# Patient Record
Sex: Female | Born: 1980 | Race: White | Hispanic: No
Health system: Midwestern US, Community
[De-identification: ages and names within clinical notes are randomized; demographics above are authoritative.]

## PROBLEM LIST (undated history)

## (undated) DIAGNOSIS — F329 Major depressive disorder, single episode, unspecified: Secondary | ICD-10-CM

## (undated) DIAGNOSIS — F1910 Other psychoactive substance abuse, uncomplicated: Secondary | ICD-10-CM

## (undated) DIAGNOSIS — F32A Depression, unspecified: Secondary | ICD-10-CM

## (undated) DIAGNOSIS — D649 Anemia, unspecified: Secondary | ICD-10-CM

## (undated) DIAGNOSIS — O034 Incomplete spontaneous abortion without complication: Secondary | ICD-10-CM

## (undated) HISTORY — PX: GASTRIC BYPASS: SHX52

---
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
## 2015-11-20 ENCOUNTER — Observation Stay
Admit: 2015-11-20 | Discharge: 2015-11-21 | Disposition: A | Discharge status: Home / Self Care | Payer: MEDICARE | Attending: Internal Medicine | Admitting: Internal Medicine

## 2015-11-20 ENCOUNTER — Emergency Department: Admit: 2015-11-21 | Payer: MEDICARE

## 2015-11-20 DIAGNOSIS — L03113 Cellulitis of right upper limb: Secondary | ICD-10-CM

## 2015-11-20 NOTE — ED Provider Notes (Signed)
Patient is a 35 y.o. female presenting with skin problem and wrist pain. The history is provided by the patient.   Skin Problem   This is a new problem. The current episode started 2 days ago. The problem occurs constantly. The problem has been gradually worsening. Pertinent negatives include no chest pain, no abdominal pain, no headaches and no shortness of breath. The symptoms are aggravated by bending and twisting. Nothing (not relieved by NSAIDS) relieves the symptoms. The treatment provided no (not improved with NSAIDS) relief.   Wrist Pain    Pertinent negatives include no back pain.        Past Medical History:   Diagnosis Date   ??? Anemia    ??? Fibromyalgia    ??? Lupus ([place])        History reviewed. No pertinent surgical history.      History reviewed. No pertinent family history.    Social History     Social History   ??? Marital status: DIVORCED     Spouse name: N/A   ??? Number of children: N/A   ??? Years of education: N/A     Occupational History   ??? Not on file.     Social History Main Topics   ??? Smoking status: Current Every Day Smoker     Packs/day: 1.00   ??? Smokeless tobacco: Not on file   ??? Alcohol use No   ??? Drug use: No   ??? Sexual activity: Not on file     Other Topics Concern   ??? Not on file     Social History Narrative   ??? No narrative on file         ALLERGIES: Review of patient's allergies indicates no known allergies.    Review of Systems   Constitutional: Negative for chills, fatigue and fever.   HENT: Negative for congestion and sore throat.    Eyes: Negative for photophobia and visual disturbance.   Respiratory: Negative for cough and shortness of breath.    Cardiovascular: Negative for chest pain and palpitations.   Gastrointestinal: Negative for abdominal pain and vomiting.   Endocrine: Negative for cold intolerance and polyuria.   Genitourinary: Negative for dysuria, frequency and hematuria.   Musculoskeletal: Negative for back pain and myalgias.    Skin: Positive for color change. Negative for rash.        Redness , swelling  of right wrist with tenderness   Allergic/Immunologic: Negative for environmental allergies and food allergies.   Neurological: Negative for dizziness, syncope and headaches.   Psychiatric/Behavioral: Negative for hallucinations and suicidal ideas.       Vitals:    11/20/15 1859 11/20/15 1904   BP:  124/87   Pulse:  85   Resp:  18   Temp:  98.1 ??F (36.7 ??C)   Weight: 78.6 kg (173 lb 3.2 oz)    Height: 5' 4"  (1.626 m)             Physical Exam   Constitutional: She is oriented to person, place, and time. She appears well-developed and well-nourished. She appears distressed.   Mild distress due to pain of wrist   HENT:   Head: Normocephalic and atraumatic.   Eyes: Conjunctivae and EOM are normal. Pupils are equal, round, and reactive to light.   Neck: Normal range of motion. Neck supple.   Cardiovascular: Normal rate, regular rhythm and normal heart sounds.    Pulmonary/Chest: Effort normal and breath sounds normal. No respiratory distress. She  has no rales.   Abdominal: Soft. She exhibits no distension. There is no tenderness.   Musculoskeletal: Normal range of motion. She exhibits edema and tenderness.   Swelling of right wrist with tenderness, also involving the forearm.    Right fingers somewhat cold.    Neurological: She is alert and oriented to person, place, and time.   Skin: Skin is warm and dry. There is erythema.        Erythema of wrist and forearm with swelling and tenderness   Psychiatric: She has a normal mood and affect. Thought content normal.   Nursing note and vitals reviewed.       MDM  Number of Diagnoses or Management Options  Cellulitis of hand, right:     ED Course       Xr Wrist Rt Ap/lat/obl Min 3v    Result Date: 11/20/2015  EXAM:   XR Right Wrist Complete, 3 or More Views. CLINICAL HISTORY:   35 years old, female; Right wrist pain , injury TECHNIQUE:   Frontal, lateral  and oblique views of the right wrist. EXAM DATE/TIME:   11/20/2015 8:28 PM COMPARISON:   No relevant prior studies available. FINDINGS:   Bones/joints:  Mild radial articular surface irregularity and sessile dorsal distal radial exostosis.  No acute fracture.  No dislocation.   Soft tissues:  Wrist and dorsal hand soft tissue swelling.  No radiopaque foreign body.     IMPRESSION:       No evidence of acute fracture based on this exam. Wrist and dorsal hand soft tissue contusion. Old healed fracture of the distal radius cannot be excluded.   If there is continuing clinical concern, daytime MR might be helpful.  THIS DOCUMENT HAS BEEN ELECTRONICALLY SIGNED BY ASTI Elroy MD    Recent Results (from the past 24 hour(s))   LACTIC ACID, PLASMA    Collection Time: 11/20/15  7:39 PM   Result Value Ref Range    Lactic acid 0.8 0.4 - 2.0 MMOL/L   METABOLIC PANEL, COMPREHENSIVE    Collection Time: 11/20/15  7:39 PM   Result Value Ref Range    Sodium 139 136 - 145 mmol/L    Potassium 3.5 3.5 - 5.1 mmol/L    Chloride 106 98 - 107 mmol/L    CO2 25 21 - 32 mmol/L    Anion gap 8 8 - 20 mmol/L    Glucose 95 74 - 106 mg/dL    BUN 8 7 - 18 mg/dL    Creatinine 0.84 0.55 - 1.02 mg/dL    GFR est AA >60 >60 ml/min/1.17m    GFR est non-AA >60 >60 ml/min/1.781m   Calcium 8.5 8.5 - 10.1 mg/dL    Bilirubin, total 0.4 0.2 - 1.0 mg/dL    ALT (SGPT) 23 12 - 78 U/L    AST (SGOT) 22 15 - 37 U/L    Alk. phosphatase 81 46 - 116 U/L    Protein, total 7.4 6.4 - 8.2 g/dL    Albumin 3.4 3.4 - 5.0 g/dL    Globulin 4.0 2.5 - 5.0 g/dL    A-G Ratio 0.9 (L) 1.0 - 1.5     CBC WITH AUTOMATED DIFF    Collection Time: 11/20/15  7:39 PM   Result Value Ref Range    WBC 9.6 4.8 - 10.8 K/uL    RBC 3.88 (L) 4.20 - 5.40 M/uL    HGB 11.9 (L) 12.0 - 16.0 g/dL    HCT 34.5 (L)  37.0 - 47.0 %    MCV 88.9 81.0 - 100.0 FL    MCH 30.7 27.0 - 31.0 PG    MCHC 34.5 30.5 - 36.0 g/dL    RDW 12.6 11.4 - 14.6 %    PLATELET 351 122 - 400 K/uL    MPV 9.2 (L) 10.2 - 12.7 FL     NEUTROPHILS 64 42.2 - 75.2 %    LYMPHOCYTES 24 20.5 - 51.1 %    MONOCYTES 9 1.7 - 10.0 %    EOSINOPHILS 3 (H) 0.0 - 2.0 %    BASOPHILS 0 0.0 - 1.0 %    ABS. NEUTROPHILS 6.0 2.0 - 8.1 K/UL    ABS. LYMPHOCYTES 2.3 1.0 - 5.5 K/UL    ABS. MONOCYTES 0.9 0.1 - 1.0 K/UL    ABS. EOSINOPHILS 0.3 (H) 0.0 - 0.2 K/UL    ABS. BASOPHILS 0.0 0.0 - 0.1 K/UL    DF AUTOMATED     HCG QL SERUM    Collection Time: 11/20/15  7:39 PM   Result Value Ref Range    HCG, Ql. NEGATIVE  NEG     CK    Collection Time: 11/20/15  7:39 PM   Result Value Ref Range    CK 61 26 - 192 U/L       <EMERGENCY DEPARTMENT CASE SUMMARY>    Impression/Differential Diagnosis: Possible cellulitis . Plan for IV antibiotics. Check  Venous doppler    Final Impression/Diagnosis:  Pending    Patient condition at time of disposition:  Fair    Disposition: Signed out to Dr. Sheran Fava.      I have reviewed the following home medications:    Prior to Admission medications    Medication Sig Start Date End Date Taking? Authorizing Provider   FLUoxetine (PROZAC) 40 mg capsule Take 40 mg by mouth daily.   Yes Phys Other, MD   ferrous sulfate (IRON) 325 mg (65 mg iron) tablet Take  by mouth Daily (before breakfast).   Yes Phys Other, MD   mirtazapine (REMERON) 30 mg tablet Take  by mouth nightly.   Yes Phys Other, MD   clonazePAM (KLONOPIN) 2 mg tablet Take 2 mg by mouth two (2) times a day.   Yes Phys Other, MD         Georg Ruddle, MD    Procedures

## 2015-11-20 NOTE — ED Triage Notes (Signed)
Patient present with swollen, warm, red right upper extremity, pain/swelling/redness mostly to right wrist. Only trauma she can think of us was Thursday she slammed it in a car door. Has some dog scratches to that area. Noticed it this morning. Last took Aleve 2 hours PTA

## 2015-11-20 NOTE — Other (Addendum)
TRANSFER - IN REPORT:    Verbal report received from Golden HurterElizabeth Jahn RN (name) on Rozelle LoganLeah Marie Tashiro  being received from ED (unit) for routine progression of care      Report consisted of patient???s Situation, Background, Assessment and   Recommendations(SBAR).     Information from the following report(s) SBAR, ED Summary and Recent Results was reviewed with the receiving nurse.    Opportunity for questions and clarification was provided.

## 2015-11-20 NOTE — ED Notes (Signed)
TRANSFER - OUT REPORT:    Verbal report given to Randi Zucchino RN(name) on Erin Robinson  being transferred to MedSurg(unit) for routine progression of care       Report consisted of patient???s Situation, Background, Assessment and   Recommendations(SBAR).     Information from the following report(s) SBAR, Kardex, ED Summary, Intake/Output, MAR, Recent Results and Med Rec Status was reviewed with the receiving nurse.    Lines:   Peripheral IV 11/20/15 Left Antecubital (Active)   Site Assessment Clean, dry, & intact 11/20/2015  7:21 PM   Phlebitis Assessment 0 11/20/2015  7:21 PM   Infiltration Assessment 0 11/20/2015  7:21 PM   Dressing Status Clean, dry, & intact 11/20/2015  7:21 PM   Dressing Type Transparent 11/20/2015  7:21 PM   Hub Color/Line Status Pink 11/20/2015  7:21 PM   Action Taken Blood drawn 11/20/2015  7:21 PM        Opportunity for questions and clarification was provided.      Patient transported with:   [employer]

## 2015-11-20 NOTE — ED Notes (Signed)
Discussed with Dr. Melvern SampleHmidi for admission for IV antibiotics for cellulitis hand.  She is currently receiving dose if IV unasyn.  Plain films and ultrasound are negative.

## 2015-11-20 NOTE — H&P (Signed)
[facility]  [facility]    History and Physical Examination      NAME:  Erin LoganLeah Marie Robinson   DOB:   1981-03-09   MRN:   [id number]     Date/Time:  11/20/2015  ________________________________________________________    Chief Complaint:  Skin Problem and Wrist Pain  .  Primary Care Provider: None MD  History of Present Illness:  This is a 35 y.o. year old female with PMHx of Lupus (not on any meds), fibromyalgia, smoker, who presented with c/o of right hand and wrist pain swelling and erythema that progressively worsened over the last couple of days.  She states she banged her Rt hand on the door 2days ago, had mild pain, however this morning woke up with it swollen, the pain worsened and had decreased range of motion. Denies f/c/s. No n/v/d. No CP or SOB. Has some superficial scratch in that area from her dog. Denies being bit.      Past Medical History:   has a past medical history of Anemia; Fibromyalgia; and Lupus ([facility]).   Surgical History:  History reviewed. No pertinent surgical history.   Social History:  The patient  reports that she has been smoking.  She has been smoking about 1.00 pack per day. She does not have any smokeless tobacco history on file. She reports that she does not drink alcohol or use illicit drugs.   Family History:  family history is not on file.    Home Medications:  Prior to Admission Medications   Prescriptions Last Dose Informant Patient Reported? Taking?   FLUoxetine (PROZAC) 40 mg capsule 11/20/2015 at Unknown time  Yes Yes   Sig: Take 40 mg by mouth daily.   clonazePAM (KLONOPIN) 2 mg tablet 11/20/2015 at Unknown time  Yes Yes   Sig: Take 2 mg by mouth two (2) times a day.   ferrous sulfate (IRON) 325 mg (65 mg iron) tablet 11/20/2015 at Unknown time  Yes Yes   Sig: Take  by mouth Daily (before breakfast).   mirtazapine (REMERON) 30 mg tablet 11/20/2015 at Unknown time  Yes Yes   Sig: Take  by mouth nightly.       Facility-Administered Medications: None       Review of Systems:  Constitutional:  No fever, fatigue, or night sweats.   HEENT:  No vision changes or headaches. No hearing loss  Respiratory:   No cough, no audible wheeze, respirations regular.  Cardiovascular:  No palpitations.  Gastrointestinal:  No vomiting, diarrhea or constipation.   Genitourinary:  No dysuria or hematuria.  Metabolic/Endocrine:  No polyuria, polydypsia, or polyphagia. No cold/ heat intolerance.  Musculoskeletal: Rt had and rist swelling tender and erythema.  Neuro/Psychiatric:  No dizziness, no emotional disturbances.          Physical Examination:       General alert, well appearing, and in no distress    Vital signs  Blood pressure 124/87, pulse 85, temperature 98.1 ??F (36.7 ??C), resp. rate 18, height 5\' 4"  (1.626 m), weight 78.6 kg (173 lb 3.2 oz), last menstrual period 11/02/2015.    Mental status alert, oriented to person, place, and time    Neck supple, no significant adenopathy    Chest clear to auscultation, no wheezes, rales or rhonchi, symmetric air entry    Heart normal rate, regular rhythm, normal S1, S2, no murmurs, rubs, clicks or gallops    Abdomen soft, nontender, nondistended, no masses or organomegaly  Neurological alert, oriented, normal speech, no focal findings or movement disorder noted    Extremity Rt had and wrist swelling tender and erythema.  peripheral pulses normal, no pedal edema, no clubbing or cyanosis    Skin normal coloration and turgor, no rashes, no suspicious skin lesions noted        LABS    Labs: Results:       Chemistry Recent Labs      11/20/15   1939   GLU  95   NA  139   K  3.5   CL  106   CO2  25   BUN  8   CREA  0.84   CA  8.5   AGAP  8   AP  81   TP  7.4   ALB  3.4   GLOB  4.0   AGRAT  0.9*      CBC w/Diff Recent Labs      11/20/15   1939   WBC  9.6   RBC  3.88*   HGB  11.9*   HCT  34.5*   PLT  351   GRANS  64   LYMPH  24   EOS  3*      Cardiac Enzymes Recent Labs      11/20/15   1939   CPK  61       Coagulation No results for input(s): PTP, INR, APTT in the last 72 hours.    No lab exists for component: INREXT    Liver Enzymes Recent Labs      11/20/15   1939   TP  7.4   ALB  3.4   AP  81   SGOT  22      Urine Analysis No results found for: COLOR, APPRN, REFSG, PHU, PROTU, KETU, BILU, BLDU, UROU, NITU, LEUKU   BNP No results for input(s): BNPP in the last 72 hours.             imaging:      Results from Hospital Encounter encounter on 11/20/15   XR WRIST RT AP/LAT/OBL MIN 3V   Narrative EXAM:   XR Right Wrist Complete, 3 or More Views.    CLINICAL HISTORY:   35 years old, female; Right wrist pain , injury    TECHNIQUE:   Frontal, lateral and oblique views of the right wrist.    EXAM DATE/TIME:   11/20/2015 8:28 PM    COMPARISON:   No relevant prior studies available.    FINDINGS:   Bones/joints:  Mild radial articular surface irregularity and sessile dorsal   distal radial exostosis.  No acute fracture.  No dislocation.   Soft tissues:  Wrist and dorsal hand soft tissue swelling.  No radiopaque   foreign body.         Impression IMPRESSION:          No evidence of acute fracture based on this exam.    Wrist and dorsal hand soft tissue contusion.    Old healed fracture of the distal radius cannot be excluded.   If there is   continuing clinical concern, daytime MR might be helpful.            THIS DOCUMENT HAS BEEN ELECTRONICALLY SIGNED BY ASTI PILIKA MD        Assessment:    This is a 35 y.o. year old female with PMHx of Lupus (not on any meds), fibromyalgia, smoker,admitted for:  1. Right hand and wrist  cellulitis.   2. Current tobacco use.  3. SLE/Fibromyalgia.  4. Anxiety.    Plan: Admit to med/surg.  NS at 100 cc/h.  BC x2.  Unasyn 3 gm if Q4.  Nicoderm 21 mg/d.  Strongly urged to quit and d/w pt the health risks associated with tobacco use.   Restart home meds.  DVT prophylaxis.            Prophylaxis:  Lovenox  Coumadin  Hep SQ  SCD???s  H2B/PPI      Care Plan discussed with:     Patient   Family    Care Manager    Nursing   Consultant/Specialist :      Disposition:  Home w/ Family   HH PT,OT,RN   SNF/LTC   [facility]/Rehab        Dede Query, MD  November 20, 2015

## 2015-11-21 LAB — METABOLIC PANEL, COMPREHENSIVE
A-G Ratio: 0.9 — ABNORMAL LOW (ref 1.0–1.5)
ALT (SGPT): 23 U/L (ref 12–78)
AST (SGOT): 22 U/L (ref 15–37)
Albumin: 3.4 g/dL (ref 3.4–5.0)
Alk. phosphatase: 81 U/L (ref 46–116)
Anion gap: 8 mmol/L (ref 8–20)
BUN: 8 mg/dL (ref 7–18)
Bilirubin, total: 0.4 mg/dL (ref 0.2–1.0)
CO2: 25 mmol/L (ref 21–32)
Calcium: 8.5 mg/dL (ref 8.5–10.1)
Chloride: 106 mmol/L (ref 98–107)
Creatinine: 0.84 mg/dL (ref 0.55–1.02)
GFR est AA: >60 mL/min/{1.73_m2} (ref >60)
GFR est non-AA: >60 mL/min/{1.73_m2} (ref >60)
Globulin: 4 g/dL (ref 2.5–5.0)
Glucose: 95 mg/dL (ref 74–106)
Potassium: 3.5 mmol/L (ref 3.5–5.1)
Protein, total: 7.4 g/dL (ref 6.4–8.2)
Sodium: 139 mmol/L (ref 136–145)

## 2015-11-21 LAB — CBC WITH AUTOMATED DIFF
ABS. BASOPHILS: 0 10*3/uL (ref 0.0–0.1)
ABS. EOSINOPHILS: 0.3 10*3/uL — ABNORMAL HIGH (ref 0.0–0.2)
ABS. EOSINOPHILS: 0.3 10*3/uL
ABS. LYMPHOCYTES: 2.3 10*3/uL (ref 1.0–5.5)
ABS. LYMPHOCYTES: 2.2 10*3/uL
ABS. MONOCYTES: 0.9 10*3/uL (ref 0.1–1.0)
ABS. MONOCYTES: 0.5 10*3/uL
ABS. NEUTROPHILS: 6 10*3/uL (ref 2.0–8.1)
ABS. NEUTROPHILS: 3.6 10*3/uL
ATYPICAL LYMPHS: 2 %
BASOPHILS: 0 % (ref 0.0–1.0)
EOSINOPHILS: 3 % — ABNORMAL HIGH (ref 0.0–2.0)
EOSINOPHILS: 4 % — ABNORMAL HIGH (ref 0–2)
HCT: 34.5 % — ABNORMAL LOW (ref 37.0–47.0)
HCT: 31 % — ABNORMAL LOW (ref 37.0–47.0)
HGB: 11.9 g/dL — ABNORMAL LOW (ref 12.0–16.0)
HGB: 10.5 g/dL — ABNORMAL LOW (ref 12.0–16.0)
LYMPHOCYTES: 24 % (ref 20.5–51.1)
LYMPHOCYTES: 32 % (ref 21–51)
MCH: 30.7 PG (ref 27.0–31.0)
MCH: 30.5 PG (ref 27.0–31.0)
MCHC: 34.5 g/dL (ref 30.5–36.0)
MCHC: 33.9 g/dL (ref 30.5–36.0)
MCV: 88.9 FL (ref 81.0–100.0)
MCV: 90.1 FL (ref 81.0–100.0)
MONOCYTES: 9 % (ref 1.7–10.0)
MONOCYTES: 7 % (ref 2–9)
MPV: 9.2 FL — ABNORMAL LOW (ref 10.2–12.7)
MPV: 8.9 FL — ABNORMAL LOW (ref 10.2–12.7)
NEUTROPHILS: 64 % (ref 42.2–75.2)
NEUTROPHILS: 55 % (ref 42–75)
PLATELET ESTIMATE: ADEQUATE
PLATELET: 351 10*3/uL (ref 122–400)
PLATELET: 311 10*3/uL (ref 122–400)
RBC: 3.88 M/uL — ABNORMAL LOW (ref 4.20–5.40)
RBC: 3.44 M/uL — ABNORMAL LOW (ref 4.20–5.40)
RDW: 12.6 % (ref 11.4–14.6)
RDW: 12 % (ref 11.4–14.6)
WBC: 9.6 10*3/uL (ref 4.8–10.8)
WBC: 6.6 10*3/uL (ref 4.8–10.8)

## 2015-11-21 LAB — URINALYSIS W/ RFLX MICROSCOPIC
Bilirubin: NEGATIVE
Blood: NEGATIVE
Glucose: NEGATIVE mg/dL
Ketone: NEGATIVE mg/dL
Leukocyte Esterase: NEGATIVE
Nitrites: NEGATIVE
Protein: NEGATIVE mg/dL
Specific gravity: 1.015 (ref 1.005–1.030)
Urobilinogen: 0.2 EU/dL (ref 0.1–1.0)
pH (UA): 6.5 (ref 4.5–8.0)

## 2015-11-21 LAB — HCG QL SERUM: HCG, Ql.: NEGATIVE

## 2015-11-21 LAB — LACTIC ACID
Lactic acid: 0.8 MMOL/L (ref 0.4–2.0)
Lactic acid: 1.1 MMOL/L (ref 0.4–2.0)

## 2015-11-21 LAB — CK: CK: 61 U/L (ref 26–192)

## 2015-11-21 MED ORDER — KETOROLAC TROMETHAMINE 30 MG/ML INJECTION
30 mg/mL (1 mL) | INTRAMUSCULAR | Status: AC
Start: 2015-11-21 — End: 2015-11-20
  Administered 2015-11-21: via INTRAVENOUS

## 2015-11-21 MED ORDER — MIRTAZAPINE 30 MG TAB
30 mg | Freq: Every evening | ORAL | Status: DC
Start: 2015-11-21 — End: 2015-11-21
  Administered 2015-11-21: 05:00:00 via ORAL

## 2015-11-21 MED ORDER — HYDROMORPHONE (PF) 1 MG/ML IJ SOLN
1 mg/mL | Freq: Four times a day (QID) | INTRAMUSCULAR | Status: DC | PRN
Start: 2015-11-21 — End: 2015-11-21
  Administered 2015-11-21 (×2): via INTRAVENOUS

## 2015-11-21 MED ORDER — CLONAZEPAM 1 MG TAB
1 mg | ORAL | Status: AC
Start: 2015-11-21 — End: 2015-11-21
  Administered 2015-11-21: 05:00:00 via ORAL

## 2015-11-21 MED ORDER — NICOTINE 21 MG/24 HR DAILY PATCH
21 mg/24 hr | TRANSDERMAL | Status: DC
Start: 2015-11-21 — End: 2015-11-21

## 2015-11-21 MED ORDER — SODIUM CHLORIDE 0.9 % IV
INTRAVENOUS | Status: DC
Start: 2015-11-21 — End: 2015-11-21
  Administered 2015-11-21: 04:00:00 via INTRAVENOUS

## 2015-11-21 MED ORDER — CLONAZEPAM 1 MG TAB
1 mg | Freq: Two times a day (BID) | ORAL | Status: DC
Start: 2015-11-21 — End: 2015-11-21
  Administered 2015-11-21: 14:00:00 via ORAL

## 2015-11-21 MED ORDER — FLUOXETINE 20 MG CAP
20 mg | Freq: Every day | ORAL | Status: DC
Start: 2015-11-21 — End: 2015-11-21
  Administered 2015-11-21: 14:00:00 via ORAL

## 2015-11-21 MED ORDER — FERROUS SULFATE 325 MG (65 MG ELEMENTAL IRON) TAB
325 mg (65 mg iron) | Freq: Three times a day (TID) | ORAL | Status: DC
Start: 2015-11-21 — End: 2015-11-21
  Administered 2015-11-21: 14:00:00 via ORAL

## 2015-11-21 MED ORDER — SODIUM CHLORIDE 0.9 % IV PIGGY BACK
3 gram | INTRAVENOUS | Status: AC
Start: 2015-11-21 — End: 2015-11-20
  Administered 2015-11-21: 01:00:00 via INTRAVENOUS

## 2015-11-21 MED ORDER — OXYCODONE-ACETAMINOPHEN 5 MG-325 MG TAB
5-325 mg | ORAL | Status: AC
Start: 2015-11-21 — End: 2015-11-20
  Administered 2015-11-21: 02:00:00 via ORAL

## 2015-11-21 MED ORDER — AMPICILLIN-SULBACTAM 3 GRAM SOLUTION FOR INJECTION
3 gram | Freq: Four times a day (QID) | INTRAMUSCULAR | Status: DC
Start: 2015-11-21 — End: 2015-11-21
  Administered 2015-11-21 (×2): via INTRAVENOUS

## 2015-11-21 MED ORDER — KETOROLAC TROMETHAMINE 15 MG/ML INJECTION
15 mg/mL | Freq: Four times a day (QID) | INTRAMUSCULAR | Status: DC | PRN
Start: 2015-11-21 — End: 2015-11-21
  Administered 2015-11-21: 14:00:00 via INTRAVENOUS

## 2015-11-21 MED ORDER — SODIUM CHLORIDE 0.9% BOLUS IV
0.9 % | Freq: Once | INTRAVENOUS | Status: AC
Start: 2015-11-21 — End: 2015-11-20
  Administered 2015-11-21: 01:00:00 via INTRAVENOUS

## 2015-11-21 MED ORDER — NICOTINE 21 MG/24 HR DAILY PATCH
21 mg/24 hr | Freq: Every day | TRANSDERMAL | Status: DC
Start: 2015-11-21 — End: 2015-11-21

## 2015-11-21 MED ORDER — AMOXICILLIN CLAVULANATE 875 MG-125 MG TAB
875-125 mg | ORAL_TABLET | Freq: Two times a day (BID) | ORAL | 0 refills | Status: AC
Start: 2015-11-21 — End: 2015-11-28

## 2015-11-21 MED ORDER — ENOXAPARIN 40 MG/0.4 ML SUB-Q SYRINGE
40 mg/0.4 mL | SUBCUTANEOUS | Status: DC
Start: 2015-11-21 — End: 2015-11-21

## 2015-11-21 MED ORDER — AMOXICILLIN CLAVULANATE 875 MG-125 MG TAB
875-125 mg | ORAL_TABLET | Freq: Two times a day (BID) | ORAL | 0 refills | Status: DC
Start: 2015-11-21 — End: 2015-11-21

## 2015-11-21 MED ORDER — ACETAMINOPHEN 325 MG TABLET
325 mg | ORAL | Status: DC | PRN
Start: 2015-11-21 — End: 2015-11-21

## 2015-11-21 MED FILL — OXYCODONE-ACETAMINOPHEN 5 MG-325 MG TAB: 5-325 mg | ORAL | Qty: 1

## 2015-11-21 MED FILL — HYDROMORPHONE (PF) 1 MG/ML IJ SOLN: 1 mg/mL | INTRAMUSCULAR | Qty: 1

## 2015-11-21 MED FILL — KETOROLAC TROMETHAMINE 15 MG/ML INJECTION: 15 mg/mL | INTRAMUSCULAR | Qty: 1

## 2015-11-21 MED FILL — NICOTINE 21 MG/24 HR DAILY PATCH: 21 mg/24 hr | TRANSDERMAL | Qty: 1

## 2015-11-21 MED FILL — KETOROLAC TROMETHAMINE 30 MG/ML INJECTION: 30 mg/mL (1 mL) | INTRAMUSCULAR | Qty: 1

## 2015-11-21 MED FILL — AMPICILLIN-SULBACTAM 3 GRAM SOLUTION FOR INJECTION: 3 gram | INTRAMUSCULAR | Qty: 1

## 2015-11-21 MED FILL — SODIUM CHLORIDE 0.9 % IV: INTRAVENOUS | Qty: 1000

## 2015-11-21 MED FILL — FLUOXETINE 20 MG CAP: 20 mg | ORAL | Qty: 2

## 2015-11-21 MED FILL — CLONAZEPAM 1 MG TAB: 1 mg | ORAL | Qty: 2

## 2015-11-21 MED FILL — FERROUS SULFATE 325 MG (65 MG ELEMENTAL IRON) TAB: 325 mg (65 mg iron) | ORAL | Qty: 1

## 2015-11-21 MED FILL — MIRTAZAPINE 30 MG TAB: 30 mg | ORAL | Qty: 1

## 2015-11-21 MED FILL — TYLENOL 325 MG TABLET: 325 mg | ORAL | Qty: 2

## 2015-11-21 NOTE — Progress Notes (Signed)
35 year old female admitted for cellulitis of hand.  She is independent in ADL's and ambulation.  I met with patient bedside, she is being discharged home, being changed to observation status.  I explained observation status and observation form signed.  She has family support.  No discharge needs.  Discharge plan is home with out patietn MD follow up.

## 2015-11-21 NOTE — Progress Notes (Signed)
Went to discuss antibiotic treatment for discharge as discussed discharge prior and wanted to confirm pharmacy.  Patient is irate and belligerent demanding pain medication stating NSAIDs wont work and she wants opioids.  Patient screaming and yelling.  Patient told she will not be getting pain medications and she needs to follow up with her PCP as this is not an inpatient admission or workup. Patient exhibiting pain medication seeking behavior.     Saidy Ormand F. Ibrahima Holberg, D.O  Electronic Signature  Hospitalist Attending  11/21/15  9:31 AM

## 2015-11-21 NOTE — Progress Notes (Signed)
Patient d/c to home. IV removed armband shredded. Reviewed all d/c instructions with patient. Verbalized understanding. All questions were answered. Left with all belongings with assistance.

## 2015-11-21 NOTE — Discharge Summary (Addendum)
Discharge Summary     Patient: Erin Robinson MRN: [id number]  [id number]    Date of Birth: November 03, 1980  Age: 35 y.o.  Sex: female      Less than 30 mins spent on this discharge.      Admit Date: 11/20/2015    Discharge Date: No discharge date for patient encounter.      Admission Diagnoses:  Principal Diagnosis <principal problem not specified>  Cellulitis of right hand  Cellulitis of right hand    Discharge Diagnoses:   Problem List as of 11/21/2015  Never Reviewed          Codes Class Noted - Resolved    Cellulitis of right hand ICD-10-CM: L03.113  ICD-9-CM: 682.4  11/20/2015 - Present               Admission Condition: Poor    Discharge Condition: Stable    Reason for Admission: wrist pain    Hospital Course: Admitted for wrist pain found to have cellulitis.  No leukocytosis, no fever, and no outpatient treatment.  Patient can be switched over to oral antibiotics and monitored as outpatient.  Patient also requesting opioids (oral or IV) "for just while she is here." Patient redness and erythema much improved.  Patient seen and examined on day of discharge and deemed medically stable for discharge at this time.        Physical Examination:   Vital signs  Blood pressure 109/76, pulse 94, temperature 98.2 ??F (36.8 ??C), resp. rate 20, height  (1.626 m), weight 78.6 kg (173 lb 3.2 oz), last menstrual period 11/02/2015, SpO2 100 %, not currently breastfeeding.      General alert, well appearing, and in no distress      Mental status alert, oriented to person, place, and time    Neck supple, no significant adenopathy    Chest clear to auscultation, no wheezes, rales or rhonchi, symmetric air entry    Heart normal rate, regular rhythm, normal S1, S2, no murmurs, rubs, clicks or gallops    Abdomen soft, nontender, nondistended, no masses or organomegaly    Neurological alert, oriented, normal speech, no focal findings or movement disorder noted    Extremity peripheral pulses normal, no pedal edema, no clubbing or  cyanosis    Skin normal coloration and turgor, no rashes, no suspicious skin lesions noted     Procedures/Surgeries: * No surgery found *    Consults: None    Disposition:  @     LABS    Labs: Results:       Chemistry Recent Labs      11/20/15   1939   GLU  95   NA  139   K  3.5   CL  106   CO2  25   BUN  8   CREA  0.84   CA  8.5   AGAP  8   AP  81   TP  7.4   ALB  3.4   GLOB  4.0   AGRAT  0.9*      CBC w/Diff Recent Labs      11/21/15   0555  11/20/15   1939   WBC  6.6  9.6   RBC  3.44*  3.88*   HGB  10.5*  11.9*   HCT  31.0*  34.5*   PLT  311  351   GRANS  55  64   LYMPH  32  24   EOS  4*  3*      Cardiac Enzymes Recent Labs      11/20/15   1939   CPK  61      Coagulation No results for input(s): PTP, INR, APTT in the last 72 hours.    No lab exists for component: INREXT, INREXT    Liver Enzymes Recent Labs      11/20/15   1939   TP  7.4   ALB  3.4   AP  81   SGOT  22      Urine Analysis Color   Date Value Ref Range Status   11/20/2015 YELLOW YEL   Final     Appearance   Date Value Ref Range Status   11/20/2015 CLEAR CLEAR   Final     pH (UA)   Date Value Ref Range Status   11/20/2015 6.5 4.5 - 8.0   Final     Protein   Date Value Ref Range Status   11/20/2015 NEGATIVE  NEG mg/dL Final     Ketone   Date Value Ref Range Status   11/20/2015 NEGATIVE  NEG mg/dL Final     Bilirubin   Date Value Ref Range Status   11/20/2015 NEGATIVE  NEG   Final     Blood   Date Value Ref Range Status   11/20/2015 NEGATIVE  NEG   Final     Urobilinogen   Date Value Ref Range Status   11/20/2015 0.2 0.1 - 1.0 EU/dL Final     Nitrites   Date Value Ref Range Status   11/20/2015 NEGATIVE  NEG   Final     Leukocyte Esterase   Date Value Ref Range Status   11/20/2015 NEGATIVE  NEG   Final      BNP No results for input(s): BNPP in the last 72 hours.         Imaging:    Xr Wrist Rt Ap/lat/obl Min 3v    Result Date: 11/20/2015  EXAM:   XR Right Wrist Complete, 3 or More Views. CLINICAL HISTORY:   35  years old, female; Right wrist pain , injury TECHNIQUE:   Frontal, lateral and oblique views of the right wrist. EXAM DATE/TIME:   11/20/2015 8:28 PM COMPARISON:   No relevant prior studies available. FINDINGS:   Bones/joints:  Mild radial articular surface irregularity and sessile dorsal distal radial exostosis.  No acute fracture.  No dislocation.   Soft tissues:  Wrist and dorsal hand soft tissue swelling.  No radiopaque foreign body.     IMPRESSION:       No evidence of acute fracture based on this exam. Wrist and dorsal hand soft tissue contusion. Old healed fracture of the distal radius cannot be excluded.   If there is continuing clinical concern, daytime MR might be helpful.  THIS DOCUMENT HAS BEEN ELECTRONICALLY SIGNED BY ASTI PILIKA MD    Duplex Upper Ext Venous Right    Result Date: 11/20/2015  EXAM:   US Duplex Right Upper Extremity Veins. CLINICAL HISTORY:   35 years old, female; Arm swelling, dvt suspected TECHNIQUE:   Real-time ultrasound scan of the veins of the right upper extremity with color  Doppler flow, spectral waveform analysis and compression. EXAM DATE/TIME:   11/20/2015 7:43 PM COMPARISON:   No relevant prior studies available. FINDINGS:   Deep veins:  Unremarkable.  No DVT in the internal jugular, subclavian, axillary, or brachial veins.   Superficial veins:  Unremarkable.  No thrombus in  the visualized superficial veins.   Soft tissues:  Prominent forearm soft tissue edema.     IMPRESSION:       No evidence of DVT or SVT. Forearm cellulitis cannot be excluded. Clinical correlation suggested. THIS DOCUMENT HAS BEEN ELECTRONICALLY SIGNED BY ASTI PILIKA MD      Discharge Medications:   Current Discharge Medication List      START taking these medications    Details   amoxicillin-clavulanate (AUGMENTIN) 875-125 mg per tablet Take 1 Tab by mouth two (2) times a day for 7 days.  Qty: 14 Tab, Refills: 0         CONTINUE these medications which have NOT CHANGED    Details    FLUoxetine (PROZAC) 40 mg capsule Take 40 mg by mouth daily.      ferrous sulfate (IRON) 325 mg (65 mg iron) tablet Take 325 mg by mouth Daily (before breakfast).      mirtazapine (REMERON) 30 mg tablet Take 30 mg by mouth nightly.      clonazePAM (KLONOPIN) 2 mg tablet Take 2 mg by mouth two (2) times a day.             Activity: Activity as tolerated  Diet: Regular Diet    Follow-up Information     Follow up With Details Comments Contact Info    [facility]   [address] [place] [address]  [phone]    None   None [phone]) Patient stated that they have no PCP            Izzac Rockett F. Cailie Bosshart, D.O  Electronic Signature  Hospitalist Attending  11/21/15

## 2015-11-21 NOTE — Progress Notes (Addendum)
2147: Pt received from ER AAO x 3.  Lungs clear.  Right hand swollen area outlined, pt able to move fingers but with difficulty, positive capillary refill, positive brachial and radial pulses.  Right hand elevated extremity on pillow in bed. Oriented to room and unit.      0013: Pt complaint pain to right hand - Dr Melvern SampleHmidi aware - PRN Dilaudid 0.5mg  ordered and given with ice pack to hand - with relief.

## 2015-11-21 NOTE — Progress Notes (Addendum)
Received in bed alert and oriented x4. No sob noted. No s/s distress. Respirations even and unlabored.Reports mild pain  to right hand. Ice applied. Neurovascular check WNL. Cell bell within reach-will continue to monitor.  [id number]- Patient requesting opiates for pain. MD aware. Educated patient on non opiate measures to reduce pain. Verbalized understanding.Cell bell within reach-will continue to monitor.

## 2015-11-26 LAB — CULTURE, BLOOD
Culture result:: NO GROWTH
Culture result:: NO GROWTH

## 2016-05-12 ENCOUNTER — Inpatient Hospital Stay
Admit: 2016-05-12 | Discharge: 2016-05-12 | Disposition: A | Discharge status: Home / Self Care | Payer: MEDICARE | Attending: Emergency Medicine

## 2016-05-12 DIAGNOSIS — A6000 Herpesviral infection of urogenital system, unspecified: Secondary | ICD-10-CM

## 2016-05-12 MED ORDER — CEPHALEXIN 500 MG CAP
500 mg | ORAL_CAPSULE | Freq: Four times a day (QID) | ORAL | 0 refills | Status: AC
Start: 2016-05-12 — End: 2016-05-22

## 2016-05-12 MED ORDER — OMEPRAZOLE 10 MG CAP, DELAYED RELEASE
10 mg | ORAL_CAPSULE | Freq: Every day | ORAL | 0 refills | Status: AC
Start: 2016-05-12 — End: 2016-06-01

## 2016-05-12 MED ORDER — VALACYCLOVIR 1 G TAB
1 gram | ORAL_TABLET | Freq: Three times a day (TID) | ORAL | 0 refills | Status: DC
Start: 2016-05-12 — End: 2016-05-12

## 2016-05-12 MED ORDER — CEPHALEXIN 500 MG CAP
500 mg | ORAL_CAPSULE | Freq: Four times a day (QID) | ORAL | 0 refills | Status: DC
Start: 2016-05-12 — End: 2016-05-12

## 2016-05-12 MED ORDER — CEPHALEXIN 500 MG CAP
500 mg | ORAL | Status: AC
Start: 2016-05-12 — End: 2016-05-12
  Administered 2016-05-12: 19:00:00 via ORAL

## 2016-05-12 MED ORDER — VALACYCLOVIR 1 G TAB
1 gram | ORAL_TABLET | Freq: Three times a day (TID) | ORAL | 0 refills | Status: DC
Start: 2016-05-12 — End: 2016-09-15

## 2016-05-12 MED ORDER — ACYCLOVIR 800 MG TAB
800 mg | Freq: Once | ORAL | Status: AC
Start: 2016-05-12 — End: 2016-05-12
  Administered 2016-05-12: 19:00:00 via ORAL

## 2016-05-12 MED FILL — ACYCLOVIR 800 MG TAB: 800 mg | ORAL | Qty: 1

## 2016-05-12 MED FILL — CEPHALEXIN 500 MG CAP: 500 mg | ORAL | Qty: 1

## 2016-05-12 NOTE — ED Notes (Signed)
Patient is awake, alert, and oriented, speech is clear and patient is able to ambulate  and ready for discharge. Verbal and written discharge instructions provided and has the cognitive understanding of discharge instructions. Discharged home with friend. All questions answered.

## 2016-05-12 NOTE — ED Triage Notes (Signed)
PT awoke 2 days ago with bumps on her perineum.  Pt c/o of burning and pain especially when urinating.

## 2016-05-12 NOTE — ED Notes (Signed)
Visual exam of vulva and labia with this RN in attendance.  Papules noted labia

## 2016-05-12 NOTE — ED Notes (Signed)
5:50 PM  Called phone and left message to call back regarding  + HSV cultures.  Patient is already on Valtrex and is to FU with obgyn.  She is aware of pending culture.

## 2016-05-12 NOTE — ED Notes (Signed)
6:35 PM  Second call to patient message left mail box is full. Will send letter.

## 2016-05-12 NOTE — ED Provider Notes (Signed)
Patient is a 35 y.o. female presenting with female genitourinary complaint. The history is provided by the patient.   Vaginal Pain   Primary symptoms include discharge.  Primary symptoms include no pelvic pain, no dyspareunia, no genital lesions, no genital pain, no genital rash, no genital itching, no genital odor, no dysuria, and no vaginal bleeding. There has been no fever. This is a new problem. The current episode started 2 days ago. The problem occurs constantly. The problem has been gradually worsening.        Past Medical History:   Diagnosis Date   ??? Anemia    ??? Fibromyalgia    ??? Lupus (HCC)    ??? Psychiatric disorder     anxiety, depression       Past Surgical History:   Procedure Laterality Date   ??? HX GYN      C-section 2006         History reviewed. No pertinent family history.    Social History     Social History   ??? Marital status: DIVORCED     Spouse name: N/A   ??? Number of children: N/A   ??? Years of education: N/A     Occupational History   ??? Not on file.     Social History Main Topics   ??? Smoking status: Current Every Day Smoker     Packs/day: 1.50   ??? Smokeless tobacco: Never Used   ??? Alcohol use No   ??? Drug use: No   ??? Sexual activity: Not on file     Other Topics Concern   ??? Not on file     Social History Narrative         ALLERGIES: Review of patient's allergies indicates no known allergies.    Review of Systems   Constitutional: Negative for activity change and appetite change.   Genitourinary: Negative for dyspareunia, dysuria, pelvic pain and vaginal bleeding.   Musculoskeletal: Negative for back pain, gait problem and joint swelling.   Skin: Positive for rash and wound. Negative for color change and pallor.       Vitals:    05/12/16 1406 05/12/16 1410   BP: 106/72    Pulse: 100    Resp: 18    Temp: 98.1 ??F (36.7 ??C)    SpO2: 100%    Weight:  68 kg (150 lb)   Height:  5\' 3"  (1.6 m)            Physical Exam   Constitutional: She appears well-developed and well-nourished. No distress.    Genitourinary:   Genitourinary Comments: Papules on the labia on the right side with ulcers   Skin: She is not diaphoretic.   Nursing note and vitals reviewed.       MDM  ED Course       Procedures         DX:  Genital herpes    Dispo:  home

## 2016-05-15 LAB — HSV TYPE 2-SPECIFIC ABS, IGG W/REFL SUPPLEMENTAL TESTING: HSV 2 Ab IgG, type spec.: <0.91 index (ref 0.00–0.90)

## 2016-05-15 LAB — HSV-1 AB, IGG GLYCOPROTEIN, G-SPECIFIC: HSV 1 Ab, IgG, type spec.: 33.2 index — ABNORMAL HIGH (ref 0.00–0.90)

## 2016-05-16 LAB — HSV CULTURE WITHOUT TYPING: HSV culture w/o typing: POSITIVE — AB

## 2016-09-15 ENCOUNTER — Inpatient Hospital Stay
Admit: 2016-09-15 | Discharge: 2016-09-16 | Discharge status: Against Medical Advice | Payer: MEDICARE | Attending: Emergency Medical Services

## 2016-09-15 DIAGNOSIS — Z760 Encounter for issue of repeat prescription: Secondary | ICD-10-CM

## 2016-09-15 NOTE — ED Notes (Signed)
After speaking with Dr Lorelle GibbsHaralsen, pt left the Er without any discharge paperwork

## 2016-09-15 NOTE — ED Triage Notes (Signed)
PT presents to the ER for refill in medication.  Pt states that her doctor is on vacation and does not have anyone covering for him.  Pt states that she is our of Adderall and provigil.

## 2016-09-15 NOTE — ED Provider Notes (Signed)
Patient is a 35 y.o. female presenting with medication refill. The history is provided by the patient.   Medication Refill   Chronicity: chronic prescription for adderall and provigil. Associated symptoms comments: none. Nothing aggravates the symptoms. Nothing relieves the symptoms. She has tried nothing for the symptoms.        Past Medical History:   Diagnosis Date   ??? ADD (attention deficit disorder)    ??? Anemia    ??? Fibromyalgia    ??? Idiopathic hypersomnia    ??? Lupus    ??? Psychiatric disorder     anxiety, depression       Past Surgical History:   Procedure Laterality Date   ??? ABDOMEN SURGERY PROC UNLISTED  2010    gastric bypass    ??? HX GYN      C-section 2006         History reviewed. No pertinent family history.    Social History     Social History   ??? Marital status: DIVORCED     Spouse name: N/A   ??? Number of children: N/A   ??? Years of education: N/A     Occupational History   ??? Not on file.     Social History Main Topics   ??? Smoking status: Former Smoker     Packs/day: 1.50   ??? Smokeless tobacco: Current User   ??? Alcohol use No   ??? Drug use: No   ??? Sexual activity: Not on file     Other Topics Concern   ??? Not on file     Social History Narrative         ALLERGIES: Review of patient's allergies indicates no known allergies.    Review of Systems   All other systems reviewed and are negative.      Vitals:    09/15/16 1905   BP: 133/87   Pulse: (!) 107   Resp: 18   Temp: 98.1 ??F (36.7 ??C)   SpO2: 100%   Weight: 68 kg (150 lb)   Height: 5\' 4"  (1.626 m)            Physical Exam   Constitutional: She is oriented to person, place, and time. She appears well-developed and well-nourished. No distress.   Pulmonary/Chest: Effort normal.   Musculoskeletal: Normal range of motion.   Neurological: She is alert and oriented to person, place, and time. No cranial nerve deficit. She exhibits normal muscle tone. Coordination normal.   Skin: Skin is warm and dry. She is not diaphoretic.    Psychiatric: Her mood appears not anxious. Her affect is blunt. Her affect is not angry, not labile and not inappropriate. Her speech is rapid and/or pressured. Her speech is not delayed, not tangential and not slurred. She is agitated and aggressive. She is not hyperactive, not slowed, not withdrawn, not actively hallucinating and not combative. Cognition and memory are not impaired. She expresses impulsivity and inappropriate judgment. She does not exhibit a depressed mood. She is communicative. She exhibits normal recent memory and normal remote memory. She is attentive.   Nursing note and vitals reviewed.       MDM  Number of Diagnoses or Management Options  Prescription refill:   Diagnosis management comments: Wants refill on adderall and provigil as she missed her last appointment.  I have informed her I am unable to refill this type of medication which is not considered emergent and she must contact her prescribing provider for refills.   She  continued to insist that I write this prescription for her as she would not be able to get it from her procider until after new years as her provider is on vacation.  I again informed her these are not emergent medications but are controlled medications which should only be prescribed by her provider.  No prescription refill was given.            ED Course       Procedures

## 2016-09-24 DIAGNOSIS — J069 Acute upper respiratory infection, unspecified: Secondary | ICD-10-CM

## 2016-09-24 NOTE — ED Provider Notes (Signed)
Patient is a 36 y.o. female presenting with nasal congestion. The history is provided by the patient.   Nasal Congestion    This is a recurrent problem. The current episode started more than 1 week ago. The problem has not changed since onset.There has been no fever. The patient is experiencing no pain. The pain has been fluctuating since onset. Associated symptoms include congestion and cough. Pertinent negatives include no chills, no sweats, no ear pain, no hoarse voice, no sinus pressure, no sore throat, no swollen glands, no shortness of breath, no neck pain, no neck pain, no headaches and no chest pain. She has tried decongestants for the symptoms. The treatment provided no relief.        Past Medical History:   Diagnosis Date   ??? ADD (attention deficit disorder)    ??? Anemia    ??? Fibromyalgia    ??? Idiopathic hypersomnia    ??? Lupus    ??? Psychiatric disorder     anxiety, depression       Past Surgical History:   Procedure Laterality Date   ??? ABDOMEN SURGERY PROC UNLISTED  2010    gastric bypass    ??? HX GYN      C-section 2006         No family history on file.    Social History     Social History   ??? Marital status: DIVORCED     Spouse name: N/A   ??? Number of children: N/A   ??? Years of education: N/A     Occupational History   ??? Not on file.     Social History Main Topics   ??? Smoking status: Former Smoker     Packs/day: 1.50     Quit date: 09/03/2016   ??? Smokeless tobacco: Current User   ??? Alcohol use No   ??? Drug use: No   ??? Sexual activity: Not on file     Other Topics Concern   ??? Not on file     Social History Narrative         ALLERGIES: Review of patient's allergies indicates no known allergies.    Review of Systems   Constitutional: Negative for chills and fever.   HENT: Positive for congestion. Negative for ear discharge, ear pain, facial swelling, hoarse voice, sinus pain, sinus pressure, sore throat, trouble swallowing and voice change.    Eyes: Negative.     Respiratory: Positive for cough. Negative for chest tightness, shortness of breath and wheezing.    Cardiovascular: Negative for chest pain and leg swelling.   Gastrointestinal: Negative for nausea and vomiting.   Musculoskeletal: Negative.  Negative for myalgias, neck pain and neck stiffness.   Skin: Negative.    Allergic/Immunologic: Negative for immunocompromised state.   Neurological: Negative.  Negative for dizziness and headaches.   Hematological: Negative.    All other systems reviewed and are negative.      There were no vitals filed for this visit.         Physical Exam   Constitutional: She is oriented to person, place, and time. She appears well-developed and well-nourished. No distress.   HENT:   Head: Normocephalic and atraumatic.   Right Ear: Hearing, tympanic membrane, external ear and ear canal normal.   Left Ear: Hearing, tympanic membrane, external ear and ear canal normal.   Nose: Mucosal edema present. No rhinorrhea or sinus tenderness. No epistaxis. Right sinus exhibits no maxillary sinus tenderness and no frontal sinus tenderness.  Left sinus exhibits no maxillary sinus tenderness and no frontal sinus tenderness.   Mouth/Throat: Oropharynx is clear and moist and mucous membranes are normal. Mucous membranes are not pale. No oral lesions. No oropharyngeal exudate, posterior oropharyngeal edema, posterior oropharyngeal erythema or tonsillar abscesses.   Eyes: Conjunctivae and EOM are normal. Pupils are equal, round, and reactive to light. Right eye exhibits no discharge. Left eye exhibits no discharge.   Neck: Normal range of motion, full passive range of motion without pain and phonation normal. Neck supple. Normal carotid pulses, no hepatojugular reflux and no JVD present. No muscular tenderness present. Carotid bruit is not present. No rigidity. No tracheal deviation, no erythema and normal range of motion present. No thyroid mass and no thyromegaly present.    Cardiovascular: Normal rate, regular rhythm, S1 normal, S2 normal, normal heart sounds, intact distal pulses and normal pulses.  Exam reveals no gallop and no friction rub.    No murmur heard.  Pulmonary/Chest: Effort normal and breath sounds normal. No accessory muscle usage or stridor. No tachypnea. No respiratory distress. She has no decreased breath sounds. She has no wheezes. She has no rhonchi. She has no rales.   Abdominal: Soft. There is no tenderness.   Musculoskeletal: Normal range of motion. She exhibits no edema or tenderness.   Lymphadenopathy:     She has no cervical adenopathy.   Neurological: She is alert and oriented to person, place, and time. No cranial nerve deficit. She exhibits normal muscle tone. Coordination normal.   Skin: Skin is warm and dry. No rash noted. She is not diaphoretic. No erythema. No pallor.   Psychiatric: She has a normal mood and affect. Her behavior is normal.   Nursing note and vitals reviewed.       MDM  Number of Diagnoses or Management Options  Acute upper respiratory infection:   Diagnosis management comments: 36 yo female to ED with complaints of 2 weeks of sinus congestion and mild np cough which persists in spite of use of OTC cough/cold remedies.  She has had no fever/chills, no sputum, no chest pain, no leg pain/swelling/edema.  She has not previously seen PCP for these symptoms.  She is a smoker.      Exam with well appearance, no tachypnea, well oxygenated, no WOB, lungs clear/equal bilaterally, CV RRR no mgr.  No edema/swelling.  Normotensive and afebrile.      Impression:    Viral URI    Disposition:    Home, instructions, symptomatic care, PCP f/u        Patient Progress  Patient progress: stable    ED Course       Procedures

## 2016-09-24 NOTE — ED Triage Notes (Signed)
Pt c/o head and chest congestion "for weeks". States she felt better for a few days and now bad again. Pt not sure if she has/had fever.

## 2016-09-24 NOTE — ED Notes (Signed)
Pt examined by Dr. Susy ManorHaralson and cleared for DC.

## 2016-09-25 ENCOUNTER — Inpatient Hospital Stay
Admit: 2016-09-25 | Discharge: 2016-09-25 | Discharge status: Against Medical Advice | Payer: MEDICARE | Attending: Emergency Medical Services

## 2016-09-25 ENCOUNTER — Inpatient Hospital Stay
Admit: 2016-09-25 | Discharge: 2016-09-25 | Disposition: A | Discharge status: Home / Self Care | Payer: MEDICARE | Attending: Emergency Medical Services

## 2016-09-25 NOTE — ED Notes (Signed)
Patient is awake, alert, and oriented, speech is clear and patient is able to ambulate. Pt is ready for discharge. Verbal and written discharge instructions provided and pt has cognitive understanding of discharge instructions. Discharged home with family. All questions answered.

## 2017-07-19 ENCOUNTER — Emergency Department: Admit: 2017-07-20 | Payer: MEDICAID

## 2017-07-19 DIAGNOSIS — O200 Threatened abortion: Secondary | ICD-10-CM

## 2017-07-19 NOTE — ED Provider Notes (Signed)
36 yo f G 4 P2 AB 1 LMP over a year ago as pt is on Depo provera, seen in [facility] on 07/11/17 with Vaginal Bleeding US revealed and IUP @ [redacted]W[redacted]D, reports another episode noted a pool of blood @ 8:30 AM and hasn't bled since.  Denies abdominal pain, dysuria, fever or chills.  Pt reports RH+ Blood type.      The history is provided by the patient.   Vaginal Bleeding   This is a recurrent problem. The current episode started 12 to 24 hours ago. The problem occurs rarely. The problem has been resolved. Pertinent negatives include no chest pain, no abdominal pain, no headaches and no shortness of breath. Nothing aggravates the symptoms. Nothing relieves the symptoms. She has tried nothing for the symptoms.   Pregnancy Problem   Pertinent negatives include no chest pain, no abdominal pain, no headaches and no shortness of breath.   Urinary Frequency    Associated symptoms include frequency. Pertinent negatives include no chills, no nausea, no vomiting, no hematuria, no abdominal pain and no back pain.        Past Medical History:   Diagnosis Date   ??? ADD (attention deficit disorder)    ??? Anemia    ??? Fibromyalgia    ??? Idiopathic hypersomnia    ??? Lupus    ??? Psychiatric disorder     anxiety, depression       Past Surgical History:   Procedure Laterality Date   ??? ABDOMEN SURGERY PROC UNLISTED  2010    gastric bypass    ??? HX GYN      C-section 2006         History reviewed. No pertinent family history.    Social History     Socioeconomic History   ??? Marital status: DIVORCED     Spouse name: Not on file   ??? Number of children: Not on file   ??? Years of education: Not on file   ??? Highest education level: Not on file   Social Needs   ??? Financial resource strain: Not on file   ??? Food insecurity - worry: Not on file   ??? Food insecurity - inability: Not on file   ??? Transportation needs - medical: Not on file   ??? Transportation needs - non-medical: Not on file   Occupational History   ??? Not on file   Tobacco Use    ??? Smoking status: Former Smoker     Packs/day: 1.50     Last attempt to quit: 09/03/2016     Years since quitting: 0.8   ??? Smokeless tobacco: Current User   Substance and Sexual Activity   ??? Alcohol use: No   ??? Drug use: No   ??? Sexual activity: Not on file   Other Topics Concern   ??? Not on file   Social History Narrative   ??? Not on file         ALLERGIES: Patient has no known allergies.    Review of Systems   Constitutional: Negative for chills and fever.   HENT: Negative for rhinorrhea and sore throat.    Eyes: Negative for redness and visual disturbance.   Respiratory: Negative for cough and shortness of breath.    Cardiovascular: Negative for chest pain and palpitations.   Gastrointestinal: Negative for abdominal pain, diarrhea, nausea and vomiting.   Genitourinary: Positive for frequency and vaginal bleeding. Negative for difficulty urinating, dysuria and hematuria.   Musculoskeletal: Negative for  back pain, myalgias, neck pain and neck stiffness.   Skin: Negative for pallor and rash.   Neurological: Negative for syncope, light-headedness and headaches.       Vitals:    07/19/17 2143 07/19/17 2146   BP:  124/78   Pulse:  97   Resp:  18   Temp:  98.3 ??F (36.8 ??C)   SpO2:  100%   Weight: 77.1 kg (170 lb)    Height: 5\' 4"  (1.626 m)             Physical Exam   Constitutional: She is oriented to person, place, and time. She appears well-developed and well-nourished. No distress.   HENT:   Head: Normocephalic and atraumatic.   Mouth/Throat: Oropharynx is clear and moist.   Eyes: EOM are normal. Pupils are equal, round, and reactive to light.   Neck: Neck supple. No JVD present.   Cardiovascular: Normal rate, regular rhythm and normal heart sounds.   Pulmonary/Chest: Breath sounds normal. No respiratory distress.   Abdominal: Soft. Bowel sounds are normal. She exhibits no distension. There is no tenderness.   Musculoskeletal: Normal range of motion. She exhibits no edema.    Neurological: She is alert and oriented to person, place, and time. No cranial nerve deficit.   Skin: Skin is warm and dry. She is not diaphoretic. No pallor.   Psychiatric: Her behavior is normal.   Nursing note and vitals reviewed.       MDM       Procedures    <EMERGENCY DEPARTMENT CASE SUMMARY>  Impression/Differential Diagnosis: Pregnant, Vaginal Bleed, R/O Threatened AB    Plan: Exam, IV Fluids, labs with B-Quant, TVUS    ED Course: labs reviewed:nl WBC, no shift, hypokalemia, otherwise unremarkable chem  Urine:leu, nit, bld neg  HCG c/w 5-[redacted] week gestation    [place] Uts Transvaginal Ob    Result Date: 07/20/2017  Ultrasound evaluation of a first trimester pregnancy.   for patient     female of 36 years CLINICAL INFORMATION: Pregnancy complicated by vaginal bleeding. TECHNIQUE: Transvaginal ultrasonography was performed.  A duplex/doppler ultrasound including both color flow AND spectral analysis (waveforms) were interpreted.  FINDINGS:  No prior relevant studies were submitted for direct comparison. The uterus is anteverted.  It measures 9.9 cm in length x 6.4 cm AP x 6.9 cm transverse.  Its contours are smooth.  The myometrium demonstrates no focal lesion.  The endometrium is signficant for a single intrauterine gestation, positioned in the lower uterine segment.  A normal decidual reaction is recognized.  A yolk sac is present.  The fetal pole demonstrates a crown-rump length of 0.7 cm, corresponding to an estimated fetal age of 6 weeks 4 days.  There was no fetal cardiac activity detected on this examination.  There is a large subchorionic hemorrhage noted to measure 4.2 x 2.4 x 3.7 cm. There is a 0.7 cm nabothian cyst noted. The right ovary measures 3.3 x 2.2 x 2.8 cm, demonstrating a simple cyst measuring 1.9 x 1.5 x 1.8 cm.  The left ovary measures 4.1 x 2.0 x 2.9 cm, demonstrating a corpus with a cyst measuring 1.8 x 1.3 x 1.8 cm.  Bilateral ovarian flow is demonstrated by Doppler.  The cervix is closed, demonstrating small free fluid within the endocervical canal. No free fluid is found in the pelvis. The urinary bladder was not visualized on this examination secondary to collapsed state.     IMPRESSION:  A single intrauterine gestation positioned in the  lower uterine segment, with estimated fetal age of 6 weeks and 4 days based on crown-rump length, without evidence of fetal cardiac activity, which maybe secondary to early gestational age versus fetal demise.  Recommend followup OB/GYN consultation, correlation with beta-hCG levels and followup as clinically indicated. Large subchorionic hemorrhage as described above.  Recommend followup as clinically indicated. Bilateral ovarian flow is present. Left ovarian corpus luteal cyst as described above. Simple right ovarian cyst as described above. Recommend followup full fetal anatomy survey at 20-[redacted] weeks gestational age. THIS DOCUMENT HAS BEEN ELECTRONICALLY SIGNED BY Wynonia Lawman MD      Final Impression/Diagnosis: Threatened AB    Patient condition at time of disposition: Stable    I have reviewed the following home medications:    Prior to Admission medications    Medication Sig Start Date End Date Taking? Authorizing Provider   modafinil (PROVIGIL) 200 mg tablet Take 200 mg by mouth two (2) times a day.   Yes Other, Phys, MD   buPROPion XL (WELLBUTRIN XL) 300 mg XL tablet Take 300 mg by mouth every morning.   Yes Other, Phys, MD   sertraline (ZOLOFT) 25 mg tablet Take 25 mg by mouth daily.   Yes Other, Phys, MD   ARIPiprazole (ABILIFY) 15 mg tablet Take 15 mg by mouth daily.   Yes Other, Phys, MD   OXcarbazepine (TRILEPTAL) 150 mg tablet Take 600 mg by mouth two (2) times a day.   Yes Other, Phys, MD   dextroamphetamine-amphetamine (ADDERALL) 30 mg tablet Take 30 mg by mouth two (2) times a day.   Yes Other, Phys, MD   ALPRAZolam (XANAX) 1 mg tablet Take 1 mg by mouth nightly as needed for Anxiety.   Yes Other, Phys, MD          Mikle Bosworth, MD

## 2017-07-19 NOTE — ED Triage Notes (Signed)
Patient c/o one episode of vaginal bleeding this morning at 0830. Said it was bright red when she wiped but nothing since. Had one episode of brownish blood on Tuesday in her underwear. [facility] and was told via ultrasound she was 3149w1d pregnant. No c/o of cramping at this time. Also c/o urinary frequency

## 2017-07-20 ENCOUNTER — Inpatient Hospital Stay
Admit: 2017-07-20 | Discharge: 2017-07-20 | Disposition: A | Discharge status: Home / Self Care | Payer: MEDICAID | Attending: Emergency Medicine

## 2017-07-20 LAB — METABOLIC PANEL, COMPREHENSIVE
A-G Ratio: 1 (ref 1.0–1.5)
ALT (SGPT): 26 U/L (ref 12–78)
AST (SGOT): 17 U/L (ref 15–37)
Albumin: 3.8 g/dL (ref 3.4–5.0)
Alk. phosphatase: 80 U/L (ref 46–116)
Anion gap: 12 mmol/L (ref 8–20)
BUN: 7 mg/dL (ref 7–18)
Bilirubin, total: 0.3 mg/dL (ref 0.2–1.0)
CO2: 24 mmol/L (ref 21–32)
Calcium: 8.8 mg/dL (ref 8.5–10.1)
Chloride: 102 mmol/L (ref 98–107)
Creatinine: 0.85 mg/dL (ref 0.55–1.02)
GFR est AA: >60 mL/min/{1.73_m2} (ref >60)
GFR est non-AA: >60 mL/min/{1.73_m2} (ref >60)
Globulin: 3.8 g/dL (ref 2.5–5.0)
Glucose: 122 mg/dL — ABNORMAL HIGH (ref 74–106)
Potassium: 3.1 mmol/L — ABNORMAL LOW (ref 3.5–5.1)
Protein, total: 7.6 g/dL (ref 6.4–8.2)
Sodium: 138 mmol/L (ref 136–145)

## 2017-07-20 LAB — CBC WITH AUTOMATED DIFF
ABS. BASOPHILS: 0 10*3/uL (ref 0.0–0.1)
ABS. EOSINOPHILS: 0.2 10*3/uL (ref 0.0–0.2)
ABS. LYMPHOCYTES: 2.3 10*3/uL (ref 1.0–5.5)
ABS. MONOCYTES: 0.6 10*3/uL (ref 0.1–1.0)
ABS. NEUTROPHILS: 4.8 10*3/uL (ref 2.0–8.1)
BASOPHILS: 1 % (ref 0.0–1.0)
EOSINOPHILS: 2 % (ref 0.0–2.0)
HCT: 33.2 % — ABNORMAL LOW (ref 37.0–47.0)
HGB: 11.5 g/dL — ABNORMAL LOW (ref 12.0–16.0)
LYMPHOCYTES: 29 % (ref 20.5–51.1)
MCH: 30.7 PG (ref 27.0–31.0)
MCHC: 34.6 g/dL (ref 30.5–36.0)
MCV: 88.8 FL (ref 81.0–100.0)
MONOCYTES: 8 % (ref 1.7–10.0)
MPV: 8.7 FL — ABNORMAL LOW (ref 10.2–12.7)
NEUTROPHILS: 60 % (ref 42.2–75.2)
PLATELET: 327 10*3/uL (ref 122–400)
RBC: 3.74 M/uL — ABNORMAL LOW (ref 4.20–5.40)
RDW: 13.2 % (ref 11.4–14.6)
WBC: 7.9 10*3/uL (ref 4.8–10.8)

## 2017-07-20 LAB — URINALYSIS W/ RFLX MICROSCOPIC
Bilirubin: NEGATIVE
Blood: NEGATIVE
Glucose: NEGATIVE mg/dL
Ketone: NEGATIVE mg/dL
Leukocyte Esterase: NEGATIVE
Nitrites: NEGATIVE
Protein: NEGATIVE mg/dL
Specific gravity: >1.03 — ABNORMAL HIGH (ref 1.005–1.030)
Urobilinogen: 0.2 EU/dL (ref 0.1–1.0)
pH (UA): 6 (ref 4.5–8.0)

## 2017-07-20 LAB — TYPE & SCREEN
ABO/Rh(D): A POS
Antibody screen: NEGATIVE

## 2017-07-20 LAB — BETA HCG, QT
HCG,INTACT, THCGA1: 64723 m[IU]/mL
HCG,INTACT: 64723 m[IU]/mL

## 2017-07-20 LAB — PTT: aPTT: 27.2 s (ref 24.3–32.2)

## 2017-07-20 LAB — PROTHROMBIN TIME + INR
INR: 1.1 (ref 0.8–1.2)
Prothrombin time: 11 s (ref 9.6–11.0)

## 2017-07-20 LAB — TYPE AND SCREEN
ABO/Rh: A POS
Antibody Screen: NEGATIVE

## 2017-07-20 MED ORDER — POTASSIUM CHLORIDE SR 20 MEQ TAB, PARTICLES/CRYSTALS
20 mEq | ORAL_TABLET | Freq: Two times a day (BID) | ORAL | 0 refills | Status: AC
Start: 2017-07-20 — End: 2017-07-25

## 2017-07-20 MED ORDER — SODIUM CHLORIDE 0.9 % IV
INTRAVENOUS | Status: DC
Start: 2017-07-20 — End: 2017-07-20
  Administered 2017-07-20: 02:00:00 via INTRAVENOUS

## 2017-07-20 MED ORDER — PANTOPRAZOLE 40 MG TAB, DELAYED RELEASE
40 mg | ORAL | Status: AC
Start: 2017-07-20 — End: 2017-07-20
  Administered 2017-07-20: 05:00:00 via ORAL

## 2017-07-20 MED ORDER — POTASSIUM CHLORIDE SR 10 MEQ TAB
10 mEq | ORAL | Status: AC
Start: 2017-07-20 — End: 2017-07-19
  Administered 2017-07-20: 04:00:00 via ORAL

## 2017-07-20 MED FILL — PANTOPRAZOLE 40 MG TAB, DELAYED RELEASE: 40 mg | ORAL | Qty: 1

## 2017-07-20 MED FILL — SODIUM CHLORIDE 0.9 % IV: INTRAVENOUS | Qty: 1000

## 2017-07-20 MED FILL — KLOR-CON 10 MEQ TABLET,EXTENDED RELEASE: 10 mEq | ORAL | Qty: 4

## 2017-07-20 NOTE — ED Notes (Signed)
Patient is awake, alert, and oriented, speech is clear and patient is able to ambulate  and ready for discharge. Verbal and written discharge instructions provided and has the cognitive understanding of discharge instructions. Discharged home with friend. All questions answered.

## 2017-07-27 ENCOUNTER — Ambulatory Visit

## 2017-07-27 DIAGNOSIS — O021 Missed abortion: Secondary | ICD-10-CM

## 2017-07-27 NOTE — ED Triage Notes (Signed)
Patient presents to ED with SO for follow up from previous ER visit. Patient is pregnant states vaginal bleeding/spotting has subsided. States unable to get to OBGYN due to work schedule.

## 2017-07-27 NOTE — ED Provider Notes (Signed)
36 yo female here for "followup" of threatened 1st trimester AB seen here a week ago by Dr. Elsie Stainoshe with vaginal bleeding, had TVUS with 6 week fetus intrauterine with no fetal cardiac activity and large subchorionic hemorrhage.  Patient was stable and was to follow up with Dr. Marya AmslerBruckineir this week however she now reports bleeding had stopped and she had to work so did not followup.  Now bleeding has recurred although light.  She has no pain, no cramping and has not passed any fetal tissue.  She is afebrile.  Mildly tachycardic.            Vaginal Bleeding   This is a recurrent problem. The current episode started more than 1 week ago. The problem occurs daily. The problem has not changed since onset.Pertinent negatives include no chest pain, no abdominal pain, no headaches and no shortness of breath. Nothing aggravates the symptoms. Nothing relieves the symptoms. She has tried nothing for the symptoms.        Past Medical History:   Diagnosis Date   ??? ADD (attention deficit disorder)    ??? Anemia    ??? Fibromyalgia    ??? Idiopathic hypersomnia    ??? Lupus    ??? Psychiatric disorder     anxiety, depression       Past Surgical History:   Procedure Laterality Date   ??? ABDOMEN SURGERY PROC UNLISTED  2010    gastric bypass    ??? HX GYN      C-section 2006         History reviewed. No pertinent family history.    Social History     Socioeconomic History   ??? Marital status: DIVORCED     Spouse name: Not on file   ??? Number of children: Not on file   ??? Years of education: Not on file   ??? Highest education level: Not on file   Social Needs   ??? Financial resource strain: Not on file   ??? Food insecurity - worry: Not on file   ??? Food insecurity - inability: Not on file   ??? Transportation needs - medical: Not on file   ??? Transportation needs - non-medical: Not on file   Occupational History   ??? Not on file   Tobacco Use   ??? Smoking status: Former Smoker     Packs/day: 1.50     Last attempt to quit: 09/03/2016      Years since quitting: 0.8   ??? Smokeless tobacco: Current User   Substance and Sexual Activity   ??? Alcohol use: No   ??? Drug use: No   ??? Sexual activity: Not on file   Other Topics Concern   ??? Not on file   Social History Narrative   ??? Not on file         ALLERGIES: Patient has no known allergies.    Review of Systems   Constitutional: Negative for chills and fever.   Respiratory: Negative for shortness of breath.    Cardiovascular: Negative for chest pain.   Gastrointestinal: Negative for abdominal pain, nausea and vomiting.   Genitourinary: Positive for vaginal bleeding. Negative for pelvic pain.   Skin: Negative for color change and pallor.   Neurological: Negative for dizziness, syncope and headaches.   All other systems reviewed and are negative.      Vitals:    07/27/17 2301   BP: 133/88   Pulse: (!) 101   Resp: 16   Temp:  97.7 ??F (36.5 ??C)   SpO2: 100%   Weight: 79.4 kg (175 lb)   Height: 5\' 7"  (1.702 m)            Physical Exam   Constitutional: She is oriented to person, place, and time. Vital signs are normal. She appears well-developed and well-nourished. She is cooperative.  Non-toxic appearance. She does not have a sickly appearance. She does not appear ill. No distress.   HENT:   Head: Normocephalic and atraumatic.   Right Ear: Hearing normal.   Left Ear: Hearing normal.   Mouth/Throat: Oropharynx is clear and moist and mucous membranes are normal. No oral lesions.   Eyes: Conjunctivae, EOM and lids are normal. Pupils are equal, round, and reactive to light. Right conjunctiva is not injected. Right conjunctiva has no hemorrhage. Left conjunctiva is not injected. Left conjunctiva has no hemorrhage. Right eye exhibits normal extraocular motion and no nystagmus. Left eye exhibits normal extraocular motion and no nystagmus. Right pupil is round and reactive. Left pupil is round and reactive. Pupils are equal.   Neck: Trachea normal, normal range of motion, full passive range of motion  without pain and phonation normal. Neck supple. No tracheal tenderness present. No neck rigidity. No thyroid mass present.   Cardiovascular: Normal rate, regular rhythm, normal heart sounds and intact distal pulses.  No extrasystoles are present. Exam reveals no gallop and no friction rub.   No murmur heard.  Pulses:       Carotid pulses are 2+ on the right side, and 2+ on the left side.       Radial pulses are 2+ on the right side, and 2+ on the left side.   Pulmonary/Chest: Effort normal and breath sounds normal. No accessory muscle usage. No tachypnea. She has no decreased breath sounds. She has no rhonchi.   Abdominal: Soft. Normal appearance and bowel sounds are normal. She exhibits no distension, no ascites and no mass. There is no hepatosplenomegaly. There is no tenderness. There is no rigidity, no rebound, no guarding, no CVA tenderness and negative Murphy's sign.   Musculoskeletal: Normal range of motion.   Neurological: She is alert and oriented to person, place, and time. She displays no tremor. She exhibits normal muscle tone. Gait normal.   Skin: Skin is warm, dry and intact. Capillary refill takes less than 2 seconds. No lesion noted. She is not diaphoretic. No cyanosis. No pallor.   Psychiatric: She has a normal mood and affect. Her speech is normal and behavior is normal. Cognition and memory are normal.   Nursing note and vitals reviewed.       MDM  Number of Diagnoses or Management Options  Retained products of conception following abortion:   Diagnosis management comments: 36 yo female here for "followup" of threatened 1st trimester AB seen here a week ago by Dr. Elsie Stain with vaginal bleeding, had TVUS with 6 week fetus intrauterine with no fetal cardiac activity and large subchorionic hemorrhage.  Patient was stable and was to follow up with Dr. Marya Amsler this week however she now reports bleeding had stopped and she had to work so did not followup.  Now bleeding has  recurred although light.  She has no pain, no cramping and has not passed any fetal tissue.  She is afebrile.  Mildly tachycardic.      Will repeat beta hcg (quant) and repeat TVUS to eval for any changes from previous.  Discussion with patient regarding importance of followup with OBGyn.  Results:    TVUS with POC in uterus with subchorionic hemorrhage.  Patient with minimal VB and no pelvic tenderness, WBC 10, stable H&H.  Patient is RH positive.  Discussed with Dr. Dion SaucierLandau. Will admit for D&C in AM.  Will discuss with Dr. Marcelino ScotBruckinier in AM.        Impression:    Retained POC after spontaneous 1st trimester AB with vaginal bleeding    Disposition:    Admit             Amount and/or Complexity of Data Reviewed  Clinical lab tests: reviewed and ordered  Tests in the radiology section of CPT??: reviewed and ordered  Review and summarize past medical records: yes  Discuss the patient with other providers: yes  Independent visualization of images, tracings, or specimens: yes    Risk of Complications, Morbidity, and/or Mortality  Presenting problems: high  Diagnostic procedures: high  Management options: high    Patient Progress  Patient progress: stable         Procedures

## 2017-07-28 ENCOUNTER — Inpatient Hospital Stay
Admit: 2017-07-28 | Discharge: 2017-07-28 | Disposition: A | Discharge status: Home / Self Care | Payer: MEDICAID | Attending: Emergency Medical Services

## 2017-07-28 ENCOUNTER — Emergency Department: Admit: 2017-07-28 | Payer: MEDICAID

## 2017-07-28 LAB — CBC WITH AUTOMATED DIFF
ABS. BASOPHILS: 0 10*3/uL (ref 0.0–0.1)
ABS. EOSINOPHILS: 0.2 10*3/uL (ref 0.0–0.2)
ABS. LYMPHOCYTES: 2.4 10*3/uL (ref 1.0–5.5)
ABS. MONOCYTES: 0.8 10*3/uL (ref 0.1–1.0)
ABS. NEUTROPHILS: 7 10*3/uL (ref 2.0–8.1)
BASOPHILS: 0 % (ref 0.0–1.0)
EOSINOPHILS: 2 % (ref 0.0–2.0)
HCT: 36 % — ABNORMAL LOW (ref 37.0–47.0)
HGB: 12.3 g/dL (ref 12.0–16.0)
LYMPHOCYTES: 23 % (ref 20.5–51.1)
MCH: 30.4 PG (ref 27.0–31.0)
MCHC: 34.2 g/dL (ref 30.5–36.0)
MCV: 88.9 FL (ref 81.0–100.0)
MONOCYTES: 8 % (ref 1.7–10.0)
MPV: 8.6 FL — ABNORMAL LOW (ref 10.2–12.7)
NEUTROPHILS: 67 % (ref 42.2–75.2)
PLATELET: 372 10*3/uL (ref 122–400)
RBC: 4.05 M/uL — ABNORMAL LOW (ref 4.20–5.40)
RDW: 12.4 % (ref 11.4–14.6)
WBC: 10.4 10*3/uL (ref 4.8–10.8)

## 2017-07-28 LAB — TYPE & SCREEN
ABO/Rh(D): A POS
Antibody screen: NEGATIVE

## 2017-07-28 LAB — BETA HCG, QT
HCG,INTACT, THCGA1: 58731 m[IU]/mL
HCG,INTACT: 58731 m[IU]/mL

## 2017-07-28 LAB — TYPE AND SCREEN
ABO/Rh: A POS
Antibody Screen: NEGATIVE

## 2017-07-28 MED ORDER — PROPOFOL 10 MG/ML IV EMUL
10 mg/mL | INTRAVENOUS | Status: DC | PRN
Start: 2017-07-28 — End: 2017-07-28
  Administered 2017-07-28 (×4): via INTRAVENOUS

## 2017-07-28 MED ORDER — PROPOFOL 10 MG/ML IV EMUL
10 mg/mL | INTRAVENOUS | Status: AC
Start: 2017-07-28 — End: ?

## 2017-07-28 MED ORDER — FENTANYL CITRATE (PF) 50 MCG/ML IJ SOLN
50 mcg/mL | INTRAMUSCULAR | Status: AC
Start: 2017-07-28 — End: ?

## 2017-07-28 MED ORDER — KETOROLAC TROMETHAMINE 30 MG/ML INJECTION
30 mg/mL (1 mL) | INTRAMUSCULAR | Status: DC | PRN
Start: 2017-07-28 — End: 2017-07-28
  Administered 2017-07-28: 14:00:00 via INTRAVENOUS

## 2017-07-28 MED ORDER — MIDAZOLAM 1 MG/ML IJ SOLN
1 mg/mL | INTRAMUSCULAR | Status: DC | PRN
Start: 2017-07-28 — End: 2017-07-28
  Administered 2017-07-28: 13:00:00 via INTRAVENOUS

## 2017-07-28 MED ORDER — LACTATED RINGERS IV
INTRAVENOUS | Status: DC
Start: 2017-07-28 — End: 2017-07-28
  Administered 2017-07-28: 09:00:00 via INTRAVENOUS

## 2017-07-28 MED ORDER — METHYLERGONOVINE MALEATE 0.2 MG TAB
0.2 mg | ORAL_TABLET | Freq: Four times a day (QID) | ORAL | 0 refills | Status: DC
Start: 2017-07-28 — End: 2018-02-19

## 2017-07-28 MED ORDER — FENTANYL CITRATE (PF) 50 MCG/ML IJ SOLN
50 mcg/mL | INTRAMUSCULAR | Status: DC | PRN
Start: 2017-07-28 — End: 2017-07-28
  Administered 2017-07-28 (×2): via INTRAVENOUS

## 2017-07-28 MED ORDER — SODIUM CHLORIDE 0.9 % IJ SYRG
INTRAMUSCULAR | Status: DC | PRN
Start: 2017-07-28 — End: 2017-07-28

## 2017-07-28 MED ORDER — OXYTOCIN 20 UNITS/1000 ML IN LACTATED RINGERS IV
20 unit/1,000 mL | INTRAVENOUS | Status: DC | PRN
Start: 2017-07-28 — End: 2017-07-28
  Administered 2017-07-28: 14:00:00 via INTRAVENOUS

## 2017-07-28 MED ORDER — METHYLERGONOVINE MALEATE 0.2 MG TAB
0.2 mg | Freq: Four times a day (QID) | ORAL | Status: DC
Start: 2017-07-28 — End: 2017-07-28

## 2017-07-28 MED ORDER — IBUPROFEN 600 MG TAB
600 mg | Freq: Four times a day (QID) | ORAL | Status: DC | PRN
Start: 2017-07-28 — End: 2017-07-28

## 2017-07-28 MED ORDER — MIDAZOLAM 1 MG/ML IJ SOLN
1 mg/mL | INTRAMUSCULAR | Status: AC
Start: 2017-07-28 — End: ?

## 2017-07-28 MED ORDER — METHYLERGONOVINE MALEATE 0.2 MG TAB
0.2 mg | Freq: Four times a day (QID) | ORAL | Status: DC
Start: 2017-07-28 — End: 2017-07-28
  Administered 2017-07-28: 15:00:00 via ORAL

## 2017-07-28 MED ORDER — HYDROCODONE-ACETAMINOPHEN 5 MG-325 MG TAB
5-325 mg | ORAL | Status: DC | PRN
Start: 2017-07-28 — End: 2017-07-28

## 2017-07-28 MED ORDER — ACETAMINOPHEN 325 MG TABLET
325 mg | ORAL | Status: DC | PRN
Start: 2017-07-28 — End: 2017-07-28

## 2017-07-28 MED ORDER — LACTATED RINGERS IV
INTRAVENOUS | Status: AC
Start: 2017-07-28 — End: 2017-07-28

## 2017-07-28 MED ORDER — METHYLERGONOVINE MALEATE 0.2 MG/ML IJ SOLN
0.2 mg/mL (1 mL) | Freq: Four times a day (QID) | INTRAMUSCULAR | Status: DC
Start: 2017-07-28 — End: 2017-07-28

## 2017-07-28 MED ORDER — DIPHENHYDRAMINE 25 MG CAP
25 mg | ORAL | Status: AC
Start: 2017-07-28 — End: 2017-07-28
  Administered 2017-07-28: 08:00:00 via ORAL

## 2017-07-28 MED ORDER — SODIUM CHLORIDE 0.9 % IV
Freq: Once | INTRAVENOUS | Status: AC
Start: 2017-07-28 — End: 2017-07-28
  Administered 2017-07-28: 08:00:00 via INTRAVENOUS

## 2017-07-28 MED ORDER — SODIUM CHLORIDE 0.9 % IJ SYRG
Freq: Three times a day (TID) | INTRAMUSCULAR | Status: DC
Start: 2017-07-28 — End: 2017-07-28

## 2017-07-28 MED ORDER — OXYCODONE-ACETAMINOPHEN 5 MG-325 MG TAB
5-325 mg | ORAL | Status: DC | PRN
Start: 2017-07-28 — End: 2017-07-28

## 2017-07-28 MED FILL — DIPRIVAN 10 MG/ML INTRAVENOUS EMULSION: 10 mg/mL | INTRAVENOUS | Qty: 20

## 2017-07-28 MED FILL — FENTANYL CITRATE (PF) 50 MCG/ML IJ SOLN: 50 mcg/mL | INTRAMUSCULAR | Qty: 2

## 2017-07-28 MED FILL — MIDAZOLAM 1 MG/ML IJ SOLN: 1 mg/mL | INTRAMUSCULAR | Qty: 2

## 2017-07-28 MED FILL — SODIUM CHLORIDE 0.9 % IV: INTRAVENOUS | Qty: 1000

## 2017-07-28 MED FILL — LACTATED RINGERS IV: INTRAVENOUS | Qty: 1000

## 2017-07-28 MED FILL — DIPHENHYDRAMINE 25 MG CAP: 25 mg | ORAL | Qty: 1

## 2017-07-28 MED FILL — DIPRIVAN 10 MG/ML INTRAVENOUS EMULSION: 10 mg/mL | INTRAVENOUS | Qty: 350

## 2017-07-28 MED FILL — NORMAL SALINE FLUSH 0.9 % INJECTION SYRINGE: INTRAMUSCULAR | Qty: 10

## 2017-07-28 MED FILL — METHERGINE 0.2 MG TABLET: 0.2 mg | ORAL | Qty: 1

## 2017-07-28 MED FILL — KETOROLAC TROMETHAMINE 30 MG/ML INJECTION: 30 mg/mL (1 mL) | INTRAMUSCULAR | Qty: 30

## 2017-07-28 NOTE — Op Note (Signed)
Dictated # JB

## 2017-07-28 NOTE — Op Note (Signed)
[facility] REPORT    Erin Robinson, Erin Robinson  MR#: [id number]  DOB: 11-Jan-1981  ACCOUNT #: [id number]   DATE OF SERVICE: 07/28/2017    PREOPERATIVE DIAGNOSIS:  Missed abortion.    POSTOPERATIVE DIAGNOSIS:  Missed abortion.    PROCEDURE PERFORMED:  Suction, dilatation and curettage.    SURGEON:  Kearney Hard, MD    ASSISTANT:  none    ANESTHESIA:  MAC with Dr. Albertina Senegal.    ESTIMATED BLOOD LOSS:  20 mL.    SPECIMENS REMOVED:  POC    COMPLICATIONS:  none    IMPLANTS:  none    DRAINS:  None.    CONDITION:  The patient leaving the operating room was stable.    FINDINGS:  External genitalia within normal limits.  Cervix closed, clean.  Uterus was approximately 8-week size at the beginning of operative procedure and 6-week size at end of operative procedure.  There was a moderate amount of endometrial curettings.  The adnexa palpable within normal limits.    DESCRIPTION OF PROCEDURE:  After administration of anesthesia, the patient was placed into a dorsal lithotomy position and prepped and draped in the usual sterile manner, following which the patient was catheterized and examination under anesthesia was then performed.  Next, a weighted speculum was placed in the posterior vaginal vault and the anterior wall of the vagina was retracted with Sims retractor.  The anterior lip of the cervix was grasped with tenaculum and brought anteriorly.  Endocervical canal was then progressively dilated, following which a #8 curved suction cannula was placed into the uterine cavity, in a systematic manner, all quadrants of the uterine cavity curetted and retrieved mild amount of endometrial tissue.  Following this, suction curette was removed.  Medium size sharp curette was introduced into the uterine cavity and in a systematic manner again all quadrants of the uterine cavity were curetted.  This retrieved a scant to moderate amount of endometrial tissue.  A second pass with the suction  curette also retrieved scant amount endometrial tissue.    This concluded the operative procedures.  All instruments were removed from the vagina.  All instrument and sponge counts were reported as correct and the patient left the operating room to the recovery room in satisfactory condition.      Kearney Hard, MD       Hiram Comber / [place]  D: 07/28/2017 09:00     T: 07/28/2017 15:35  JOB #: [id number]

## 2017-07-28 NOTE — Discharge Summary (Signed)
Discharge Summary     Name: Erin Robinson MRN: [id number]     Date of Birth: [birth year]  Age: 36 y.o.  Sex: female      Admit Date: 07/27/2017    Discharge Date: 07/28/2017      Admitting Physician: Fletcher AnonStanislaw T. Dion SaucierLandau, MD     Admission Diagnoses: Missed abortion    Discharge Diagnoses:   Problem List as of 07/28/2017 Never Reviewed          Codes Class Noted - Resolved    Missed ab ICD-10-CM: O02.1  ICD-9-CM: 632  07/28/2017 - Present        Cellulitis of right hand ICD-10-CM: L03.113  ICD-9-CM: 682.4  11/20/2015 - Present               Procedures: suction D&C    Discharge Condition: Good    Hospital Course: Normal hospital course for this procedure.    Significant Diagnostic Studies:   Recent Results (from the past 24 hour(s))   BETA HCG, QT    Collection Time: 07/27/17 11:35 PM   Result Value Ref Range    HCG,INTACT 58,731 MIU/mL   CBC WITH AUTOMATED DIFF    Collection Time: 07/27/17 11:35 PM   Result Value Ref Range    WBC 10.4 4.8 - 10.8 K/uL    RBC 4.05 (L) 4.20 - 5.40 M/uL    HGB 12.3 12.0 - 16.0 g/dL    HCT 52.836.0 (L) 41.337.0 - 47.0 %    MCV 88.9 81.0 - 100.0 FL    MCH 30.4 27.0 - 31.0 PG    MCHC 34.2 30.5 - 36.0 g/dL    RDW 24.412.4 01.011.4 - 27.214.6 %    PLATELET 372 122 - 400 K/uL    MPV 8.6 (L) 10.2 - 12.7 FL    NEUTROPHILS 67 42.2 - 75.2 %    LYMPHOCYTES 23 20.5 - 51.1 %    MONOCYTES 8 1.7 - 10.0 %    EOSINOPHILS 2 0.0 - 2.0 %    BASOPHILS 0 0.0 - 1.0 %    ABS. NEUTROPHILS 7.0 2.0 - 8.1 K/UL    ABS. LYMPHOCYTES 2.4 1.0 - 5.5 K/UL    ABS. MONOCYTES 0.8 0.1 - 1.0 K/UL    ABS. EOSINOPHILS 0.2 0.0 - 0.2 K/UL    ABS. BASOPHILS 0.0 0.0 - 0.1 K/UL    DF AUTOMATED     TYPE & SCREEN    Collection Time: 07/27/17 11:35 PM   Result Value Ref Range    Crossmatch Expiration 07/30/2017     ABO/Rh(D) A POSITIVE     Antibody screen NEG        Patient Instructions:   Current Discharge Medication List      START taking these medications    Details   methylergonovine (METHERGINE) 0.2 mg tablet Take 1 Tab by mouth every six (6)  hours.  Qty: 6 Tab, Refills: 0         CONTINUE these medications which have NOT CHANGED    Details   buPROPion XL (WELLBUTRIN XL) 300 mg XL tablet Take 300 mg by mouth every morning.      sertraline (ZOLOFT) 25 mg tablet Take 25 mg by mouth daily.      ARIPiprazole (ABILIFY) 15 mg tablet Take 15 mg by mouth daily.      OXcarbazepine (TRILEPTAL) 150 mg tablet Take 600 mg by mouth two (2) times a day.      dextroamphetamine-amphetamine (ADDERALL) 30 mg tablet Take  30 mg by mouth two (2) times a day.      ALPRAZolam (XANAX) 1 mg tablet Take 0.5 mg by mouth nightly as needed for Anxiety.              Activity: No sex, douching, or tampons for 6 weeks or as directed by your physician. No heavy lifting for 6 weeks. No driving while taking pain medication.  Diet: Resume pre-hospital diet  Wound Care: None needed    Follow-up Appointments   Procedures   ??? FOLLOW UP VISIT Appointment in: One Week     Standing Status:   Standing     Number of Occurrences:   1     Order Specific Question:   Appointment in     Answer:   One Week        Signed By:  Lowella PettiesJames K. Briget Shaheed, MD     July 28, 2017

## 2017-07-28 NOTE — H&P (Addendum)
See ED note Dr Susy ManorHaralson  36 yof diagnosed last week with missed abortion , failed to follow in the office.  Today sonogram confirmed missed abortion.   Pt stable .  [name] Kitchen.  Imp Missed abortion .  P: for D&C.  Zya Finkle T. Dion SaucierLandau, MD

## 2017-07-28 NOTE — Progress Notes (Signed)
Allert oriented, eating breakfast drinking OJ.  Moderate Lochia.

## 2017-07-28 NOTE — Anesthesia Post-Procedure Evaluation (Signed)
Procedure(s):  DILATATION AND CURETTAGE WITH SUCTION.    Anesthesia Post Evaluation        Patient location during evaluation: floor  Patient participation: complete - patient participated  Level of consciousness: awake and alert  Pain management: adequate  Airway patency: patent  Anesthetic complications: no  Cardiovascular status: acceptable  Respiratory status: acceptable  Hydration status: acceptable  Post anesthesia nausea and vomiting:  controlled      Visit Vitals  BP 110/80   Pulse 88   Temp 37 ??C (98.6 ??F)   Resp 18   Ht 5\' 4"  (1.626 m)   Wt 77.1 kg (170 lb)   SpO2 100%   Breastfeeding? No   BMI 29.18 kg/m??

## 2017-07-28 NOTE — Discharge Summary (Signed)
Discharge Summary     Name: Erin LoganLeah Marie Robinson MRN: [id number]     Date of Birth: September 15, 1981  Age: 36 y.o.  Sex: female      Admit Date: 07/27/2017    Discharge Date: 07/28/2017      Admitting Physician: Fletcher AnonStanislaw T. Dion SaucierLandau, MD     Admission Diagnoses: Missed abortion    Discharge Diagnoses:   Problem List as of 07/28/2017 Never Reviewed          Codes Class Noted - Resolved    Missed ab ICD-10-CM: O02.1  ICD-9-CM: 632  07/28/2017 - Present        Cellulitis of right hand ICD-10-CM: L03.113  ICD-9-CM: 682.4  11/20/2015 - Present               Procedures: suction D&C    Discharge Condition: Good    Hospital Course: Normal hospital course for this procedure.    Significant Diagnostic Studies:   Recent Results (from the past 24 hour(s))   BETA HCG, QT    Collection Time: 07/27/17 11:35 PM   Result Value Ref Range    HCG,INTACT 58,731 MIU/mL   CBC WITH AUTOMATED DIFF    Collection Time: 07/27/17 11:35 PM   Result Value Ref Range    WBC 10.4 4.8 - 10.8 K/uL    RBC 4.05 (L) 4.20 - 5.40 M/uL    HGB 12.3 12.0 - 16.0 g/dL    HCT 47.836.0 (L) 29.537.0 - 47.0 %    MCV 88.9 81.0 - 100.0 FL    MCH 30.4 27.0 - 31.0 PG    MCHC 34.2 30.5 - 36.0 g/dL    RDW 62.112.4 30.811.4 - 65.714.6 %    PLATELET 372 122 - 400 K/uL    MPV 8.6 (L) 10.2 - 12.7 FL    NEUTROPHILS 67 42.2 - 75.2 %    LYMPHOCYTES 23 20.5 - 51.1 %    MONOCYTES 8 1.7 - 10.0 %    EOSINOPHILS 2 0.0 - 2.0 %    BASOPHILS 0 0.0 - 1.0 %    ABS. NEUTROPHILS 7.0 2.0 - 8.1 K/UL    ABS. LYMPHOCYTES 2.4 1.0 - 5.5 K/UL    ABS. MONOCYTES 0.8 0.1 - 1.0 K/UL    ABS. EOSINOPHILS 0.2 0.0 - 0.2 K/UL    ABS. BASOPHILS 0.0 0.0 - 0.1 K/UL    DF AUTOMATED     TYPE & SCREEN    Collection Time: 07/27/17 11:35 PM   Result Value Ref Range    Crossmatch Expiration 07/30/2017     ABO/Rh(D) A POSITIVE     Antibody screen NEG        Patient Instructions:   Current Discharge Medication List      START taking these medications    Details   methylergonovine (METHERGINE) 0.2 mg tablet Take 1 Tab by mouth every six (6) hours.   Qty: 6 Tab, Refills: 0         CONTINUE these medications which have NOT CHANGED    Details   buPROPion XL (WELLBUTRIN XL) 300 mg XL tablet Take 300 mg by mouth every morning.      sertraline (ZOLOFT) 25 mg tablet Take 25 mg by mouth daily.      ARIPiprazole (ABILIFY) 15 mg tablet Take 15 mg by mouth daily.      OXcarbazepine (TRILEPTAL) 150 mg tablet Take 600 mg by mouth two (2) times a day.      dextroamphetamine-amphetamine (ADDERALL) 30 mg tablet Take  30 mg by mouth two (2) times a day.      ALPRAZolam (XANAX) 1 mg tablet Take 0.5 mg by mouth nightly as needed for Anxiety.              Activity: No sex, douching, or tampons for 6 weeks or as directed by your physician. No heavy lifting for 6 weeks. No driving while taking pain medication.  Diet: Resume pre-hospital diet  Wound Care: None needed    Follow-up Appointments   Procedures   ??? FOLLOW UP VISIT Appointment in: One Week     Standing Status:   Standing     Number of Occurrences:   1     Order Specific Question:   Appointment in     Answer:   One Week        Signed By:  Lowella PettiesJames K. Maisen Schmit, MD     July 28, 2017

## 2017-07-28 NOTE — Progress Notes (Signed)
Received pt from ED via w/c.  Pt oriented to room and personnel.  Pt understands that she is to remain NPO for a D&C in the AM.  Pt denies pain or bleeding at this time.   Standards of care initiated.

## 2017-07-28 NOTE — Progress Notes (Signed)
Bedside, Verbal and Written shift change report given to K. Justin Mendonnelly, RN (Cabin crewoncoming nurse) by Marlou PorchSusan P Drobysh, RN   (offgoing nurse). Report included the following information SBAR, Kardex, Procedure Summary, Intake/Output, MAR and Recent Results.

## 2017-07-28 NOTE — Progress Notes (Signed)
[facility]

## 2017-07-28 NOTE — Anesthesia Pre-Procedure Evaluation (Signed)
Anesthetic History   No history of anesthetic complications            Review of Systems / Medical History  Patient summary reviewed, nursing notes reviewed and pertinent labs reviewed    Pulmonary  Within defined limits                 Neuro/Psych         Psychiatric history    Comments: Depression, Anxiety, Fibromyalgia Cardiovascular  Within defined limits                Exercise tolerance: >4 METS     GI/Hepatic/Renal  Within defined limits              Endo/Other  Within defined limits           Other Findings              Physical Exam    Airway  Mallampati: II  TM Distance: 4 - 6 cm  Neck ROM: normal range of motion   Mouth opening: Normal     Cardiovascular  Regular rate and rhythm,  S1 and S2 normal,  no murmur, click, rub, or gallop             Dental  No notable dental hx       Pulmonary  Breath sounds clear to auscultation               Abdominal  GI exam deferred       Other Findings            Anesthetic Plan    ASA: 2, emergent  Anesthesia type: general          Induction: Intravenous  Anesthetic plan and risks discussed with: Patient

## 2017-07-28 NOTE — Other (Signed)
TRANSFER - OUT REPORT:    Verbal report given to Hulen SkainsKimberly Connely (name) on [name]  [facility] post - [facility]       Report consisted of patient???s Situation, Background, Assessment and   Recommendations(SBAR).     Information from the following report(s) OR Summary, Procedure Summary and Med Rec Status was reviewed with the receiving nurse.    Lines:   Peripheral IV 07/28/17 Left Hand (Active)   Site Assessment Clean, dry, & intact 07/28/2017  3:21 AM   Phlebitis Assessment 0 07/28/2017  3:21 AM   Infiltration Assessment 0 07/28/2017  3:21 AM   Dressing Status Clean, dry, & intact 07/28/2017  3:21 AM   Dressing Type Transparent 07/28/2017  3:21 AM   Hub Color/Line Status Pink 07/28/2017  3:21 AM   Action Taken Blood drawn 07/28/2017  2:01 AM   Alcohol Cap Used Yes 07/28/2017  3:21 AM        Opportunity for questions and clarification was provided.      Patient transported with:   Registered Nurse

## 2017-07-28 NOTE — Brief Op Note (Signed)
BRIEF OPERATIVE NOTE    Date of Procedure: 07/28/2017   Preoperative Diagnosis: Missed abortion [O02.1]  Postoperative Diagnosis: Missed abortion [O02.1]    Procedure(s):  DILATATION AND CURETTAGE WITH SUCTION  Surgeon(s) and Role:     * Wealthy Danielski, Lowella PettiesJames K., MD - Primary         Surgical Assistant: none    Surgical Staff:  Circ-1: Lanier ClamPfohl, Kristi A, RN  Scrub Tech-1: Margarita Ranaurcan, Sarah A  Event Time In Time Out   Incision Start 07/28/2017 0829    Incision Close 07/28/2017 0835      Anesthesia: General   Estimated Blood Loss: 20 cc  Specimens:   ID Type Source Tests Collected by Time Destination   A : Products of conception Preservative Uterus  Wynelle ClevelandBrockunier, Jissell Trafton K., MD 07/28/2017 [phone] Pathology      Findings: uterus 8 wks size at start of procedure and 6 wks size at end of procedure, mod EMC   Complications: none  Implants: * No implants in log *

## 2017-07-28 NOTE — Other (Signed)
TRANSFER - OUT REPORT:    Verbal report given to Lavada MesiSusan Drobysh RN on [facility] LoganLeah Marie [facility]       Report consisted of patient???s Situation, Background, Assessment and   Recommendations(SBAR).     Information from the following report(s) SBAR, ED Summary, [facility] and Recent Results was reviewed with the receiving nurse.    Lines:   Peripheral IV 07/28/17 Left Hand (Active)   Site Assessment Clean, dry, & intact 07/28/2017  2:01 AM   Phlebitis Assessment 0 07/28/2017  2:01 AM   Infiltration Assessment 0 07/28/2017  2:01 AM   Dressing Status Clean, dry, & intact 07/28/2017  2:01 AM   Dressing Type Transparent 07/28/2017  2:01 AM   Hub Color/Line Status Blue 07/28/2017  2:01 AM   Action Taken Blood drawn 07/28/2017  2:01 AM        Opportunity for questions and clarification was provided.      Patient transported with:   Tech and all belongings.

## 2017-07-28 NOTE — Op Note (Signed)
[place] REPORT    Erin Robinson, Erin Robinson  MR#: [id number]  DOB: March 09, 1981  ACCOUNT #: [id number]   DATE OF SERVICE: 07/28/2017    PREOPERATIVE DIAGNOSIS:  Missed abortion.    POSTOPERATIVE DIAGNOSIS:  Missed abortion.    PROCEDURE PERFORMED:  Suction, dilatation and curettage.    SURGEON:  Kearney Hard, MD    ASSISTANT:  none    ANESTHESIA:  MAC with Dr. Albertina Senegal.    ESTIMATED BLOOD LOSS:  20 mL.    SPECIMENS REMOVED:  POC    COMPLICATIONS:  none    IMPLANTS:  none    DRAINS:  None.    CONDITION:  The patient leaving the operating room was stable.    FINDINGS:  External genitalia within normal limits.  Cervix closed, clean.  Uterus was approximately 8-week size at the beginning of operative procedure and 6-week size at end of operative procedure.  There was a moderate amount of endometrial curettings.  The adnexa palpable within normal limits.    DESCRIPTION OF PROCEDURE:  After administration of anesthesia, the patient was placed into a dorsal lithotomy position and prepped and draped in the usual sterile manner, following which the patient was catheterized and examination under anesthesia was then performed.  Next, a weighted speculum was placed in the posterior vaginal vault and the anterior wall of the vagina was retracted with Sims retractor.  The anterior lip of the cervix was grasped with tenaculum and brought anteriorly.  Endocervical canal was then progressively dilated, following which a #8 curved suction cannula was placed into the uterine cavity, in a systematic manner, all quadrants of the uterine cavity curetted and retrieved mild amount of endometrial tissue.  Following this, suction curette was removed.  Medium size sharp curette was introduced into the uterine cavity and in a systematic manner again all quadrants of the uterine cavity were curetted.  This retrieved a scant to moderate amount of endometrial tissue.  A  second pass with the suction curette also retrieved scant amount endometrial tissue.    This concluded the operative procedures.  All instruments were removed from the vagina.  All instrument and sponge counts were reported as correct and the patient left the operating room to the recovery room in satisfactory condition.      Kearney Hard, MD       Hiram Comber / [place]  D: 07/28/2017 09:00     T: 07/28/2017 15:35  JOB #: [id number]

## 2017-07-28 NOTE — Other (Signed)
TRANSFER - IN REPORT:    Verbal report received from Mallie DartingKimberly Connelly (name) on Erin Robinson  being [facility] ordered procedure      Report consisted of patient???s Situation, Background, Assessment and   Recommendations(SBAR).     Information from the following report(s) MAR, Recent Results and Med Rec Status was reviewed with the receiving nurse.    Opportunity for questions and clarification was provided.      Assessment completed upon patient???s arrival to unit and care assumed.

## 2017-07-28 NOTE — Op Note (Signed)
Dictated # JB

## 2018-02-19 ENCOUNTER — Emergency Department: Admit: 2018-02-19 | Payer: Medicaid HMO

## 2018-02-19 ENCOUNTER — Inpatient Hospital Stay
Admit: 2018-02-19 | Discharge: 2018-02-19 | Disposition: A | Discharge status: Home / Self Care | Payer: Medicaid HMO | Attending: Emergency Medicine

## 2018-02-19 DIAGNOSIS — R55 Syncope and collapse: Secondary | ICD-10-CM

## 2018-02-19 LAB — CBC WITH AUTOMATED DIFF
ABS. BASOPHILS: 0 10*3/uL (ref 0.0–0.1)
ABS. EOSINOPHILS: 0.1 10*3/uL (ref 0.0–0.2)
ABS. LYMPHOCYTES: 1.5 10*3/uL (ref 1.0–5.5)
ABS. MONOCYTES: 0.6 10*3/uL (ref 0.1–1.0)
ABS. NEUTROPHILS: 4.2 10*3/uL (ref 2.0–8.1)
BASOPHILS: 1 % (ref 0.0–1.0)
EOSINOPHILS: 2 % (ref 0.0–2.0)
HCT: 37.3 % (ref 37.0–47.0)
HGB: 11.9 g/dL — ABNORMAL LOW (ref 12.0–16.0)
LYMPHOCYTES: 24 % (ref 20.5–51.1)
MCH: 26 PG — ABNORMAL LOW (ref 27.0–31.0)
MCHC: 31.9 g/dL (ref 30.5–36.0)
MCV: 81.6 FL (ref 81.0–100.0)
MONOCYTES: 9 % (ref 1.7–10.0)
MPV: 8.9 FL — ABNORMAL LOW (ref 10.2–12.7)
NEUTROPHILS: 64 % (ref 42.2–75.2)
PLATELET: 309 10*3/uL (ref 122–400)
RBC: 4.57 M/uL (ref 4.20–5.40)
RDW: 15 % — ABNORMAL HIGH (ref 11.4–14.6)
WBC: 6.5 10*3/uL (ref 4.8–10.8)

## 2018-02-19 LAB — URINALYSIS W/ RFLX MICROSCOPIC
Bilirubin, Urine: NEGATIVE
Bilirubin: NEGATIVE
Blood, Urine: NEGATIVE
Blood: NEGATIVE
Glucose, Ur: 100 mg/dL — AB
Glucose: 100 mg/dL — AB
Ketone: NEGATIVE mg/dL
Ketones, Urine: NEGATIVE mg/dL
Nitrite, Urine: NEGATIVE
Nitrites: NEGATIVE
Specific Gravity, UA: 1.02 (ref 1.005–1.030)
Specific gravity: 1.02 (ref 1.005–1.030)
Urobilinogen, UA, POCT: 0.2 EU/dL (ref 0.1–1.0)
Urobilinogen: 0.2 EU/dL (ref 0.1–1.0)
pH (UA): 6 (ref 5.0–8.0)
pH, UA: 6 (ref 5.0–8.0)

## 2018-02-19 LAB — METABOLIC PANEL, COMPREHENSIVE
A-G Ratio: 0.9 — ABNORMAL LOW (ref 1.0–1.5)
ALT (SGPT): 29 U/L (ref 12–78)
AST (SGOT): 20 U/L (ref 15–37)
Albumin: 3.8 g/dL (ref 3.4–5.0)
Alk. phosphatase: 66 U/L (ref 46–116)
Anion gap: 8 mmol/L (ref 8–20)
BUN: 8 mg/dL (ref 7–18)
Bilirubin, total: 0.3 mg/dL (ref 0.2–1.0)
CO2: 28 mmol/L (ref 21–32)
Calcium: 9.1 mg/dL (ref 8.5–10.1)
Chloride: 104 mmol/L (ref 98–107)
Creatinine: 0.94 mg/dL (ref 0.55–1.02)
GFR est AA: >60 mL/min/{1.73_m2} (ref >60)
GFR est non-AA: >60 mL/min/{1.73_m2} (ref >60)
Globulin: 4.2 g/dL (ref 2.5–5.0)
Glucose: 89 mg/dL (ref 74–106)
Potassium: 3.3 mmol/L — ABNORMAL LOW (ref 3.5–5.1)
Protein, total: 8 g/dL (ref 6.4–8.2)
Sodium: 140 mmol/L (ref 136–145)

## 2018-02-19 LAB — PROTHROMBIN TIME + INR
INR: 1 (ref 0.8–1.2)
Prothrombin time: 10.6 s (ref 9.6–11.0)

## 2018-02-19 LAB — URINE MICROSCOPIC

## 2018-02-19 LAB — PTT: aPTT: 24.7 s (ref 24.3–32.2)

## 2018-02-19 LAB — HCG QL SERUM
HCG, Ql.: NEGATIVE
HCG, Ql.: NEGATIVE

## 2018-02-19 LAB — CK
CK: 58 U/L (ref 26–192)
Total CK: 58 U/L (ref 26–192)

## 2018-02-19 LAB — TROPONIN I: Troponin-I, Qt.: <0.04 NG/ML (ref 0.00–0.05)

## 2018-02-19 LAB — MAGNESIUM
Magnesium: 1.9 mg/dL (ref 1.8–2.4)
Magnesium: 1.9 mg/dL (ref 1.8–2.4)

## 2018-02-19 LAB — COMPREHENSIVE METABOLIC PANEL
ALT: 29 U/L (ref 12–78)
AST: 20 U/L (ref 15–37)
Albumin/Globulin Ratio: 0.9 — ABNORMAL LOW (ref 1.0–1.5)
Albumin: 3.8 g/dL (ref 3.4–5.0)
Alkaline Phosphatase: 66 U/L (ref 46–116)
Anion Gap: 8 mmol/L (ref 8–20)
BUN: 8 mg/dL (ref 7–18)
CO2: 28 mmol/L (ref 21–32)
Calcium: 9.1 mg/dL (ref 8.5–10.1)
Chloride: 104 mmol/L (ref 98–107)
Creatinine: 0.94 mg/dL (ref 0.55–1.02)
EGFR IF NonAfrican American: >60 mL/min/{1.73_m2} (ref >60)
GFR African American: >60 mL/min/{1.73_m2} (ref >60)
Globulin: 4.2 g/dL (ref 2.5–5.0)
Glucose: 89 mg/dL (ref 74–106)
Potassium: 3.3 mmol/L — ABNORMAL LOW (ref 3.5–5.1)
Sodium: 140 mmol/L (ref 136–145)
Total Bilirubin: 0.3 mg/dL (ref 0.2–1.0)
Total Protein: 8 g/dL (ref 6.4–8.2)

## 2018-02-19 LAB — CBC WITH AUTO DIFFERENTIAL
Basophils %: 1 % (ref 0.0–1.0)
Basophils Absolute: 0 10*3/uL (ref 0.0–0.1)
Eosinophils %: 2 % (ref 0.0–2.0)
Eosinophils Absolute: 0.1 10*3/uL (ref 0.0–0.2)
Hematocrit: 37.3 % (ref 37.0–47.0)
Hemoglobin: 11.9 g/dL — ABNORMAL LOW (ref 12.0–16.0)
Lymphocytes %: 24 % (ref 20.5–51.1)
Lymphocytes Absolute: 1.5 10*3/uL (ref 1.0–5.5)
MCH: 26 PG — ABNORMAL LOW (ref 27.0–31.0)
MCHC: 31.9 g/dL (ref 30.5–36.0)
MCV: 81.6 FL (ref 81.0–100.0)
MPV: 8.9 FL — ABNORMAL LOW (ref 10.2–12.7)
Monocytes %: 9 % (ref 1.7–10.0)
Monocytes Absolute: 0.6 10*3/uL (ref 0.1–1.0)
Neutrophils %: 64 % (ref 42.2–75.2)
Neutrophils Absolute: 4.2 10*3/uL (ref 2.0–8.1)
Platelets: 309 10*3/uL (ref 122–400)
RBC: 4.57 M/uL (ref 4.20–5.40)
RDW: 15 % — ABNORMAL HIGH (ref 11.4–14.6)
WBC: 6.5 10*3/uL (ref 4.8–10.8)

## 2018-02-19 LAB — TROPONIN: Troponin I: <0.04 NG/ML (ref 0.00–0.05)

## 2018-02-19 LAB — PROTIME-INR
INR: 1 (ref 0.8–1.2)
Protime: 10.6 s (ref 9.6–11.0)

## 2018-02-19 LAB — APTT: aPTT: 24.7 s (ref 24.3–32.2)

## 2018-02-19 MED ORDER — POTASSIUM CHLORIDE SR 20 MEQ TAB, PARTICLES/CRYSTALS
20 mEq | ORAL | Status: AC
Start: 2018-02-19 — End: 2018-02-19
  Administered 2018-02-19: 15:00:00 via ORAL

## 2018-02-19 MED ORDER — SODIUM CHLORIDE 0.9 % IJ SYRG
Freq: Three times a day (TID) | INTRAMUSCULAR | Status: DC
Start: 2018-02-19 — End: 2018-02-19

## 2018-02-19 MED ORDER — SODIUM CHLORIDE 0.9% BOLUS IV
0.9 % | Freq: Once | INTRAVENOUS | Status: AC
Start: 2018-02-19 — End: 2018-02-19
  Administered 2018-02-19: 13:00:00 via INTRAVENOUS

## 2018-02-19 MED ORDER — SODIUM CHLORIDE 0.9 % IJ SYRG
INTRAMUSCULAR | Status: DC | PRN
Start: 2018-02-19 — End: 2018-02-19

## 2018-02-19 MED FILL — KLOR-CON M20 MEQ TABLET,EXTENDED RELEASE: 20 mEq | ORAL | Qty: 2

## 2018-02-19 MED FILL — SODIUM CHLORIDE 0.9 % IV: INTRAVENOUS | Qty: 1000

## 2018-02-19 MED FILL — NORMAL SALINE FLUSH 0.9 % INJECTION SYRINGE: INTRAMUSCULAR | Qty: 40

## 2018-02-19 NOTE — ED Notes (Signed)
Patient is awake, alert, and oriented, speech is clear and patient is  ready for discharge. Verbal and written discharge instructions provided and has the cognitive understanding of discharge instructions. Discharged home. All questions answered.

## 2018-02-19 NOTE — ED Notes (Signed)
Pt arrived via [facility] with a C/o Syncope and feeling tired. States she woke up looking at the ceiling. 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 Denies N/V/D. EMS had a glucose of 74 on scene. Ate cereal at 0630. States she feels tired now and has been tired for a few days.

## 2018-02-19 NOTE — ED Notes (Signed)
CXR done at bedside
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
## 2018-02-19 NOTE — ED Notes (Signed)
Pt resting. No c/o.

## 2018-02-19 NOTE — ED Notes (Signed)
Resting. Ate 1/2 sandwich and a small bag of chips.

## 2018-02-19 NOTE — ED Triage Notes (Signed)
Pt arrived via [facility] with a C/o Syncope and feeling tired. States she woke up looking at the ceiling. 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 Denies N/V/D. EMS had a glucose of 74 on scene. Ate cereal at 0630. States she feels tired now and has been tired for a few days.

## 2018-02-19 NOTE — ED Notes (Signed)
Resting. Ate 1/2 sandwich and a small bag of chips.
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
## 2018-02-22 LAB — EKG, 12 LEAD, INITIAL
Atrial Rate: 75 {beats}/min
Calculated P Axis: 67 degrees
Calculated R Axis: 53 degrees
Calculated T Axis: 41 degrees
Diagnosis: NORMAL
P-R Interval: 140 ms
Q-T Interval: 396 ms
QRS Duration: 92 ms
QTC Calculation (Bezet): 442 ms
Ventricular Rate: 75 {beats}/min

## 2018-02-22 LAB — EKG 12-LEAD
Atrial Rate: 75 {beats}/min
Diagnosis: NORMAL
P Axis: 67 degrees
P-R Interval: 140 ms
Q-T Interval: 396 ms
QRS Duration: 92 ms
QTc Calculation (Bazett): 442 ms
R Axis: 53 degrees
T Axis: 41 degrees
Ventricular Rate: 75 {beats}/min

## 2018-06-15 ENCOUNTER — Inpatient Hospital Stay
Admit: 2018-06-15 | Discharge: 2018-06-15 | Disposition: A | Discharge status: Home / Self Care | Payer: Medicaid HMO | Attending: Family Medicine

## 2018-06-15 DIAGNOSIS — G4700 Insomnia, unspecified: Secondary | ICD-10-CM

## 2018-06-15 MED ORDER — ALPRAZOLAM 0.25 MG TAB
0.25 mg | ORAL | Status: DC
Start: 2018-06-15 — End: 2018-06-15

## 2018-06-15 MED ORDER — ALPRAZOLAM 0.25 MG TAB
0.25 mg | ORAL | Status: AC
Start: 2018-06-15 — End: 2018-06-15

## 2018-06-15 MED ORDER — ALPRAZOLAM 1 MG TAB
1 mg | ORAL_TABLET | Freq: Three times a day (TID) | ORAL | 0 refills | Status: AC | PRN
Start: 2018-06-15 — End: ?

## 2018-06-15 MED ORDER — ALPRAZOLAM 0.25 MG TAB
0.25 mg | ORAL | Status: AC
Start: 2018-06-15 — End: 2018-06-15
  Administered 2018-06-15: 06:00:00 via ORAL

## 2018-06-15 MED FILL — ALPRAZOLAM 0.25 MG TAB: 0.25 mg | ORAL | Qty: 4

## 2018-06-15 NOTE — ED Notes (Signed)
Patient states has appointment with psychiatrist on Friday. Unable to do sooner secondary to work schedule.

## 2018-06-15 NOTE — ED Notes (Signed)
Patient presents to ED for evaluation of insomnia. Patient states weaning herself off her medications on her own. Last time she had medication was two weeks ago. Patient states hasn't had a good night's sleep in 5 days. Denies SI/HI. Patient states last night slept for hour intervals and tonight states "waking up every 2 minutes".

## 2018-06-15 NOTE — ED Provider Notes (Signed)
This is a 37 y/o female w/ hx anxiety and depression, who presents to ED w/c/o unable to sleep for 5 days. Pt says she weaned her off from all her psych med by herself almost 2wks ago. Pt says she has done this befire when she does not require to be on medications for months. Pt says Dr. Kathi LudwigSyed is her Psychiatrist and she has an appointment with him Friday. Pt is requesting to re-fill her xanax until then. Pt denies having having any thoughts of harming herself  oe someone else            Past Medical History:   Diagnosis Date   ??? ADD (attention deficit disorder)    ??? Anemia    ??? Anxiety    ??? Bleeding ulcer    ??? Fibromyalgia    ??? Idiopathic hypersomnia    ??? Lupus (HCC)    ??? Major depressive disorder    ??? Nicotine vapor product user        Past Surgical History:   Procedure Laterality Date   ??? HX CESAREAN SECTION      x1   ??? HX GASTRIC BYPASS  2010         History reviewed. No pertinent family history.    Social History     Socioeconomic History   ??? Marital status: DIVORCED     Spouse name: Not on file   ??? Number of children: Not on file   ??? Years of education: Not on file   ??? Highest education level: Not on file   Occupational History   ??? Not on file   Social Needs   ??? Financial resource strain: Not on file   ??? Food insecurity:     Worry: Not on file     Inability: Not on file   ??? Transportation needs:     Medical: Not on file     Non-medical: Not on file   Tobacco Use   ??? Smoking status: Former Smoker     Packs/day: 1.00     Years: 15.00     Pack years: 15.00     Last attempt to quit: 09/03/2016     Years since quitting: 1.7   ??? Smokeless tobacco: Current User   ??? Tobacco comment: vaps nicotine.    Substance and Sexual Activity   ??? Alcohol use: Yes     Comment: rarely glass of wine after work.    ??? Drug use: No   ??? Sexual activity: Not on file   Lifestyle   ??? Physical activity:     Days per week: Not on file     Minutes per session: Not on file   ??? Stress: Not on file   Relationships   ??? Social connections:     Talks  on phone: Not on file     Gets together: Not on file     Attends religious service: Not on file     Active member of club or organization: Not on file     Attends meetings of clubs or organizations: Not on file     Relationship status: Not on file   ??? Intimate partner violence:     Fear of current or ex partner: Not on file     Emotionally abused: Not on file     Physically abused: Not on file     Forced sexual activity: Not on file   Other Topics Concern   ??? Military Service Not Asked   ???  Blood Transfusions Not Asked   ??? Caffeine Concern Not Asked   ??? Occupational Exposure Not Asked   ??? Hobby Hazards Not Asked   ??? Sleep Concern Not Asked   ??? Stress Concern Not Asked   ??? Weight Concern Not Asked   ??? Special Diet Not Asked   ??? Back Care Not Asked   ??? Exercise Not Asked   ??? Bike Helmet Not Asked   ??? Seat Belt Not Asked   ??? Self-Exams Not Asked   Social History Narrative   ??? Not on file         ALLERGIES: Patient has no known allergies.    Review of Systems   Constitutional: Negative for appetite change, chills, diaphoresis and fatigue.   HENT: Negative for sneezing, sore throat and tinnitus.    Eyes: Negative for photophobia, discharge and redness.   Respiratory: Negative for cough, choking, chest tightness and shortness of breath.    Cardiovascular: Negative for chest pain, palpitations and leg swelling.   Neurological: Positive for syncope. Negative for dizziness, speech difficulty, numbness and headaches.   Psychiatric/Behavioral: Positive for confusion. Negative for agitation and hallucinations. The patient is nervous/anxious.        Vitals:    06/15/18 0114 06/15/18 0123   BP:  127/87   Pulse:  95   Resp:  18   Temp:  98.1 ??F (36.7 ??C)   SpO2:  95%   Weight: 69.4 kg (153 lb)    Height: 5\' 4"  (1.626 m)             Physical Exam     MDM       Procedures    <EMERGENCY DEPARTMENT CASE SUMMARY>    Impression: anxiety / insomnia     Plan: # [id number] reviewed and I gave her courtesy refill of xanax until she gets to  see her Dr. Kathi Ludwig on Friday. Pt urge to f/u w/ her appointment for further eval and management. I urge pt not stop or wean off her meds byherself    Final Impression/Diagnosis: Insomnia, anxiety     Patient condition at time of disposition: Stable      I have reviewed the following home medications:    Prior to Admission medications    Medication Sig Start Date End Date Taking? Authorizing Provider   ALPRAZolam Prudy Feeler) 1 mg tablet Take 1 Tab by mouth every eight (8) hours as needed for Anxiety. Max Daily Amount: 3 mg. 06/15/18  Yes Florian Buff, MD   armodafinil (NUVIGIL) 250 mg tab tablet Take 250 mg by mouth daily. 05/06/18   Other, Phys, MD   modafinil (PROVIGIL) 200 mg tablet Take 200 mg by mouth daily.    Other, Phys, MD   diazePAM (VALIUM) 5 mg tablet Take 5 mg by mouth as needed. 05/06/18   Other, Phys, MD   buPROPion XL (WELLBUTRIN XL) 300 mg XL tablet Take 300 mg by mouth every morning.    Other, Phys, MD   sertraline (ZOLOFT) 25 mg tablet Take 25 mg by mouth daily.    Other, Phys, MD   ARIPiprazole (ABILIFY) 15 mg tablet Take 15 mg by mouth daily.    Other, Phys, MD   OXcarbazepine (TRILEPTAL) 150 mg tablet Take 600 mg by mouth two (2) times a day.    Other, Phys, MD   dextroamphetamine-amphetamine (ADDERALL) 30 mg tablet Take 30 mg by mouth two (2) times a day.    Other, Phys, MD  Donnamarie Rossetti, MD

## 2018-06-15 NOTE — ED Notes (Signed)
Pt is awake, alert, and oriented, speech is clear and patient is able to ambulate and is ready for discharge. Verbal and written discharge instructions provided and pt has the cognitive understanding of the discharge instructions. Discharge home with family, all questions answered.

## 2018-06-15 NOTE — ED Notes (Signed)
Patient states has appointment with psychiatrist on Friday. Unable to do sooner secondary to work schedule.

## 2018-06-15 NOTE — ED Triage Notes (Signed)
Patient presents to ED for evaluation of insomnia. Patient states weaning herself off her medications on her own. Last time she had medication was two weeks ago. Patient states hasn't had a good night's sleep in 5 days. Denies SI/HI. Patient states last night slept for hour intervals and tonight states "waking up every 2 minutes".

## 2018-06-15 NOTE — ED Provider Notes (Signed)
This is a 37 y/o female w/ hx anxiety and depression, who presents to ED w/c/o unable to sleep for 5 days. Pt says she weaned her off from all her psych med by herself almost 2wks ago. Pt says she has done this befire when she does not require to be on medications for months. Pt says Dr. Kathi Ludwig is her Psychiatrist and she has an appointment with him Friday. Pt is requesting to re-fill her xanax until then. Pt denies having having any thoughts of harming herself  oe someone else            Past Medical History:   Diagnosis Date   ??? ADD (attention deficit disorder)    ??? Anemia    ??? Anxiety    ??? Bleeding ulcer    ??? Fibromyalgia    ??? Idiopathic hypersomnia    ??? Lupus (HCC)    ??? Major depressive disorder    ??? Nicotine vapor product user        Past Surgical History:   Procedure Laterality Date   ??? HX CESAREAN SECTION      x1   ??? HX GASTRIC BYPASS  2010         History reviewed. No pertinent family history.    Social History     Socioeconomic History   ??? Marital status: DIVORCED     Spouse name: Not on file   ??? Number of children: Not on file   ??? Years of education: Not on file   ??? Highest education level: Not on file   Occupational History   ??? Not on file   Social Needs   ??? Financial resource strain: Not on file   ??? Food insecurity:     Worry: Not on file     Inability: Not on file   ??? Transportation needs:     Medical: Not on file     Non-medical: Not on file   Tobacco Use   ??? Smoking status: Former Smoker     Packs/day: 1.00     Years: 15.00     Pack years: 15.00     Last attempt to quit: 09/03/2016     Years since quitting: 1.7   ??? Smokeless tobacco: Current User   ??? Tobacco comment: vaps nicotine.    Substance and Sexual Activity   ??? Alcohol use: Yes     Comment: rarely glass of wine after work.    ??? Drug use: No   ??? Sexual activity: Not on file   Lifestyle   ??? Physical activity:     Days per week: Not on file     Minutes per session: Not on file   ??? Stress: Not on file   Relationships   ??? Social connections:      Talks on phone: Not on file     Gets together: Not on file     Attends religious service: Not on file     Active member of club or organization: Not on file     Attends meetings of clubs or organizations: Not on file     Relationship status: Not on file   ??? Intimate partner violence:     Fear of current or ex partner: Not on file     Emotionally abused: Not on file     Physically abused: Not on file     Forced sexual activity: Not on file   Other Topics Concern   ??? Military Service Not Asked   ???  Blood Transfusions Not Asked   ??? Caffeine Concern Not Asked   ??? Occupational Exposure Not Asked   ??? Hobby Hazards Not Asked   ??? Sleep Concern Not Asked   ??? Stress Concern Not Asked   ??? Weight Concern Not Asked   ??? Special Diet Not Asked   ??? Back Care Not Asked   ??? Exercise Not Asked   ??? Bike Helmet Not Asked   ??? Seat Belt Not Asked   ??? Self-Exams Not Asked   Social History Narrative   ??? Not on file         ALLERGIES: Patient has no known allergies.    Review of Systems   Constitutional: Negative for appetite change, chills, diaphoresis and fatigue.   HENT: Negative for sneezing, sore throat and tinnitus.    Eyes: Negative for photophobia, discharge and redness.   Respiratory: Negative for cough, choking, chest tightness and shortness of breath.    Cardiovascular: Negative for chest pain, palpitations and leg swelling.   Neurological: Positive for syncope. Negative for dizziness, speech difficulty, numbness and headaches.   Psychiatric/Behavioral: Positive for confusion. Negative for agitation and hallucinations. The patient is nervous/anxious.        Vitals:    06/15/18 0114 06/15/18 0123   BP:  127/87   Pulse:  95   Resp:  18   Temp:  98.1 ??F (36.7 ??C)   SpO2:  95%   Weight: 69.4 kg (153 lb)    Height: 5\' 4"  (1.626 m)             Physical Exam     MDM       Procedures    <EMERGENCY DEPARTMENT CASE SUMMARY>    Impression: anxiety / insomnia     Plan: # [id number] reviewed and I gave her courtesy refill of xanax until  she gets to see her Dr. Kathi Ludwig on Friday. Pt urge to f/u w/ her appointment for further eval and management. I urge pt not stop or wean off her meds byherself    Final Impression/Diagnosis: Insomnia, anxiety     Patient condition at time of disposition: Stable      I have reviewed the following home medications:    Prior to Admission medications    Medication Sig Start Date End Date Taking? Authorizing Provider   ALPRAZolam Prudy Feeler) 1 mg tablet Take 1 Tab by mouth every eight (8) hours as needed for Anxiety. Max Daily Amount: 3 mg. 06/15/18  Yes Florian Buff, MD   armodafinil (NUVIGIL) 250 mg tab tablet Take 250 mg by mouth daily. 05/06/18   Other, Phys, MD   modafinil (PROVIGIL) 200 mg tablet Take 200 mg by mouth daily.    Other, Phys, MD   diazePAM (VALIUM) 5 mg tablet Take 5 mg by mouth as needed. 05/06/18   Other, Phys, MD   buPROPion XL (WELLBUTRIN XL) 300 mg XL tablet Take 300 mg by mouth every morning.    Other, Phys, MD   sertraline (ZOLOFT) 25 mg tablet Take 25 mg by mouth daily.    Other, Phys, MD   ARIPiprazole (ABILIFY) 15 mg tablet Take 15 mg by mouth daily.    Other, Phys, MD   OXcarbazepine (TRILEPTAL) 150 mg tablet Take 600 mg by mouth two (2) times a day.    Other, Phys, MD   dextroamphetamine-amphetamine (ADDERALL) 30 mg tablet Take 30 mg by mouth two (2) times a day.    Other, Phys, MD  Donnamarie Rossetti, MD

## 2019-07-24 ENCOUNTER — Emergency Department (HOSPITAL_BASED_OUTPATIENT_CLINIC_OR_DEPARTMENT_OTHER): Payer: Self-pay

## 2019-07-24 ENCOUNTER — Other Ambulatory Visit: Payer: Self-pay

## 2019-07-24 ENCOUNTER — Inpatient Hospital Stay (HOSPITAL_BASED_OUTPATIENT_CLINIC_OR_DEPARTMENT_OTHER)
Admission: EM | Admit: 2019-07-24 | Discharge: 2019-07-31 | DRG: 871 | Disposition: A | Discharge status: Home / Self Care | Payer: Self-pay | Attending: Student | Admitting: Student

## 2019-07-24 ENCOUNTER — Encounter (HOSPITAL_BASED_OUTPATIENT_CLINIC_OR_DEPARTMENT_OTHER): Payer: Self-pay | Admitting: Emergency Medicine

## 2019-07-24 DIAGNOSIS — F32A Depression, unspecified: Secondary | ICD-10-CM

## 2019-07-24 DIAGNOSIS — Z7289 Other problems related to lifestyle: Secondary | ICD-10-CM

## 2019-07-24 DIAGNOSIS — G9341 Metabolic encephalopathy: Secondary | ICD-10-CM | POA: Diagnosis present

## 2019-07-24 DIAGNOSIS — Z22322 Carrier or suspected carrier of Methicillin resistant Staphylococcus aureus: Secondary | ICD-10-CM

## 2019-07-24 DIAGNOSIS — F17210 Nicotine dependence, cigarettes, uncomplicated: Secondary | ICD-10-CM | POA: Diagnosis present

## 2019-07-24 DIAGNOSIS — E538 Deficiency of other specified B group vitamins: Secondary | ICD-10-CM | POA: Diagnosis present

## 2019-07-24 DIAGNOSIS — Z09 Encounter for follow-up examination after completed treatment for conditions other than malignant neoplasm: Secondary | ICD-10-CM

## 2019-07-24 DIAGNOSIS — D649 Anemia, unspecified: Secondary | ICD-10-CM

## 2019-07-24 DIAGNOSIS — Z6821 Body mass index (BMI) 21.0-21.9, adult: Secondary | ICD-10-CM

## 2019-07-24 DIAGNOSIS — F1910 Other psychoactive substance abuse, uncomplicated: Secondary | ICD-10-CM | POA: Diagnosis present

## 2019-07-24 DIAGNOSIS — F1590 Other stimulant use, unspecified, uncomplicated: Secondary | ICD-10-CM | POA: Diagnosis present

## 2019-07-24 DIAGNOSIS — G4700 Insomnia, unspecified: Secondary | ICD-10-CM | POA: Diagnosis present

## 2019-07-24 DIAGNOSIS — Z590 Homelessness: Secondary | ICD-10-CM

## 2019-07-24 DIAGNOSIS — F329 Major depressive disorder, single episode, unspecified: Secondary | ICD-10-CM | POA: Diagnosis present

## 2019-07-24 DIAGNOSIS — Z20828 Contact with and (suspected) exposure to other viral communicable diseases: Secondary | ICD-10-CM | POA: Diagnosis present

## 2019-07-24 DIAGNOSIS — Z79899 Other long term (current) drug therapy: Secondary | ICD-10-CM

## 2019-07-24 DIAGNOSIS — K56609 Unspecified intestinal obstruction, unspecified as to partial versus complete obstruction: Secondary | ICD-10-CM | POA: Diagnosis present

## 2019-07-24 DIAGNOSIS — A419 Sepsis, unspecified organism: Principal | ICD-10-CM | POA: Diagnosis present

## 2019-07-24 DIAGNOSIS — F419 Anxiety disorder, unspecified: Secondary | ICD-10-CM | POA: Diagnosis present

## 2019-07-24 DIAGNOSIS — Z9884 Bariatric surgery status: Secondary | ICD-10-CM

## 2019-07-24 DIAGNOSIS — D509 Iron deficiency anemia, unspecified: Secondary | ICD-10-CM | POA: Diagnosis present

## 2019-07-24 DIAGNOSIS — R64 Cachexia: Secondary | ICD-10-CM | POA: Diagnosis present

## 2019-07-24 DIAGNOSIS — K529 Noninfective gastroenteritis and colitis, unspecified: Secondary | ICD-10-CM | POA: Diagnosis present

## 2019-07-24 DIAGNOSIS — G43909 Migraine, unspecified, not intractable, without status migrainosus: Secondary | ICD-10-CM | POA: Diagnosis present

## 2019-07-24 DIAGNOSIS — F1490 Cocaine use, unspecified, uncomplicated: Secondary | ICD-10-CM | POA: Diagnosis present

## 2019-07-24 HISTORY — DX: Depression, unspecified: F32.A

## 2019-07-24 HISTORY — DX: Other psychoactive substance abuse, uncomplicated: F19.10

## 2019-07-24 HISTORY — DX: Anemia, unspecified: D64.9

## 2019-07-24 LAB — ETHANOL: Alcohol, Ethyl (B): <10 mg/dL (ref <10)

## 2019-07-24 LAB — CBC WITH DIFFERENTIAL/PLATELET
Abs Immature Granulocytes: 0.06 10*3/uL (ref 0.00–0.07)
Basophils Absolute: 0.1 10*3/uL (ref 0.0–0.1)
Basophils Relative: 0 %
Eosinophils Absolute: 0.1 10*3/uL (ref 0.0–0.5)
Eosinophils Relative: 0 %
HCT: 35.6 % — ABNORMAL LOW (ref 36.0–46.0)
Hemoglobin: 11.1 g/dL — ABNORMAL LOW (ref 12.0–15.0)
Immature Granulocytes: 0 %
Lymphocytes Relative: 6 %
Lymphs Abs: 1 10*3/uL (ref 0.7–4.0)
MCH: 26 pg (ref 26.0–34.0)
MCHC: 31.2 g/dL (ref 30.0–36.0)
MCV: 83.4 fL (ref 80.0–100.0)
Monocytes Absolute: 0.3 10*3/uL (ref 0.1–1.0)
Monocytes Relative: 2 %
Neutro Abs: 16.3 10*3/uL — ABNORMAL HIGH (ref 1.7–7.7)
Neutrophils Relative %: 92 %
Platelets: 366 10*3/uL (ref 150–400)
RBC: 4.27 MIL/uL (ref 3.87–5.11)
RDW: 14.8 % (ref 11.5–15.5)
Smear Review: NORMAL
WBC Morphology: INCREASED
WBC: 17.7 10*3/uL — ABNORMAL HIGH (ref 4.0–10.5)
nRBC: 0 % (ref 0.0–0.2)

## 2019-07-24 LAB — URINALYSIS, ROUTINE W REFLEX MICROSCOPIC
Bilirubin Urine: NEGATIVE
Glucose, UA: NEGATIVE mg/dL
Hgb urine dipstick: NEGATIVE
Ketones, ur: NEGATIVE mg/dL
Leukocytes,Ua: NEGATIVE
Nitrite: NEGATIVE
Protein, ur: 30 mg/dL — AB
Specific Gravity, Urine: >1.03 — ABNORMAL HIGH (ref 1.005–1.030)
pH: 6 (ref 5.0–8.0)

## 2019-07-24 LAB — COMPREHENSIVE METABOLIC PANEL
ALT: 17 U/L (ref 0–44)
AST: 19 U/L (ref 15–41)
Albumin: 3.5 g/dL (ref 3.5–5.0)
Alkaline Phosphatase: 60 U/L (ref 38–126)
Anion gap: 10 (ref 5–15)
BUN: 17 mg/dL (ref 6–20)
CO2: 25 mmol/L (ref 22–32)
Calcium: 9.1 mg/dL (ref 8.9–10.3)
Chloride: 100 mmol/L (ref 98–111)
Creatinine, Ser: 0.96 mg/dL (ref 0.44–1.00)
GFR calc Af Amer: >60 mL/min (ref >60)
GFR calc non Af Amer: >60 mL/min (ref >60)
Glucose, Bld: 126 mg/dL — ABNORMAL HIGH (ref 70–99)
Potassium: 4 mmol/L (ref 3.5–5.1)
Sodium: 135 mmol/L (ref 135–145)
Total Bilirubin: 0.7 mg/dL (ref 0.3–1.2)
Total Protein: 7.2 g/dL (ref 6.5–8.1)

## 2019-07-24 LAB — URINALYSIS, MICROSCOPIC (REFLEX): Squamous Epithelial / HPF: NONE SEEN (ref 0–5)

## 2019-07-24 LAB — LACTIC ACID, PLASMA
Lactic Acid, Venous: 3.2 mmol/L (ref 0.5–1.9)
Lactic Acid, Venous: 1.9 mmol/L (ref 0.5–1.9)
Lactic Acid, Venous: 1.4 mmol/L (ref 0.5–1.9)

## 2019-07-24 LAB — LIPASE, BLOOD: Lipase: 19 U/L (ref 11–51)

## 2019-07-24 LAB — RAPID URINE DRUG SCREEN, HOSP PERFORMED
Amphetamines: POSITIVE — AB
Barbiturates: NOT DETECTED
Benzodiazepines: NOT DETECTED
Cocaine: POSITIVE — AB
Opiates: POSITIVE — AB
Tetrahydrocannabinol: NOT DETECTED

## 2019-07-24 LAB — SARS CORONAVIRUS 2 (TAT 6-24 HRS): SARS Coronavirus 2: NEGATIVE

## 2019-07-24 LAB — HIV ANTIBODY (ROUTINE TESTING W REFLEX): HIV Screen 4th Generation wRfx: NONREACTIVE

## 2019-07-24 LAB — HCG, QUANTITATIVE, PREGNANCY: hCG, Beta Chain, Quant, S: <1 m[IU]/mL (ref <5)

## 2019-07-24 LAB — MRSA PCR SCREENING: MRSA by PCR: POSITIVE — AB

## 2019-07-24 MED ORDER — LACTATED RINGERS IV BOLUS
1000.0000 mL | Freq: Once | INTRAVENOUS | Status: AC
  Administered 2019-07-24: 16:00:00 1000 mL via INTRAVENOUS

## 2019-07-24 MED ORDER — ACETAMINOPHEN 650 MG RE SUPP: 650.0000 mg | Freq: Four times a day (QID) | RECTAL | Status: DC | PRN

## 2019-07-24 MED ORDER — FENTANYL CITRATE (PF) 100 MCG/2ML IJ SOLN
25.0000 ug | INTRAMUSCULAR | Status: DC | PRN
  Administered 2019-07-24 – 2019-07-31 (×12): 25 ug via INTRAVENOUS
  Filled 2019-07-24 (×12): qty 2

## 2019-07-24 MED ORDER — METRONIDAZOLE IN NACL 5-0.79 MG/ML-% IV SOLN
500.0000 mg | Freq: Three times a day (TID) | INTRAVENOUS | Status: DC
  Administered 2019-07-24 – 2019-07-30 (×18): 500 mg via INTRAVENOUS
  Filled 2019-07-24 (×18): qty 100

## 2019-07-24 MED ORDER — TRAMADOL HCL 50 MG PO TABS
50.0000 mg | ORAL_TABLET | Freq: Four times a day (QID) | ORAL | Status: DC | PRN
  Administered 2019-07-26 – 2019-07-30 (×4): 50 mg via ORAL
  Filled 2019-07-24 (×5): qty 1

## 2019-07-24 MED ORDER — SODIUM CHLORIDE 0.9 % IV BOLUS
1000.0000 mL | Freq: Once | INTRAVENOUS | Status: AC
  Administered 2019-07-24: 08:00:00 1000 mL via INTRAVENOUS

## 2019-07-24 MED ORDER — ONDANSETRON HCL 4 MG/2ML IJ SOLN
4.0000 mg | Freq: Four times a day (QID) | INTRAMUSCULAR | Status: DC | PRN
  Administered 2019-07-24 – 2019-07-29 (×8): 4 mg via INTRAVENOUS
  Filled 2019-07-24 (×8): qty 2

## 2019-07-24 MED ORDER — ONDANSETRON HCL 4 MG PO TABS
4.0000 mg | ORAL_TABLET | Freq: Four times a day (QID) | ORAL | Status: DC | PRN
  Administered 2019-07-28 – 2019-07-31 (×2): 4 mg via ORAL
  Filled 2019-07-24 (×2): qty 1

## 2019-07-24 MED ORDER — SODIUM CHLORIDE 0.9 % IV SOLN
2.0000 g | INTRAVENOUS | Status: DC
  Administered 2019-07-24 – 2019-07-29 (×6): 2 g via INTRAVENOUS
  Filled 2019-07-24 (×4): qty 2
  Filled 2019-07-24: qty 20
  Filled 2019-07-24 (×2): qty 2

## 2019-07-24 MED ORDER — SODIUM CHLORIDE 0.9 % IV BOLUS
1000.0000 mL | Freq: Once | INTRAVENOUS | Status: AC
  Administered 2019-07-24: 06:00:00 1000 mL via INTRAVENOUS

## 2019-07-24 MED ORDER — ONDANSETRON HCL 4 MG/2ML IJ SOLN
4.0000 mg | Freq: Once | INTRAMUSCULAR | Status: AC
  Administered 2019-07-24: 06:00:00 4 mg via INTRAVENOUS
  Filled 2019-07-24: qty 2

## 2019-07-24 MED ORDER — FENTANYL CITRATE (PF) 100 MCG/2ML IJ SOLN
100.0000 ug | Freq: Once | INTRAMUSCULAR | Status: AC
  Administered 2019-07-24: 08:00:00 100 ug via INTRAVENOUS
  Filled 2019-07-24: qty 2

## 2019-07-24 MED ORDER — PIPERACILLIN-TAZOBACTAM 3.375 G IVPB 30 MIN
3.3750 g | Freq: Once | INTRAVENOUS | Status: AC
  Administered 2019-07-24: 08:00:00 3.375 g via INTRAVENOUS
  Filled 2019-07-24 (×2): qty 50

## 2019-07-24 MED ORDER — KETOROLAC TROMETHAMINE 15 MG/ML IJ SOLN
15.0000 mg | Freq: Four times a day (QID) | INTRAMUSCULAR | Status: AC | PRN
  Administered 2019-07-24 – 2019-07-26 (×5): 15 mg via INTRAVENOUS
  Filled 2019-07-24 (×7): qty 1

## 2019-07-24 MED ORDER — VANCOMYCIN HCL IN DEXTROSE 1-5 GM/200ML-% IV SOLN
1000.0000 mg | Freq: Once | INTRAVENOUS | Status: AC
  Administered 2019-07-24: 08:00:00 1000 mg via INTRAVENOUS
  Filled 2019-07-24: qty 200

## 2019-07-24 MED ORDER — ENOXAPARIN SODIUM 40 MG/0.4ML ~~LOC~~ SOLN
40.0000 mg | SUBCUTANEOUS | Status: DC
  Administered 2019-07-25 – 2019-07-26 (×2): 40 mg via SUBCUTANEOUS
  Filled 2019-07-24 (×7): qty 0.4

## 2019-07-24 MED ORDER — LACTATED RINGERS IV BOLUS (SEPSIS)
500.0000 mL | Freq: Once | INTRAVENOUS | Status: AC
  Administered 2019-07-24: 13:00:00 500 mL via INTRAVENOUS

## 2019-07-24 MED ORDER — THIAMINE HCL 100 MG/ML IJ SOLN
100.0000 mg | Freq: Every day | INTRAMUSCULAR | Status: DC
  Administered 2019-07-24 – 2019-07-28 (×5): 100 mg via INTRAVENOUS
  Filled 2019-07-24 (×5): qty 2

## 2019-07-24 MED ORDER — CHLORHEXIDINE GLUCONATE CLOTH 2 % EX PADS: 6.0000 | MEDICATED_PAD | Freq: Every day | CUTANEOUS | Status: DC

## 2019-07-24 MED ORDER — LACTATED RINGERS IV SOLN
INTRAVENOUS | Status: DC
  Administered 2019-07-24 – 2019-07-26 (×4): via INTRAVENOUS

## 2019-07-24 MED ORDER — LACTATED RINGERS IV BOLUS
1000.0000 mL | Freq: Once | INTRAVENOUS | Status: AC
  Administered 2019-07-24: 09:00:00 1000 mL via INTRAVENOUS

## 2019-07-24 MED ORDER — IOHEXOL 300 MG/ML  SOLN
100.0000 mL | Freq: Once | INTRAMUSCULAR | Status: AC | PRN
  Administered 2019-07-24: 07:00:00 100 mL via INTRAVENOUS

## 2019-07-24 MED ORDER — ACETAMINOPHEN 325 MG PO TABS
650.0000 mg | ORAL_TABLET | Freq: Four times a day (QID) | ORAL | Status: DC | PRN
  Administered 2019-07-24 – 2019-07-31 (×3): 650 mg via ORAL
  Filled 2019-07-24 (×4): qty 2

## 2019-07-24 NOTE — ED Notes (Addendum)
Speech, mumbled, states pain worse when she moves and will eased off when she is still, lying on left side, refuses to lay on back

## 2019-07-24 NOTE — ED Notes (Signed)
Malodorous urine, dark orange in color with white discharge.

## 2019-07-24 NOTE — ED Provider Notes (Signed)
I received this patient in signout from Dr. Florina Ou.  Briefly, history of IV drug use presented with abdominal pain.  At time of signout, her work-up had been notable for leukocytosis, normal LFTs, lactate of 3.2, CT pending.  She had been given broad-spectrum antibiotics and blood cultures added.  CT shows wall thickening in the small bowel with some dilated bowel loops suggestive of enteritis with possible ileus versus SBO.  I suspect likely infectious ileus based on associated lab work.  Given her ongoing pain and abnormal vital signs, recommended admission.  I have added 1/3 L of IV fluids.  She has had mild improvement in blood pressure with MAP 65. Discussed w/ [facility], Dr. Tyrell Antonio at [facility]. She requested gen surgery consult. Discussed w/ Dr. Harlow Asa; they will see pt in consultation. Pt transferred for further care.  CRITICAL CARE Performed by: Wenda Overland Little   Total critical care time: 30 minutes  Critical care time was exclusive of separately billable procedures and treating other patients.  Critical care was necessary to treat or prevent imminent or life-threatening deterioration.  Critical care was time spent personally by me on the following activities: development of treatment plan with patient and/or surrogate as well as nursing, discussions with consultants, evaluation of patient's response to treatment, examination of patient, obtaining history from patient or surrogate, ordering and performing treatments and interventions, ordering and review of laboratory studies, ordering and review of radiographic studies, pulse oximetry and re-evaluation of patient's condition.    Little, Wenda Overland, MD 07/24/19 [phone]

## 2019-07-24 NOTE — ED Notes (Signed)
Report given to Hazel RN with [facility]  

## 2019-07-24 NOTE — ED Provider Notes (Signed)
[facility] Provider Note: Colleen Dell, MD, FACEP  CSN: [id number] MRN: [id number] ARRIVAL: 07/24/19 at 0431 ROOM: 1232/1232-01   CHIEF COMPLAINT  Abdominal Pain  Level 5 caveat: Altered mental status; incomprehensible speech HISTORY OF PRESENT ILLNESS  07/24/19 5:22 AM Colleen Ramirez is a 38 y.o. female without known significant past medical history although patient is not currently able to give a detailed past medical history.  She is here with about 24 hours of abdominal pain which she states is severe (10 out of 10), it is located diffusely but most prominent in the right lower quadrant.  She is unable to characterize the pain to me.  She is able to deny nausea or vomiting but states she did have some diarrhea about 15 hours ago.  She is afebrile but noted to be tachycardic on arrival.   Past Medical History:  Diagnosis Date  . Anemia   . Depression   . Drug abuse [facility])     Past Surgical History:  Procedure Laterality Date  . CESAREAN SECTION    . GASTRIC BYPASS      No family history on file.  Social History   Tobacco Use  . Smoking status: Current Every Day Smoker    Types: Cigarettes  . Smokeless tobacco: Never Used  Substance Use Topics  . Alcohol use: Yes  . Drug use: Yes    Types: IV, Methamphetamines    Prior to Admission medications   Medication Sig Start Date End Date Taking? Authorizing Provider  amphetamine-dextroamphetamine (ADDERALL) 20 MG tablet Take 20-40 mg by mouth 2 (two) times daily. Take 2 tablets ( ) in the morning and take 1 tablet ( ) in the afternoon    [provider]    Allergies Patient has no known allergies.   REVIEW OF SYSTEMS     PHYSICAL EXAMINATION  Initial Vital Signs Blood pressure 116/83, pulse (!) 114, temperature 98.9 F (37.2 C), temperature source Oral, resp. rate 20, height 5' 3.5" (1.613 m), weight 54.4 kg, last menstrual period 07/10/2019, SpO2 100 %.  Examination General:  Well-developed, cachectic female in no acute distress; appears older than age of record HENT: normocephalic; scabbed lesions of skin of face Eyes: pupils equal, round and reactive to light; extraocular muscles grossly intact Neck: supple Heart: regular rate and rhythm; tachycardia Lungs: clear to auscultation bilaterally Abdomen: soft; nondistended; diffusely tender most prominent in the right lower quadrant; cannot assess for masses due to patient's inability to lie flat or sit up straight bowel sounds present Extremities: No deformity; full range of motion; pulses normal Neurologic: Awake, alert; slurred, often incomprehensible speech; motor function intact in all extremities and symmetric; no facial droop Skin: Warm and dry Psychiatric: Moaning; grimacing   RESULTS  Summary of this visit's results, reviewed and interpreted by myself:   EKG Interpretation  Date/Time:    Ventricular Rate:    PR Interval:    QRS Duration:   QT Interval:    QTC Calculation:   R Axis:     Text Interpretation:        Laboratory Studies: Results for orders placed or performed during the hospital encounter of 07/24/19 (from the past 24 hour(s))  hCG, quantitative, pregnancy     Status: None   Collection Time: 07/24/19  5:03 AM  Result Value Ref Range   hCG, Beta Chain, Quant, S <1 <5 mIU/mL  CBC with Differential     Status: Abnormal   Collection Time: 07/24/19  5:03 AM  Result  Value Ref Range   WBC 17.7 (H) 4.0 - 10.5 K/uL   RBC 4.27 3.87 - 5.11 MIL/uL   Hemoglobin 11.1 (L) 12.0 - 15.0 g/dL   HCT 35.0 (L) 09.3 - 81.8 %   MCV 83.4 80.0 - 100.0 fL   MCH 26.0 26.0 - 34.0 pg   MCHC 31.2 30.0 - 36.0 g/dL   RDW 29.9 37.1 - 69.6 %   Platelets 366 150 - 400 K/uL   nRBC 0.0 0.0 - 0.2 %   Neutrophils Relative % 92 %   Neutro Abs 16.3 (H) 1.7 - 7.7 K/uL   Lymphocytes Relative 6 %   Lymphs Abs 1.0 0.7 - 4.0 K/uL   Monocytes Relative 2 %   Monocytes Absolute 0.3 0.1 - 1.0 K/uL   Eosinophils  Relative 0 %   Eosinophils Absolute 0.1 0.0 - 0.5 K/uL   Basophils Relative 0 %   Basophils Absolute 0.1 0.0 - 0.1 K/uL   WBC Morphology INCREASED BANDS (>20% BANDS)    RBC Morphology MORPHOLOGY UNREMARKABLE    Smear Review Normal platelet morphology    Immature Granulocytes 0 %   Abs Immature Granulocytes 0.06 0.00 - 0.07 K/uL  Comprehensive metabolic panel     Status: Abnormal   Collection Time: 07/24/19  5:03 AM  Result Value Ref Range   Sodium 135 135 - 145 mmol/L   Potassium 4.0 3.5 - 5.1 mmol/L   Chloride 100 98 - 111 mmol/L   CO2 25 22 - 32 mmol/L   Glucose, Bld 126 (H) 70 - 99 mg/dL   BUN 17 6 - 20 mg/dL   Creatinine, Ser 7.89 0.44 - 1.00 mg/dL   Calcium 9.1 8.9 - 38.1 mg/dL   Total Protein 7.2 6.5 - 8.1 g/dL   Albumin 3.5 3.5 - 5.0 g/dL   AST 19 15 - 41 U/L   ALT 17 0 - 44 U/L   Alkaline Phosphatase 60 38 - 126 U/L   Total Bilirubin 0.7 0.3 - 1.2 mg/dL   GFR calc non Af Amer >60 >60 mL/min   GFR calc Af Amer >60 >60 mL/min   Anion gap 10 5 - 15  Lipase, blood     Status: None   Collection Time: 07/24/19  5:03 AM  Result Value Ref Range   Lipase 19 11 - 51 U/L  Ethanol     Status: None   Collection Time: 07/24/19  6:07 AM  Result Value Ref Range   Alcohol, Ethyl (B) <10 <10 mg/dL  Urinalysis, Routine w reflex microscopic     Status: Abnormal   Collection Time: 07/24/19  6:37 AM  Result Value Ref Range   Color, Urine YELLOW YELLOW   APPearance HAZY (A) CLEAR   Specific Gravity, Urine >1.030 (H) 1.005 - 1.030   pH 6.0 5.0 - 8.0   Glucose, UA NEGATIVE NEGATIVE mg/dL   Hgb urine dipstick NEGATIVE NEGATIVE   Bilirubin Urine NEGATIVE NEGATIVE   Ketones, ur NEGATIVE NEGATIVE mg/dL   Protein, ur 30 (A) NEGATIVE mg/dL   Nitrite NEGATIVE NEGATIVE   Leukocytes,Ua NEGATIVE NEGATIVE  Rapid urine drug screen (hospital performed)     Status: Abnormal   Collection Time: 07/24/19  6:37 AM  Result Value Ref Range   Opiates POSITIVE (A) NONE DETECTED   Cocaine POSITIVE (A)  NONE DETECTED   Benzodiazepines NONE DETECTED NONE DETECTED   Amphetamines POSITIVE (A) NONE DETECTED   Tetrahydrocannabinol NONE DETECTED NONE DETECTED   Barbiturates NONE DETECTED NONE DETECTED  Urinalysis, Microscopic (reflex)     Status: Abnormal   Collection Time: 07/24/19  6:37 AM  Result Value Ref Range   RBC / HPF 0-5 0 - 5 RBC/hpf   WBC, UA 6-10 0 - 5 WBC/hpf   Bacteria, UA FEW (A) NONE SEEN   Squamous Epithelial / LPF NONE SEEN 0 - 5  Lactate     Status: Abnormal   Collection Time: 07/24/19  6:43 AM  Result Value Ref Range   Lactic Acid, Venous 3.2 (HH) 0.5 - 1.9 mmol/L  Blood culture (routine x 2)     Status: None (Preliminary result)   Collection Time: 07/24/19  7:25 AM   Specimen: BLOOD RIGHT HAND  Result Value Ref Range   Specimen Description      BLOOD RIGHT HAND Performed at [facility], [address] [place], [place] [address]    Special Requests      BOTTLES DRAWN AEROBIC ONLY Blood Culture results may not be optimal due to an inadequate volume of blood received in culture bottles Performed at [facility], [place] [address], [place], [place] [address]    Culture PENDING    Report Status PENDING   SARS CORONAVIRUS 2 (TAT 6-24 HRS) Nasopharyngeal Nasopharyngeal Swab     Status: None   Collection Time: 07/24/19  7:33 AM   Specimen: Nasopharyngeal Swab  Result Value Ref Range   SARS Coronavirus 2 NEGATIVE NEGATIVE  MRSA PCR Screening     Status: Abnormal   Collection Time: 07/24/19 11:17 AM   Specimen: Nasal Mucosa; Nasopharyngeal  Result Value Ref Range   MRSA by PCR POSITIVE (A) NEGATIVE  HIV Antibody (routine testing w rflx)     Status: None   Collection Time: 07/24/19 11:55 AM  Result Value Ref Range   HIV Screen 4th Generation wRfx NON REACTIVE NON REACTIVE  Lactic acid, plasma     Status: None   Collection Time: 07/24/19 11:55 AM  Result Value Ref Range   Lactic Acid, Venous 1.9 0.5 - 1.9 mmol/L  Lactic acid, plasma     Status:  None   Collection Time: 07/24/19  2:15 PM  Result Value Ref Range   Lactic Acid, Venous 1.4 0.5 - 1.9 mmol/L   Imaging Studies: Ct Abdomen Pelvis W Contrast  Result Date: 07/24/2019 CLINICAL DATA:  Two days of abdominal pain. IV drug abuser. Patient was uncooperative and combative as had to be scanned in the decubital position. EXAM: CT ABDOMEN AND PELVIS WITH CONTRAST TECHNIQUE: Multidetector CT imaging of the abdomen and pelvis was performed using the standard protocol following bolus administration of intravenous contrast. CONTRAST:  175mL OMNIPAQUE IOHEXOL 300 MG/ML  SOLN COMPARISON:  None. FINDINGS: Suboptimal positioning as described above in this combative uncooperative patient. Lower chest: Lung bases are normal. Hepatobiliary: 2 adjacent gallstones present with the larger measuring 6-7 mm. Liver and biliary tree otherwise unremarkable. Pancreas: Unremarkable. Spleen: Normal. Adrenals/Urinary Tract: Adrenal glands are normal. Kidneys are normal in size without hydronephrosis or nephrolithiasis. Ureters and bladder are not well visualized. Stomach/Bowel: Multiple surgical sutures over the stomach small amount of contrast over the jejunum. Suggestion of gastrojejunostomy which is patent. Findings suggesting mild thickened irregular folds and wall thickening involving several more distal jejunal loops over the mid to lower abdomen. Is very difficult to evaluate remainder of the small bowel and colon due to lack of contrast and sparse peritoneal fat. Mild dilatation of a single small bowel loop over the midline upper abdomen  measuring 3.1 cm in diameter. There is a surgical suture line over a stool containing bowel loop in the anterior midline abdomen just right of midline as this may represent a small bowel loop versus loop of colon. Most of the colon is air and stool-filled without dilatation. Rectosigmoid colon is difficult to identify. Appendix not visualized. No evidence of free peritoneal air.  Vascular/Lymphatic: Minimal calcified plaque over the abdominal aorta. No adenopathy. Reproductive: Uterus is normal.  Ovaries are not well visualized. Other: No significant free fluid. Musculoskeletal: No acute findings. IMPRESSION: 1. Previous gastric bypass with suggestion of gastrojejunostomy which is patent. Suture line over a bowel loop in the right anterior mid abdomen. Several small bowel loops with irregular fold thickening/wall thickening with single dilated small bowel loop measuring 3.1 cm. Findings are likely due to a regional enteritis of infectious or inflammatory nature. There may be a degree of small-bowel obstruction versus ileus. Bowel is very difficult to evaluate due to only a small amount of contrast in the proximal small bowel and sparse peritoneal fat. 2.  Cholelithiasis. 3.  Aortic Atherosclerosis (ICD10-I70.0). Electronically Signed   By: Elberta Fortisaniel  Boyle M.D.   On: 07/24/2019 07:55    ED COURSE and MDM  Nursing notes, initial and subsequent vitals signs, including pulse oximetry, reviewed and interpreted by myself.  Vitals:   07/24/19 1830 07/24/19 1900 07/24/19 1953 07/24/19 2000  BP: 114/74 117/86  103/79  Pulse: 64 76  (!) 58  Resp: (!) 23 20  (!) 26  Temp:   97.9 F (36.6 C)   TempSrc:   Oral   SpO2: 100% 100%  100%  Weight:      Height:       Medications  enoxaparin (LOVENOX) injection 40 mg (40 mg Subcutaneous Not Given 07/24/19 1159)  lactated ringers infusion ( Intravenous Rate/Dose Verify 07/24/19 1800)  cefTRIAXone (ROCEPHIN) 2 g in sodium chloride 0.9 % 100 mL IVPB (0 g Intravenous Stopped 07/24/19 1357)  metroNIDAZOLE (FLAGYL) IVPB 500 mg (0 mg Intravenous Stopped 07/24/19 2049)  acetaminophen (TYLENOL) tablet 650 mg (650 mg Oral Given 07/24/19 1328)    Or  acetaminophen (TYLENOL) suppository 650 mg ( Rectal See Alternative 07/24/19 1328)  traMADol (ULTRAM) tablet 50 mg (has no administration in time range)  ketorolac (TORADOL) 15 MG/ML injection 15 mg (15 mg  Intravenous Given 07/24/19 1941)  ondansetron (ZOFRAN) tablet 4 mg ( Oral See Alternative 07/24/19 1940)    Or  ondansetron (ZOFRAN) injection 4 mg (4 mg Intravenous Given 07/24/19 1940)  thiamine (B-1) injection 100 mg (100 mg Intravenous Given 07/24/19 1213)  fentaNYL (SUBLIMAZE) injection 25 mcg (25 mcg Intravenous Given 07/24/19 1708)  Chlorhexidine Gluconate Cloth 2 % PADS 6 each (has no administration in time range)  ondansetron (ZOFRAN) injection 4 mg (4 mg Intravenous Given 07/24/19 0608)  sodium chloride 0.9 % bolus 1,000 mL (0 mLs Intravenous Stopped 07/24/19 0728)  sodium chloride 0.9 % bolus 1,000 mL (0 mLs Intravenous Stopped 07/24/19 0833)  iohexol (OMNIPAQUE) 300 MG/ML solution 100 mL (100 mLs Intravenous Contrast Given 07/24/19 0701)  piperacillin-tazobactam (ZOSYN) IVPB 3.375 g (0 g Intravenous Stopped 07/24/19 0814)  vancomycin (VANCOCIN) IVPB 1000 mg/200 mL premix (0 mg Intravenous Stopped 07/24/19 0921)  lactated ringers bolus 1,000 mL (0 mLs Intravenous Stopped 07/24/19 0937)  fentaNYL (SUBLIMAZE) injection 100 mcg (100 mcg Intravenous Given 07/24/19 0818)  lactated ringers bolus 500 mL (500 mLs Intravenous Bolus from Bag 07/24/19 1307)  lactated ringers bolus 1,000 mL (1,000 mLs Intravenous Bolus  from Bag 07/24/19 1544)   6:32 AM Patient's white count is 17.7 greater than 20% bands.  Blood cultures, lactic acid and additional fluid bolus ordered.  6:47 AM Rectal temp is 100.23F.  Patient's nurse able to get patient to admit to being an IV amphetamine abuser.  She also has a history of gastric bypass.  7:13 AM Patient was able to lie still for CT scan but at an awkward angle.  CT results are pending.  Given patient's elevated white count with bandemia as well as fever in the setting of IV drug abuse Zosyn and vancomycin have been ordered for possible intra-abdominal infection or sepsis. Patient checked out to Dr. Clarene Duke.   PROCEDURES  Procedures   CRITICAL CARE Performed by: Carlisle Beers Lukis Bunt Total critical care time: 30 minutes Critical care time was exclusive of separately billable procedures and treating other patients. Critical care was necessary to treat or prevent imminent or life-threatening deterioration. Critical care was time spent personally by me on the following activities: development of treatment plan with patient and/or surrogate as well as nursing, discussions with consultants, evaluation of patient's response to treatment, examination of patient, obtaining history from patient or surrogate, ordering and performing treatments and interventions, ordering and review of laboratory studies, ordering and review of radiographic studies, pulse oximetry and re-evaluation of patient's condition.    ED DIAGNOSES     ICD-10-CM   1. Enteritis  K52.9 DG Abd 1 View    DG Abd 1 View  2. SBO (small bowel obstruction) (HCC)  K56.609 DG Abd 1 View    DG Abd 1 View  3. Polysubstance abuse (HCC)  F19.10        Arne Schlender, Jonny Ruiz, MD 07/24/19 2235

## 2019-07-24 NOTE — ED Triage Notes (Signed)
Pt endorses abd pain since yesterday. Denies nausea, vomiting diarrhea.

## 2019-07-24 NOTE — ED Notes (Signed)
Report given to Raven RN, receiving nurse at [facility]

## 2019-07-24 NOTE — ED Notes (Signed)
ED Provider at bedside. 

## 2019-07-24 NOTE — H&P (Signed)
History and Physical  Colleen Ramirez ZOX:[id number] DOB: 06/16/1981 DOA: 07/24/2019  PCP: Patient, No Pcp Per Patient coming from: Home   I have personally briefly reviewed patient[facility]   Chief Complaint: Abdominal Pain.   HPI: Colleen Ramirez is a 38 y.o. female past medical history IV drug abuse, gastric bypass, [facility] complaining of abdominal pain.  Patient providing limited history.  She report abdominal pain, generalized that started 2 days prior to admission.  Accompanied with diarrhea.  She relate last episode of diarrhea was 1 day prior to admission.  She denies passing flatus.  She is sleepy, she is  mumbling words.  Evaluation in the ED: Patient was found to be febrile with T 100.9, tachycardic and SBP soft,  sodium 135, potassium 4.0, CO2 25, BUN 17, creatinine 0.9, liver function test normal, lactic acid 3.2.  White blood cell 17, hemoglobin 11, platelet 366, CT test negative.  S ARS coronavirus 2 pending.  UA 6-10 white blood cell, UDS positive for amphetamine, opioids, cocaine. CT abdomen and pelvis: Previous gastric bypass with suggestion of gastrojejunostomy which is patent. Suture line over a bowel loop in the right anterior mid abdomen. Several small bowel loops with irregular fold thickening/wall thickening with single dilated small bowel loop measuring 3.1 cm. Findings are likely due to a regional enteritis of infectious or inflammatory nature. There may be a degree of small-bowel obstruction versus ileus.   In the ED patient received 3 L of IV fluids, IV vancomycin, Zosyn and fentanyl.   Review of Systems: All systems reviewed and apart from history of presenting illness, are negative.  Past Medical History:  Diagnosis Date  . Anemia   . Depression   . Drug abuse [facility](HCC)    Past Surgical History:  Procedure Laterality Date  . CESAREAN SECTION    . GASTRIC BYPASS     Social History:  reports that she has been smoking  cigarettes. She has never used smokeless tobacco. She reports current alcohol use. She reports current drug use. Drugs: IV and Methamphetamines.   No Known Allergies  Family History;  Patient denies any medical history in her family.  Prior to Admission medications   Not on File   Physical Exam: Vitals:   07/24/19 0836 07/24/19 0856 07/24/19 0905 07/24/19 0930  BP: (!) 90/58  (!) 89/59 92/62  Pulse: (!) 120 (!) 119 (!) 120 (!) 118  Resp: 19 13 20 19   Temp:      TempSrc:      SpO2: 100% 100% 97% 98%  Weight:      Height:         General exam: Lethargic, sick appearing, no distress  Head, eyes and ENT: Nontraumatic and normocephalic. Pupils equally reacting to light and accommodation. Oral mucosa dry.  Neck: Supple. No JVD, carotid bruit or thyromegaly.  Lymphatics: No lymphadenopathy.  Respiratory system: Clear to auscultation. No increased work of breathing.  Cardiovascular system: S1 and S2 heard, Tachycardic RR. No JVD, murmurs, gallops, clicks or pedal edema.  Gastrointestinal system: Abdomen is non distend  soft and very tender to palpation.. No organomegaly or masses appreciated.  Central nervous system: Sleepy, follows some commands.   Extremities: Symmetric 5 x 5 power. Peripheral pulses symmetrically felt.   Skin: No rashes or acute findings.  Musculoskeletal system: Negative exam.  Psychiatry: cooperative.   Labs on Admission:  Basic Metabolic Panel: Recent Labs  Lab 07/24/19 0503  NA 135  K 4.0  CL 100  CO2 25  GLUCOSE 126*  BUN 17  CREATININE 0.96  CALCIUM 9.1   Liver Function Tests: Recent Labs  Lab 07/24/19 0503  AST 19  ALT 17  ALKPHOS 60  BILITOT 0.7  PROT 7.2  ALBUMIN 3.5   Recent Labs  Lab 07/24/19 0503  LIPASE 19   No results for input(s): AMMONIA in the last 168 hours. CBC: Recent Labs  Lab 07/24/19 0503  WBC 17.7*  NEUTROABS 16.3*  HGB 11.1*  HCT 35.6*  MCV 83.4  PLT 366   Cardiac Enzymes: No results for  input(s): CKTOTAL, CKMB, CKMBINDEX, TROPONINI in the last 168 hours.  BNP (last 3 results) No results for input(s): PROBNP in the last 8760 hours. CBG: No results for input(s): GLUCAP in the last 168 hours.  Radiological Exams on Admission: Ct Abdomen Pelvis W Contrast  Result Date: 07/24/2019 CLINICAL DATA:  Two days of abdominal pain. IV drug abuser. Patient was uncooperative and combative as had to be scanned in the decubital position. EXAM: CT ABDOMEN AND PELVIS WITH CONTRAST TECHNIQUE: Multidetector CT imaging of the abdomen and pelvis was performed using the standard protocol following bolus administration of intravenous contrast. CONTRAST:  189mL OMNIPAQUE IOHEXOL 300 MG/ML  SOLN COMPARISON:  None. FINDINGS: Suboptimal positioning as described above in this combative uncooperative patient. Lower chest: Lung bases are normal. Hepatobiliary: 2 adjacent gallstones present with the larger measuring 6-7 mm. Liver and biliary tree otherwise unremarkable. Pancreas: Unremarkable. Spleen: Normal. Adrenals/Urinary Tract: Adrenal glands are normal. Kidneys are normal in size without hydronephrosis or nephrolithiasis. Ureters and bladder are not well visualized. Stomach/Bowel: Multiple surgical sutures over the stomach small amount of contrast over the jejunum. Suggestion of gastrojejunostomy which is patent. Findings suggesting mild thickened irregular folds and wall thickening involving several more distal jejunal loops over the mid to lower abdomen. Is very difficult to evaluate remainder of the small bowel and colon due to lack of contrast and sparse peritoneal fat. Mild dilatation of a single small bowel loop over the midline upper abdomen measuring 3.1 cm in diameter. There is a surgical suture line over a stool containing bowel loop in the anterior midline abdomen just right of midline as this may represent a small bowel loop versus loop of colon. Most of the colon is air and stool-filled without  dilatation. Rectosigmoid colon is difficult to identify. Appendix not visualized. No evidence of free peritoneal air. Vascular/Lymphatic: Minimal calcified plaque over the abdominal aorta. No adenopathy. Reproductive: Uterus is normal.  Ovaries are not well visualized. Other: No significant free fluid. Musculoskeletal: No acute findings. IMPRESSION: 1. Previous gastric bypass with suggestion of gastrojejunostomy which is patent. Suture line over a bowel loop in the right anterior mid abdomen. Several small bowel loops with irregular fold thickening/wall thickening with single dilated small bowel loop measuring 3.1 cm. Findings are likely due to a regional enteritis of infectious or inflammatory nature. There may be a degree of small-bowel obstruction versus ileus. Bowel is very difficult to evaluate due to only a small amount of contrast in the proximal small bowel and sparse peritoneal fat. 2.  Cholelithiasis. 3.  Aortic Atherosclerosis (ICD10-I70.0). Electronically Signed   By: Marin Olp M.D.   On: 07/24/2019 07:55    EKG: Independently reviewed.   Assessment/Plan Active Problems:   Sepsis ([place])   Anemia   Depression   Drug abuse ([place])   Enteritis   1-Sepsis; related to enteritis vs SBO: Patient presents with fever, tachycardia,  hypotension, leukocytosis, lactic acidosis, encephalopathy. CT abdomen showed possible enteritis versus ileus versus a small bowel obstruction. -Follow blood cultures. -Trend lactic acid. -Continue with IV fluids. -Start Ceftriaxone and flagyl.  --IV bolus.   2-Enteritis; Vs SBO;  -NPO status.  -Supportive care.  -Last episode of diarrhea 24 hours ago.  -if develops diarrhea will send for stool culture, C diff.  -Ceftriaxone and flagyl.   3-IV drug use: She will need counseling.  SW consult.   4-Acute Metabolic encephalopathy;  Related to sepsis, infection.  Support care.   5-SARS Coronavirus 2 screening; results pending. PPE; N 95 , face shield.   6-Anemia; monitor. Check anemia panel.     DVT Prophylaxis: Lovenox Code Status: Full code Family Communication: no family at bedside Disposition Plan: Admit to step down unit, for treatment of sepsis, enteritis vs SBO.   Time spent; 75 minutes,   Alba Cory MD [facility]   07/24/2019, 11:23 AM

## 2019-07-24 NOTE — ED Notes (Addendum)
IV crystal meth user - phlebitis to L hand. States she's from [place] and her doctor sends her meds here? [place] address. Won't give name of facility she goes to - states Dr. Alanda Amass. States she has medications that she's supposed to take but ran out.  Attempted IV x1 in R forearm without success. Second attempt successful.
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
## 2019-07-24 NOTE — ED Notes (Signed)
Emergency contact is friend Jeneen Rinks [phone]

## 2019-07-24 NOTE — ED Notes (Signed)
Pt now reporting lapses in memory for unspecified about of time. Also reporting generalized body pain.   States she hasn't had access to RXs for a while - states hasn't been taking Wellbutrin, adderol and Abilify. States she uses crystal meth because its supposed to be similar to adderol?

## 2019-07-25 ENCOUNTER — Inpatient Hospital Stay (HOSPITAL_COMMUNITY): Payer: Self-pay

## 2019-07-25 DIAGNOSIS — K56609 Unspecified intestinal obstruction, unspecified as to partial versus complete obstruction: Secondary | ICD-10-CM

## 2019-07-25 DIAGNOSIS — D649 Anemia, unspecified: Secondary | ICD-10-CM

## 2019-07-25 LAB — CBC
HCT: 34.3 % — ABNORMAL LOW (ref 36.0–46.0)
Hemoglobin: 10.5 g/dL — ABNORMAL LOW (ref 12.0–15.0)
MCH: 25.9 pg — ABNORMAL LOW (ref 26.0–34.0)
MCHC: 30.6 g/dL (ref 30.0–36.0)
MCV: 84.7 fL (ref 80.0–100.0)
Platelets: 277 10*3/uL (ref 150–400)
RBC: 4.05 MIL/uL (ref 3.87–5.11)
RDW: 15 % (ref 11.5–15.5)
WBC: 20.2 10*3/uL — ABNORMAL HIGH (ref 4.0–10.5)
nRBC: 0 % (ref 0.0–0.2)

## 2019-07-25 LAB — FOLATE: Folate: 5.6 ng/mL — ABNORMAL LOW (ref >5.9)

## 2019-07-25 LAB — COMPREHENSIVE METABOLIC PANEL
ALT: 15 U/L (ref 0–44)
AST: 18 U/L (ref 15–41)
Albumin: 2.6 g/dL — ABNORMAL LOW (ref 3.5–5.0)
Alkaline Phosphatase: 57 U/L (ref 38–126)
Anion gap: 9 (ref 5–15)
BUN: 18 mg/dL (ref 6–20)
CO2: 23 mmol/L (ref 22–32)
Calcium: 8.6 mg/dL — ABNORMAL LOW (ref 8.9–10.3)
Chloride: 104 mmol/L (ref 98–111)
Creatinine, Ser: 0.85 mg/dL (ref 0.44–1.00)
GFR calc Af Amer: >60 mL/min (ref >60)
GFR calc non Af Amer: >60 mL/min (ref >60)
Glucose, Bld: 97 mg/dL (ref 70–99)
Potassium: 3.9 mmol/L (ref 3.5–5.1)
Sodium: 136 mmol/L (ref 135–145)
Total Bilirubin: 0.5 mg/dL (ref 0.3–1.2)
Total Protein: 5.8 g/dL — ABNORMAL LOW (ref 6.5–8.1)

## 2019-07-25 LAB — PROCALCITONIN: Procalcitonin: 13.44 ng/mL

## 2019-07-25 LAB — VITAMIN B12: Vitamin B-12: 1395 pg/mL — ABNORMAL HIGH (ref 180–914)

## 2019-07-25 LAB — CORTISOL-AM, BLOOD: Cortisol - AM: 22.5 ug/dL (ref 6.7–22.6)

## 2019-07-25 LAB — PROTIME-INR
INR: 1.2 (ref 0.8–1.2)
Prothrombin Time: 15.3 seconds — ABNORMAL HIGH (ref 11.4–15.2)

## 2019-07-25 LAB — IRON AND TIBC
Iron: 6 ug/dL — ABNORMAL LOW (ref 28–170)
Saturation Ratios: 2 % — ABNORMAL LOW (ref 10.4–31.8)
TIBC: 345 ug/dL (ref 250–450)
UIBC: 339 ug/dL

## 2019-07-25 LAB — RETICULOCYTES
Immature Retic Fract: 6.7 % (ref 2.3–15.9)
RBC.: 4.05 MIL/uL (ref 3.87–5.11)
Retic Count, Absolute: 29.2 10*3/uL (ref 19.0–186.0)
Retic Ct Pct: 0.7 % (ref 0.4–3.1)

## 2019-07-25 LAB — PREALBUMIN: Prealbumin: <5 mg/dL — ABNORMAL LOW (ref 18–38)

## 2019-07-25 LAB — FERRITIN: Ferritin: 37 ng/mL (ref 11–307)

## 2019-07-25 MED ORDER — MUPIROCIN 2 % EX OINT
1.0000 "application " | TOPICAL_OINTMENT | Freq: Two times a day (BID) | CUTANEOUS | Status: AC
  Administered 2019-07-25 – 2019-07-29 (×10): 1 via NASAL
  Filled 2019-07-25 (×2): qty 22

## 2019-07-25 MED ORDER — CHLORHEXIDINE GLUCONATE CLOTH 2 % EX PADS
6.0000 | MEDICATED_PAD | Freq: Every day | CUTANEOUS | Status: AC
  Administered 2019-07-25 – 2019-07-29 (×3): 6 via TOPICAL

## 2019-07-25 NOTE — Consult Note (Signed)
[facility] Consultation Note  Referring Provider: Dr. Blake DivineAkula [facility]) Primary Care Physician:  Patient, No Pcp Per  Reason for Consultation:  Abdominal pain  HPI: Colleen Ramirez is a 38 y.o. female whom we've been asked to see for abdominal pain.  She has history of gastric [place]  She was in static state of GI health until a couple days ago.  At that time, she had rather acute onset of fevers, nausea, vomiting, generalized abdominal pain, and diminished bowel function (no bowel movements or flatus).  She was admitted and CT scan showed small bowel enteritis with some prominent dilated loops of small bowel.  She started passing flatus and bowel movements today, and overall feels much better.  No further vomiting and no blood in stool.  Had endoscopy prior to gastric bypass years ago; denies prior colonoscopy.   Past Medical History:  Diagnosis Date  . Anemia   . Depression   . Drug abuse [facility](HCC)     Past Surgical History:  Procedure Laterality Date  . CESAREAN SECTION    . GASTRIC BYPASS      Prior to Admission medications   Medication Sig Start Date End Date Taking? Authorizing Provider  amphetamine-dextroamphetamine (ADDERALL) 20 MG tablet Take 20-40 mg by mouth 2 (two) times daily. Take 2 tablets (40mg ) in the morning and take 1 tablet (20mg ) in the afternoon    [provider]    Current Facility-Administered Medications  Medication Dose Route Frequency Provider Last Rate Last Dose  . acetaminophen (TYLENOL) tablet 650 mg  650 mg Oral Q6H PRN Regalado, Belkys A, MD   650 mg at 07/24/19 1328   Or  . acetaminophen (TYLENOL) suppository 650 mg  650 mg Rectal Q6H PRN Regalado, Belkys A, MD      . cefTRIAXone (ROCEPHIN) 2 g in sodium chloride 0.9 % 100 mL IVPB  2 g Intravenous Q24H Regalado, Belkys A, MD   Stopped at 07/25/19 1238  . Chlorhexidine Gluconate Cloth 2 % PADS 6 each  6 each Topical Q0600 Kathlen ModyAkula, Vijaya, MD   6 each at 07/25/19  1425  . enoxaparin (LOVENOX) injection 40 mg  40 mg Subcutaneous Q24H Regalado, Belkys A, MD   40 mg at 07/25/19 1207  . fentaNYL (SUBLIMAZE) injection 25 mcg  25 mcg Intravenous Q2H PRN Regalado, Belkys A, MD   25 mcg at 07/25/19 0008  . ketorolac (TORADOL) 15 MG/ML injection 15 mg  15 mg Intravenous Q6H PRN Regalado, Belkys A, MD   15 mg at 07/25/19 0953  . lactated ringers infusion   Intravenous Continuous Regalado, Belkys A, MD   Stopped at 07/25/19 1316  . metroNIDAZOLE (FLAGYL) IVPB 500 mg  500 mg Intravenous Q8H Regalado, Belkys A, MD   Stopped at 07/25/19 1425  . mupirocin ointment (BACTROBAN) 2 % 1 application  1 application Nasal BID Kathlen ModyAkula, Vijaya, MD   1 application at 07/25/19 0855  . ondansetron (ZOFRAN) tablet 4 mg  4 mg Oral Q6H PRN Regalado, Belkys A, MD       Or  . ondansetron (ZOFRAN) injection 4 mg  4 mg Intravenous Q6H PRN Regalado, Belkys A, MD   4 mg at 07/25/19 0854  . thiamine (B-1) injection 100 mg  100 mg Intravenous Daily Regalado, Belkys A, MD   100 mg at 07/25/19 0854  . traMADol (ULTRAM) tablet 50 mg  50 mg Oral Q6H PRN Regalado, Prentiss BellsBelkys A, MD  Allergies as of 07/24/2019  . (No Known Allergies)    No family history on file.  Social History   Socioeconomic History  . Marital status: Single    Spouse name: Not on file  . Number of children: Not on file  . Years of education: Not on file  . Highest education level: Not on file  Occupational History  . Not on file  Social Needs  . Financial resource strain: Not on file  . Food insecurity    Worry: Not on file    Inability: Not on file  . Transportation needs    Medical: Not on file    Non-medical: Not on file  Tobacco Use  . Smoking status: Current Every Day Smoker    Types: Cigarettes  . Smokeless tobacco: Never Used  Substance and Sexual Activity  . Alcohol use: Yes  . Drug use: Yes    Types: IV, Methamphetamines  . Sexual activity: Not on file  Lifestyle  . Physical activity    Days  per week: Not on file    Minutes per session: Not on file  . Stress: Not on file  Relationships  . Social Musician on phone: Not on file    Gets together: Not on file    Attends religious service: Not on file    Active member of club or organization: Not on file    Attends meetings of clubs or organizations: Not on file    Relationship status: Not on file  . Intimate partner violence    Fear of current or ex partner: Not on file    Emotionally abused: Not on file    Physically abused: Not on file    Forced sexual activity: Not on file  Other Topics Concern  . Not on file  Social History Narrative  . Not on file    Review of Systems: As per HPI, all others negative.  Physical Exam: Vital signs in last 24 hours: Temp:  [97.9 F (36.6 C)-98.9 F (37.2 C)] 98.6 F (37 C) (11/07 1150) Pulse Rate:  [41-107] 58 (11/07 1300) Resp:  [7-26] 17 (11/07 1300) BP: (84-166)/(52-98) 136/85 (11/07 1300) SpO2:  [97 %-100 %] 100 % (11/07 1300) Weight:  [56.5 kg] 56.5 kg (11/07 0400) Last BM Date: 07/22/19 General:   Alert,  Flat affect, thin, somewhat cachectic-appearing Head:  Normocephalic and atraumatic. Eyes:  Sclera clear, no icterus.   Conjunctiva pink. Ears:  Normal auditory acuity. Nose:  No deformity, discharge,  or lesions. Mouth:  No deformity or lesions.  Oropharynx pink & moist. Neck:  Supple; no masses or thyromegaly. Lungs:  Clear throughout to auscultation.   No wheezes, crackles, or rhonchi. No acute distress. Heart:  Regular rate and rhythm; no murmurs, clicks, rubs,  or gallops. Abdomen:  Soft, thin, mild distended and mild tympany, mild generalized abdominal tenderness without peritonitis; No masses, hepatosplenomegaly or hernias noted. Present but diminished bowel sounds Msk:  Atrophic but symmetrical without gross deformities. Normal posture. Pulses:  Normal pulses noted. Extremities:  Without clubbing or edema. Neurologic:  Alert and  oriented x4;   grossly normal neurologically. Cervical Nodes:  No significant cervical adenopathy. Psych:  Alert and cooperative. Flat affect   Lab Results: Recent Labs    07/24/19 0503 07/25/19 0242  WBC 17.7* 20.2*  HGB 11.1* 10.5*  HCT 35.6* 34.3*  PLT 366 277   BMET Recent Labs    07/24/19 0503 07/25/19 0242  NA 135 136  K 4.0 3.9  CL 100 104  CO2 25 23  GLUCOSE 126* 97  BUN 17 18  CREATININE 0.96 0.85  CALCIUM 9.1 8.6*   LFT Recent Labs    07/25/19 0242  PROT 5.8*  ALBUMIN 2.6*  AST 18  ALT 15  ALKPHOS 57  BILITOT 0.5   PT/INR Recent Labs    07/25/19 0242  LABPROT 15.3*  INR 1.2    Studies/Results: Dg Abd 1 View  Result Date: 07/25/2019 CLINICAL DATA:  Enteritis and small-bowel obstruction EXAM: ABDOMEN - 1 VIEW COMPARISON:  CT abdomen pelvis 07/24/2019 FINDINGS: Lung bases are clear. Monitoring leads overlie the patient. Normal heart size. Contrast material within the urinary bladder. Gas is demonstrated within nondilated loops of small bowel throughout the abdomen. Supine evaluation limited for free intraperitoneal air detection. IMPRESSION: Scattered nondilated loops of small bowel within the abdomen. Electronically Signed   By: Lovey Newcomer M.D.   On: 07/25/2019 09:29   Ct Abdomen Pelvis W Contrast  Result Date: 07/24/2019 CLINICAL DATA:  Two days of abdominal pain. IV drug abuser. Patient was uncooperative and combative as had to be scanned in the decubital position. EXAM: CT ABDOMEN AND PELVIS WITH CONTRAST TECHNIQUE: Multidetector CT imaging of the abdomen and pelvis was performed using the standard protocol following bolus administration of intravenous contrast. CONTRAST:  115mL OMNIPAQUE IOHEXOL 300 MG/ML  SOLN COMPARISON:  None. FINDINGS: Suboptimal positioning as described above in this combative uncooperative patient. Lower chest: Lung bases are normal. Hepatobiliary: 2 adjacent gallstones present with the larger measuring 6-7 mm. Liver and biliary tree  otherwise unremarkable. Pancreas: Unremarkable. Spleen: Normal. Adrenals/Urinary Tract: Adrenal glands are normal. Kidneys are normal in size without hydronephrosis or nephrolithiasis. Ureters and bladder are not well visualized. Stomach/Bowel: Multiple surgical sutures over the stomach small amount of contrast over the jejunum. Suggestion of gastrojejunostomy which is patent. Findings suggesting mild thickened irregular folds and wall thickening involving several more distal jejunal loops over the mid to lower abdomen. Is very difficult to evaluate remainder of the small bowel and colon due to lack of contrast and sparse peritoneal fat. Mild dilatation of a single small bowel loop over the midline upper abdomen measuring 3.1 cm in diameter. There is a surgical suture line over a stool containing bowel loop in the anterior midline abdomen just right of midline as this may represent a small bowel loop versus loop of colon. Most of the colon is air and stool-filled without dilatation. Rectosigmoid colon is difficult to identify. Appendix not visualized. No evidence of free peritoneal air. Vascular/Lymphatic: Minimal calcified plaque over the abdominal aorta. No adenopathy. Reproductive: Uterus is normal.  Ovaries are not well visualized. Other: No significant free fluid. Musculoskeletal: No acute findings. IMPRESSION: 1. Previous gastric bypass with suggestion of gastrojejunostomy which is patent. Suture line over a bowel loop in the right anterior mid abdomen. Several small bowel loops with irregular fold thickening/wall thickening with single dilated small bowel loop measuring 3.1 cm. Findings are likely due to a regional enteritis of infectious or inflammatory nature. There may be a degree of small-bowel obstruction versus ileus. Bowel is very difficult to evaluate due to only a small amount of contrast in the proximal small bowel and sparse peritoneal fat. 2.  Cholelithiasis. 3.  Aortic Atherosclerosis  (ICD10-I70.0). Electronically Signed   By: Marin Olp M.D.   On: 07/24/2019 07:55    Impression:  1.  Abdominal pain, nausea, vomiting.  Much improved since she began passing flatus and bowel  movements today. 2.  Gastric bypass with 100+ lb weight loss.  Patient reports she thinks she might have lost too much weight. 3.  Abnormal CT scan, thickened jejunal loops in mid lower abdomen.  Suspect some sort of infectious enteritis, given no significant prior similar episodes and rather acute onset of symptoms. Do not suspect inflammatory bowel disease.    Plan:  1.  Supportive care, IV fluids, analgesics, advance diet, per surgical and primary team. 2.  Patient is on antibiotics, empirically, infectious etiology not certain (and not certain whether bacterial or viral). 3.  The mildly thickened small bowel loops are fairly distal jejunal loops; and, especially in light of patient's gastric bypass, do not appear to me to be endoscopically accessible; moreover, if she continues to improve with expectant management (which does not include therapy for IBD), I don't think endoscopic evaluation is necessary regardless. 4.  If patient has set back (recurrent obstructive type symptoms) or accelerating pain +/- nausea/vomiting, might consider CT- or MR-enterography abdomen/pelvis, but at the present time I don't think this is necessary. 5.  [facility] will sign-off; outpatient GI follow-up prn basis only; please call with questions; thank you for the consultation.   LOS: 1 day   Henley Boettner M  07/25/2019, 2:47 PM  Cell [phone] If no answer or after 5 PM call [phone]

## 2019-07-25 NOTE — Progress Notes (Addendum)
Assessment & Plan:  Abdominal pain - enteritis vs sbo - CT shows small bowel wall thickening with single dilated loop, suggestive of infectious vs inflammatory process - AXR this AM with moderate stool & gas in colon, no obvious obstruction - final reading pending - WBC 20K - IV Zosyn empirically x 1 dose, now Flagyl - per medical service - allow clear liquid diet today  Hx of gastric bypass - in [place], approx 7 years ago IV Drug abuse, hx of tobacco, ETOH, cocaine Malnutrition - prealbumin pending  FEN: IVF, CLD VTE: SCDs, ok for chemical prophylaxis from a surgical standpoint ID: Rocephin/Flagyl 11/6  >> day 1  Abdomen is soft this AM with mild diffuse tenderness.  AXR without evidence of obstruction.  Trial of clear liquids today.  Will follow with you.        Darnell Level, MD       [facility]       Office: [phone]   Chief Complaint: Abdominal pain, rule out SBO vs enteritis  Subjective: Patient in bed, sleeping.  Arouses to voice.  Denies abd pain.  No flatus or BM per patient.  Objective: Vital signs in last 24 hours: Temp:  [97.9 F (36.6 C)-101.6 F (38.7 C)] 98.5 F (36.9 C) (11/07 0400) Pulse Rate:  [45-120] 45 (11/07 0700) Resp:  [12-26] 13 (11/07 0700) BP: (84-153)/(52-98) 153/98 (11/07 0700) SpO2:  [97 %-100 %] 100 % (11/07 0700) Weight:  [56.5 kg] 56.5 kg (11/07 0400) Last BM Date: 07/22/19  Intake/Output from previous day: 11/06 0701 - 11/07 0700 In: 6585.7 [I.V.:2944.8; IV Piggyback:3641] Out: -  Intake/Output this shift: No intake/output data recorded.  Physical Exam: HEENT - sclerae clear, mucous membranes moist Neck - soft Abdomen - soft without distension; diffuse mild tenderness, tearful; no mass Ext - no edema, non-tender Neuro - alert & oriented, no focal deficits  Lab Results:  Recent Labs    07/24/19 0503 07/25/19 0242  WBC 17.7* 20.2*  HGB 11.1* 10.5*  HCT 35.6* 34.3*  PLT 366 277   BMET  Recent Labs    07/24/19 0503 07/25/19 0242  NA 135 136  K 4.0 3.9  CL 100 104  CO2 25 23  GLUCOSE 126* 97  BUN 17 18  CREATININE 0.96 0.85  CALCIUM 9.1 8.6*   PT/INR Recent Labs    07/25/19 0242  LABPROT 15.3*  INR 1.2   Comprehensive Metabolic Panel:    Component Value Date/Time   NA 136 07/25/2019 0242   NA 135 07/24/2019 0503   K 3.9 07/25/2019 0242   K 4.0 07/24/2019 0503   CL 104 07/25/2019 0242   CL 100 07/24/2019 0503   CO2 23 07/25/2019 0242   CO2 25 07/24/2019 0503   BUN 18 07/25/2019 0242   BUN 17 07/24/2019 0503   CREATININE 0.85 07/25/2019 0242   CREATININE 0.96 07/24/2019 0503   GLUCOSE 97 07/25/2019 0242   GLUCOSE 126 (H) 07/24/2019 0503   CALCIUM 8.6 (L) 07/25/2019 0242   CALCIUM 9.1 07/24/2019 0503   AST 18 07/25/2019 0242   AST 19 07/24/2019 0503   ALT 15 07/25/2019 0242   ALT 17 07/24/2019 0503   ALKPHOS 57 07/25/2019 0242   ALKPHOS 60 07/24/2019 0503   BILITOT 0.5 07/25/2019 0242   BILITOT 0.7 07/24/2019 0503   PROT 5.8 (L) 07/25/2019 0242   PROT 7.2 07/24/2019 0503   ALBUMIN 2.6 (L) 07/25/2019 0242   ALBUMIN 3.5 07/24/2019 0503  Studies/Results: Ct Abdomen Pelvis W Contrast  Result Date: 07/24/2019 CLINICAL DATA:  Two days of abdominal pain. IV drug abuser. Patient was uncooperative and combative as had to be scanned in the decubital position. EXAM: CT ABDOMEN AND PELVIS WITH CONTRAST TECHNIQUE: Multidetector CT imaging of the abdomen and pelvis was performed using the standard protocol following bolus administration of intravenous contrast. CONTRAST:  191mL OMNIPAQUE IOHEXOL 300 MG/ML  SOLN COMPARISON:  None. FINDINGS: Suboptimal positioning as described above in this combative uncooperative patient. Lower chest: Lung bases are normal. Hepatobiliary: 2 adjacent gallstones present with the larger measuring 6-7 mm. Liver and biliary tree otherwise unremarkable. Pancreas: Unremarkable. Spleen: Normal. Adrenals/Urinary Tract: Adrenal glands  are normal. Kidneys are normal in size without hydronephrosis or nephrolithiasis. Ureters and bladder are not well visualized. Stomach/Bowel: Multiple surgical sutures over the stomach small amount of contrast over the jejunum. Suggestion of gastrojejunostomy which is patent. Findings suggesting mild thickened irregular folds and wall thickening involving several more distal jejunal loops over the mid to lower abdomen. Is very difficult to evaluate remainder of the small bowel and colon due to lack of contrast and sparse peritoneal fat. Mild dilatation of a single small bowel loop over the midline upper abdomen measuring 3.1 cm in diameter. There is a surgical suture line over a stool containing bowel loop in the anterior midline abdomen just right of midline as this may represent a small bowel loop versus loop of colon. Most of the colon is air and stool-filled without dilatation. Rectosigmoid colon is difficult to identify. Appendix not visualized. No evidence of free peritoneal air. Vascular/Lymphatic: Minimal calcified plaque over the abdominal aorta. No adenopathy. Reproductive: Uterus is normal.  Ovaries are not well visualized. Other: No significant free fluid. Musculoskeletal: No acute findings. IMPRESSION: 1. Previous gastric bypass with suggestion of gastrojejunostomy which is patent. Suture line over a bowel loop in the right anterior mid abdomen. Several small bowel loops with irregular fold thickening/wall thickening with single dilated small bowel loop measuring 3.1 cm. Findings are likely due to a regional enteritis of infectious or inflammatory nature. There may be a degree of small-bowel obstruction versus ileus. Bowel is very difficult to evaluate due to only a small amount of contrast in the proximal small bowel and sparse peritoneal fat. 2.  Cholelithiasis. 3.  Aortic Atherosclerosis (ICD10-I70.0). Electronically Signed   By: Marin Olp M.D.   On: 07/24/2019 07:55      Colleen Ramirez  07/25/2019  Patient ID: Colleen Ramirez, female   DOB: [birth year], 38 y.o.   MRN: [id number]

## 2019-07-25 NOTE — Progress Notes (Signed)
PROGRESS NOTE    Colleen Ramirez  WUJ:[id number] DOB: December 05, 1980 DOA: 07/24/2019 PCP: Patient, No Pcp Per    Brief Narrative:   38 year old lady with prior history of gastric bypass 7 years ago in [place], iron deficiency anemia history of chronic drug use, depression presents to ED for nausea vomiting diarrhea and abdominal pain.  CT of the abdomen pelvis shows evidence of regional enteritis of infectious or inflammatory in nature with a degree of small bowel obstruction versus ileus.  Surgery and gastroenterology consulted and recommendations given.   Assessment & Plan:   Active Problems:   Sepsis (HCC)   Anemia   Depression   Drug abuse (HCC)   Enteritis   Sepsis possibly secondary to regional enteritis along with a component of small bowel obstruction. On admission patient was febrile tachycardic slightly hypotensive with lactic acidosis and elevated WBC count. Sepsis physiology improving with IV antibiotics and fluids.  Lactic acid within normal limits this morning procalcitonin still elevated at 13.44.  Patient febrile this morning and WBC count of 20,000's. Continue with Rocephin and Flagyl.  Dr Gerrit Friends on board recommended to continue with IV antibiotics and allow clear liquid diet. GI consulted and recommended to continue the treatment for now.    Iron deficiency anemia along with folate deficiency. Iron and folate supplementation will be added when she is able to tolerate oral intake.    History of drug abuse UDS will be ordered.  DVT prophylaxis: lovenox.  Code Status: FULL CODE Family Communication: none at bedside.  Disposition Plan:pending clinical improvement.   Consultants:   SURGERY.   Procedures: CT ABD and pelvis shows Previous gastric bypass with suggestion of gastrojejunostomy which is patent. Suture line over a bowel loop in the right anterior mid abdomen. Several small bowel loops with irregular fold thickening/wall thickening with single dilated  small bowel loop measuring 3.1 cm. Findings are likely due to a regional enteritis of infectious or inflammatory nature. There may be a degree of small-bowel obstruction versus ileus. Bowel is very difficult to evaluate due to only a small amount of contrast in the proximal small bowel and sparse peritoneal fat.  Antimicrobials: IV  Rocephin and flagyl.   Subjective:   Objective: Vitals:   07/25/19 0400 07/25/19 0609 07/25/19 0700 07/25/19 0747  BP:  133/82 (!) 153/98   Pulse:  62 (!) 45   Resp:  12 13   Temp: 98.5 F (36.9 C)   98.6 F (37 C)  TempSrc: Oral   Oral  SpO2:  100% 100%   Weight: 56.5 kg     Height:        Intake/Output Summary (Last 24 hours) at 07/25/2019 0917 Last data filed at 07/25/2019 0700 Gross per 24 hour  Intake 4535.73 ml  Output -  Net 4535.73 ml   Filed Weights   07/24/19 0444 07/25/19 0400  Weight: 54.4 kg 56.5 kg    Examination:  General exam: Appears calm and comfortable  Respiratory system: Clear to auscultation. Respiratory effort normal. Cardiovascular system: S1 & S2 heard, RRR. No JVD, . No pedal edema. Gastrointestinal system: Abdomen is nondistended, soft and nontender. No organomegaly or masses felt. Normal bowel sounds heard. Central nervous system: Alert and oriented. No focal neurological deficits. Extremities: Symmetric 5 x 5 power. Skin: No rashes, lesions or ulcers Psychiatry:  Mood & affect appropriate.     Data Reviewed: I have personally reviewed following labs and imaging studies  CBC: Recent Labs  Lab 07/24/19 0503 07/25/19 0242  WBC 17.7* 20.2*  NEUTROABS 16.3*  --   HGB 11.1* 10.5*  HCT 35.6* 34.3*  MCV 83.4 84.7  PLT 366 277   Basic Metabolic Panel: Recent Labs  Lab 07/24/19 0503 07/25/19 0242  NA 135 136  K 4.0 3.9  CL 100 104  CO2 25 23  GLUCOSE 126* 97  BUN 17 18  CREATININE 0.96 0.85  CALCIUM 9.1 8.6*   GFR: Estimated Creatinine Clearance: 75.2 mL/min (by C-G formula based on SCr of 0.85  mg/dL). Liver Function Tests: Recent Labs  Lab 07/24/19 0503 07/25/19 0242  AST 19 18  ALT 17 15  ALKPHOS 60 57  BILITOT 0.7 0.5  PROT 7.2 5.8*  ALBUMIN 3.5 2.6*   Recent Labs  Lab 07/24/19 0503  LIPASE 19   No results for input(s): AMMONIA in the last 168 hours. Coagulation Profile: Recent Labs  Lab 07/25/19 0242  INR 1.2   Cardiac Enzymes: No results for input(s): CKTOTAL, CKMB, CKMBINDEX, TROPONINI in the last 168 hours. BNP (last 3 results) No results for input(s): PROBNP in the last 8760 hours. HbA1C: No results for input(s): HGBA1C in the last 72 hours. CBG: No results for input(s): GLUCAP in the last 168 hours. Lipid Profile: No results for input(s): CHOL, HDL, LDLCALC, TRIG, CHOLHDL, LDLDIRECT in the last 72 hours. Thyroid Function Tests: No results for input(s): TSH, T4TOTAL, FREET4, T3FREE, THYROIDAB in the last 72 hours. Anemia Panel: Recent Labs    07/25/19 0242  VITAMINB12 1,395*  FOLATE 5.6*  FERRITIN 37  TIBC 345  IRON 6*  RETICCTPCT 0.7   Sepsis Labs: Recent Labs  Lab 07/24/19 [id number] 07/24/19 1155 07/24/19 1415 07/25/19 0242  PROCALCITON  --   --   --  13.44  LATICACIDVEN 3.2* 1.9 1.4  --     Recent Results (from the past 240 hour(s))  Blood culture (routine x 2)     Status: None (Preliminary result)   Collection Time: 07/24/19  6:45 AM   Specimen: BLOOD  Result Value Ref Range Status   Specimen Description   Final    BLOOD RIGHT ANTECUBITAL Performed at [facility] [place], [address] [place], [place] [address]    Special Requests   Final    BOTTLES DRAWN AEROBIC AND ANAEROBIC Blood Culture adequate volume Performed at [facility] [place], [address] [place] [address] [place], [place] [address]    Culture   Final    NO GROWTH < 24 HOURS Performed at [facility], [address] [place], [place] [address]    Report Status PENDING  Incomplete  Blood culture (routine x 2)     Status: None (Preliminary result)    Collection Time: 07/24/19  7:25 AM   Specimen: BLOOD RIGHT HAND  Result Value Ref Range Status   Specimen Description   Final    BLOOD RIGHT HAND Performed at [facility] [place], [address] [place], [place] [address]    Special Requests   Final    BOTTLES DRAWN AEROBIC ONLY Blood Culture results may not be optimal due to an inadequate volume of blood received in culture bottles   Culture   Final    NO GROWTH < 24 HOURS Performed at [facility], [address] [place], [place] [address]    Report Status PENDING  Incomplete  SARS CORONAVIRUS 2 (TAT 6-24 HRS) Nasopharyngeal Nasopharyngeal Swab     Status: None   Collection Time: 07/24/19  7:33 AM  Specimen: Nasopharyngeal Swab  Result Value Ref Range Status   SARS Coronavirus 2 NEGATIVE NEGATIVE Final    Comment: (NOTE) SARS-CoV-2 target nucleic acids are NOT DETECTED. The SARS-CoV-2 RNA is generally detectable in upper and lower respiratory specimens during the acute phase of infection. Negative results do not preclude SARS-CoV-2 infection, do not rule out co-infections with other pathogens, and should not be used as the sole basis for treatment or other patient management decisions. Negative results must be combined with clinical observations, patient history, and epidemiological information. The expected result is Negative. Fact Sheet for Patients: HairSlick.no Fact Sheet for Healthcare Providers: quierodirigir.com This test is not yet approved or cleared by the [place] FDA and  has been authorized for detection and/or diagnosis of SARS-CoV-2 by FDA under an Emergency Use Authorization (EUA). This EUA will remain  in effect (meaning this test can be used) for the duration of the COVID-19 declaration under Section 56 4(b)(1) of the Act, 21 U.S.C. section 360bbb-3(b)(1), unless the authorization is terminated or revoked sooner. Performed at [facility], [address] [place], [place] [address]   MRSA PCR Screening     Status: Abnormal   Collection Time: 07/24/19 11:17 AM   Specimen: Nasal Mucosa; Nasopharyngeal  Result Value Ref Range Status   MRSA by PCR POSITIVE (A) NEGATIVE Final    Comment:        The GeneXpert MRSA Assay (FDA approved for NASAL specimens only), is one component of a comprehensive MRSA colonization surveillance program. It is not intended to diagnose MRSA infection nor to guide or monitor treatment for MRSA infections. RESULT CALLED TO, READ BACK BY AND VERIFIED WITH: [place] [id number] @ 1353 BY J SCOTTON Performed at [facility], [address] [place], [place] [address]          Radiology Studies: Ct Abdomen Pelvis W Contrast  Result Date: 07/24/2019 CLINICAL DATA:  Two days of abdominal pain. IV drug abuser. Patient was uncooperative and combative as had to be scanned in the decubital position. EXAM: CT ABDOMEN AND PELVIS WITH CONTRAST TECHNIQUE: Multidetector CT imaging of the abdomen and pelvis was performed using the standard protocol following bolus administration of intravenous contrast. CONTRAST:  OMNIPAQUE IOHEXOL 300 MG/ML  SOLN COMPARISON:  None. FINDINGS: Suboptimal positioning as described above in this combative uncooperative patient. Lower chest: Lung bases are normal. Hepatobiliary: 2 adjacent gallstones present with the larger measuring 6-7 mm. Liver and biliary tree otherwise unremarkable. Pancreas: Unremarkable. Spleen: Normal. Adrenals/Urinary Tract: Adrenal glands are normal. Kidneys are normal in size without hydronephrosis or nephrolithiasis. Ureters and bladder are not well visualized. Stomach/Bowel: Multiple surgical sutures over the stomach small amount of contrast over the jejunum. Suggestion of gastrojejunostomy which is patent. Findings suggesting mild thickened irregular folds and wall thickening involving several more distal jejunal loops  over the mid to lower abdomen. Is very difficult to evaluate remainder of the small bowel and colon due to lack of contrast and sparse peritoneal fat. Mild dilatation of a single small bowel loop over the midline upper abdomen measuring 3.1 cm in diameter. There is a surgical suture line over a stool containing bowel loop in the anterior midline abdomen just right of midline as this may represent a small bowel loop versus loop of colon. Most of the colon is air and stool-filled without dilatation. Rectosigmoid colon is difficult to identify. Appendix not visualized. No evidence of free peritoneal air. Vascular/Lymphatic: Minimal calcified plaque over the abdominal  aorta. No adenopathy. Reproductive: Uterus is normal.  Ovaries are not well visualized. Other: No significant free fluid. Musculoskeletal: No acute findings. IMPRESSION: 1. Previous gastric bypass with suggestion of gastrojejunostomy which is patent. Suture line over a bowel loop in the right anterior mid abdomen. Several small bowel loops with irregular fold thickening/wall thickening with single dilated small bowel loop measuring 3.1 cm. Findings are likely due to a regional enteritis of infectious or inflammatory nature. There may be a degree of small-bowel obstruction versus ileus. Bowel is very difficult to evaluate due to only a small amount of contrast in the proximal small bowel and sparse peritoneal fat. 2.  Cholelithiasis. 3.  Aortic Atherosclerosis (ICD10-I70.0). Electronically Signed   By: Marin Olp M.D.   On: 07/24/2019 07:55        Scheduled Meds: . Chlorhexidine Gluconate Cloth  6 each Topical Q0600  . enoxaparin (LOVENOX) injection  40 mg Subcutaneous Q24H  . mupirocin ointment  1 application Nasal BID  . thiamine injection  100 mg Intravenous Daily   Continuous Infusions: . cefTRIAXone (ROCEPHIN)  IV Stopped (07/24/19 1357)  . lactated ringers 125 mL/hr at 07/25/19 0700  . metronidazole Stopped (07/25/19 0447)      LOS: 1 day        Hosie Poisson, MD [facility] Pager [phone]  If 7PM-7AM, please contact night-coverage [email or web address] Password [facility] 07/25/2019, 9:17 AM

## 2019-07-26 ENCOUNTER — Inpatient Hospital Stay (HOSPITAL_COMMUNITY): Payer: Self-pay

## 2019-07-26 LAB — CBC WITH DIFFERENTIAL/PLATELET
Abs Immature Granulocytes: 0.08 10*3/uL — ABNORMAL HIGH (ref 0.00–0.07)
Basophils Absolute: 0 10*3/uL (ref 0.0–0.1)
Basophils Relative: 0 %
Eosinophils Absolute: 0.4 10*3/uL (ref 0.0–0.5)
Eosinophils Relative: 2 %
HCT: 30.2 % — ABNORMAL LOW (ref 36.0–46.0)
Hemoglobin: 9.2 g/dL — ABNORMAL LOW (ref 12.0–15.0)
Immature Granulocytes: 1 %
Lymphocytes Relative: 12 %
Lymphs Abs: 1.8 10*3/uL (ref 0.7–4.0)
MCH: 25.7 pg — ABNORMAL LOW (ref 26.0–34.0)
MCHC: 30.5 g/dL (ref 30.0–36.0)
MCV: 84.4 fL (ref 80.0–100.0)
Monocytes Absolute: 0.9 10*3/uL (ref 0.1–1.0)
Monocytes Relative: 6 %
Neutro Abs: 12.4 10*3/uL — ABNORMAL HIGH (ref 1.7–7.7)
Neutrophils Relative %: 79 %
Platelets: 303 10*3/uL (ref 150–400)
RBC: 3.58 MIL/uL — ABNORMAL LOW (ref 3.87–5.11)
RDW: 14.9 % (ref 11.5–15.5)
WBC: 15.6 10*3/uL — ABNORMAL HIGH (ref 4.0–10.5)
nRBC: 0 % (ref 0.0–0.2)

## 2019-07-26 LAB — BASIC METABOLIC PANEL
Anion gap: 8 (ref 5–15)
BUN: 22 mg/dL — ABNORMAL HIGH (ref 6–20)
CO2: 22 mmol/L (ref 22–32)
Calcium: 8.3 mg/dL — ABNORMAL LOW (ref 8.9–10.3)
Chloride: 107 mmol/L (ref 98–111)
Creatinine, Ser: 0.88 mg/dL (ref 0.44–1.00)
GFR calc Af Amer: >60 mL/min (ref >60)
GFR calc non Af Amer: >60 mL/min (ref >60)
Glucose, Bld: 103 mg/dL — ABNORMAL HIGH (ref 70–99)
Potassium: 3.7 mmol/L (ref 3.5–5.1)
Sodium: 137 mmol/L (ref 135–145)

## 2019-07-26 MED ORDER — FERROUS SULFATE 325 (65 FE) MG PO TABS
325.0000 mg | ORAL_TABLET | Freq: Every day | ORAL | Status: DC
  Administered 2019-07-27 – 2019-07-31 (×5): 325 mg via ORAL
  Filled 2019-07-26 (×5): qty 1

## 2019-07-26 MED ORDER — SODIUM CHLORIDE 0.9 % IV SOLN
INTRAVENOUS | Status: DC
  Administered 2019-07-26: 11:00:00 via INTRAVENOUS
  Administered 2019-07-27: 12:00:00 1000 mL via INTRAVENOUS
  Administered 2019-07-28 (×2): via INTRAVENOUS

## 2019-07-26 MED ORDER — DIAZEPAM 5 MG PO TABS
5.0000 mg | ORAL_TABLET | Freq: Two times a day (BID) | ORAL | Status: DC | PRN
  Administered 2019-07-26 – 2019-07-30 (×7): 5 mg via ORAL
  Filled 2019-07-26 (×7): qty 1

## 2019-07-26 MED ORDER — AMPHETAMINE-DEXTROAMPHETAMINE 20 MG PO TABS
40.0000 mg | ORAL_TABLET | ORAL | Status: DC
  Administered 2019-07-26 – 2019-07-31 (×6): 40 mg via ORAL
  Filled 2019-07-26 (×6): qty 2

## 2019-07-26 MED ORDER — TRAZODONE HCL 50 MG PO TABS
50.0000 mg | ORAL_TABLET | Freq: Every evening | ORAL | Status: DC | PRN
  Administered 2019-07-28 – 2019-07-30 (×4): 50 mg via ORAL
  Filled 2019-07-26 (×5): qty 1

## 2019-07-26 MED ORDER — AMPHETAMINE-DEXTROAMPHETAMINE 20 MG PO TABS: 20.0000 mg | ORAL_TABLET | Freq: Two times a day (BID) | ORAL | Status: DC

## 2019-07-26 MED ORDER — ALUM & MAG HYDROXIDE-SIMETH 200-200-20 MG/5ML PO SUSP
30.0000 mL | Freq: Once | ORAL | Status: AC
  Administered 2019-07-26: 22:00:00 30 mL via ORAL
  Filled 2019-07-26: qty 30

## 2019-07-26 MED ORDER — FOLIC ACID 1 MG PO TABS
1.0000 mg | ORAL_TABLET | Freq: Every day | ORAL | Status: DC
  Administered 2019-07-26 – 2019-07-31 (×6): 1 mg via ORAL
  Filled 2019-07-26 (×6): qty 1

## 2019-07-26 MED ORDER — AMPHETAMINE-DEXTROAMPHETAMINE 20 MG PO TABS
20.0000 mg | ORAL_TABLET | ORAL | Status: DC
  Administered 2019-07-26 – 2019-07-30 (×5): 20 mg via ORAL
  Filled 2019-07-26 (×5): qty 1

## 2019-07-26 NOTE — Progress Notes (Signed)
Assessment & Plan: Abdominal pain - enteritis vs sbo - CT shows small bowel wall thickening with single dilated loop, suggestive of infectious vs inflammatory process - WBC improved to 15K this AM - IV Zosyn empirically x 1 dose, now Flagyl - per medical service - tolerating clear liquid diet - OK to advance diet from surgical standpoint  Hx of gastric bypass - in [place], approx 7 years ago IV Drug abuse, hx of tobacco, ETOH, cocaine Malnutrition- prealbumin pending  FEN: IVF, CLD VTE: SCDs, ok for chemical prophylaxis from a surgical standpoint PI:[id number]/[id number] 11/6 >>  Abdomen is soft this AM without tenderness.  Having BM's and passing flatus.  OK to advance diet.        Darnell Level, MD       [facility]       Office: [phone]   Chief Complaint: Abdominal pain, rule out small bowel obstruction  Subjective: Patient sitting up in bed this morning, more alert and responsive.  Having BM's and passing flatus.  Wants to eat regular food.  Tolerating clear liquid diet.  Objective: Vital signs in last 24 hours: Temp:  [97.9 F (36.6 C)-99.2 F (37.3 C)] 98.3 F (36.8 C) (11/08 0800) Pulse Rate:  [42-79] 79 (11/08 0900) Resp:  [0-19] 18 (11/08 0900) BP: (117-166)/(65-103) 117/83 (11/08 0900) SpO2:  [100 %] 100 % (11/08 0900) Last BM Date: 07/25/19  Intake/Output from previous day: 11/07 0701 - 11/08 0700 In: 3282.7 [P.O.:600; I.V.:2282.7; IV Piggyback:400] Out: 2 [Urine:1; Stool:1] Intake/Output this shift: No intake/output data recorded.  Physical Exam: HEENT - sclerae clear, mucous membranes moist Neck - soft Abdomen - soft without distension; BS present; non-tender Ext - no edema, non-tender Neuro - alert & oriented, no focal deficits  Lab Results:  Recent Labs    07/25/19 0242 07/26/19 0256  WBC 20.2* 15.6*  HGB 10.5* 9.2*  HCT 34.3* 30.2*  PLT 277 303   BMET Recent Labs    07/25/19 0242 07/26/19 0256  NA 136 137   K 3.9 3.7  CL 104 107  CO2 23 22  GLUCOSE 97 103*  BUN 18 22*  CREATININE 0.85 0.88  CALCIUM 8.6* 8.3*   PT/INR Recent Labs    07/25/19 0242  LABPROT 15.3*  INR 1.2   Comprehensive Metabolic Panel:    Component Value Date/Time   NA 137 07/26/2019 0256   NA 136 07/25/2019 0242   K 3.7 07/26/2019 0256   K 3.9 07/25/2019 0242   CL 107 07/26/2019 0256   CL 104 07/25/2019 0242   CO2 22 07/26/2019 0256   CO2 23 07/25/2019 0242   BUN 22 (H) 07/26/2019 0256   BUN 18 07/25/2019 0242   CREATININE 0.88 07/26/2019 0256   CREATININE 0.85 07/25/2019 0242   GLUCOSE 103 (H) 07/26/2019 0256   GLUCOSE 97 07/25/2019 0242   CALCIUM 8.3 (L) 07/26/2019 0256   CALCIUM 8.6 (L) 07/25/2019 0242   AST 18 07/25/2019 0242   AST 19 07/24/2019 0503   ALT 15 07/25/2019 0242   ALT 17 07/24/2019 0503   ALKPHOS 57 07/25/2019 0242   ALKPHOS 60 07/24/2019 0503   BILITOT 0.5 07/25/2019 0242   BILITOT 0.7 07/24/2019 0503   PROT 5.8 (L) 07/25/2019 0242   PROT 7.2 07/24/2019 0503   ALBUMIN 2.6 (L) 07/25/2019 0242   ALBUMIN 3.5 07/24/2019 0503    Studies/Results: Dg Abd 1 View  Result Date: 07/25/2019 CLINICAL DATA:  Enteritis and small-bowel obstruction EXAM: ABDOMEN -  1 VIEW COMPARISON:  CT abdomen pelvis 07/24/2019 FINDINGS: Lung bases are clear. Monitoring leads overlie the patient. Normal heart size. Contrast material within the urinary bladder. Gas is demonstrated within nondilated loops of small bowel throughout the abdomen. Supine evaluation limited for free intraperitoneal air detection. IMPRESSION: Scattered nondilated loops of small bowel within the abdomen. Electronically Signed   By: Lovey Newcomer M.D.   On: 07/25/2019 09:29      [name] 07/26/2019  Patient ID: Colleen Ramirez, female   DOB: May 13, 1981, 38 y.o.   MRN: [id number]

## 2019-07-26 NOTE — Progress Notes (Signed)
PROGRESS NOTE    Colleen Ramirez  HUD:[id number] DOB: 1980/12/28 DOA: 07/24/2019 PCP: Patient, No Pcp Per    Brief Narrative:   38 year old lady with prior history of gastric bypass 7 years ago in [place], iron deficiency anemia history of chronic drug use, depression presents to ED for nausea vomiting diarrhea and abdominal pain.  CT of the abdomen pelvis shows evidence of regional enteritis of infectious or inflammatory in nature with a degree of small bowel obstruction versus ileus.  Surgery and gastroenterology consulted and recommendations given. Surgery recommends to advance diet as tolerated and continue with IV antibiotics.  Assessment & Plan:   Active Problems:   Sepsis ([place])   Anemia   Depression   Drug abuse ([place])   Enteritis   Sepsis possibly secondary to regional enteritis along with a component of small bowel obstruction. On admission patient was febrile, tachycardic, slightly hypotensive with lactic acidosis and elevated WBC count. Sepsis physiology improving with IV antibiotics and fluids.  Lactic acid within normal limits this morning procalcitonin still elevated at 13.44.  Patient is afebrile this morning and WBC count has improved to 15,000. Continue with Rocephin and Flagyl.  Dr Harlow Asa on board recommended to continue with IV antibiotics and advance diet as tolerated GI consulted and recommended to continue the treatment for now.  No further interventions at this time    Iron deficiency anemia along with folate deficiency. Iron and folate supplementation will be added.    History of drug abuse UDS not done.   Patient requesting Valium and sleep medication.  DVT prophylaxis: lovenox.  Code Status: FULL CODE Family Communication: none at bedside.  Disposition Plan:pending clinical improvement.  Transfer the patient to [facility].  Consultants:   SURGERY.  Gastroenterology Dr. Paulita Fujita  Procedures: CT ABD and pelvis shows Previous gastric bypass with  suggestion of gastrojejunostomy which is patent. Suture line over a bowel loop in the right anterior mid abdomen. Several small bowel loops with irregular fold thickening/wall thickening with single dilated small bowel loop measuring 3.1 cm. Findings are likely due to a regional enteritis of infectious or inflammatory nature. There may be a degree of small-bowel obstruction versus ileus. Bowel is very difficult to evaluate due to only a small amount of contrast in the proximal small bowel and sparse peritoneal fat.  Antimicrobials: IV  Rocephin and flagyl.   Subjective: Patient denies any chest pain or shortness of breath.  Her nausea has improved but not completely resolved.  She does not have any vomiting.  She continues to have loose bowel movements and abdominal pain has improved but not completely resolved.  Objective: Vitals:   07/26/19 0600 07/26/19 0700 07/26/19 0800 07/26/19 0900  BP: (!) 131/91 132/85 (!) 143/100 117/83  Pulse: (!) 43 (!) 42 (!) 48 79  Resp: (!) 0 (!) 9 15 18   Temp:   98.3 F (36.8 C)   TempSrc:   Oral   SpO2: 100% 100% 100% 100%  Weight:      Height:        Intake/Output Summary (Last 24 hours) at 07/26/2019 1142 Last data filed at 07/26/2019 0700 Gross per 24 hour  Intake 3282.66 ml  Output 2 ml  Net 3280.66 ml   Filed Weights   07/24/19 0444 07/25/19 0400  Weight: 54.4 kg 56.5 kg    Examination:  General exam: Ill-appearing but not in any kind of distress. Respiratory system: Air entry fair, no wheezing or rhonchi Cardiovascular system: S1-S2 heard, regular rate rhythm, no  JVD Gastrointestinal system: Abdomen is soft, tender in the lower quadrant and around the umbilicus.  No distention, no signs of peritonitis.,  Bowel sounds are normal Central nervous system: Alert and oriented, patient responding to questions appropriately Extremities: No pedal edema Skin: No rashes Psychiatry: Patient anxious and irritable    Data Reviewed: I have  personally reviewed following labs and imaging studies  CBC: Recent Labs  Lab 07/24/19 0503 07/25/19 0242 07/26/19 0256  WBC 17.7* 20.2* 15.6*  NEUTROABS 16.3*  --  12.4*  HGB 11.1* 10.5* 9.2*  HCT 35.6* 34.3* 30.2*  MCV 83.4 84.7 84.4  PLT 366 277 303   Basic Metabolic Panel: Recent Labs  Lab 07/24/19 0503 07/25/19 0242 07/26/19 0256  NA 135 136 137  K 4.0 3.9 3.7  CL 100 104 107  CO2 25 23 22   GLUCOSE 126* 97 103*  BUN 17 18 22*  CREATININE 0.96 0.85 0.88  CALCIUM 9.1 8.6* 8.3*   GFR: Estimated Creatinine Clearance: 72.6 mL/min (by C-G formula based on SCr of 0.88 mg/dL). Liver Function Tests: Recent Labs  Lab 07/24/19 0503 07/25/19 0242  AST 19 18  ALT 17 15  ALKPHOS 60 57  BILITOT 0.7 0.5  PROT 7.2 5.8*  ALBUMIN 3.5 2.6*   Recent Labs  Lab 07/24/19 0503  LIPASE 19   No results for input(s): AMMONIA in the last 168 hours. Coagulation Profile: Recent Labs  Lab 07/25/19 0242  INR 1.2   Cardiac Enzymes: No results for input(s): CKTOTAL, CKMB, CKMBINDEX, TROPONINI in the last 168 hours. BNP (last 3 results) No results for input(s): PROBNP in the last 8760 hours. HbA1C: No results for input(s): HGBA1C in the last 72 hours. CBG: No results for input(s): GLUCAP in the last 168 hours. Lipid Profile: No results for input(s): CHOL, HDL, LDLCALC, TRIG, CHOLHDL, LDLDIRECT in the last 72 hours. Thyroid Function Tests: No results for input(s): TSH, T4TOTAL, FREET4, T3FREE, THYROIDAB in the last 72 hours. Anemia Panel: Recent Labs    07/25/19 0242  VITAMINB12 1,395*  FOLATE 5.6*  FERRITIN 37  TIBC 345  IRON 6*  RETICCTPCT 0.7   Sepsis Labs: Recent Labs  Lab 07/24/19 [id number] 07/24/19 1155 07/24/19 1415 07/25/19 0242  PROCALCITON  --   --   --  13.44  LATICACIDVEN 3.2* 1.9 1.4  --     Recent Results (from the past 240 hour(s))  Blood culture (routine x 2)     Status: None (Preliminary result)   Collection Time: 07/24/19  6:45 AM   Specimen:  BLOOD  Result Value Ref Range Status   Specimen Description   Final    BLOOD RIGHT ANTECUBITAL Performed at [facility] [place], [address] [place], [place] [address]    Special Requests   Final    BOTTLES DRAWN AEROBIC AND ANAEROBIC Blood Culture adequate volume Performed at [facility] [place], [address] [place], [place] [address]    Culture   Final    NO GROWTH 2 DAYS Performed at [facility], [address] [place], [place] [address]    Report Status PENDING  Incomplete  Blood culture (routine x 2)     Status: None (Preliminary result)   Collection Time: 07/24/19  7:25 AM   Specimen: BLOOD RIGHT HAND  Result Value Ref Range Status   Specimen Description   Final    BLOOD RIGHT HAND Performed at [facility] [place], [address] [place], [place] [address]  Special Requests   Final    BOTTLES DRAWN AEROBIC ONLY Blood Culture results may not be optimal due to an inadequate volume of blood received in culture bottles   Culture   Final    NO GROWTH 2 DAYS Performed at [facility], [address], [place], [place] [address]    Report Status PENDING  Incomplete  SARS CORONAVIRUS 2 (TAT 6-24 HRS) Nasopharyngeal Nasopharyngeal Swab     Status: None   Collection Time: 07/24/19  7:33 AM   Specimen: Nasopharyngeal Swab  Result Value Ref Range Status   SARS Coronavirus 2 NEGATIVE NEGATIVE Final    Comment: (NOTE) SARS-CoV-2 target nucleic acids are NOT DETECTED. The SARS-CoV-2 RNA is generally detectable in upper and lower respiratory specimens during the acute phase of infection. Negative results do not preclude SARS-CoV-2 infection, do not rule out co-infections with other pathogens, and should not be used as the sole basis for treatment or other patient management decisions. Negative results must be combined with clinical observations, patient history, and epidemiological information. The expected result is Negative. Fact Sheet for  Patients: HairSlick.no Fact Sheet for Healthcare Providers: quierodirigir.com This test is not yet approved or cleared by the [place] FDA and  has been authorized for detection and/or diagnosis of SARS-CoV-2 by FDA under an Emergency Use Authorization (EUA). This EUA will remain  in effect (meaning this test can be used) for the duration of the COVID-19 declaration under Section 56 4(b)(1) of the Act, 21 U.S.C. section 360bbb-3(b)(1), unless the authorization is terminated or revoked sooner. Performed at [facility], [address] [place], [place] [address]   MRSA PCR Screening     Status: Abnormal   Collection Time: 07/24/19 11:17 AM   Specimen: Nasal Mucosa; Nasopharyngeal  Result Value Ref Range Status   MRSA by PCR POSITIVE (A) NEGATIVE Final    Comment:        The GeneXpert MRSA Assay (FDA approved for NASAL specimens only), is one component of a comprehensive MRSA colonization surveillance program. It is not intended to diagnose MRSA infection nor to guide or monitor treatment for MRSA infections. RESULT CALLED TO, READ BACK BY AND VERIFIED WITH: [place] [id number] @ 1353 BY J SCOTTON Performed at [facility], [address], [place], [place] [address]          Radiology Studies: Dg Abd 1 View  Result Date: 07/25/2019 CLINICAL DATA:  Enteritis and small-bowel obstruction EXAM: ABDOMEN - 1 VIEW COMPARISON:  CT abdomen pelvis 07/24/2019 FINDINGS: Lung bases are clear. Monitoring leads overlie the patient. Normal heart size. Contrast material within the urinary bladder. Gas is demonstrated within nondilated loops of small bowel throughout the abdomen. Supine evaluation limited for free intraperitoneal air detection. IMPRESSION: Scattered nondilated loops of small bowel within the abdomen. Electronically Signed   By: Annia Belt M.D.   On: 07/25/2019 09:29        Scheduled  Meds: . amphetamine-dextroamphetamine  20 mg Oral Q24H  . amphetamine-dextroamphetamine  40 mg Oral BH-q7a  . Chlorhexidine Gluconate Cloth  6 each Topical Q0600  . enoxaparin (LOVENOX) injection  40 mg Subcutaneous Q24H  . mupirocin ointment  1 application Nasal BID  . thiamine injection  100 mg Intravenous Daily   Continuous Infusions: . sodium chloride 75 mL/hr at 07/26/19 1110  . cefTRIAXone (ROCEPHIN)  IV 2 g (07/26/19 1111)  . lactated ringers 125 mL/hr at 07/26/19 0700  . metronidazole Stopped (07/26/19 4696)  LOS: 2 days        Kathlen Mody, MD [facility] Pager [phone]  If 7PM-7AM, please contact night-coverage [email or web address] Password [facility] 07/26/2019, 11:42 AM

## 2019-07-26 NOTE — Progress Notes (Signed)
Report called to receiving Rn [id number]. Patient with no complaints at the current time. Will transfer via [place].

## 2019-07-27 LAB — CBC WITH DIFFERENTIAL/PLATELET
Abs Immature Granulocytes: 0.03 10*3/uL (ref 0.00–0.07)
Basophils Absolute: 0 10*3/uL (ref 0.0–0.1)
Basophils Relative: 1 %
Eosinophils Absolute: 0.4 10*3/uL (ref 0.0–0.5)
Eosinophils Relative: 4 %
HCT: 31.4 % — ABNORMAL LOW (ref 36.0–46.0)
Hemoglobin: 9.7 g/dL — ABNORMAL LOW (ref 12.0–15.0)
Immature Granulocytes: 0 %
Lymphocytes Relative: 21 %
Lymphs Abs: 1.7 10*3/uL (ref 0.7–4.0)
MCH: 25.7 pg — ABNORMAL LOW (ref 26.0–34.0)
MCHC: 30.9 g/dL (ref 30.0–36.0)
MCV: 83.3 fL (ref 80.0–100.0)
Monocytes Absolute: 0.7 10*3/uL (ref 0.1–1.0)
Monocytes Relative: 9 %
Neutro Abs: 5.3 10*3/uL (ref 1.7–7.7)
Neutrophils Relative %: 65 %
Platelets: 285 10*3/uL (ref 150–400)
RBC: 3.77 MIL/uL — ABNORMAL LOW (ref 3.87–5.11)
RDW: 14.9 % (ref 11.5–15.5)
WBC: 8.1 10*3/uL (ref 4.0–10.5)
nRBC: 0 % (ref 0.0–0.2)

## 2019-07-27 LAB — BASIC METABOLIC PANEL
Anion gap: 6 (ref 5–15)
BUN: 12 mg/dL (ref 6–20)
CO2: 23 mmol/L (ref 22–32)
Calcium: 8.1 mg/dL — ABNORMAL LOW (ref 8.9–10.3)
Chloride: 108 mmol/L (ref 98–111)
Creatinine, Ser: 0.63 mg/dL (ref 0.44–1.00)
GFR calc Af Amer: >60 mL/min (ref >60)
GFR calc non Af Amer: >60 mL/min (ref >60)
Glucose, Bld: 94 mg/dL (ref 70–99)
Potassium: 3.7 mmol/L (ref 3.5–5.1)
Sodium: 137 mmol/L (ref 135–145)

## 2019-07-27 MED ORDER — SIMETHICONE 80 MG PO CHEW
80.0000 mg | CHEWABLE_TABLET | Freq: Once | ORAL | Status: AC
  Administered 2019-07-27: 18:00:00 80 mg via ORAL
  Filled 2019-07-27: qty 1

## 2019-07-27 MED ORDER — ALUM & MAG HYDROXIDE-SIMETH 200-200-20 MG/5ML PO SUSP
30.0000 mL | ORAL | Status: DC | PRN
  Administered 2019-07-28 – 2019-07-31 (×5): 30 mL via ORAL
  Filled 2019-07-27 (×5): qty 30

## 2019-07-27 NOTE — Progress Notes (Addendum)
PROGRESS NOTE    Colleen Ramirez  ZOX:[id number] DOB: 12/23/1980 DOA: 07/24/2019 PCP: Patient, No Pcp Per    Brief Narrative:   38 year old lady with prior history of gastric bypass 7 [place] ago in [place], iron deficiency anemia history of chronic drug use, depression presents to ED for nausea vomiting diarrhea and abdominal pain.  CT of the abdomen pelvis shows evidence of regional enteritis of infectious or inflammatory in nature with a degree of small bowel obstruction versus ileus.  Surgery and gastroenterology consulted and recommendations given. Surgery recommends to advance diet as tolerated and continue with IV antibiotics.  Assessment & Plan:   Active Problems:   Sepsis ([facility])   Anemia   Depression   Drug abuse ([facility])   Enteritis   Sepsis possibly secondary to regional enteritis along with a component of small bowel obstruction. On admission patient was febrile, tachycardic, slightly hypotensive with lactic acidosis and elevated WBC count. Sepsis physiology improving with IV antibiotics and IV fluids.  Lactic acid normalized and procalcitonin improving.  Patient is afebrile this morning and WBC count normalized.  Continue with Rocephin and Flagyl for another 24 hours.   Dr Gerrit FriendsGerkin on board recommended to continue with IV antibiotics and advance diet as tolerated patient is currently on soft diet and will watch her pain while on soft diet.  Patient reports her nausea is better and she did not have any vomiting and she had one slightly formed stool this morning. GI consulted and recommended to continue the treatment for now.  No further interventions at this time.     Iron deficiency anemia along with folate deficiency. Iron and folate supplementation added.    History of drug abuse UDS pending.   Patient requesting Valium and sleep medication.  Social worker consulted for homelessness.  DVT prophylaxis: lovenox.  Code Status: FULL CODE Family Communication: none at  bedside.  Disposition Plan possible discharge in the next 24 hours if she is able to tolerate soft diet.  Consultants:   SURGERY.  Gastroenterology Dr. Dulce Sellarutlaw  Procedures: CT ABD and pelvis shows Previous gastric bypass with suggestion of gastrojejunostomy which is patent. Suture line over a bowel loop in the right anterior mid abdomen. Several small bowel loops with irregular fold thickening/wall thickening with single dilated small bowel loop measuring 3.1 cm. Findings are likely due to a regional enteritis of infectious or inflammatory nature. There may be a degree of small-bowel obstruction versus ileus. Bowel is very difficult to evaluate due to only a small amount of contrast in the proximal small bowel and sparse peritoneal fat.  Antimicrobials: IV  Rocephin and flagyl.   Subjective: Patient denies any chest pain or shortness of breath, nausea is resolved.  No vomiting.  Abdominal cramping has improved but not completely resolved.  Diarrhea has improved.  Objective: Vitals:   07/26/19 0800 07/26/19 0900 07/26/19 1200 07/27/19 0611  BP: (!) 143/100 117/83  (!) 119/91  Pulse: (!) 48 79  61  Resp: 15 18  20   Temp: 98.3 F (36.8 C)  98.2 F (36.8 C) 98.1 F (36.7 C)  TempSrc: Oral  Oral Oral  SpO2: 100% 100%  100%  Weight:      Height:        Intake/Output Summary (Last 24 hours) at 07/27/2019 1228 Last data filed at 07/27/2019 1046 Gross per 24 hour  Intake 477 ml  Output -  Net 477 ml   Filed Weights   07/24/19 0444 07/25/19 0400  Weight: 54.4 kg  56.5 kg    Examination:  General exam: Alert and appears comfortable not in any kind of distress. Respiratory system: Bilateral air entry fair, no wheezing or rhonchi Cardiovascular system: S1-S2 heard, regular rate rhythm, no JVD Gastrointestinal system: Abdomen is soft, tender in the right lower quadrant very mild, no signs of peritonitis bowel sounds are normal Central nervous system: Alert and oriented, grossly  nonfocal Extremities: No pedal edema Skin: No rashes Psychiatry: Patient is alert and in good spirits mood is appropriate    Data Reviewed: I have personally reviewed following labs and imaging studies  CBC: Recent Labs  Lab 07/24/19 0503 07/25/19 0242 07/26/19 0256 07/27/19 0839  WBC 17.7* 20.2* 15.6* 8.1  NEUTROABS 16.3*  --  12.4* 5.3  HGB 11.1* 10.5* 9.2* 9.7*  HCT 35.6* 34.3* 30.2* 31.4*  MCV 83.4 84.7 84.4 83.3  PLT 366 277 303 443   Basic Metabolic Panel: Recent Labs  Lab 07/24/19 0503 07/25/19 0242 07/26/19 0256 07/27/19 0839  NA 135 136 137 137  K 4.0 3.9 3.7 3.7  CL 100 104 107 108  CO2 25 23 22 23   GLUCOSE 126* 97 103* 94  BUN 17 18 22* 12  CREATININE 0.96 0.85 0.88 0.63  CALCIUM 9.1 8.6* 8.3* 8.1*   GFR: Estimated Creatinine Clearance: 79.9 mL/min (by C-G formula based on SCr of 0.63 mg/dL). Liver Function Tests: Recent Labs  Lab 07/24/19 0503 07/25/19 0242  AST 19 18  ALT 17 15  ALKPHOS 60 57  BILITOT 0.7 0.5  PROT 7.2 5.8*  ALBUMIN 3.5 2.6*   Recent Labs  Lab 07/24/19 0503  LIPASE 19   No results for input(s): AMMONIA in the last 168 hours. Coagulation Profile: Recent Labs  Lab 07/25/19 0242  INR 1.2   Cardiac Enzymes: No results for input(s): CKTOTAL, CKMB, CKMBINDEX, TROPONINI in the last 168 hours. BNP (last 3 results) No results for input(s): PROBNP in the last 8760 hours. HbA1C: No results for input(s): HGBA1C in the last 72 hours. CBG: No results for input(s): GLUCAP in the last 168 hours. Lipid Profile: No results for input(s): CHOL, HDL, LDLCALC, TRIG, CHOLHDL, LDLDIRECT in the last 72 hours. Thyroid Function Tests: No results for input(s): TSH, T4TOTAL, FREET4, T3FREE, THYROIDAB in the last 72 hours. Anemia Panel: Recent Labs    07/25/19 0242  VITAMINB12 1,395*  FOLATE 5.6*  FERRITIN 37  TIBC 345  IRON 6*  RETICCTPCT 0.7   Sepsis Labs: Recent Labs  Lab 07/24/19 1540 07/24/19 1155 07/24/19 1415 07/25/19  0242  PROCALCITON  --   --   --  13.44  LATICACIDVEN 3.2* 1.9 1.4  --     Recent Results (from the past 240 hour(s))  Blood culture (routine x 2)     Status: None (Preliminary result)   Collection Time: 07/24/19  6:45 AM   Specimen: BLOOD  Result Value Ref Range Status   Specimen Description   Final    BLOOD RIGHT ANTECUBITAL Performed at [facility], [address] [place], [place] [address]    Special Requests   Final    BOTTLES DRAWN AEROBIC AND ANAEROBIC Blood Culture adequate volume Performed at [facility], [address] [place], [place] [address]    Culture   Final    NO GROWTH 3 DAYS Performed at [facility], [place] [address], [place], [place] [address]    Report Status PENDING  Incomplete  Blood culture (routine x 2)  Status: None (Preliminary result)   Collection Time: 07/24/19  7:25 AM   Specimen: BLOOD RIGHT HAND  Result Value Ref Range Status   Specimen Description   Final    BLOOD RIGHT HAND Performed at [facility], [address] [place], [place] [address]    Special Requests   Final    BOTTLES DRAWN AEROBIC ONLY Blood Culture results may not be optimal due to an inadequate volume of blood received in culture bottles   Culture   Final    NO GROWTH 3 DAYS Performed at [facility], [address] [place], [place] [address]    Report Status PENDING  Incomplete  SARS CORONAVIRUS 2 (TAT 6-24 HRS) Nasopharyngeal Nasopharyngeal Swab     Status: None   Collection Time: 07/24/19  7:33 AM   Specimen: Nasopharyngeal Swab  Result Value Ref Range Status   SARS Coronavirus 2 NEGATIVE NEGATIVE Final    Comment: (NOTE) SARS-CoV-2 target nucleic acids are NOT DETECTED. The SARS-CoV-2 RNA is generally detectable in upper and lower respiratory specimens during the acute phase of infection. Negative results do not preclude SARS-CoV-2 infection, do not rule out co-infections with other pathogens, and should not be  used as the sole basis for treatment or other patient management decisions. Negative results must be combined with clinical observations, patient history, and epidemiological information. The expected result is Negative. Fact Sheet for Patients: HairSlick.no Fact Sheet for Healthcare Providers: quierodirigir.com This test is not yet approved or cleared by the [place] FDA and  has been authorized for detection and/or diagnosis of SARS-CoV-2 by FDA under an Emergency Use Authorization (EUA). This EUA will remain  in effect (meaning this test can be used) for the duration of the COVID-19 declaration under Section 56 4(b)(1) of the Act, 21 U.S.C. section 360bbb-3(b)(1), unless the authorization is terminated or revoked sooner. Performed at [facility], [address], [place], [place] [address]   MRSA PCR Screening     Status: Abnormal   Collection Time: 07/24/19 11:17 AM   Specimen: Nasal Mucosa; Nasopharyngeal  Result Value Ref Range Status   MRSA by PCR POSITIVE (A) NEGATIVE Final    Comment:        The GeneXpert MRSA Assay (FDA approved for NASAL specimens only), is one component of a comprehensive MRSA colonization surveillance program. It is not intended to diagnose MRSA infection nor to guide or monitor treatment for MRSA infections. RESULT CALLED TO, READ BACK BY AND VERIFIED WITH: [place] [id number] @ 1353 BY J SCOTTON Performed at [facility], [address] [place], [place] [address]          Radiology Studies: Dg Abd 2 Views  Result Date: 07/26/2019 CLINICAL DATA:  Right-sided abdominal pain recently.  Vomiting. EXAM: ABDOMEN - 2 VIEW COMPARISON:  CT abdomen 07/24/2019, KUB 07/25/2019 FINDINGS: There are scattered areas of small bowel and colonic air within the abdomen with a few small bowel air-fluid level centrally. Postsurgical changes in the epigastric region and left  mid abdomen. There is no evidence of pneumoperitoneum, portal venous gas or pneumatosis. There are no pathologic calcifications along the expected course of the ureters. The osseous structures are unremarkable. IMPRESSION: No significant overall change compared with the prior exam. Scattered loops of small bowel and colonic air with small bowel air-fluid levels in the mid abdomen. This appearance can be seen with enteritis versus a partial small bowel obstruction Electronically Signed   By: Alan Ripper  Patel   On: 07/26/2019 15:12        Scheduled Meds: . amphetamine-dextroamphetamine  20 mg Oral Q24H  . amphetamine-dextroamphetamine  40 mg Oral BH-q7a  . Chlorhexidine Gluconate Cloth  6 each Topical Q0600  . enoxaparin (LOVENOX) injection  40 mg Subcutaneous Q24H  . ferrous sulfate  325 mg Oral Q breakfast  . folic acid  1 mg Oral Daily  . mupirocin ointment  1 application Nasal BID  . thiamine injection  100 mg Intravenous Daily   Continuous Infusions: . sodium chloride 1,000 mL (07/27/19 1132)  . cefTRIAXone (ROCEPHIN)  IV 2 g (07/27/19 1133)  . metronidazole 500 mg (07/27/19 0356)     LOS: 3 days        Kathlen Mody, MD [facility]

## 2019-07-27 NOTE — Progress Notes (Signed)
    CC: Abdominal pain  Subjective: She is doing better.  She still complaining of some generalized tenderness and discomfort, especially with passing gas and BMs.  She is up to a full liquid diet but expecting a soft diet.  Abdomen is soft, and minimally tender.  Objective: Vital signs in last 24 hours: Temp:  [98.1 F (36.7 C)-98.2 F (36.8 C)] 98.1 F (36.7 C) (11/09 0611) Pulse Rate:  [61-79] 61 (11/09 0611) Resp:  [18-20] 20 (11/09 0611) BP: (117-119)/(83-91) 119/91 (11/09 0611) SpO2:  [100 %] 100 % (11/09 0611) Last BM Date: 07/26/19 600 p.o. 600 IV Voided x1 Positive BM. Afebrile vital signs are stable; intermittent blood pressure elevation. WBC 8.1 Two-view abdominal films yesterday: Enteritis versus partial small bowel obstruction   Intake/Output from previous day: 11/08 0701 - 11/09 0700 In: 1213 [P.O.:600; I.V.:493.9; IV Piggyback:119.1] Out: -  Intake/Output this shift: No intake/output data recorded.  General appearance: alert, cooperative and no distress Resp: clear to auscultation bilaterally GI: Soft, some generalized tenderness but nothing acute.  No peritonitis.  She is tolerating full liquids and having bowel movements.  Lab Results:  Recent Labs    07/26/19 0256 07/27/19 0839  WBC 15.6* 8.1  HGB 9.2* 9.7*  HCT 30.2* 31.4*  PLT 303 285    BMET Recent Labs    07/25/19 0242 07/26/19 0256  NA 136 137  K 3.9 3.7  CL 104 107  CO2 23 22  GLUCOSE 97 103*  BUN 18 22*  CREATININE 0.85 0.88  CALCIUM 8.6* 8.3*   PT/INR Recent Labs    07/25/19 0242  LABPROT 15.3*  INR 1.2    Recent Labs  Lab 07/24/19 0503 07/25/19 0242  AST 19 18  ALT 17 15  ALKPHOS 60 57  BILITOT 0.7 0.5  PROT 7.2 5.8*  ALBUMIN 3.5 2.6*     Lipase     Component Value Date/Time   LIPASE 19 07/24/2019 0503     Medications: . amphetamine-dextroamphetamine  20 mg Oral Q24H  . amphetamine-dextroamphetamine  40 mg Oral BH-q7a  . Chlorhexidine Gluconate  Cloth  6 each Topical Q0600  . enoxaparin (LOVENOX) injection  40 mg Subcutaneous Q24H  . ferrous sulfate  325 mg Oral Q breakfast  . folic acid  1 mg Oral Daily  . mupirocin ointment  1 application Nasal BID  . thiamine injection  100 mg Intravenous Daily    Assessment/Plan Abdominal pain-enteritis vs sbo - CT shows small bowel wall thickening with single dilated loop, suggestive of infectious vs inflammatory process - WBCimproved to 15K this AM - IV Zosyn empiricallyx 1 dose, now Flagyl - per medical service - tolerating clear liquid diet - OK to advance diet from surgical standpoint  Hx of gastric bypass- in [place], approx 7 years ago IV Drug abuse, hx of tobacco, ETOH,cocaine Severe malnutrition -prealbumin less than 5  FEN: IVF, full liquids VTE: SCDs, ok for chemical prophylaxis from a surgical standpoint GY:[id number] 11/6 >>day 4  Plan: With her ongoing tenderness, especially with passing flatus and BMs I would leave her on full liquids a little bit longer.  You can advance her diet as tolerated.  No surgical issues currently would continue with medical management.  Would also recommend nutrition consult to help with her severe malnutrition.  Please call if we can be of further assistance.       LOS: 3 days    Kaelyn Innocent 07/27/2019 Please see Amion

## 2019-07-28 LAB — PROCALCITONIN: Procalcitonin: 0.55 ng/mL

## 2019-07-28 MED ORDER — ADULT MULTIVITAMIN W/MINERALS CH
1.0000 | ORAL_TABLET | Freq: Every day | ORAL | Status: DC
  Administered 2019-07-28 – 2019-07-31 (×4): 1 via ORAL
  Filled 2019-07-28 (×4): qty 1

## 2019-07-28 MED ORDER — ENSURE ENLIVE PO LIQD
237.0000 mL | Freq: Two times a day (BID) | ORAL | Status: DC
  Administered 2019-07-28 – 2019-07-31 (×6): 237 mL via ORAL

## 2019-07-28 MED ORDER — VITAMIN B-1 100 MG PO TABS
100.0000 mg | ORAL_TABLET | Freq: Every day | ORAL | Status: DC
  Administered 2019-07-29 – 2019-07-31 (×3): 100 mg via ORAL
  Filled 2019-07-28 (×3): qty 1

## 2019-07-28 NOTE — Progress Notes (Signed)
Initial Nutrition Assessment  INTERVENTION:   -Ensure Enlive po BID, each supplement provides 350 kcal and 20 grams of protein -Multivitamin with minerals daily  NUTRITION DIAGNOSIS:   Inadequate oral intake related to nausea, vomiting, diarrhea as evidenced by per patient/family report.  GOAL:   Patient will meet greater than or equal to 90% of their needs  MONITOR:   PO intake, Supplement acceptance, Labs, Weight trends, I & O's  REASON FOR ASSESSMENT:   Consult Assessment of nutrition requirement/status  ASSESSMENT:   38 year old lady with prior history of gastric bypass 7 years ago in [place], iron deficiency anemia history of chronic drug use, depression presents to ED for nausea vomiting diarrhea and abdominal pain.  **RD working remotely**  Patient reports having N/V and diarrhea PTA. Pt with history of drug use. UDS+ for amphetamines, opiates and cocaine. Pt was feeling better and was on a soft diet, consuming 50-100% of meals yesterday. She then developed some nausea and abdominal cramps. MD has now downgraded her diet to full liquids for today. Pt has not eaten yet today. Will order Ensure supplements given poor PO. Will order daily MVI as well given h/o gastric bypass surgery.   No weight records available PTA. Will continue to monitor weight trends.   I/Os: +14.7L since admit Labs reviewed. Medications: Ferrous sulfate tablet, Folic acid tablet, Thiamine tablet, Zofran tablet  NUTRITION - FOCUSED PHYSICAL EXAM:  Working remotely.  Diet Order:   Diet Order            Diet full liquid Room service appropriate? Yes; Fluid consistency: Thin  Diet effective now              EDUCATION NEEDS:   No education needs have been identified at this time  Skin:  Skin Assessment: Reviewed RN Assessment  Last BM:  11/9 -type 4  Height:   Ht Readings from Last 1 Encounters:  07/24/19 5' 3.5" (1.613 m)    Weight:   Wt Readings from Last 1 Encounters:   07/25/19 56.5 kg    Ideal Body Weight:  52.3 kg  BMI:  Body mass index is 21.72 kg/m.  Estimated Nutritional Needs:   Kcal:  1500-1700  Protein:  70-80g  Fluid:  1.7L/day  Clayton Bibles, MS, RD, LDN Inpatient Clinical Dietitian Pager: [phone] After Hours Pager: [phone]

## 2019-07-28 NOTE — Progress Notes (Signed)
PROGRESS NOTE    Colleen Ramirez  ZOX:[id number] DOB: 09/20/1980 DOA: 07/24/2019 PCP: Patient, No Pcp Per    Brief Narrative:   38 year old lady with prior history of gastric [place] ago [place], iron deficiency anemia history of chronic drug use, depression presents to ED for nausea vomiting diarrhea and abdominal pain.  CT of the abdomen pelvis shows evidence of regional enteritis of infectious or inflammatory in nature with a degree of small bowel obstruction versus ileus.  Surgery and gastroenterology consulted and recommendations given. Surgery recommends to advance diet as tolerated and continue with IV antibiotics. Patient seen and examined today. She reports being on soft diet for lunch and dinner but started getting abdominal cramps since last night associated with nausea. We will switch back to full liquid diet and monitor her for another 24 hours on IV antibiotics and IV fluids.   Assessment & Plan:   Active Problems:   Sepsis (HCC)   Anemia   Depression   Drug abuse (HCC)   Enteritis   Sepsis possibly secondary to regional enteritis along with a component of small bowel obstruction. On admission patient was febrile, tachycardic, slightly hypotensive with lactic acidosis and elevated WBC count. Sepsis physiology improved with IV antibiotics and IV fluids.  Lactic acid normalized and repeat pro calcitonin.  Patient is afebrile and WBC count normalized.  Continue with Rocephin and Flagyl.   Dr Gerrit FriendsGerkin on board recommended to continue with IV antibiotics and advance diet as tolerated. Patient was started on clears and slowly advance to soft yesterday.  But patient reports she has abdominal cramping and pain associated with nausea after having soft diet for dinner.  She did not eat anything all day today so far,  so we will change to full liquid diet for today,  monitor for the next 24 hours.  GI consulted and recommended to continue the treatment for now.  No further  interventions at this time.     Iron deficiency anemia along with folate deficiency. Iron and folate supplementation added.  Iron levels low and supplementation on board.    History of drug abuse UDS pending.   Patient requesting Valium and sleep medication.  Social worker consulted for homelessness.  DVT prophylaxis: lovenox.  Code Status: FULL CODE Family Communication: none at bedside.  Disposition Plan possible discharge in the next 24 hours if she is able to tolerate soft diet.  Consultants:   SURGERY.  Gastroenterology Dr. Dulce Sellarutlaw  Procedures: CT ABD and pelvis shows Previous gastric bypass with suggestion of gastrojejunostomy which is patent. Suture line over a bowel loop in the right anterior mid abdomen. Several small bowel loops with irregular fold thickening/wall thickening with single dilated small bowel loop measuring 3.1 cm. Findings are likely due to a regional enteritis of infectious or inflammatory nature. There may be a degree of small-bowel obstruction versus ileus. Bowel is very difficult to evaluate due to only a small amount of contrast in the proximal small bowel and sparse peritoneal fat.  Antimicrobials: IV  Rocephin and flagyl.   Subjective: Patient reports nausea, abdominal cramping and pain after she had soft diet yesterday.  No vomiting.  Will downgrade the diet to full liquid today and monitor.  Objective: Vitals:   07/27/19 0611 07/27/19 1425 07/27/19 2347 07/28/19 0706  BP: (!) 119/91 116/89 (!) 134/97 129/84  Pulse: 61 89 (!) 56 (!) 57  Resp: 20 17 18  (!) 21  Temp: 98.1 F (36.7 C) 98.2 F (36.8 C) 98.6  F (37 C) 97.6 F (36.4 C)  TempSrc: Oral Oral Oral Oral  SpO2: 100% 100% 100% 100%  Weight:      Height:        Intake/Output Summary (Last 24 hours) at 07/28/2019 1331 Last data filed at 07/28/2019 0509 Gross per 24 hour  Intake 2101.49 ml  Output -  Net 2101.49 ml   Filed Weights   07/24/19 0444 07/25/19 0400  Weight:  54.4 kg 56.5 kg    Examination:  General exam: Alert and mild distress from abdominal pain Respiratory system: Clear to auscultation bilaterally, no wheezing or rhonchi Cardiovascular system: S1-S2 heard, regular rate rhythm, no JVD Gastrointestinal system: Abdomen is soft, mild tenderness in the right lower quadrant, bowel sounds normal, no distention and no signs of peritonitis. Central nervous system: Alert and oriented, grossly nonfocal Extremities: No pedal edema or cyanosis or clubbing Skin: No rashes Psychiatry: Mood is appropriate    Data Reviewed: I have personally reviewed following labs and imaging studies  CBC: Recent Labs  Lab 07/24/19 0503 07/25/19 0242 07/26/19 0256 07/27/19 0839  WBC 17.7* 20.2* 15.6* 8.1  NEUTROABS 16.3*  --  12.4* 5.3  HGB 11.1* 10.5* 9.2* 9.7*  HCT 35.6* 34.3* 30.2* 31.4*  MCV 83.4 84.7 84.4 83.3  PLT 366 277 303 285   Basic Metabolic Panel: Recent Labs  Lab 07/24/19 0503 07/25/19 0242 07/26/19 0256 07/27/19 0839  NA 135 136 137 137  K 4.0 3.9 3.7 3.7  CL 100 104 107 108  CO2 25 23 22 23   GLUCOSE 126* 97 103* 94  BUN 17 18 22* 12  CREATININE 0.96 0.85 0.88 0.63  CALCIUM 9.1 8.6* 8.3* 8.1*   GFR: Estimated Creatinine Clearance: 79.9 mL/min (by C-G formula based on SCr of 0.63 mg/dL). Liver Function Tests: Recent Labs  Lab 07/24/19 0503 07/25/19 0242  AST 19 18  ALT 17 15  ALKPHOS 60 57  BILITOT 0.7 0.5  PROT 7.2 5.8*  ALBUMIN 3.5 2.6*   Recent Labs  Lab 07/24/19 0503  LIPASE 19   No results for input(s): AMMONIA in the last 168 hours. Coagulation Profile: Recent Labs  Lab 07/25/19 0242  INR 1.2   Cardiac Enzymes: No results for input(s): CKTOTAL, CKMB, CKMBINDEX, TROPONINI in the last 168 hours. BNP (last 3 results) No results for input(s): PROBNP in the last 8760 hours. HbA1C: No results for input(s): HGBA1C in the last 72 hours. CBG: No results for input(s): GLUCAP in the last 168 hours. Lipid  Profile: No results for input(s): CHOL, HDL, LDLCALC, TRIG, CHOLHDL, LDLDIRECT in the last 72 hours. Thyroid Function Tests: No results for input(s): TSH, T4TOTAL, FREET4, T3FREE, THYROIDAB in the last 72 hours. Anemia Panel: No results for input(s): VITAMINB12, FOLATE, FERRITIN, TIBC, IRON, RETICCTPCT in the last 72 hours. Sepsis Labs: Recent Labs  Lab 07/24/19 13/06/20 07/24/19 1155 07/24/19 1415 07/25/19 0242  PROCALCITON  --   --   --  13.44  LATICACIDVEN 3.2* 1.9 1.4  --     Recent Results (from the past 240 hour(s))  Blood culture (routine x 2)     Status: None (Preliminary result)   Collection Time: 07/24/19  6:45 AM   Specimen: BLOOD  Result Value Ref Range Status   Specimen Description   Final    BLOOD RIGHT ANTECUBITAL Performed at [facility], [address] [place], [place]    Special Requests   Final    BOTTLES DRAWN AEROBIC AND ANAEROBIC Blood  Culture adequate volume Performed at [facility], [address] [place], [place] [address]    Culture   Final    NO GROWTH 4 DAYS Performed at [facility], [address], [place], [place] [address]    Report Status PENDING  Incomplete  Blood culture (routine x 2)     Status: None (Preliminary result)   Collection Time: 07/24/19  7:25 AM   Specimen: BLOOD RIGHT HAND  Result Value Ref Range Status   Specimen Description   Final    BLOOD RIGHT HAND Performed at [facility], [address] [place], [place] [address]    Special Requests   Final    BOTTLES DRAWN AEROBIC ONLY Blood Culture results may not be optimal due to an inadequate volume of blood received in culture bottles   Culture   Final    NO GROWTH 4 DAYS Performed at [facility], [address], [place], [place] [address]    Report Status PENDING  Incomplete  SARS CORONAVIRUS 2 (TAT 6-24 HRS) Nasopharyngeal Nasopharyngeal Swab     Status: None   Collection Time: 07/24/19  7:33 AM   Specimen:  Nasopharyngeal Swab  Result Value Ref Range Status   SARS Coronavirus 2 NEGATIVE NEGATIVE Final    Comment: (NOTE) SARS-CoV-2 target nucleic acids are NOT DETECTED. The SARS-CoV-2 RNA is generally detectable in upper and lower respiratory specimens during the acute phase of infection. Negative results do not preclude SARS-CoV-2 infection, do not rule out co-infections with other pathogens, and should not be used as the sole basis for treatment or other patient management decisions. Negative results must be combined with clinical observations, patient history, and epidemiological information. The expected result is Negative. Fact Sheet for Patients: HairSlick.no Fact Sheet for Healthcare Providers: quierodirigir.com This test is not yet approved or cleared by the [place] FDA and  has been authorized for detection and/or diagnosis of SARS-CoV-2 by FDA under an Emergency Use Authorization (EUA). This EUA will remain  in effect (meaning this test can be used) for the duration of the COVID-19 declaration under Section 56 4(b)(1) of the Act, 21 U.S.C. section 360bbb-3(b)(1), unless the authorization is terminated or revoked sooner. Performed at [facility], [address] [place], [place] [address]   MRSA PCR Screening     Status: Abnormal   Collection Time: 07/24/19 11:17 AM   Specimen: Nasal Mucosa; Nasopharyngeal  Result Value Ref Range Status   MRSA by PCR POSITIVE (A) NEGATIVE Final    Comment:        The GeneXpert MRSA Assay (FDA approved for NASAL specimens only), is one component of a comprehensive MRSA colonization surveillance program. It is not intended to diagnose MRSA infection nor to guide or monitor treatment for MRSA infections. RESULT CALLED TO, READ BACK BY AND VERIFIED WITH: [place] [id number] @ 1353 BY J SCOTTON Performed at [facility], [address]  [place], [place] [address]          Radiology Studies: Dg Abd 2 Views  Result Date: 07/26/2019 CLINICAL DATA:  Right-sided abdominal pain recently.  Vomiting. EXAM: ABDOMEN - 2 VIEW COMPARISON:  CT abdomen 07/24/2019, KUB 07/25/2019 FINDINGS: There are scattered areas of small bowel and colonic air within the abdomen with a few small bowel air-fluid level centrally. Postsurgical changes in the epigastric region and left mid abdomen. There is no evidence of pneumoperitoneum, portal venous gas or pneumatosis. There  are no pathologic calcifications along the expected course of the ureters. The osseous structures are unremarkable. IMPRESSION: No significant overall change compared with the prior exam. Scattered loops of small bowel and colonic air with small bowel air-fluid levels in the mid abdomen. This appearance can be seen with enteritis versus a partial small bowel obstruction Electronically Signed   By: Kathreen Devoid   On: 07/26/2019 15:12        Scheduled Meds: . amphetamine-dextroamphetamine  20 mg Oral Q24H  . amphetamine-dextroamphetamine  40 mg Oral BH-q7a  . Chlorhexidine Gluconate Cloth  6 each Topical Q0600  . enoxaparin (LOVENOX) injection  40 mg Subcutaneous Q24H  . ferrous sulfate  325 mg Oral Q breakfast  . folic acid  1 mg Oral Daily  . mupirocin ointment  1 application Nasal BID  . [START ON 07/29/2019] thiamine  100 mg Oral Daily   Continuous Infusions: . sodium chloride 75 mL/hr at 07/28/19 0323  . cefTRIAXone (ROCEPHIN)  IV 2 g (07/28/19 1320)  . metronidazole 500 mg (07/28/19 1207)     LOS: 4 days        Hosie Poisson, MD [facility]

## 2019-07-28 NOTE — Progress Notes (Signed)
   07/28/19 1100  Clinical Encounter Type  Visited With Patient  Visit Type Initial;Psychological support;Spiritual support  Referral From Nurse  Consult/Referral To Chaplain  Spiritual Encounters  Spiritual Needs Emotional;Other (Comment) (Clothing )  Stress Factors  Patient Stress Factors Other (Comment) (Clothing)   I responded to a request to help provide the patient clothing from the [place].   Chaplain Shanon Ace M.Div., [facility]

## 2019-07-29 DIAGNOSIS — Z590 Homelessness: Secondary | ICD-10-CM

## 2019-07-29 DIAGNOSIS — R112 Nausea with vomiting, unspecified: Secondary | ICD-10-CM

## 2019-07-29 DIAGNOSIS — F1990 Other psychoactive substance use, unspecified, uncomplicated: Secondary | ICD-10-CM

## 2019-07-29 DIAGNOSIS — R1011 Right upper quadrant pain: Secondary | ICD-10-CM

## 2019-07-29 DIAGNOSIS — D529 Folate deficiency anemia, unspecified: Secondary | ICD-10-CM

## 2019-07-29 DIAGNOSIS — D500 Iron deficiency anemia secondary to blood loss (chronic): Secondary | ICD-10-CM

## 2019-07-29 DIAGNOSIS — A419 Sepsis, unspecified organism: Principal | ICD-10-CM

## 2019-07-29 LAB — CULTURE, BLOOD (ROUTINE X 2)
Culture: NO GROWTH
Culture: NO GROWTH
Special Requests: ADEQUATE

## 2019-07-29 LAB — PROCALCITONIN: Procalcitonin: 0.3 ng/mL

## 2019-07-29 MED ORDER — SODIUM CHLORIDE 0.9 % IV SOLN
510.0000 mg | Freq: Once | INTRAVENOUS | Status: AC
  Administered 2019-07-29: 510 mg via INTRAVENOUS
  Filled 2019-07-29: qty 17

## 2019-07-29 NOTE — Progress Notes (Signed)
Patient refused Toradol for pain stating that it doesn't help with her abdominal pain only want to take the Fentanyl.

## 2019-07-29 NOTE — Progress Notes (Signed)
Patient refused Lovenox

## 2019-07-29 NOTE — TOC Initial Note (Signed)
Transition of Care [facility]) - Initial/Assessment Note    Patient Details  Name: Colleen Ramirez MRN: [id number] Date of Birth: 02-20-1981  [facility] of [facility] CM/SW Contact:    Lynnell Catalan, RN Phone Number: [phone] 07/29/2019, 12:47 PM  Clinical Narrative:                 This CM met with pt at bedside for DC planning. Pt states she was living in a hotel and is homeless now. She states her clothes were at the hotel and she doesn't have clothes for discharge. This CM provided pt with clothes from clothes bank. This CM also provided pt with shelter resources, substance abuse resources and contact information for the [facility]. Pt states that she has a son in town and friends that can give her rides, but she can't stay with them. Pt to call shelter resources to see where bed available.  Expected Discharge Plan: Homeless Shelter Barriers to Discharge: Continued Medical Work up   Patient Goals and CMS Choice        Expected Discharge Plan and Services Expected Discharge Plan: [place]       Living arrangements for the past 2 months: Hotel/Motel                                      Prior Living Arrangements/Services Living arrangements for the past 2 months: Hotel/Motel            Need for Family Participation in Patient Care: Yes (Comment) Care giver support system in place?: Yes (comment)      Activities of Daily Living Home Assistive Devices/Equipment: None ADL Screening (condition at time of admission) Patient's cognitive ability adequate to safely complete daily activities?: No(patient very weak and drowsy) Is the patient deaf or have difficulty hearing?: No Does the patient have difficulty seeing, even when wearing glasses/contacts?: No Does the patient have difficulty concentrating, remembering, or making decisions?: Yes Patient able to express need for assistance with ADLs?: Yes Does the patient have difficulty dressing or bathing?:  Yes Independently performs ADLs?: No Communication: Independent Dressing (OT): Needs assistance Is this a change from baseline?: Change from baseline, expected to last >3 days Grooming: Needs assistance Is this a change from baseline?: Change from baseline, expected to last >3 days Feeding: Needs assistance Is this a change from baseline?: Change from baseline, expected to last >3 days Bathing: Needs assistance Is this a change from baseline?: Change from baseline, expected to last >3 days Toileting: Needs assistance Is this a change from baseline?: Change from baseline, expected to last >3days In/Out Bed: Needs assistance Is this a change from baseline?: Change from baseline, expected to last >3 days Walks in Home: Needs assistance Is this a change from baseline?: Change from baseline, expected to last >3 days Does the patient have difficulty walking or climbing stairs?: Yes(secondary to weakness) Weakness of Legs: Both Weakness of Arms/Hands: Both  Permission Sought/Granted                  Emotional Assessment Appearance:: Appears older than stated age            Admission diagnosis:  Abd Pain Patient Active Problem List   Diagnosis Date Noted  . Sepsis ([place]) 07/24/2019  . Anemia   . Depression   . Drug abuse ([place])   . Enteritis    PCP:  Patient, No Pcp Per  Pharmacy:   [id number] [id number]: [phone]: [phone]    Social Determinants of Health (SDOH) Interventions    Readmission Risk Interventions No flowsheet data found.

## 2019-07-29 NOTE — Progress Notes (Signed)
PROGRESS NOTE  Colleen Ramirez HYQ:[id number] DOB: [birth year]   PCP: Patient, No Pcp Per  Patient is from: Home  DOA: 07/24/2019 LOS: 5  Brief Narrative / Interim history: 38 year old female with history of gastric bypass 7 years ago and-year-old, iron deficiency anemia, polysubstance use and depression presenting with nausea, vomiting and abdominal pain and admitted for sepsis due to possible enteritis.  CT abdomen and pelvis revealed regional enteritis concerning for possible infection or inflammation.  Neurosurgery and gastroenterology consulted and recommended medical management with IV antibiotics, IV fluid and adjustment of diet consistency.   Subjective: Continues to endorse abdominal pain mainly to the right.  Also endorses nausea.  She says she had 4 loose bowel movements in the last 24 hours.  She describes the pain as sharp.  She rates her pain a 10/10.  Reports radiation to her back on the right.  She denies emesis but spitting, chest pain, dyspnea, fever or UTI symptoms.  She is anxious about trying diet.  Objective: Vitals:   07/28/19 1515 07/28/19 2044 07/29/19 0456 07/29/19 1426  BP: 126/81 133/87 134/86 (!) 128/93  Pulse: 62 63 60 95  Resp: 18 17 17 18   Temp: 98.5 F (36.9 C) 98.7 F (37.1 C) 99.1 F (37.3 C) 98.8 F (37.1 C)  TempSrc: Oral Oral Oral Oral  SpO2: 99% 100% 100% 100%  Weight:      Height:       No intake or output data in the 24 hours ending 07/29/19 1624 Filed Weights   07/24/19 0444 07/25/19 0400  Weight: 54.4 kg 56.5 kg    Examination:  GENERAL: No acute distress.  Appears well.  HEENT: MMM.  Vision and hearing grossly intact.  NECK: Supple.  No apparent JVD.  RESP:  No IWOB. Good air movement bilaterally. CVS:  RRR. Heart sounds normal.  ABD/GI/GU: Bowel sounds present. Soft.  Diffuse tenderness mainly to the right.  MSK/EXT:  No apparent deformity or edema. Moves extremities. SKIN: no apparent skin lesion or wound NEURO: Awake, alert  and oriented appropriately.  No gross deficit.  PSYCH: Calm. Normal affect.   Assessment & Plan: Sepsis secondary to regional enteritis/possible SBO: Sepsis physiology resolved. -CT abdomen and pelvis revealed regional enteritis/possible SBO. -Patient is having bowel movements but still with significant pain and anxious about trying diet. -Continue full liquid diet and advance as tolerated -Continue Rocephin and IV Flagyl-procalcitonin downtrending.  Lactic acidosis and leukocytosis resolved. -Appreciate GI and GS-conservative care with antibiotics, IV fluid, pain control and diet.  Iron deficiency anemia along with folate deficiency: Unknown baseline..  Anemia panel consistent with iron deficiency, saturation ratio of 2% and folate deficiency. -Hgb 11.1 (admit)> 9.7.  MCV 84. -Infuse IV Feraheme -Continue p.o. ferrous sulfate and folic acid  Polysubstance use: UDS positive for cocaine, opiate and amphetamine (has Rx) -Encouraged cessation  Social issues/homelessness -CSW consulted.  ADD/anxiety/insomnia -Continue home Adderall, trazodone and Valium.     Nutrition Problem: Inadequate oral intake Etiology: nausea, vomiting, diarrhea  Signs/Symptoms: per patient/family report  Interventions: Ensure Enlive (each supplement provides 350kcal and 20 grams of protein), MVI   DVT prophylaxis: Subcu Lovenox Code Status: Full code Family Communication: Patient and/or RN. Available if any question.  Disposition Plan: Remains inpatient pending improvement in pain and p.o. tolerance Consultants: General surgery and GI  Procedures:  None  Microbiology summarized: 11/6-COVID-19 negative 11/6-blood cultures negative  Sch Meds:  Scheduled Meds: . amphetamine-dextroamphetamine  20 mg Oral Q24H  . amphetamine-dextroamphetamine  40 mg Oral BH-q7a  .  Chlorhexidine Gluconate Cloth  6 each Topical Q0600  . enoxaparin (LOVENOX) injection  40 mg Subcutaneous Q24H  . feeding supplement  (ENSURE ENLIVE)  237 mL Oral BID BM  . ferrous sulfate  325 mg Oral Q breakfast  . folic acid  1 mg Oral Daily  . multivitamin with minerals  1 tablet Oral Daily  . mupirocin ointment  1 application Nasal BID  . thiamine  100 mg Oral Daily   Continuous Infusions: . sodium chloride 75 mL/hr at 07/28/19 2024  . cefTRIAXone (ROCEPHIN)  IV 2 g (07/29/19 1113)  . metronidazole 500 mg (07/29/19 1212)   PRN Meds:.acetaminophen **OR** acetaminophen, alum & mag hydroxide-simeth, diazepam, fentaNYL (SUBLIMAZE) injection, ondansetron **OR** ondansetron (ZOFRAN) IV, traMADol, traZODone  Antimicrobials: Anti-infectives (From admission, onward)   Start     Dose/Rate Route Frequency Ordered Stop   07/24/19 1200  cefTRIAXone (ROCEPHIN) 2 g in sodium chloride 0.9 % 100 mL IVPB     2 g 200 mL/hr over 30 Minutes Intravenous Every 24 hours 07/24/19 1114     07/24/19 1200  metroNIDAZOLE (FLAGYL) IVPB 500 mg     500 mg 100 mL/hr over 60 Minutes Intravenous Every 8 hours 07/24/19 1114     07/24/19 0730  piperacillin-tazobactam (ZOSYN) IVPB 3.375 g     3.375 g 100 mL/hr over 30 Minutes Intravenous  Once 07/24/19 0719 07/24/19 0814   07/24/19 0730  vancomycin (VANCOCIN) IVPB 1000 mg/200 mL premix     1,000 mg 200 mL/hr over 60 Minutes Intravenous  Once 07/24/19 0719 07/24/19 0921       I have personally reviewed the following labs and images: CBC: Recent Labs  Lab 07/24/19 0503 07/25/19 0242 07/26/19 0256 07/27/19 0839  WBC 17.7* 20.2* 15.6* 8.1  NEUTROABS 16.3*  --  12.4* 5.3  HGB 11.1* 10.5* 9.2* 9.7*  HCT 35.6* 34.3* 30.2* 31.4*  MCV 83.4 84.7 84.4 83.3  PLT 366 277 303 285   BMP &GFR Recent Labs  Lab 07/24/19 0503 07/25/19 0242 07/26/19 0256 07/27/19 0839  NA 135 136 137 137  K 4.0 3.9 3.7 3.7  CL 100 104 107 108  CO2 GLUCOSE 126* 97 103* 94  BUN 17 18 22* 12  CREATININE 0.96 0.85 0.88 0.63  CALCIUM 9.1 8.6* 8.3* 8.1*   Estimated Creatinine Clearance: 79.9  mL/min (by C-G formula based on SCr of 0.63 mg/dL). Liver & Pancreas: Recent Labs  Lab 07/24/19 0503 07/25/19 0242  AST 19 18  ALT 17 15  ALKPHOS 60 57  BILITOT 0.7 0.5  PROT 7.2 5.8*  ALBUMIN 3.5 2.6*   Recent Labs  Lab 07/24/19 0503  LIPASE 19   No results for input(s): AMMONIA in the last 168 hours. Diabetic: No results for input(s): HGBA1C in the last 72 hours. No results for input(s): GLUCAP in the last 168 hours. Cardiac Enzymes: No results for input(s): CKTOTAL, CKMB, CKMBINDEX, TROPONINI in the last 168 hours. No results for input(s): PROBNP in the last 8760 hours. Coagulation Profile: Recent Labs  Lab 07/25/19 0242  INR 1.2   Thyroid Function Tests: No results for input(s): TSH, T4TOTAL, FREET4, T3FREE, THYROIDAB in the last 72 hours. Lipid Profile: No results for input(s): CHOL, HDL, LDLCALC, TRIG, CHOLHDL, LDLDIRECT in the last 72 hours. Anemia Panel: No results for input(s): VITAMINB12, FOLATE, FERRITIN, TIBC, IRON, RETICCTPCT in the last 72 hours. Urine analysis:    Component Value Date/Time   COLORURINE YELLOW 07/24/2019 0637   APPEARANCEUR  HAZY (A) 07/24/2019 0637   LABSPEC >1.030 (H) 07/24/2019 0637   PHURINE 6.0 07/24/2019 0637   GLUCOSEU NEGATIVE 07/24/2019 0637   HGBUR NEGATIVE 07/24/2019 0637   [place] NEGATIVE 07/24/2019 [place] 07/24/2019 0637   PROTEINUR 30 (A) 07/24/2019 0637   NITRITE NEGATIVE 07/24/2019 [place] 07/24/2019 0637   Sepsis Labs: Invalid input(s): PROCALCITONIN, [place]  Microbiology: Recent Results (from the past 240 hour(s))  Blood culture (routine x 2)     Status: None   Collection Time: 07/24/19  6:45 AM   Specimen: BLOOD  Result Value Ref Range Status   Specimen Description   Final    BLOOD RIGHT ANTECUBITAL Performed at [facility], [address] [place], [place] [address]    Special Requests   Final    BOTTLES DRAWN AEROBIC AND ANAEROBIC Blood  Culture adequate volume Performed at [facility], [address] [place], [place] [address]    Culture   Final    NO GROWTH 5 DAYS Performed at [facility], [place] [address] [place], [place] [address]    Report Status 07/29/2019 FINAL  Final  Blood culture (routine x 2)     Status: None   Collection Time: 07/24/19  7:25 AM   Specimen: BLOOD RIGHT HAND  Result Value Ref Range Status   Specimen Description   Final    BLOOD RIGHT HAND Performed at [facility], [address] [place], [place] [address]    Special Requests   Final    BOTTLES DRAWN AEROBIC ONLY Blood Culture results may not be optimal due to an inadequate volume of blood received in culture bottles   Culture   Final    NO GROWTH 5 DAYS Performed at [facility], [place] [address] [place], [place] [address]    Report Status 07/29/2019 FINAL  Final  SARS CORONAVIRUS 2 (TAT 6-24 HRS) Nasopharyngeal Nasopharyngeal Swab     Status: None   Collection Time: 07/24/19  7:33 AM   Specimen: Nasopharyngeal Swab  Result Value Ref Range Status   SARS Coronavirus 2 NEGATIVE NEGATIVE Final    Comment: (NOTE) SARS-CoV-2 target nucleic acids are NOT DETECTED. The SARS-CoV-2 RNA is generally detectable in upper and lower respiratory specimens during the acute phase of infection. Negative results do not preclude SARS-CoV-2 infection, do not rule out co-infections with other pathogens, and should not be used as the sole basis for treatment or other patient management decisions. Negative results must be combined with clinical observations, patient history, and epidemiological information. The expected result is Negative. Fact Sheet for Patients: SugarRoll.be Fact Sheet for Healthcare Providers: https://www.woods-mathews.com/ This test is not yet approved or cleared by the [place] FDA and  has been authorized for detection and/or diagnosis of SARS-CoV-2  by FDA under an Emergency Use Authorization (EUA). This EUA will remain  in effect (meaning this test can be used) for the duration of the COVID-19 declaration under Section 56 4(b)(1) of the Act, 21 U.S.C. section 360bbb-3(b)(1), unless the authorization is terminated or revoked sooner. Performed at [facility], [place] [address] [place], [place] [address]   MRSA PCR Screening     Status: Abnormal   Collection Time: 07/24/19 11:17 AM   Specimen: Nasal Mucosa; Nasopharyngeal  Result Value Ref Range Status   MRSA by PCR POSITIVE (A) NEGATIVE Final    Comment:  The GeneXpert MRSA Assay (FDA approved for NASAL specimens only), is one component of a comprehensive MRSA colonization surveillance program. It is not intended to diagnose MRSA infection nor to guide or monitor treatment for MRSA infections. RESULT CALLED TO, READ BACK BY AND VERIFIED WITH: [place]ett [place] [id number] @ 1353 BY J SCOTTON Performed at [facility], [address] [place], [place] [address]     Radiology Studies: No results found.  35 minutes with more than 50% spent in reviewing records, counseling patient/family and coordinating care.  Chaundra Abreu T. Jozey Janco [facility]  If 7PM-7AM, please contact night-coverage [email or web address] Password [facility] 07/29/2019, 4:24 PM

## 2019-07-30 DIAGNOSIS — F329 Major depressive disorder, single episode, unspecified: Secondary | ICD-10-CM

## 2019-07-30 DIAGNOSIS — K529 Noninfective gastroenteritis and colitis, unspecified: Secondary | ICD-10-CM

## 2019-07-30 DIAGNOSIS — F1910 Other psychoactive substance abuse, uncomplicated: Secondary | ICD-10-CM

## 2019-07-30 DIAGNOSIS — G43009 Migraine without aura, not intractable, without status migrainosus: Secondary | ICD-10-CM

## 2019-07-30 MED ORDER — BUTALBITAL-APAP-CAFFEINE 50-325-40 MG PO TABS: 1.0000 | ORAL_TABLET | Freq: Four times a day (QID) | ORAL | Status: DC | PRN

## 2019-07-30 MED ORDER — DIPHENHYDRAMINE HCL 50 MG/ML IJ SOLN
25.0000 mg | Freq: Once | INTRAMUSCULAR | Status: AC
  Administered 2019-07-30: 25 mg via INTRAVENOUS
  Filled 2019-07-30: qty 1

## 2019-07-30 MED ORDER — METOCLOPRAMIDE HCL 5 MG/ML IJ SOLN
10.0000 mg | Freq: Once | INTRAMUSCULAR | Status: AC
  Administered 2019-07-30: 10:00:00 10 mg via INTRAVENOUS
  Filled 2019-07-30: qty 2

## 2019-07-30 MED ORDER — AMOXICILLIN-POT CLAVULANATE 875-125 MG PO TABS
1.0000 | ORAL_TABLET | Freq: Two times a day (BID) | ORAL | Status: DC
  Administered 2019-07-30 – 2019-07-31 (×3): 1 via ORAL
  Filled 2019-07-30 (×3): qty 1

## 2019-07-30 MED ORDER — DEXAMETHASONE SODIUM PHOSPHATE 10 MG/ML IJ SOLN
10.0000 mg | Freq: Once | INTRAMUSCULAR | Status: AC
  Administered 2019-07-30: 10:00:00 10 mg via INTRAVENOUS
  Filled 2019-07-30: qty 1

## 2019-07-30 NOTE — Progress Notes (Signed)
PROGRESS NOTE  Colleen Ramirez IWL:[id number] DOB: 10-21-1980   PCP: Patient, No Pcp Per  Patient is from: Home  DOA: 07/24/2019 LOS: 6  Brief Narrative / Interim history: 38 year old female with history of gastric bypass 7 years ago and-year-old, iron deficiency anemia, polysubstance use and depression presenting with nausea, vomiting and abdominal pain and admitted for sepsis due to possible enteritis.  CT abdomen and pelvis revealed regional enteritis concerning for possible infection or inflammation.  Neurosurgery and gastroenterology consulted and recommended medical management with IV antibiotics, IV fluid and adjustment of diet consistency.   Subjective: No major events overnight of this morning.  Complains of migraine headache this morning.  Has history of migraine headache.  She is not able to describe the nature of her pain.  She says her pain is severe, 10/10.  Reports photophobia but no phonophobia.  Denies vision change or focal neuro symptoms such as weakness, numbness or tingling.  GI symptoms improved.  Denies abdominal pain and emesis but continues to endorse nausea.  Has not eaten this morning yet.  Likes to try soft diet.  Objective: Vitals:   07/28/19 2044 07/29/19 0456 07/29/19 1426 07/29/19 2049  BP: 133/87 134/86 (!) 128/93 (!) 130/99  Pulse: 63 60 95 73  Resp: 17 17 18 20   Temp:  99.1 F (37.3 C) 98.8 F (37.1 C) 98.4 F (36.9 C)  TempSrc: Oral Oral Oral Oral  SpO2: 100% 100% 100% 98%  Weight:      Height:        Intake/Output Summary (Last 24 hours) at 07/30/2019 1340 Last data filed at 07/30/2019 1740 Gross per 24 hour  Intake 1652.27 ml  Output -  Net 1652.27 ml   Filed Weights   07/24/19 0444 07/25/19 0400  Weight: 54.4 kg 56.5 kg    Examination:   GENERAL: No acute distress.  Appears well.  HEENT: MMM.  Vision and hearing grossly intact.  Covering her eyes. NECK: Supple.  No apparent JVD.  RESP:  No IWOB. Good air movement bilaterally.  CVS:  RRR. Heart sounds normal.  ABD/GI/GU: Bowel sounds present. Soft. Non tender.  MSK/EXT:  Moves extremities. No apparent deformity or edema.  SKIN: no apparent skin lesion or wound NEURO: Awake, alert and oriented appropriately.  Cranial, motor, sensory and reflex intact. PSYCH: Flat affect   Assessment & Plan: Sepsis secondary to regional enteritis/possible SBO: Sepsis physiology resolved. -CT abdomen and pelvis revealed regional enteritis/possible SBO. -Patient is having bowel movements.  Abdominal pain improved. -Rocephin and IV Flagyl-11/6-11/12.  Augmentin 11/12>procalcitonin downtrending.  LA and WBC resolved. -Appreciate GI and GS-conservative care with antibiotics, IV fluid, pain control and diet. -Advance diet to soft -Probiotics  Iron deficiency anemia along with folate deficiency: Unknown baseline..  Anemia panel consistent with iron deficiency, saturation ratio of 2% and folate deficiency. -Hgb 11.1 (admit)> 9.7.  MCV 84. -Received IV Feraheme 11/11 -Continue p.o. ferrous sulfate and folic acid  Migraine headache -Dexamethasone 10 mg IV, Reglan and Benadryl once -As needed Fioricet  Polysubstance use: UDS positive for cocaine, opiate and amphetamine (has Rx) -Encouraged cessation  Social issues/homelessness -CSW consulted.  ADD/anxiety/insomnia -Continue home Adderall, trazodone and Valium.     Nutrition Problem: Inadequate oral intake Etiology: nausea, vomiting, diarrhea  Signs/Symptoms: per patient/family report  Interventions: Ensure Enlive (each supplement provides 350kcal and 20 grams of protein), MVI   DVT prophylaxis: Subcu Lovenox Code Status: Full code Family Communication: Patient and/or RN. Available if any question.  Disposition Plan: Remains inpatient pending  improvement in pain and food tolerance.  Final disposition likely shelter.  Resource provided by CSW. Consultants: General surgery and GI  Procedures:  None  Microbiology  summarized: 11/6-COVID-19 negative 11/6-blood cultures negative  Sch Meds:  Scheduled Meds: . amoxicillin-clavulanate  1 tablet Oral Q12H  . amphetamine-dextroamphetamine  20 mg Oral Q24H  . amphetamine-dextroamphetamine  40 mg Oral BH-q7a  . enoxaparin (LOVENOX) injection  40 mg Subcutaneous Q24H  . feeding supplement (ENSURE ENLIVE)  237 mL Oral BID BM  . ferrous sulfate  325 mg Oral Q breakfast  . folic acid  1 mg Oral Daily  . multivitamin with minerals  1 tablet Oral Daily  . thiamine  100 mg Oral Daily   Continuous Infusions: . sodium chloride Stopped (07/30/19 0305)   PRN Meds:.acetaminophen **OR** acetaminophen, alum & mag hydroxide-simeth, diazepam, fentaNYL (SUBLIMAZE) injection, ondansetron **OR** ondansetron (ZOFRAN) IV, traMADol, traZODone  Antimicrobials: Anti-infectives (From admission, onward)   Start     Dose/Rate Route Frequency Ordered Stop   07/30/19 1000  amoxicillin-clavulanate (AUGMENTIN) 875-125 MG per tablet 1 tablet     1 tablet Oral Every 12 hours 07/30/19 0857     07/24/19 1200  cefTRIAXone (ROCEPHIN) 2 g in sodium chloride 0.9 % 100 mL IVPB  Status:  Discontinued     2 g 200 mL/hr over 30 Minutes Intravenous Every 24 hours 07/24/19 1114 07/30/19 0857   07/24/19 1200  metroNIDAZOLE (FLAGYL) IVPB 500 mg  Status:  Discontinued     500 mg 100 mL/hr over 60 Minutes Intravenous Every 8 hours 07/24/19 1114 07/30/19 0857   07/24/19 0730  piperacillin-tazobactam (ZOSYN) IVPB 3.375 g     3.375 g 100 mL/hr over 30 Minutes Intravenous  Once 07/24/19 0719 07/24/19 0814   07/24/19 0730  vancomycin (VANCOCIN) IVPB 1000 mg/200 mL premix     1,000 mg 200 mL/hr over 60 Minutes Intravenous  Once 07/24/19 0719 07/24/19 0921       I have personally reviewed the following labs and images: CBC: Recent Labs  Lab 07/24/19 0503 07/25/19 0242 07/26/19 0256 07/27/19 0839  WBC 17.7* 20.2* 15.6* 8.1  NEUTROABS 16.3*  --  12.4* 5.3  HGB 11.1* 10.5* 9.2* 9.7*  HCT  35.6* 34.3* 30.2* 31.4*  MCV 83.4 84.7 84.4 83.3  PLT 366 277 303 285   BMP &GFR Recent Labs  Lab 07/24/19 0503 07/25/19 0242 07/26/19 0256 07/27/19 0839  NA 135 136 137 137  K 4.0 3.9 3.7 3.7  CL 100 104 107 108  CO2 25 23 22 23   GLUCOSE 126* 97 103* 94  BUN 17 18 22* 12  CREATININE 0.96 0.85 0.88 0.63  CALCIUM 9.1 8.6* 8.3* 8.1*   Estimated Creatinine Clearance: 79.9 mL/min (by C-G formula based on SCr of 0.63 mg/dL). Liver & Pancreas: Recent Labs  Lab 07/24/19 0503 07/25/19 0242  AST 19 18  ALT 17 15  ALKPHOS 60 57  BILITOT 0.7 0.5  PROT 7.2 5.8*  ALBUMIN 3.5 2.6*   Recent Labs  Lab 07/24/19 0503  LIPASE 19   No results for input(s): AMMONIA in the last 168 hours. Diabetic: No results for input(s): HGBA1C in the last 72 hours. No results for input(s): GLUCAP in the last 168 hours. Cardiac Enzymes: No results for input(s): CKTOTAL, CKMB, CKMBINDEX, TROPONINI in the last 168 hours. No results for input(s): PROBNP in the last 8760 hours. Coagulation Profile: Recent Labs  Lab 07/25/19 0242  INR 1.2   Thyroid Function Tests: No results for input(s):  TSH, T4TOTAL, FREET4, T3FREE, THYROIDAB in the last 72 hours. Lipid Profile: No results for input(s): CHOL, HDL, LDLCALC, TRIG, CHOLHDL, LDLDIRECT in the last 72 hours. Anemia Panel: No results for input(s): VITAMINB12, FOLATE, FERRITIN, TIBC, IRON, RETICCTPCT in the last 72 hours. Urine analysis:    Component Value Date/Time   COLORURINE YELLOW 07/24/2019 0637   APPEARANCEUR HAZY (A) 07/24/2019 0637   LABSPEC >1.030 (H) 07/24/2019 0637   PHURINE 6.0 07/24/2019 0637   GLUCOSEU NEGATIVE 07/24/2019 0637   HGBUR NEGATIVE 07/24/2019 0637   BILIRUBINUR NEGATIVE 07/24/2019 0637   KETONESUR NEGATIVE 07/24/2019 0637   PROTEINUR 30 (A) 07/24/2019 0637   NITRITE NEGATIVE 07/24/2019 0637   LEUKOCYTESUR NEGATIVE 07/24/2019 0637   Sepsis Labs: Invalid input(s): PROCALCITONIN, LACTICIDVEN  Microbiology: Recent  Results (from the past 240 hour(s))  Blood culture (routine x 2)     Status: None   Collection Time: 07/24/19  6:45 AM   Specimen: BLOOD  Result Value Ref Range Status   Specimen Description   Final    BLOOD RIGHT ANTECUBITAL Performed at [facility] [place], [address] [place], [place] [address]    Special Requests   Final    BOTTLES DRAWN AEROBIC AND ANAEROBIC Blood Culture adequate volume Performed at [facility], [address] [place], [place] [address]    Culture   Final    NO GROWTH 5 DAYS Performed at [facility], [address] [place], [place] [address]    Report Status 07/29/2019 FINAL  Final  Blood culture (routine x 2)     Status: None   Collection Time: 07/24/19  7:25 AM   Specimen: BLOOD RIGHT HAND  Result Value Ref Range Status   Specimen Description   Final    BLOOD RIGHT HAND Performed at [facility] [place], [address] [place], [place] [address]    Special Requests   Final    BOTTLES DRAWN AEROBIC ONLY Blood Culture results may not be optimal due to an inadequate volume of blood received in culture bottles   Culture   Final    NO GROWTH 5 DAYS Performed at [facility], [address] [place], [place] [address]    Report Status 07/29/2019 FINAL  Final  SARS CORONAVIRUS 2 (TAT 6-24 HRS) Nasopharyngeal Nasopharyngeal Swab     Status: None   Collection Time: 07/24/19  7:33 AM   Specimen: Nasopharyngeal Swab  Result Value Ref Range Status   SARS Coronavirus 2 NEGATIVE NEGATIVE Final    Comment: (NOTE) SARS-CoV-2 target nucleic acids are NOT DETECTED. The SARS-CoV-2 RNA is generally detectable in upper and lower respiratory specimens during the acute phase of infection. Negative results do not preclude SARS-CoV-2 infection, do not rule out co-infections with other pathogens, and should not be used as the sole basis for treatment or other patient management decisions. Negative results must be combined with  clinical observations, patient history, and epidemiological information. The expected result is Negative. Fact Sheet for Patients: HairSlick.nohttps://www.fda.gov/media/138098/download Fact Sheet for Healthcare Providers: quierodirigir.comhttps://www.fda.gov/media/138095/download This test is not yet approved or cleared by the [place] FDA and  has been authorized for detection and/or diagnosis of SARS-CoV-2 by FDA under an Emergency Use Authorization (EUA). This EUA will remain  in effect (meaning this test can be used) for the duration of the COVID-19 declaration under Section 56 4(b)(1) of the Act, 21 U.S.C. section 360bbb-3(b)(1), unless the authorization is terminated or revoked sooner. Performed at  [facility], [address], [place], [place] [address]   MRSA PCR Screening     Status: Abnormal   Collection Time: 07/24/19 11:17 AM   Specimen: Nasal Mucosa; Nasopharyngeal  Result Value Ref Range Status   MRSA by PCR POSITIVE (A) NEGATIVE Final    Comment:        The GeneXpert MRSA Assay (FDA approved for NASAL specimens only), is one component of a comprehensive MRSA colonization surveillance program. It is not intended to diagnose MRSA infection nor to guide or monitor treatment for MRSA infections. RESULT CALLED TO, READ BACK BY AND VERIFIED WITH: [place]ett [place] [id number] @ 1353 BY J SCOTTON Performed at [facility], [address] [place], [place] [address]     Radiology Studies: No results found.   T.  [facility]  If 7PM-7AM, please contact night-coverage [email or web address] Password [email or web address] 07/30/2019, 1:40 PM

## 2019-07-30 NOTE — Progress Notes (Signed)
Pt does not want I.V. at this time,would like to finish her nap. RN aware. Will notify me when patient is ready.

## 2019-07-31 DIAGNOSIS — E538 Deficiency of other specified B group vitamins: Secondary | ICD-10-CM

## 2019-07-31 DIAGNOSIS — D508 Other iron deficiency anemias: Secondary | ICD-10-CM

## 2019-07-31 MED ORDER — ADULT MULTIVITAMIN W/MINERALS CH: 1.0000 | ORAL_TABLET | Freq: Every day | ORAL | 0 refills | Status: DC

## 2019-07-31 MED ORDER — FERROUS SULFATE 325 (65 FE) MG PO TABS: 325.0000 mg | ORAL_TABLET | Freq: Every day | ORAL | 0 refills | Status: DC

## 2019-07-31 MED ORDER — AMOXICILLIN-POT CLAVULANATE 875-125 MG PO TABS: 1.0000 | ORAL_TABLET | Freq: Two times a day (BID) | ORAL | 0 refills | Status: DC

## 2019-07-31 MED ORDER — FOLIC ACID 1 MG PO TABS: 1.0000 mg | ORAL_TABLET | Freq: Every day | ORAL | 1 refills | Status: DC

## 2019-07-31 MED ORDER — SACCHAROMYCES BOULARDII 250 MG PO CAPS: 250.0000 mg | ORAL_CAPSULE | Freq: Two times a day (BID) | ORAL | 0 refills | Status: DC

## 2019-07-31 NOTE — Progress Notes (Signed)
Reviewed discharge paperwork with patient, went over suggested follow up appointments and medication regimen. Provided coupons for Augmentin. Patient walked down to main entrance in the care of her friend.

## 2019-07-31 NOTE — Progress Notes (Signed)
PT Cancellation Note  Patient Details Name: Colleen Ramirez MRN: [id number] DOB: 1981-03-08   Cancelled Treatment:    Reason Eval/Treat Not Completed: Patient declined, no reason specified patient asleep but easily woken, declines PT as she did not sleep last night. Willing to participate later in AM. Will return later this morning.    Windell Norfolk, DPT, CBIS  Supplemental Physical Therapist [facility]    Pager [phone] Acute Rehab Office [phone]

## 2019-07-31 NOTE — Discharge Summary (Signed)
Physician Discharge Summary  Colleen GladeFAO:[id number] DOB: Oct 28, 1980 DOA: 07/24/2019  PCP: Patient, No Pcp Per  Admit date: 07/24/2019 Discharge date: 07/31/2019  Admitted From: Home Disposition: Home  Recommendations for Outpatient Follow-up:  1. Follow up with PCP in 1-2 weeks 2. Please obtain CBC/BMP/Mag at follow up 3. Please follow up on the following pending results: None  Home Health: None Equipment/Devices: None  Discharge Condition: Stable CODE STATUS: Full code  Hospital Course: 38 year old female with history of gastric bypass 7 years ago and-year-old, iron deficiency anemia, polysubstance use and depression presenting with nausea, vomiting and abdominal pain and admitted for sepsis due to possible enteritis.  CT abdomen and pelvis revealed regional enteritis concerning for possible infection or inflammation.    General surgery and gastroenterology consulted and recommended medical management with IV antibiotics, IV fluid and adjustment of diet consistency.  Patient was treated with IV ceftriaxone and Flagyl and transition to Augmentin to complete treatment.  Patient tolerated soft diet without problem prior to discharge.  She was discharged on Augmentin to complete antibiotic course.  Encouraged to establish care with PCP and follow-up in 1 to 2 weeks.   Patient is homeless.  CSW consulted and provided patient with housing and rehab resource.   See individual problem list below for more detail.  Discharge Diagnoses:  Sepsis secondary to regional enteritis/possible SBO: Sepsis physiology resolved. -CT abdomen and pelvis revealed regional enteritis/possible SBO. -GI and general surgery recommended conservative care -Patient is having regular bowel movements.  GI symptoms resolved. -Rocephin and IV Flagyl-11/6-11/12.  Augmentin 11/12>11/15   -LA and WBC resolved.  Procalcitonin down trended significantly 8 -Probiotics  Iron deficiency anemia along with folate  deficiency: Unknown baseline..  Anemia panel consistent with iron deficiency, saturation ratio of 2% and folate deficiency.  Received Feraheme. -Hgb 11.1 (admit)> 9.7.  MCV 84. -Discharged on multivitamin, ferrous sulfate and folic acid  Migraine headache: Resolved with dexamethasone, Reglan and Benadryl x1  Polysubstance use: UDS positive for cocaine, opiate and amphetamine (has Rx) -Encouraged cessation.  Provided with rehab resources  Social issues/homelessness -CSW consulted and and provided patient with housing sources.  ADD/anxiety/insomnia -Discharged home medications  Nutrition Problem: Inadequate oral intake -Per intake improved.  GI symptoms resolved. -Multivitamins   Discharge Instructions  Discharge Instructions    Call MD for:  persistant dizziness or light-headedness   Complete by: As directed    Call MD for:  persistant nausea and vomiting   Complete by: As directed    Call MD for:  severe uncontrolled pain   Complete by: As directed    Call MD for:  temperature >100.4   Complete by: As directed    Diet - low sodium heart healthy   Complete by: As directed    Discharge instructions   Complete by: As directed    It has been a pleasure taking care of you! You were hospitalized with bowel infection that has been treated with antibiotics. We are discharging you on more antibiotics to complete treatment course. Please review your new medication list and the directions before you take your medications. We recommend follow up with primary care doctor in one to two weeks.  Take care,   Increase activity slowly   Complete by: As directed      Allergies as of 07/31/2019   No Known Allergies     Medication List    TAKE these medications   amoxicillin-clavulanate 875-125 MG tablet Commonly known as: AUGMENTIN Take 1 tablet by mouth  every 12 (twelve) hours.   amphetamine-dextroamphetamine 20 MG tablet Commonly known as: ADDERALL Take 20-40 mg by mouth  2 (two) times daily. Take 2 tablets (40mg ) in the morning and take 1 tablet (20mg ) in the afternoon   ferrous sulfate 325 (65 FE) MG tablet Take 1 tablet (325 mg total) by mouth daily with breakfast.   folic acid 1 MG tablet Commonly known as: FOLVITE Take 1 tablet (1 mg total) by mouth daily.   multivitamin with minerals Tabs tablet Take 1 tablet by mouth daily.   saccharomyces boulardii 250 MG capsule Commonly known as: Florastor Take 1 capsule (250 mg total) by mouth 2 (two) times daily.       Consultations:  Gastroenterology  General surgery   Procedures/Studies:  2D Echo: None  Dg Abd 1 View  Result Date: 07/25/2019 CLINICAL DATA:  Enteritis and small-bowel obstruction EXAM: ABDOMEN - 1 VIEW COMPARISON:  CT abdomen pelvis 07/24/2019 FINDINGS: Lung bases are clear. Monitoring leads overlie the patient. Normal heart size. Contrast material within the urinary bladder. Gas is demonstrated within nondilated loops of small bowel throughout the abdomen. Supine evaluation limited for free intraperitoneal air detection. IMPRESSION: Scattered nondilated loops of small bowel within the abdomen. Electronically Signed   By: 13/03/2019 M.D.   On: 07/25/2019 09:29   Ct Abdomen Pelvis W Contrast  Result Date: 07/24/2019 CLINICAL DATA:  Two days of abdominal pain. IV drug abuser. Patient was uncooperative and combative as had to be scanned in the decubital position. EXAM: CT ABDOMEN AND PELVIS WITH CONTRAST TECHNIQUE: Multidetector CT imaging of the abdomen and pelvis was performed using the standard protocol following bolus administration of intravenous contrast. CONTRAST:  13/03/2019 OMNIPAQUE IOHEXOL 300 MG/ML  SOLN COMPARISON:  None. FINDINGS: Suboptimal positioning as described above in this combative uncooperative patient. Lower chest: Lung bases are normal. Hepatobiliary: 2 adjacent gallstones present with the larger measuring 6-7 mm. Liver and biliary tree otherwise unremarkable. Pancreas:  Unremarkable. Spleen: Normal. Adrenals/Urinary Tract: Adrenal glands are normal. Kidneys are normal in size without hydronephrosis or nephrolithiasis. Ureters and bladder are not well visualized. Stomach/Bowel: Multiple surgical sutures over the stomach small amount of contrast over the jejunum. Suggestion of gastrojejunostomy which is patent. Findings suggesting mild thickened irregular folds and wall thickening involving several more distal jejunal loops over the mid to lower abdomen. Is very difficult to evaluate remainder of the small bowel and colon due to lack of contrast and sparse peritoneal fat. Mild dilatation of a single small bowel loop over the midline upper abdomen measuring 3.1 cm in diameter. There is a surgical suture line over a stool containing bowel loop in the anterior midline abdomen just right of midline as this may represent a small bowel loop versus loop of colon. Most of the colon is air and stool-filled without dilatation. Rectosigmoid colon is difficult to identify. Appendix not visualized. No evidence of free peritoneal air. Vascular/Lymphatic: Minimal calcified plaque over the abdominal aorta. No adenopathy. Reproductive: Uterus is normal.  Ovaries are not well visualized. Other: No significant free fluid. Musculoskeletal: No acute findings. IMPRESSION: 1. Previous gastric bypass with suggestion of gastrojejunostomy which is patent. Suture line over a bowel loop in the right anterior mid abdomen. Several small bowel loops with irregular fold thickening/wall thickening with single dilated small bowel loop measuring 3.1 cm. Findings are likely due to a regional enteritis of infectious or inflammatory nature. There may be a degree of small-bowel obstruction versus ileus. Bowel is very difficult to evaluate due  to only a small amount of contrast in the proximal small bowel and sparse peritoneal fat. 2.  Cholelithiasis. 3.  Aortic Atherosclerosis (ICD10-I70.0). Electronically Signed   By:  Elberta Fortisaniel  Boyle M.D.   On: 07/24/2019 07:55   Dg Abd 2 Views  Result Date: 07/26/2019 CLINICAL DATA:  Right-sided abdominal pain recently.  Vomiting. EXAM: ABDOMEN - 2 VIEW COMPARISON:  CT abdomen 07/24/2019, KUB 07/25/2019 FINDINGS: There are scattered areas of small bowel and colonic air within the abdomen with a few small bowel air-fluid level centrally. Postsurgical changes in the epigastric region and left mid abdomen. There is no evidence of pneumoperitoneum, portal venous gas or pneumatosis. There are no pathologic calcifications along the expected course of the ureters. The osseous structures are unremarkable. IMPRESSION: No significant overall change compared with the prior exam. Scattered loops of small bowel and colonic air with small bowel air-fluid levels in the mid abdomen. This appearance can be seen with enteritis versus a partial small bowel obstruction Electronically Signed   By: Elige KoHetal  Patel   On: 07/26/2019 15:12       Discharge Exam: Vitals:   07/30/19 1624 07/30/19 2231  BP: (!) 126/92 (!) 129/95  Pulse: 99 81  Resp: 16 18  Temp: 98.7 F (37.1 C) 98.2 F (36.8 C)  SpO2: 99% 99%    GENERAL: No acute distress.  Appears well.  HEENT: MMM.  Vision and hearing grossly intact.  NECK: Supple.  No apparent JVD.  RESP:  No IWOB. Good air movement bilaterally. CVS:  RRR. Heart sounds normal.  ABD/GI/GU: Bowel sounds present. Soft. Non tender.  MSK/EXT:  Moves extremities. No apparent deformity or edema.  SKIN: no apparent skin lesion or wound NEURO: Awake, alert and oriented appropriately.  No gross deficit.  PSYCH: Calm. Normal affect.   The results of significant diagnostics from this hospitalization (including imaging, microbiology, ancillary and laboratory) are listed below for reference.     Microbiology: Recent Results (from the past 240 hour(s))  Blood culture (routine x 2)     Status: None   Collection Time: 07/24/19  6:45 AM   Specimen: BLOOD  Result Value  Ref Range Status   Specimen Description   Final    BLOOD RIGHT ANTECUBITAL Performed at [facility] [place], [address] [place], [place] [address]    Special Requests   Final    BOTTLES DRAWN AEROBIC AND ANAEROBIC Blood Culture adequate volume Performed at [facility] [place], [address] [place], [place] [address]    Culture   Final    NO GROWTH 5 DAYS Performed at [facility], [address] [place], [place] [address]    Report Status 07/29/2019 FINAL  Final  Blood culture (routine x 2)     Status: None   Collection Time: 07/24/19  7:25 AM   Specimen: BLOOD RIGHT HAND  Result Value Ref Range Status   Specimen Description   Final    BLOOD RIGHT HAND Performed at [facility] [place], [address] [place], [place] [address]    Special Requests   Final    BOTTLES DRAWN AEROBIC ONLY Blood Culture results may not be optimal due to an inadequate volume of blood received in culture bottles   Culture   Final    NO GROWTH 5 DAYS Performed at [facility], [address] [place], [place] [address]    Report Status 07/29/2019 FINAL  Final  SARS CORONAVIRUS 2 (  TAT 6-24 HRS) Nasopharyngeal Nasopharyngeal Swab     Status: None   Collection Time: 07/24/19  7:33 AM   Specimen: Nasopharyngeal Swab  Result Value Ref Range Status   SARS Coronavirus 2 NEGATIVE NEGATIVE Final    Comment: (NOTE) SARS-CoV-2 target nucleic acids are NOT DETECTED. The SARS-CoV-2 RNA is generally detectable in upper and lower respiratory specimens during the acute phase of infection. Negative results do not preclude SARS-CoV-2 infection, do not rule out co-infections with other pathogens, and should not be used as the sole basis for treatment or other patient management decisions. Negative results must be combined with clinical observations, patient history, and epidemiological information. The expected result is Negative. Fact Sheet for  Patients: HairSlick.no Fact Sheet for Healthcare Providers: quierodirigir.com This test is not yet approved or cleared by the [place] FDA and  has been authorized for detection and/or diagnosis of SARS-CoV-2 by FDA under an Emergency Use Authorization (EUA). This EUA will remain  in effect (meaning this test can be used) for the duration of the COVID-19 declaration under Section 56 4(b)(1) of the Act, 21 U.S.C. section 360bbb-3(b)(1), unless the authorization is terminated or revoked sooner. Performed at [facility], [address] [place], [place] [address]   MRSA PCR Screening     Status: Abnormal   Collection Time: 07/24/19 11:17 AM   Specimen: Nasal Mucosa; Nasopharyngeal  Result Value Ref Range Status   MRSA by PCR POSITIVE (A) NEGATIVE Final    Comment:        The GeneXpert MRSA Assay (FDA approved for NASAL specimens only), is one component of a comprehensive MRSA colonization surveillance program. It is not intended to diagnose MRSA infection nor to guide or monitor treatment for MRSA infections. RESULT CALLED TO, READ BACK BY AND VERIFIED WITH: [place] [id number] @ 1353 BY J SCOTTON Performed at [facility], [address], [place], [place] [address]      Labs: BNP (last 3 results) No results for input(s): BNP in the last 8760 hours. Basic Metabolic Panel: Recent Labs  Lab 07/25/19 0242 07/26/19 0256 07/27/19 0839  NA 136 137 137  K 3.9 3.7 3.7  CL 104 107 108  CO2 GLUCOSE 97 103* 94  BUN 18 22* 12  CREATININE 0.85 0.88 0.63  CALCIUM 8.6* 8.3* 8.1*   Liver Function Tests: Recent Labs  Lab 07/25/19 0242  AST 18  ALT 15  ALKPHOS 57  BILITOT 0.5  PROT 5.8*  ALBUMIN 2.6*   No results for input(s): LIPASE, AMYLASE in the last 168 hours. No results for input(s): AMMONIA in the last 168 hours. CBC: Recent Labs  Lab 07/25/19 0242 07/26/19 0256  07/27/19 0839  WBC 20.2* 15.6* 8.1  NEUTROABS  --  12.4* 5.3  HGB 10.5* 9.2* 9.7*  HCT 34.3* 30.2* 31.4*  MCV 84.7 84.4 83.3  PLT 277 303 285   Cardiac Enzymes: No results for input(s): CKTOTAL, CKMB, CKMBINDEX, TROPONINI in the last 168 hours. BNP: Invalid input(s): POCBNP CBG: No results for input(s): GLUCAP in the last 168 hours. D-Dimer No results for input(s): DDIMER in the last 72 hours. Hgb A1c No results for input(s): HGBA1C in the last 72 hours. Lipid Profile No results for input(s): CHOL, HDL, LDLCALC, TRIG, CHOLHDL, LDLDIRECT in the last 72 hours. Thyroid function studies No results for input(s): TSH, T4TOTAL, T3FREE, THYROIDAB in the last 72 hours.  Invalid input(s): FREET3 Anemia work up No results for input(s): VITAMINB12,  FOLATE, FERRITIN, TIBC, IRON, RETICCTPCT in the last 72 hours. Urinalysis    Component Value Date/Time   COLORURINE YELLOW 07/24/2019 0637   APPEARANCEUR HAZY (A) 07/24/2019 0637   LABSPEC >1.030 (H) 07/24/2019 0637   PHURINE 6.0 07/24/2019 [place] 07/24/2019 0637   HGBUR NEGATIVE 07/24/2019 0637   BILIRUBINUR NEGATIVE 07/24/2019 0637   KETONESUR NEGATIVE 07/24/2019 0637   PROTEINUR 30 (A) 07/24/2019 0637   NITRITE NEGATIVE 07/24/2019 0637   LEUKOCYTESUR NEGATIVE 07/24/2019 0637   Sepsis Labs Invalid input(s): PROCALCITONIN,  WBC,  LACTICIDVEN   Time coordinating discharge: 35 minutes  SIGNED:  Mercy Riding, MD  [facility] 07/31/2019, 7:58 AM  If 7PM-7AM, please contact night-coverage [email or web address] Password [email or web address]

## 2019-07-31 NOTE — TOC Transition Note (Signed)
Transition of Care [facility]) - CM/SW Discharge Note   Patient Details  Name: Colleen Ramirez MRN: [id number] Date of Birth: 11/13/1980  Transition of Care [facility]) CM/SW Contact:  Lynnell Catalan, RN Phone Number: 07/31/2019, 10:12 AM   Clinical Narrative:     Pt provided with Goodrx coupon for Augmentin. Pt states she can ask her son for the $10.    Barriers to Discharge: Continued Medical Work up

## 2019-08-18 ENCOUNTER — Encounter (HOSPITAL_COMMUNITY): Payer: Self-pay | Admitting: Emergency Medicine

## 2019-08-18 ENCOUNTER — Other Ambulatory Visit: Payer: Self-pay

## 2019-08-18 ENCOUNTER — Inpatient Hospital Stay (HOSPITAL_COMMUNITY)
Admission: EM | Admit: 2019-08-18 | Discharge: 2019-08-25 | DRG: 603 | Disposition: A | Discharge status: Home / Self Care | Payer: Self-pay | Attending: Internal Medicine | Admitting: Internal Medicine

## 2019-08-18 ENCOUNTER — Emergency Department (HOSPITAL_COMMUNITY): Payer: Self-pay

## 2019-08-18 DIAGNOSIS — F17210 Nicotine dependence, cigarettes, uncomplicated: Secondary | ICD-10-CM | POA: Diagnosis present

## 2019-08-18 DIAGNOSIS — F410 Panic disorder [episodic paroxysmal anxiety] without agoraphobia: Secondary | ICD-10-CM | POA: Diagnosis present

## 2019-08-18 DIAGNOSIS — L03211 Cellulitis of face: Principal | ICD-10-CM

## 2019-08-18 DIAGNOSIS — R200 Anesthesia of skin: Secondary | ICD-10-CM | POA: Diagnosis present

## 2019-08-18 DIAGNOSIS — D649 Anemia, unspecified: Secondary | ICD-10-CM | POA: Diagnosis present

## 2019-08-18 DIAGNOSIS — Z79899 Other long term (current) drug therapy: Secondary | ICD-10-CM

## 2019-08-18 DIAGNOSIS — L719 Rosacea, unspecified: Secondary | ICD-10-CM | POA: Diagnosis present

## 2019-08-18 DIAGNOSIS — F4321 Adjustment disorder with depressed mood: Secondary | ICD-10-CM | POA: Diagnosis present

## 2019-08-18 DIAGNOSIS — L0390 Cellulitis, unspecified: Secondary | ICD-10-CM | POA: Diagnosis present

## 2019-08-18 DIAGNOSIS — K029 Dental caries, unspecified: Secondary | ICD-10-CM | POA: Diagnosis present

## 2019-08-18 DIAGNOSIS — K047 Periapical abscess without sinus: Secondary | ICD-10-CM | POA: Diagnosis present

## 2019-08-18 DIAGNOSIS — Z20828 Contact with and (suspected) exposure to other viral communicable diseases: Secondary | ICD-10-CM | POA: Diagnosis present

## 2019-08-18 DIAGNOSIS — F329 Major depressive disorder, single episode, unspecified: Secondary | ICD-10-CM | POA: Diagnosis present

## 2019-08-18 DIAGNOSIS — F1510 Other stimulant abuse, uncomplicated: Secondary | ICD-10-CM | POA: Diagnosis present

## 2019-08-18 DIAGNOSIS — Z818 Family history of other mental and behavioral disorders: Secondary | ICD-10-CM

## 2019-08-18 DIAGNOSIS — F1910 Other psychoactive substance abuse, uncomplicated: Secondary | ICD-10-CM | POA: Diagnosis present

## 2019-08-18 DIAGNOSIS — Z590 Homelessness: Secondary | ICD-10-CM

## 2019-08-18 DIAGNOSIS — R21 Rash and other nonspecific skin eruption: Secondary | ICD-10-CM | POA: Diagnosis present

## 2019-08-18 DIAGNOSIS — F32A Depression, unspecified: Secondary | ICD-10-CM | POA: Diagnosis present

## 2019-08-18 DIAGNOSIS — F419 Anxiety disorder, unspecified: Secondary | ICD-10-CM | POA: Diagnosis present

## 2019-08-18 DIAGNOSIS — Z9884 Bariatric surgery status: Secondary | ICD-10-CM

## 2019-08-18 DIAGNOSIS — F4310 Post-traumatic stress disorder, unspecified: Secondary | ICD-10-CM | POA: Diagnosis present

## 2019-08-18 LAB — CBC WITH DIFFERENTIAL/PLATELET
Abs Immature Granulocytes: 0.08 10*3/uL — ABNORMAL HIGH (ref 0.00–0.07)
Basophils Absolute: 0.1 10*3/uL (ref 0.0–0.1)
Basophils Relative: 0 %
Eosinophils Absolute: 0.1 10*3/uL (ref 0.0–0.5)
Eosinophils Relative: 1 %
HCT: 36.4 % (ref 36.0–46.0)
Hemoglobin: 11.4 g/dL — ABNORMAL LOW (ref 12.0–15.0)
Immature Granulocytes: 1 %
Lymphocytes Relative: 11 %
Lymphs Abs: 1.8 10*3/uL (ref 0.7–4.0)
MCH: 27.3 pg (ref 26.0–34.0)
MCHC: 31.3 g/dL (ref 30.0–36.0)
MCV: 87.3 fL (ref 80.0–100.0)
Monocytes Absolute: 1.4 10*3/uL — ABNORMAL HIGH (ref 0.1–1.0)
Monocytes Relative: 9 %
Neutro Abs: 12.4 10*3/uL — ABNORMAL HIGH (ref 1.7–7.7)
Neutrophils Relative %: 78 %
Platelets: 364 10*3/uL (ref 150–400)
RBC: 4.17 MIL/uL (ref 3.87–5.11)
RDW: 17.2 % — ABNORMAL HIGH (ref 11.5–15.5)
WBC: 15.8 10*3/uL — ABNORMAL HIGH (ref 4.0–10.5)
nRBC: 0 % (ref 0.0–0.2)

## 2019-08-18 LAB — BASIC METABOLIC PANEL
Anion gap: 11 (ref 5–15)
BUN: 11 mg/dL (ref 6–20)
CO2: 25 mmol/L (ref 22–32)
Calcium: 9.9 mg/dL (ref 8.9–10.3)
Chloride: 98 mmol/L (ref 98–111)
Creatinine, Ser: 0.75 mg/dL (ref 0.44–1.00)
GFR calc Af Amer: >60 mL/min (ref >60)
GFR calc non Af Amer: >60 mL/min (ref >60)
Glucose, Bld: 103 mg/dL — ABNORMAL HIGH (ref 70–99)
Potassium: 4.8 mmol/L (ref 3.5–5.1)
Sodium: 134 mmol/L — ABNORMAL LOW (ref 135–145)

## 2019-08-18 LAB — RAPID URINE DRUG SCREEN, HOSP PERFORMED
Amphetamines: POSITIVE — AB
Barbiturates: NOT DETECTED
Benzodiazepines: NOT DETECTED
Cocaine: NOT DETECTED
Opiates: NOT DETECTED
Tetrahydrocannabinol: NOT DETECTED

## 2019-08-18 LAB — I-STAT BETA HCG BLOOD, ED (MC, WL, AP ONLY): I-stat hCG, quantitative: <5 m[IU]/mL (ref <5)

## 2019-08-18 LAB — SARS CORONAVIRUS 2 (TAT 6-24 HRS): SARS Coronavirus 2: NEGATIVE

## 2019-08-18 LAB — C-REACTIVE PROTEIN: CRP: <0.8 mg/dL (ref <1.0)

## 2019-08-18 MED ORDER — ACETAMINOPHEN 325 MG PO TABS
650.0000 mg | ORAL_TABLET | Freq: Four times a day (QID) | ORAL | Status: DC | PRN
  Administered 2019-08-18 – 2019-08-19 (×2): 650 mg via ORAL
  Filled 2019-08-18 (×3): qty 2

## 2019-08-18 MED ORDER — ONDANSETRON HCL 4 MG/2ML IJ SOLN: 4.0000 mg | Freq: Four times a day (QID) | INTRAMUSCULAR | Status: DC | PRN

## 2019-08-18 MED ORDER — CLINDAMYCIN HCL 150 MG PO CAPS: 450.0000 mg | ORAL_CAPSULE | Freq: Three times a day (TID) | ORAL | 0 refills | Status: DC

## 2019-08-18 MED ORDER — DOCUSATE SODIUM 100 MG PO CAPS
100.0000 mg | ORAL_CAPSULE | Freq: Two times a day (BID) | ORAL | Status: DC
  Administered 2019-08-20: 10:00:00 100 mg via ORAL
  Filled 2019-08-18 (×5): qty 1

## 2019-08-18 MED ORDER — CLINDAMYCIN PHOSPHATE 600 MG/50ML IV SOLN
600.0000 mg | Freq: Once | INTRAVENOUS | Status: AC
  Administered 2019-08-18: 14:00:00 600 mg via INTRAVENOUS
  Filled 2019-08-18: qty 50

## 2019-08-18 MED ORDER — ONDANSETRON HCL 4 MG PO TABS: 4.0000 mg | ORAL_TABLET | Freq: Four times a day (QID) | ORAL | Status: DC | PRN

## 2019-08-18 MED ORDER — ACETAMINOPHEN 650 MG RE SUPP: 650.0000 mg | Freq: Four times a day (QID) | RECTAL | Status: DC | PRN

## 2019-08-18 MED ORDER — IOHEXOL 300 MG/ML  SOLN
75.0000 mL | Freq: Once | INTRAMUSCULAR | Status: AC | PRN
  Administered 2019-08-18: 10:00:00 75 mL via INTRAVENOUS

## 2019-08-18 MED ORDER — METRONIDAZOLE IN NACL 5-0.79 MG/ML-% IV SOLN
500.0000 mg | Freq: Three times a day (TID) | INTRAVENOUS | Status: DC
  Administered 2019-08-18 – 2019-08-19 (×4): 500 mg via INTRAVENOUS
  Filled 2019-08-18 (×4): qty 100

## 2019-08-18 MED ORDER — SODIUM CHLORIDE (PF) 0.9 % IJ SOLN
INTRAMUSCULAR | Status: AC
  Filled 2019-08-18: qty 50

## 2019-08-18 MED ORDER — SODIUM CHLORIDE 0.9 % IV SOLN
1.0000 g | Freq: Every day | INTRAVENOUS | Status: DC
  Administered 2019-08-18 – 2019-08-19 (×2): 1 g via INTRAVENOUS
  Filled 2019-08-18: qty 10
  Filled 2019-08-18 (×2): qty 1

## 2019-08-18 MED ORDER — HYDROMORPHONE HCL 1 MG/ML IJ SOLN
0.5000 mg | Freq: Once | INTRAMUSCULAR | Status: AC
  Administered 2019-08-18: 0.5 mg via INTRAVENOUS
  Filled 2019-08-18: qty 1

## 2019-08-18 MED ORDER — ENOXAPARIN SODIUM 40 MG/0.4ML ~~LOC~~ SOLN
40.0000 mg | SUBCUTANEOUS | Status: DC
  Filled 2019-08-18 (×4): qty 0.4

## 2019-08-18 NOTE — ED Triage Notes (Signed)
Per pt, states rash on her face-states sores are painful-states she thinks she has cellulitis on left eye lid-

## 2019-08-18 NOTE — Discharge Instructions (Signed)
Please take antibiotic as prescribed.  Please follow-up with your primary doctor later this week for recheck.  If you develop fever, worsening of your rash, difficulty with vision, any pain with eye movement, please return to ER for reassessment.  Recommend warm compresses 3 times daily to affected area.

## 2019-08-18 NOTE — ED Provider Notes (Signed)
[facility]-EMERGENCY DEPT Provider Note   CSN: [id number] Arrival date & time: 08/18/19  1191     History   Chief Complaint Chief Complaint  Patient presents with  . Rash    HPI Colleen Ramirez is a 38 y.o. female.  Presents to ER with facial redness, swelling, worse on left side, goes over her left eye.  Noted some swelling to her left eyelid, making it difficult to open eyelid.  No pain with eye movement.  No fevers.  Symptoms for the past day or so.  Patient endorses drug abuse, meth abuse.     HPI  Past Medical History:  Diagnosis Date  . Anemia   . Depression   . Drug abuse [facility])     Patient Active Problem List   Diagnosis Date Noted  . Facial cellulitis 08/18/2019  . Sepsis (HCC) 07/24/2019  . Anemia   . Depression   . Drug abuse (HCC)   . Enteritis     Past Surgical History:  Procedure Laterality Date  . CESAREAN SECTION    . GASTRIC BYPASS       OB History   No obstetric history on file.      Home Medications    Prior to Admission medications   Medication Sig Start Date End Date Taking? Authorizing Provider  amoxicillin-clavulanate (AUGMENTIN) 875-125 MG tablet Take 1 tablet by mouth every 12 (twelve) hours. 07/31/19   Almon Hercules, MD  amphetamine-dextroamphetamine (ADDERALL) 20 MG tablet Take 20-40 mg by mouth 2 (two) times daily. Take 2 tablets (40mg ) in the morning and take 1 tablet (20mg ) in the afternoon    [provider]  clindamycin (CLEOCIN) 150 MG capsule Take 3 capsules (450 mg total) by mouth 3 (three) times daily for 10 days. 08/18/19 08/28/19  14/1/20, MD  ferrous sulfate 325 (65 FE) MG tablet Take 1 tablet (325 mg total) by mouth daily with breakfast. 07/31/19   Milagros Loll, MD  folic acid (FOLVITE) 1 MG tablet Take 1 tablet (1 mg total) by mouth daily. 07/31/19   Almon Hercules, MD  Multiple Vitamin (MULTIVITAMIN WITH MINERALS) TABS tablet Take 1 tablet by mouth daily. 07/31/19   Almon Hercules, MD  saccharomyces boulardii (FLORASTOR) 250 MG capsule Take 1 capsule (250 mg total) by mouth 2 (two) times daily. 07/31/19   Almon Hercules, MD    Family History History reviewed. No pertinent family history.  Social History Social History   Tobacco Use  . Smoking status: Current Every Day Smoker    Packs/day: 0.50    Years: 34.00    Pack years: 17.00    Types: Cigarettes  . Smokeless tobacco: Never Used  Substance Use Topics  . Alcohol use: Yes  . Drug use: Yes    Types: IV, Methamphetamines     Allergies   Patient has no known allergies.   Review of Systems Review of Systems  Constitutional: Negative for chills and fever.  HENT: Positive for facial swelling. Negative for ear pain and sore throat.   Eyes: Positive for redness. Negative for pain and visual disturbance.  Respiratory: Negative for cough and shortness of breath.   Cardiovascular: Negative for chest pain and palpitations.  Gastrointestinal: Negative for abdominal pain and vomiting.  Genitourinary: Negative for dysuria and hematuria.  Musculoskeletal: Negative for arthralgias and back pain.  Skin: Negative for color change and rash.  Neurological: Negative for seizures and syncope.  All other systems reviewed and  are negative.    Physical Exam Updated Vital Signs BP (!) 147/100   Pulse (!) 108   Temp 99.3 F (37.4 C) (Oral)   Resp 18   SpO2 100%   Physical Exam Vitals signs and nursing note reviewed.  Constitutional:      General: She is not in acute distress.    Appearance: She is well-developed.  HENT:     Head: Normocephalic and atraumatic.     Comments: Blanchable erythema over left face, left eyelid, left and right forehead, left eyelid very swollen, extraocular movement intact without pain, conjunctiva are clear, not injected, pupils appear normal size equal round and reactive Eyes:     Conjunctiva/sclera: Conjunctivae normal.  Neck:     Musculoskeletal: Neck supple.   Cardiovascular:     Rate and Rhythm: Regular rhythm. Tachycardia present.     Heart sounds: No murmur.  Pulmonary:     Effort: Pulmonary effort is normal. No respiratory distress.     Breath sounds: Normal breath sounds.  Abdominal:     Palpations: Abdomen is soft.     Tenderness: There is no abdominal tenderness.  Musculoskeletal:        General: No swelling or tenderness.  Skin:    General: Skin is warm and dry.     Capillary Refill: Capillary refill takes less than 2 seconds.  Neurological:     General: No focal deficit present.     Mental Status: She is alert and oriented to person, place, and time.      ED Treatments / Results  Labs (all labs ordered are listed, but only abnormal results are displayed) Labs Reviewed  BASIC METABOLIC PANEL - Abnormal; Notable for the following components:      Result Value   Sodium 134 (*)    Glucose, Bld 103 (*)    All other components within normal limits  CBC WITH DIFFERENTIAL/PLATELET - Abnormal; Notable for the following components:   WBC 15.8 (*)    Hemoglobin 11.4 (*)    RDW 17.2 (*)    Neutro Abs 12.4 (*)    Monocytes Absolute 1.4 (*)    Abs Immature Granulocytes 0.08 (*)    All other components within normal limits  CULTURE, BLOOD (ROUTINE X 2)  CULTURE, BLOOD (ROUTINE X 2)  SARS CORONAVIRUS 2 (TAT 6-24 HRS)  C-REACTIVE PROTEIN  CBC WITH DIFFERENTIAL/PLATELET  I-STAT BETA HCG BLOOD, ED ([facility], [facility], [facility] ONLY)    EKG None  Radiology Ct Orbits W Contrast  Result Date: 08/18/2019 CLINICAL DATA:  Cellulitis of orbit. EXAM: CT ORBITS WITH CONTRAST TECHNIQUE: Multidetector CT images was performed according to the standard protocol following intravenous contrast administration. CONTRAST:  12mL OMNIPAQUE IOHEXOL 300 MG/ML  SOLN COMPARISON:  No pertinent prior studies available for comparison. FINDINGS: Orbits: The globes are normal in size and contour. No definite postseptal extension of inflammatory changes. The extraocular  muscles and optic nerve sheath complexes are symmetric and unremarkable. Visualized sinuses: Mild scattered ethmoid sinus mucosal thickening. No significant mastoid effusion. Soft tissues: There is prominent soft tissue swelling and induration involving the forehead and right greater than left periorbital and maxillofacial soft tissues. No organized soft tissue abscess is identified. No appreciable mass or swelling within the visualized pharynx. Limited intracranial: No abnormality identified. IMPRESSION: Prominent soft tissue swelling and induration involving the forehead and right greater than left periorbital and maxillofacial soft tissues. Findings are nonspecific, but may reflect cellulitis. No organized abscess. No definite postseptal orbital extension of inflammatory  changes on the current exam. Close clinical follow-up recommended with repeat imaging, as warranted. Mild ethmoid sinus mucosal thickening. Electronically Signed   By: Jackey LogeKyle  Golden DO   On: 08/18/2019 10:51    Procedures Procedures (including critical care time)  Medications Ordered in ED Medications  sodium chloride (PF) 0.9 % injection (has no administration in time range)  clindamycin (CLEOCIN) IVPB 600 mg (has no administration in time range)  HYDROmorphone (DILAUDID) injection 0.5 mg (0.5 mg Intravenous Given 08/18/19 0942)  iohexol (OMNIPAQUE) 300 MG/ML solution 75 mL (75 mLs Intravenous Contrast Given 08/18/19 1021)     Initial Impression / Assessment and Plan / ED Course  I have reviewed the triage vital signs and the nursing notes.  Pertinent labs & imaging results that were available during my care of the patient were reviewed by me and considered in my medical decision making (see chart for details).  Clinical Course as of Aug 17 1321  Tue Aug 18, 2019  1230 Rechecked, swelling has worsened, unable to spontaneously open either eye now, mildly tachy, will start iv abx and admit   [RD]  1250 Discussed with yates who  will accept   [RD]    Clinical Course User Index [RD] Milagros Lollykstra, Kerington Hildebrant S, MD       38 year old lady presented to the ER with facial redness, swelling.  Concern for facial cellulitis.  Labs concerning for leukocytosis, no significant elevation in CRP.  CT scan ordered to evaluate for deeper extension, rule out orbital cellulitis.  CT negative cord deeper extension, no pain with EOM.  On reassessment, patient's swelling had increased on the right side of her face and patient had difficulty opening both eyelids due to the eyelid swelling.  She remains mildly tachycardic.  Given mild tachycardia, increase in symptoms, leukocytosis, believe she would benefit from further observation and IV antibiotics as inpatient.  Obtain blood cultures, started IV Clinda.  Discussed with hospitalist who agrees to admit patient for further management.  Dr. Ophelia CharterYates with hospitalist will admit.  Final Clinical Impressions(s) / ED Diagnoses   Final diagnoses:  Facial cellulitis    ED Discharge Orders         Ordered    clindamycin (CLEOCIN) 150 MG capsule  3 times daily     08/18/19 1207           Milagros Lollykstra, Ural Acree S, MD 08/18/19 1322

## 2019-08-18 NOTE — ED Notes (Signed)
Patient placed up for discharge and AVS printed, but MD would like a second opinion MD consult prior to discharge. Patient changed back to "in progress" status pending consultation.

## 2019-08-18 NOTE — H&P (Signed)
History and Physical    Colleen LexLeah Wieck XWR:[id number] DOB: 1981-05-19 DOA: 08/18/2019  PCP: Patient, No Pcp Per Consultants:  None Patient coming from:  "Basically homeless now"; NOK: No one  Chief Complaint: facial rash  HPI: Colleen Ramirez is a 38 y.o. female with medical history significant of gastric bypass; polysubstance abuse; depression; and anemia presenting with facial rash.  Symptoms started yesterday AM.  She was otherwise disinterested in providing history.   ED Course: Presented with right-sided facial numbness/swelling.  CT negative for orbital cellulitis.  EDP went to check on her but the swelling and redness is progressing.  Given IV Clindamycin, concern that she may need admission.    Review of Systems: Unable to perform   Ambulatory Status:  Ambulates without assistance  Past Medical History:  Diagnosis Date  . Anemia   . Depression   . Drug abuse [facility](HCC)     Past Surgical History:  Procedure Laterality Date  . CESAREAN SECTION    . GASTRIC BYPASS      Social History   Socioeconomic History  . Marital status: Single    Spouse name: Not on file  . Number of children: Not on file  . Years of education: Not on file  . Highest education level: Not on file  Occupational History  . Not on file  Social Needs  . Financial resource strain: Not on file  . Food insecurity    Worry: Not on file    Inability: Not on file  . Transportation needs    Medical: Not on file    Non-medical: Not on file  Tobacco Use  . Smoking status: Current Every Day Smoker    Packs/day: 0.50    Years: 34.00    Pack years: 17.00    Types: Cigarettes  . Smokeless tobacco: Never Used  Substance and Sexual Activity  . Alcohol use: Yes  . Drug use: Yes    Types: IV, Methamphetamines  . Sexual activity: Not on file  Lifestyle  . Physical activity    Days per week: Not on file    Minutes per session: Not on file  . Stress: Not on file  Relationships  . Social Wellsite geologistconnections     Talks on phone: Not on file    Gets together: Not on file    Attends religious service: Not on file    Active member of club or organization: Not on file    Attends meetings of clubs or organizations: Not on file    Relationship status: Not on file  . Intimate partner violence    Fear of current or ex partner: Not on file    Emotionally abused: Not on file    Physically abused: Not on file    Forced sexual activity: Not on file  Other Topics Concern  . Not on file  Social History Narrative  . Not on file    No Known Allergies  History reviewed. No pertinent family history.  Prior to Admission medications   Medication Sig Start Date End Date Taking? Authorizing Provider  amoxicillin-clavulanate (AUGMENTIN) 875-125 MG tablet Take 1 tablet by mouth every 12 (twelve) hours. 07/31/19   Almon HerculesGonfa, Taye T, MD  amphetamine-dextroamphetamine (ADDERALL) 20 MG tablet Take 20-40 mg by mouth 2 (two) times daily. Take 2 tablets (40mg ) in the morning and take 1 tablet (20mg ) in the afternoon    [provider]  clindamycin (CLEOCIN) 150 MG capsule Take 3 capsules (450 mg total) by mouth 3 (three) times  daily for 10 days. 08/18/19 08/28/19  Milagros Loll, MD  ferrous sulfate 325 (65 FE) MG tablet Take 1 tablet (325 mg total) by mouth daily with breakfast. 07/31/19   Almon Hercules, MD  folic acid (FOLVITE) 1 MG tablet Take 1 tablet (1 mg total) by mouth daily. 07/31/19   Almon Hercules, MD  Multiple Vitamin (MULTIVITAMIN WITH MINERALS) TABS tablet Take 1 tablet by mouth daily. 07/31/19   Almon Hercules, MD  saccharomyces boulardii (FLORASTOR) 250 MG capsule Take 1 capsule (250 mg total) by mouth 2 (two) times daily. 07/31/19   Almon Hercules, MD    Physical Exam: Vitals:   08/18/19 1424 08/18/19 1430 08/18/19 1510 08/18/19 1534  BP: (!) 141/101 (!) 146/97 (!) 144/96 (!) 144/96  Pulse: 91 94 83 83  Resp: 18  16 16   Temp:   (!) 100.7 F (38.2 C) (!) 100.7 F (38.2 C)  TempSrc:   Oral  Oral  SpO2: 100% 100% 100%   Weight:    56.5 kg  Height:    5' 3.5" (1.613 m)     . General:  Marked eyelid edema, generally unwilling to have conversation (but did report that she needed to void as I was leaving the room) . Eyes:  Marked eyelid edema, less prominent generalized facial edema . ENT:  grossly normal hearing, lips & tongue, mmm; poor dentition on limited view (refused to open her mouth) . Neck:  no LAD, masses or thyromegaly . Cardiovascular:  RRR, no m/r/g. No LE edema.  Respiratory:   CTA bilaterally with no wheezes/rales/rhonchi.  Normal respiratory effort. . Abdomen:  soft, NT, ND, NABS . Skin:  Eyelid and mid-brow erythema and edema, maculopapular lesions on lower face        . Musculoskeletal:  grossly normal tone BUE/BLE, good ROM, no bony abnormality . Psychiatric:  Very flat mood and affect, overall apathy with unwillingness to interact . Neurologic:  Unable to perform    Radiological Exams on Admission: Ct Orbits W Contrast  Result Date: 08/18/2019 CLINICAL DATA:  Cellulitis of orbit. EXAM: CT ORBITS WITH CONTRAST TECHNIQUE: Multidetector CT images was performed according to the standard protocol following intravenous contrast administration. CONTRAST:  64mL OMNIPAQUE IOHEXOL 300 MG/ML  SOLN COMPARISON:  No pertinent prior studies available for comparison. FINDINGS: Orbits: The globes are normal in size and contour. No definite postseptal extension of inflammatory changes. The extraocular muscles and optic nerve sheath complexes are symmetric and unremarkable. Visualized sinuses: Mild scattered ethmoid sinus mucosal thickening. No significant mastoid effusion. Soft tissues: There is prominent soft tissue swelling and induration involving the forehead and right greater than left periorbital and maxillofacial soft tissues. No organized soft tissue abscess is identified. No appreciable mass or swelling within the visualized pharynx. Limited intracranial: No  abnormality identified. IMPRESSION: Prominent soft tissue swelling and induration involving the forehead and right greater than left periorbital and maxillofacial soft tissues. Findings are nonspecific, but may reflect cellulitis. No organized abscess. No definite postseptal orbital extension of inflammatory changes on the current exam. Close clinical follow-up recommended with repeat imaging, as warranted. Mild ethmoid sinus mucosal thickening. Electronically Signed   By: 71m DO   On: 08/18/2019 10:51    EKG: not done   Labs on Admission: I have personally reviewed the available labs and imaging studies at the time of the admission.  Pertinent labs:   Reassuring BMP CRP <0.8 WBC 15.8 Hgb 11.4 HCG negative COVID pending  Assessment/Plan Principal Problem:   Facial cellulitis Active Problems:   Depression   Drug abuse (HCC)   Facial cellulitis -Patient with h/o prior recent admission for sepsis thought to be secondary to intraabdominal infection, treated with Augmentin as an outpatient -Her history is complicated by homelessness, substance abuse -She has poor dentition and dental abscess could be the source -She also has scattered maculopapular (not pustular, not c/w HSV) lesions on her face -Currently with erythema to B eyes and forehead, less to the upper maxillary region -She was given Clindamycin in the ER -The ER was planning to send her home but the edema extended onto her eyelids and made it difficult for her to keep her eyes open -Will observe for now -Treat with IV Rocephin and Flagyl (to also cover mouth flora) with probable d/c on Ceftin and Flagyl tomorrow if improving -If not improving tomorrow, she may need broadening of her antibiotics  Drug abuse -Patient has an amphetamine addiction and also uses other drugs -Prior UDS on 11/6 was + for amphetamines, cocaine, and opiates -Repeat UDS is pending -I have reviewed this patient in the [place] Controlled  Substances Reporting System.  She is not receiving any controlled substance prescriptions legitimately and so the Adderall listed on her MAR is not prescribed and should not be prescribed in this circumstance  Homelessness -SW consult  Depression -Patient was incredibly reluctant to interact and appeared quite depressed -Consider psych evaluation if she is not improved once her cellulitis is better controlled   Note: This patient has been tested and is pending for the novel coronavirus COVID-19.  DVT prophylaxis:  Lovenox  Code Status:  Full - confirmed with patient Family Communication: None present  Disposition Plan:  Home once clinically improved Consults called: None  Admission status: It is my clinical opinion that referral for OBSERVATION is reasonable and necessary in this patient based on the above information provided. The aforementioned taken together are felt to place the patient at high risk for further clinical deterioration. However it is anticipated that the patient may be medically stable for discharge from the hospital within 24 to 48 hours.    Karmen Bongo MD [facility]   How to contact the [facility] Attending or Consulting provider [place] or covering provider during after hours [place], for this patient?  1. Check the care team in [facility] and look for a) attending/consulting [facility] provider listed and b) the [facility] team listed 2. Log into [email or web address] and use  [facility] universal password to access. If you do not have the password, please contact the hospital operator. 3. Locate the [facility] provider you are looking for under [facility] and page to a number that you can be directly reached. 4. If you still have difficulty reaching the provider, please page the [facility] (Director on Call) for the Hospitalists listed on amion for assistance.   08/18/2019, 5:25 PM

## 2019-08-19 DIAGNOSIS — L0390 Cellulitis, unspecified: Secondary | ICD-10-CM | POA: Diagnosis present

## 2019-08-19 DIAGNOSIS — F1910 Other psychoactive substance abuse, uncomplicated: Secondary | ICD-10-CM

## 2019-08-19 DIAGNOSIS — L705 Acne excoriee des jeunes filles: Secondary | ICD-10-CM

## 2019-08-19 DIAGNOSIS — F329 Major depressive disorder, single episode, unspecified: Secondary | ICD-10-CM

## 2019-08-19 LAB — BASIC METABOLIC PANEL
Anion gap: 9 (ref 5–15)
BUN: 9 mg/dL (ref 6–20)
CO2: 25 mmol/L (ref 22–32)
Calcium: 9.2 mg/dL (ref 8.9–10.3)
Chloride: 103 mmol/L (ref 98–111)
Creatinine, Ser: 0.58 mg/dL (ref 0.44–1.00)
GFR calc Af Amer: >60 mL/min (ref >60)
GFR calc non Af Amer: >60 mL/min (ref >60)
Glucose, Bld: 97 mg/dL (ref 70–99)
Potassium: 3.8 mmol/L (ref 3.5–5.1)
Sodium: 137 mmol/L (ref 135–145)

## 2019-08-19 LAB — CBC
HCT: 38.6 % (ref 36.0–46.0)
Hemoglobin: 12.2 g/dL (ref 12.0–15.0)
MCH: 27.2 pg (ref 26.0–34.0)
MCHC: 31.6 g/dL (ref 30.0–36.0)
MCV: 86 fL (ref 80.0–100.0)
Platelets: 362 10*3/uL (ref 150–400)
RBC: 4.49 MIL/uL (ref 3.87–5.11)
RDW: 17.3 % — ABNORMAL HIGH (ref 11.5–15.5)
WBC: 15.3 10*3/uL — ABNORMAL HIGH (ref 4.0–10.5)
nRBC: 0 % (ref 0.0–0.2)

## 2019-08-19 MED ORDER — AMPHETAMINE-DEXTROAMPHETAMINE 10 MG PO TABS: 20.0000 mg | ORAL_TABLET | Freq: Every day | ORAL | Status: DC

## 2019-08-19 MED ORDER — CLINDAMYCIN PHOSPHATE 600 MG/50ML IV SOLN
600.0000 mg | Freq: Three times a day (TID) | INTRAVENOUS | Status: DC
  Administered 2019-08-19 – 2019-08-25 (×17): 600 mg via INTRAVENOUS
  Filled 2019-08-19 (×18): qty 50

## 2019-08-19 MED ORDER — FENTANYL CITRATE (PF) 100 MCG/2ML IJ SOLN
25.0000 ug | Freq: Once | INTRAMUSCULAR | Status: AC
  Administered 2019-08-19: 25 ug via INTRAVENOUS
  Filled 2019-08-19: qty 2

## 2019-08-19 MED ORDER — FENTANYL CITRATE (PF) 100 MCG/2ML IJ SOLN: 12.5000 ug | Freq: Once | INTRAMUSCULAR | Status: DC

## 2019-08-19 MED ORDER — HYDROCODONE-ACETAMINOPHEN 5-325 MG PO TABS
1.0000 | ORAL_TABLET | Freq: Four times a day (QID) | ORAL | Status: DC | PRN
  Administered 2019-08-19 – 2019-08-25 (×19): 2 via ORAL
  Filled 2019-08-19 (×19): qty 2

## 2019-08-19 MED ORDER — HYDROCODONE-ACETAMINOPHEN 5-325 MG PO TABS
1.0000 | ORAL_TABLET | Freq: Once | ORAL | Status: AC
  Administered 2019-08-19: 1 via ORAL
  Filled 2019-08-19: qty 1

## 2019-08-19 MED ORDER — TRAZODONE HCL 50 MG PO TABS
50.0000 mg | ORAL_TABLET | Freq: Every day | ORAL | Status: DC
  Administered 2019-08-19 – 2019-08-24 (×6): 50 mg via ORAL
  Filled 2019-08-19 (×7): qty 1

## 2019-08-19 MED ORDER — HYDROXYZINE HCL 25 MG PO TABS
25.0000 mg | ORAL_TABLET | Freq: Three times a day (TID) | ORAL | Status: DC | PRN
  Administered 2019-08-20 – 2019-08-25 (×10): 25 mg via ORAL
  Filled 2019-08-19 (×10): qty 1

## 2019-08-19 NOTE — Consult Note (Signed)
Telepsych Consultation   Reason for Consult: Situational depression Referring Physician:  Dr Lajuana Ripple Location of Patient: [facility] Location of Provider: [facility]  Patient Identification: Colleen Ramirez MRN:  [id number] Principal Diagnosis: Facial cellulitis Diagnosis:  Principal Problem:   Facial cellulitis Active Problems:   Depression   Drug abuse (HCC)   Cellulitis   Total Time spent with patient: 30 minutes  Subjective:   Colleen Ramirez is a 38 y.o. female patient admitted with right-sided facial numbness and swelling.  Patient assessed by nurse practitioner.  Patient alert and oriented for assessment,  Answers appropriately.  Patient denies suicidal and homicidal ideations.  Patient denies self-harm, denies history of suicide attempts.  Patient denies access to weapons.  Patient denies paranoia and hallucinations.  Patient reports history of anxiety, depression and PTSD.  Patient reports family history including mother who suffers from depression and father who was diagnosed with anxiety. Patient currently homeless, left [place] "a couple of months ago."  Patient initially staying with son in [place] however can no longer reside with son.  Patient currently homeless, reports discussed housing resources with social work today. Patient becomes agitated when discussing medication recommendation.  Patient states "how will an antidepressant help my fucking panic attacks, I am tired of wasting my time."  Patient states "I will take Adderall and Valium nothing else." Case discussed with Dr Lucianne Muss.    HPI:  Colleen Ramirez is a 38 y.o. female.  Presents to ER with facial redness, swelling, worse on left side, goes over her left eye.  Noted some swelling to her left eyelid, making it difficult to open eyelid. History of substance use and depression, tearful upon admission.   Past Psychiatric History: Anxiety, MDD, PTSD  Risk to Self:  No Risk to Others:   No Prior Inpatient Therapy:  Yes Prior Outpatient Therapy:  Yes  Past Medical History:  Past Medical History:  Diagnosis Date  . Anemia   . Depression   . Drug abuse [facility])     Past Surgical History:  Procedure Laterality Date  . CESAREAN SECTION    . GASTRIC BYPASS     Family History: History reviewed. No pertinent family history. Family Psychiatric  History: Mother-depression, father-anxiety Social History:  Social History   Substance and Sexual Activity  Alcohol Use Yes     Social History   Substance and Sexual Activity  Drug Use Yes  . Types: IV, Methamphetamines    Social History   Socioeconomic History  . Marital status: Single    Spouse name: Not on file  . Number of children: Not on file  . Years of education: Not on file  . Highest education level: Not on file  Occupational History  . Not on file  Social Needs  . Financial resource strain: Not on file  . Food insecurity    Worry: Not on file    Inability: Not on file  . Transportation needs    Medical: Not on file    Non-medical: Not on file  Tobacco Use  . Smoking status: Current Every Day Smoker    Packs/day: 0.50    Years: 34.00    Pack years: 17.00    Types: Cigarettes  . Smokeless tobacco: Never Used  Substance and Sexual Activity  . Alcohol use: Yes  . Drug use: Yes    Types: IV, Methamphetamines  . Sexual activity: Not on file  Lifestyle  . Physical activity    Days per week: Not on  file    Minutes per session: Not on file  . Stress: Not on file  Relationships  . Social Herbalist on phone: Not on file    Gets together: Not on file    Attends religious service: Not on file    Active member of club or organization: Not on file    Attends meetings of clubs or organizations: Not on file    Relationship status: Not on file  Other Topics Concern  . Not on file  Social History Narrative  . Not on file   Additional Social History:    Allergies:  No Known  Allergies  Labs:  Results for orders placed or performed during the hospital encounter of 08/18/19 (from the past 48 hour(s))  Basic metabolic panel     Status: Abnormal   Collection Time: 08/18/19  9:38 AM  Result Value Ref Range   Sodium 134 (L) 135 - 145 mmol/L   Potassium 4.8 3.5 - 5.1 mmol/L    Comment: SLIGHT HEMOLYSIS   Chloride 98 98 - 111 mmol/L   CO2 25 22 - 32 mmol/L   Glucose, Bld 103 (H) 70 - 99 mg/dL   BUN 11 6 - 20 mg/dL   Creatinine, Ser 0.75 0.44 - 1.00 mg/dL   Calcium 9.9 8.9 - 10.3 mg/dL   GFR calc non Af Amer >60 >60 mL/min   GFR calc Af Amer >60 >60 mL/min   Anion gap 11 5 - 15    Comment: Performed at [facility], [place] [address], [place], [place] [address]  C-reactive protein     Status: None   Collection Time: 08/18/19  9:38 AM  Result Value Ref Range   CRP <0.8 <1.0 mg/dL    Comment: Performed at [facility], [place] [address], [place], [place] [address]  I-Stat Beta hCG blood, ED ([facility], [facility], [facility] only)     Status: None   Collection Time: 08/18/19  9:56 AM  Result Value Ref Range   I-stat hCG, quantitative <5.0 <5 mIU/mL   Comment 3            Comment:   GEST. AGE      CONC.  (mIU/mL)   <=1 WEEK        5 - 50     2 WEEKS       50 - 500     3 WEEKS       100 - 10,000     4 WEEKS     1,000 - 30,000        FEMALE AND NON-PREGNANT FEMALE:     LESS THAN 5 mIU/mL   CBC with Differential/Platelet     Status: Abnormal   Collection Time: 08/18/19 10:48 AM  Result Value Ref Range   WBC 15.8 (H) 4.0 - 10.5 K/uL   RBC 4.17 3.87 - 5.11 MIL/uL   Hemoglobin 11.4 (L) 12.0 - 15.0 g/dL   HCT 36.4 36.0 - 46.0 %   MCV 87.3 80.0 - 100.0 fL   MCH 27.3 26.0 - 34.0 pg   MCHC 31.3 30.0 - 36.0 g/dL   RDW 17.2 (H) 11.5 - 15.5 %   Platelets 364 150 - 400 K/uL   nRBC 0.0 0.0 - 0.2 %   Neutrophils Relative % 78 %   Neutro Abs 12.4 (H) 1.7 - 7.7 K/uL   Lymphocytes Relative 11 %   Lymphs Abs 1.8 0.7 - 4.0 K/uL   Monocytes  Relative 9 %    Monocytes Absolute 1.4 (H) 0.1 - 1.0 K/uL   Eosinophils Relative 1 %   Eosinophils Absolute 0.1 0.0 - 0.5 K/uL   Basophils Relative 0 %   Basophils Absolute 0.1 0.0 - 0.1 K/uL   Immature Granulocytes 1 %   Abs Immature Granulocytes 0.08 (H) 0.00 - 0.07 K/uL    Comment: Performed at [facility], [address] [place], [place] [address]  Blood culture (routine x 2)     Status: None (Preliminary result)   Collection Time: 08/18/19 12:49 PM   Specimen: BLOOD  Result Value Ref Range   Specimen Description      BLOOD LEFT ANTECUBITAL Performed at [facility], [address] [place], [place] [address]    Special Requests      BOTTLES DRAWN AEROBIC AND ANAEROBIC Blood Culture adequate volume Performed at [facility], [address] [place], [place] [address]    Culture      NO GROWTH < 24 HOURS Performed at [facility], [address] [place], [place] [address]    Report Status PENDING   SARS CORONAVIRUS 2 (TAT 6-24 HRS) Nasopharyngeal Nasopharyngeal Swab     Status: None   Collection Time: 08/18/19 12:49 PM   Specimen: Nasopharyngeal Swab  Result Value Ref Range   SARS Coronavirus 2 NEGATIVE NEGATIVE    Comment: (NOTE) SARS-CoV-2 target nucleic acids are NOT DETECTED. The SARS-CoV-2 RNA is generally detectable in upper and lower respiratory specimens during the acute phase of infection. Negative results do not preclude SARS-CoV-2 infection, do not rule out co-infections with other pathogens, and should not be used as the sole basis for treatment or other patient management decisions. Negative results must be combined with clinical observations, patient history, and epidemiological information. The expected result is Negative. Fact Sheet for Patients: HairSlick.nohttps://www.fda.gov/media/138098/download Fact Sheet for Healthcare Providers: quierodirigir.comhttps://www.fda.gov/media/138095/download This test is not yet approved or cleared  by the [place] FDA and  has been authorized for detection and/or diagnosis of SARS-CoV-2 by FDA under an Emergency Use Authorization (EUA). This EUA will remain  in effect (meaning this test can be used) for the duration of the COVID-19 declaration under Section 56 4(b)(1) of the Act, 21 U.S.C. section 360bbb-3(b)(1), unless the authorization is terminated or revoked sooner. Performed at [facility], [address] [place], [place] [address]   Blood culture (routine x 2)     Status: None (Preliminary result)   Collection Time: 08/18/19  1:30 PM   Specimen: BLOOD LEFT HAND  Result Value Ref Range   Specimen Description      BLOOD LEFT HAND Performed at [facility], [address] [place], [place] [address]    Special Requests      BOTTLES DRAWN AEROBIC AND ANAEROBIC Blood Culture adequate volume Performed at [facility], [address] [place], [place] [address]    Culture      NO GROWTH < 24 HOURS Performed at [facility], [address] [place], [place] [address]    Report Status PENDING   Urine rapid drug screen (hosp performed)     Status: Abnormal   Collection Time: 08/18/19  5:33 PM  Result Value Ref Range   Opiates NONE DETECTED NONE DETECTED   Cocaine NONE DETECTED NONE DETECTED   Benzodiazepines NONE DETECTED NONE DETECTED   Amphetamines POSITIVE (A) NONE DETECTED   Tetrahydrocannabinol NONE DETECTED NONE DETECTED  Barbiturates NONE DETECTED NONE DETECTED    Comment: (NOTE) DRUG SCREEN FOR MEDICAL PURPOSES ONLY.  IF CONFIRMATION IS NEEDED FOR ANY PURPOSE, NOTIFY LAB WITHIN 5 DAYS. LOWEST DETECTABLE LIMITS FOR URINE DRUG SCREEN Drug Class                     Cutoff (ng/mL) Amphetamine and metabolites    1000 Barbiturate and metabolites    200 Benzodiazepine                 200 Tricyclics and metabolites     300 Opiates and metabolites        300 Cocaine and metabolites        300 THC                             50 Performed at [facility], [address], [place], [place] [address]   Basic metabolic panel     Status: None   Collection Time: 08/19/19  5:22 AM  Result Value Ref Range   Sodium 137 135 - 145 mmol/L   Potassium 3.8 3.5 - 5.1 mmol/L    Comment: DELTA CHECK NOTED   Chloride 103 98 - 111 mmol/L   CO2 25 22 - 32 mmol/L   Glucose, Bld 97 70 - 99 mg/dL   BUN 9 6 - 20 mg/dL   Creatinine, Ser 6.04 0.44 - 1.00 mg/dL   Calcium 9.2 8.9 - 54.0 mg/dL   GFR calc non Af Amer >60 >60 mL/min   GFR calc Af Amer >60 >60 mL/min   Anion gap 9 5 - 15    Comment: Performed at [facility], [address], [place], [place] [address]  CBC     Status: Abnormal   Collection Time: 08/19/19  5:22 AM  Result Value Ref Range   WBC 15.3 (H) 4.0 - 10.5 K/uL   RBC 4.49 3.87 - 5.11 MIL/uL   Hemoglobin 12.2 12.0 - 15.0 g/dL   HCT 14.7 82.9 - 56.2 %   MCV 86.0 80.0 - 100.0 fL   MCH 27.2 26.0 - 34.0 pg   MCHC 31.6 30.0 - 36.0 g/dL   RDW 13.0 (H) 86.5 - 78.4 %   Platelets 362 150 - 400 K/uL   nRBC 0.0 0.0 - 0.2 %    Comment: Performed at [facility], [address] [place], [place] [address]    Medications:  Current Facility-Administered Medications  Medication Dose Route Frequency Provider Last Rate Last Dose  . acetaminophen (TYLENOL) tablet 650 mg  650 mg Oral Q6H PRN Jonah Blue, MD   650 mg at 08/19/19 0316   Or  . acetaminophen (TYLENOL) suppository 650 mg  650 mg Rectal Q6H PRN Jonah Blue, MD      . Melene Muller ON 08/20/2019] amphetamine-dextroamphetamine (ADDERALL) tablet 20 mg  20 mg Oral Q breakfast Kamineni, Neelima, MD      . cefTRIAXone (ROCEPHIN) 1 g in sodium chloride 0.9 % 100 mL IVPB  1 g Intravenous Daily Jonah Blue, MD 200 mL/hr at 08/19/19 1101 1 g at 08/19/19 1101  . docusate sodium (COLACE) capsule 100 mg  100 mg Oral BID Jonah Blue, MD      . enoxaparin (LOVENOX) injection 40 mg  40 mg  Subcutaneous Q24H Jonah Blue, MD      . HYDROcodone-acetaminophen (NORCO/VICODIN) 5-325 MG per tablet 1-2 tablet  1-2 tablet Oral Q6H  PRN Alessandra Bevels, MD      . hydrOXYzine (ATARAX/VISTARIL) tablet 25 mg  25 mg Oral TID PRN Alessandra Bevels, MD      . metroNIDAZOLE (FLAGYL) IVPB 500 mg  500 mg Intravenous Bobette Mo, MD 100 mL/hr at 08/19/19 0937 500 mg at 08/19/19 0937  . ondansetron (ZOFRAN) tablet 4 mg  4 mg Oral Q6H PRN Jonah Blue, MD       Or  . ondansetron [facility]) injection 4 mg  4 mg Intravenous Q6H PRN Jonah Blue, MD      . traZODone (DESYREL) tablet 50 mg  50 mg Oral QHS Alessandra Bevels, MD        Musculoskeletal: Strength & Muscle Tone: within normal limits Gait & Station: normal Patient leans: N/A  Psychiatric Specialty Exam: Physical Exam  Nursing note and vitals reviewed. Constitutional: She is oriented to person, place, and time. She appears well-developed.  HENT:  Head: Normocephalic.  Cardiovascular: Normal rate.  Respiratory: Effort normal.  Neurological: She is alert and oriented to person, place, and time.  Psychiatric: She has a normal mood and affect. Her behavior is normal. Judgment and thought content normal.    Review of Systems  Constitutional: Negative.   HENT: Negative.   Eyes: Negative.   Respiratory: Negative.   Cardiovascular: Negative.   Gastrointestinal: Negative.   Genitourinary: Negative.   Musculoskeletal: Negative.   Skin: Negative.   Neurological: Negative.   Endo/Heme/Allergies: Negative.   Psychiatric/Behavioral: Positive for substance abuse.    Blood pressure (!) 136/96, pulse 68, temperature 98.7 F (37.1 C), resp. rate 16, height 5' 3.5" (1.613 m), weight 56.5 kg, SpO2 99 %.Body mass index is 21.72 kg/m.  General Appearance: Casual  Eye Contact:  Good  Speech:  Clear and Coherent and Normal Rate  Volume:  Normal  Mood:  Depressed  Affect:  Appropriate and Depressed  Thought Process:  Coherent,  Goal Directed and Descriptions of Associations: Intact  Orientation:  Full (Time, Place, and Person)  Thought Content:  WDL and Logical  Suicidal Thoughts:  No  Homicidal Thoughts:  No  Memory:  Immediate;   Good Recent;   Good Remote;   Good  Judgement:  Fair  Insight:  Fair  Psychomotor Activity:  Normal  Concentration:  Concentration: Good and Attention Span: Good  Recall:  Good  Fund of Knowledge:  Good  Language:  Good  Akathisia:  No  Handed:  Right  AIMS (if indicated):     Assets:  Communication Skills Desire for Improvement Financial Resources/Insurance Resilience Social Support  ADL's:  Intact  Cognition:  WNL  Sleep:        Treatment Plan Summary: Medication management  Recommend consider Prozac 10mg  by mouth daily.   Disposition: No evidence of imminent risk to self or others at present.   Patient does not meet criteria for psychiatric inpatient admission. Supportive therapy provided about ongoing stressors. Discussed crisis plan, support from social network, calling 911, coming to the Emergency Department, and calling Suicide Hotline.  This service was provided via telemedicine using a 2-way, interactive audio and video technology.  Names of all persons participating in this telemedicine service and their role in this encounter. Name: Role: Patient  Name: Gonzella Lex Role: FNP    Berneice Heinrich, FNP 08/19/2019 12:30 PM
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
## 2019-08-20 MED ORDER — FLUOXETINE HCL 10 MG PO CAPS
10.0000 mg | ORAL_CAPSULE | Freq: Every day | ORAL | Status: DC
  Administered 2019-08-20 – 2019-08-25 (×6): 10 mg via ORAL
  Filled 2019-08-20 (×6): qty 1

## 2019-08-20 MED ORDER — DM-GUAIFENESIN ER 30-600 MG PO TB12: 1.0000 | ORAL_TABLET | Freq: Two times a day (BID) | ORAL | Status: DC | PRN

## 2019-08-20 MED ORDER — SALINE SPRAY 0.65 % NA SOLN
1.0000 | NASAL | Status: DC | PRN
  Administered 2019-08-21 – 2019-08-22 (×3): 1 via NASAL
  Filled 2019-08-20: qty 44
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
## 2019-08-20 NOTE — TOC Progression Note (Signed)
Transition of Care [facility]) - Progression Note    Patient Details  Name: Colleen Ramirez MRN: [id number] Date of Birth: 1981/08/14  Transition of Care [facility]) CM/SW [place], [place] Phone Number: 08/20/2019, 11:09 AM  Clinical Narrative:  Got a call from [facility] last night saying they were trying to reach patient on room phone but no one was answering.  I instructed them to call nursing station and ask them to tell patient to answer phone.  Went to patient this AM to ask about conversation last night.  She stated no one called.  I instructed her to call them this AM; made sure she had the number.  TOC will continue to follow during the course of hospitalization.      Expected Discharge Plan: Home/Self Care Barriers to Discharge: No Barriers Identified  Expected Discharge Plan and Services Expected Discharge Plan: Home/Self Care In-house Referral: Clinical Social Work     Living arrangements for the past 2 months: Homeless                                       Social Determinants of Health ([place]) Interventions    Readmission Risk Interventions No flowsheet data found.

## 2019-08-21 LAB — CBC
HCT: 41.8 % (ref 36.0–46.0)
Hemoglobin: 13 g/dL (ref 12.0–15.0)
MCH: 27.2 pg (ref 26.0–34.0)
MCHC: 31.1 g/dL (ref 30.0–36.0)
MCV: 87.4 fL (ref 80.0–100.0)
Platelets: 354 10*3/uL (ref 150–400)
RBC: 4.78 MIL/uL (ref 3.87–5.11)
RDW: 17.2 % — ABNORMAL HIGH (ref 11.5–15.5)
WBC: 14.5 10*3/uL — ABNORMAL HIGH (ref 4.0–10.5)
nRBC: 0 % (ref 0.0–0.2)

## 2019-08-21 LAB — C-REACTIVE PROTEIN: CRP: 4.7 mg/dL — ABNORMAL HIGH (ref <1.0)

## 2019-08-21 LAB — SEDIMENTATION RATE: Sed Rate: 40 mm/hr — ABNORMAL HIGH (ref 0–22)

## 2019-08-21 NOTE — TOC Progression Note (Signed)
Transition of Care [facility]) - Progression Note    Patient Details  Name: Colleen Ramirez MRN: [id number] Date of Birth: [birth year]  Transition of Care [facility]) CM/SW [place], [place] Phone Number: 08/21/2019, 1:51 PM  Clinical Narrative:   [name] continues to refuse to engage with me about discharge plan.  I updated her RN today, asked for help.  Katha Cabal stated she followed up and let her know that we must work together to nail down a plan today. TOC will continue to follow during the course of hospitalization.     Expected Discharge Plan: Home/Self Care Barriers to Discharge: No Barriers Identified  Expected Discharge Plan and Services Expected Discharge Plan: Home/Self Care In-house Referral: Clinical Social Work     Living arrangements for the past 2 months: Homeless                                       Social Determinants of Health ([place]) Interventions    Readmission Risk Interventions No flowsheet data found.
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
## 2019-08-22 DIAGNOSIS — F320 Major depressive disorder, single episode, mild: Secondary | ICD-10-CM

## 2019-08-22 LAB — BASIC METABOLIC PANEL
Anion gap: 10 (ref 5–15)
BUN: 7 mg/dL (ref 6–20)
CO2: 28 mmol/L (ref 22–32)
Calcium: 9.4 mg/dL (ref 8.9–10.3)
Chloride: 102 mmol/L (ref 98–111)
Creatinine, Ser: 0.62 mg/dL (ref 0.44–1.00)
GFR calc Af Amer: >60 mL/min (ref >60)
GFR calc non Af Amer: >60 mL/min (ref >60)
Glucose, Bld: 107 mg/dL — ABNORMAL HIGH (ref 70–99)
Potassium: 4.1 mmol/L (ref 3.5–5.1)
Sodium: 140 mmol/L (ref 135–145)

## 2019-08-22 LAB — CBC
HCT: 39.5 % (ref 36.0–46.0)
Hemoglobin: 12.2 g/dL (ref 12.0–15.0)
MCH: 26.9 pg (ref 26.0–34.0)
MCHC: 30.9 g/dL (ref 30.0–36.0)
MCV: 87.2 fL (ref 80.0–100.0)
Platelets: 387 10*3/uL (ref 150–400)
RBC: 4.53 MIL/uL (ref 3.87–5.11)
RDW: 17.2 % — ABNORMAL HIGH (ref 11.5–15.5)
WBC: 12.4 10*3/uL — ABNORMAL HIGH (ref 4.0–10.5)
nRBC: 0 % (ref 0.0–0.2)

## 2019-08-22 LAB — ENA+DNA/DS+ANTICH+CENTRO+JO...
Anti JO-1: <0.2 AI (ref 0.0–0.9)
Centromere Ab Screen: <0.2 AI (ref 0.0–0.9)
Chromatin Ab SerPl-aCnc: 0.7 AI (ref 0.0–0.9)
ENA SM Ab Ser-aCnc: <0.2 AI (ref 0.0–0.9)
Ribonucleic Protein: 1.2 AI — ABNORMAL HIGH (ref 0.0–0.9)
SSA (Ro) (ENA) Antibody, IgG: 0.4 AI (ref 0.0–0.9)
SSB (La) (ENA) Antibody, IgG: <0.2 AI (ref 0.0–0.9)
Scleroderma (Scl-70) (ENA) Antibody, IgG: <0.2 AI (ref 0.0–0.9)
ds DNA Ab: 3 IU/mL (ref 0–9)

## 2019-08-22 LAB — ANA W/REFLEX IF POSITIVE: Anti Nuclear Antibody (ANA): POSITIVE — AB
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
## 2019-08-23 LAB — CULTURE, BLOOD (ROUTINE X 2)
Culture: NO GROWTH
Culture: NO GROWTH
Special Requests: ADEQUATE
Special Requests: ADEQUATE

## 2019-08-23 MED ORDER — LIDOCAINE HCL 4 % EX SOLN
0.0000 mL | Freq: Once | CUTANEOUS | Status: DC | PRN
  Filled 2019-08-23: qty 50

## 2019-08-23 MED ORDER — TRIPLE ANTIBIOTIC 3.5-400-5000 EX OINT
1.0000 "application " | TOPICAL_OINTMENT | Freq: Once | CUTANEOUS | Status: DC | PRN
  Filled 2019-08-23: qty 1

## 2019-08-23 MED ORDER — SILVER NITRATE-POT NITRATE 75-25 % EX MISC
1.0000 | Freq: Once | CUTANEOUS | Status: DC | PRN
  Filled 2019-08-23: qty 1

## 2019-08-23 MED ORDER — LIDOCAINE HCL 2 % EX GEL
1.0000 "application " | Freq: Once | CUTANEOUS | Status: DC | PRN
  Filled 2019-08-23: qty 4250

## 2019-08-23 MED ORDER — OXYMETAZOLINE HCL 0.05 % NA SOLN: 1.0000 | Freq: Once | NASAL | Status: DC | PRN

## 2019-08-23 MED ORDER — LIDOCAINE-EPINEPHRINE (PF) 1 %-1:200000 IJ SOLN
0.0000 mL | Freq: Once | INTRAMUSCULAR | Status: DC | PRN
  Filled 2019-08-23: qty 30
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
## 2019-08-23 NOTE — Consult Note (Signed)
Colleen Ramirez is an 38 y.o. female.   Chief Complaint: Facial cellulitis HPI: Admitted to the hospital few days ago with progressively worsening facial swelling redness and pain.  History of polysubstance abuse.  While in the hospital she has been receiving intravenous antibiotics and has been improving significantly.  CT scan from admission reveals frontal scalp and upper face cellulitis without obvious fluid collection.  There is no intraorbital or sinus component.  She is not able to provide much history due to her lack of interest and cooperation.  Past Medical History:  Diagnosis Date  . Anemia   . Depression   . Drug abuse [facility])     Past Surgical History:  Procedure Laterality Date  . CESAREAN SECTION    . GASTRIC BYPASS      History reviewed. No pertinent family history. Social History:  reports that she has been smoking cigarettes. She has a 17.00 pack-year smoking history. She has never used smokeless tobacco. She reports current alcohol use. She reports current drug use. Drugs: IV and Methamphetamines.  Allergies: No Known Allergies  Medications Prior to Admission  Medication Sig Dispense Refill  . amphetamine-dextroamphetamine (ADDERALL) 20 MG tablet Take 20-40 mg by mouth 2 (two) times daily. Take 2 tablets (40mg ) in the morning and take 1 tablet (20mg ) in the afternoon    . ferrous sulfate 325 (65 FE) MG tablet Take 1 tablet (325 mg total) by mouth daily with breakfast. (Patient not taking: Reported on 08/18/2019) 90 tablet 0  . folic acid (FOLVITE) 1 MG tablet Take 1 tablet (1 mg total) by mouth daily. (Patient not taking: Reported on 08/18/2019) 90 tablet 1  . Multiple Vitamin (MULTIVITAMIN WITH MINERALS) TABS tablet Take 1 tablet by mouth daily. (Patient not taking: Reported on 08/18/2019) 90 tablet 0  . saccharomyces boulardii (FLORASTOR) 250 MG capsule Take 1 capsule (250 mg total) by mouth 2 (two) times daily. (Patient not taking: Reported on 08/18/2019) 10 capsule 0     Results for orders placed or performed during the hospital encounter of 08/18/19 (from the past 48 hour(s))  ANA w/Reflex if Positive     Status: Abnormal   Collection Time: 08/21/19  4:12 PM  Result Value Ref Range   Anti Nuclear Antibody (ANA) Positive (A) Negative    Comment: (NOTE) Performed At: [facility] [address] [place], [address] [place] [id number]   Sedimentation rate     Status: Abnormal   Collection Time: 08/21/19  4:12 PM  Result Value Ref Range   Sed Rate 40 (H) 0 - 22 mm/hr    Comment: Performed at [facility], [address], [place], [place]  C-reactive protein     Status: Abnormal   Collection Time: 08/21/19  4:12 PM  Result Value Ref Range   CRP 4.7 (H) <1.0 mg/dL    Comment: Performed at [facility], [address] [place], [place]  ENA+DNA/DS+ANTICH+CENTRO+JO...     Status: Abnormal   Collection Time: 08/21/19  4:12 PM  Result Value Ref Range   ds DNA Ab 3 0 - 9 IU/mL    Comment: (NOTE)                                   Negative      <5  Equivocal  5 - 9                                   Positive      >9    Ribonucleic Protein 1.2 (H) 0.0 - 0.9 AI   ENA SM Ab Ser-aCnc <0.2 0.0 - 0.9 AI   Scleroderma (Scl-70) (ENA) Antibody, IgG <0.2 0.0 - 0.9 AI   SSA (Ro) (ENA) Antibody, IgG 0.4 0.0 - 0.9 AI   SSB (La) (ENA) Antibody, IgG <0.2 0.0 - 0.9 AI   Chromatin Ab SerPl-aCnc 0.7 0.0 - 0.9 AI   Anti JO-1 <0.2 0.0 - 0.9 AI   Centromere Ab Screen <0.2 0.0 - 0.9 AI   See below: Comment     Comment: (NOTE) Autoantibody                       Disease Association ------------------------------------------------------------                        Condition                  Frequency ---------------------   ------------------------   --------- Antinuclear Antibody,    SLE, mixed connective Direct (ANA-D)           tissue  diseases ---------------------   ------------------------   --------- dsDNA                    SLE                        40 - 60% ---------------------   ------------------------   --------- Chromatin                Drug induced SLE                90%                         SLE                        48 - 97% ---------------------   ------------------------   --------- SSA (Ro)                 SLE                        25 - 35%                         Sjogren's Syndrome         40 - 70%                         Neonatal Lupus                 100% ---------------------   ------------------------   --------- SSB (La)                 SLE                              10%  Sjogren's Syndrome              30% ---------------------   -----------------------    --------- Sm (anti-Smith)          SLE                        15 - 30% ---------------------   -----------------------    --------- RNP                      Mixed Connective Tissue                         Disease                         95% (U1 nRNP,                SLE                        30 - 50% anti-ribonucleoprotein)  Polymyositis and/or                         Dermatomyositis                 20% ---------------------   ------------------------   --------- Scl-70 (antiDNA          Scleroderma (diffuse)      20 - 35% topoisomerase)           Crest                           13% ---------------------   ------------------------   --------- Jo-1                     Polymyositis and/or                         Dermatomyositis            20 - 40% ---------------------   ------------------------   --------- Centromere B             Scleroderma -  Crest                         variant                         80% Performed At: [facility] [place] [place], [place] [id number] Rush Farmer MD CZ:[id number]   CBC     Status: Abnormal   Collection Time: 08/22/19  5:31 AM  Result Value Ref Range    WBC 12.4 (H) 4.0 - 10.5 K/uL   RBC 4.53 3.87 - 5.11 MIL/uL   Hemoglobin 12.2 12.0 - 15.0 g/dL   HCT 39.5 36.0 - 46.0 %   MCV 87.2 80.0 - 100.0 fL   MCH 26.9 26.0 - 34.0 pg   MCHC 30.9 30.0 - 36.0 g/dL   RDW 17.2 (H) 11.5 - 15.5 %   Platelets 387 150 - 400 K/uL   nRBC 0.0 0.0 - 0.2 %    Comment: Performed at [facility], [place] [address] [place], [place] [address]  Basic metabolic panel     Status: Abnormal   Collection Time: 08/22/19  5:31  AM  Result Value Ref Range   Sodium 140 135 - 145 mmol/L   Potassium 4.1 3.5 - 5.1 mmol/L   Chloride 102 98 - 111 mmol/L   CO2 28 22 - 32 mmol/L   Glucose, Bld 107 (H) 70 - 99 mg/dL   BUN 7 6 - 20 mg/dL   Creatinine, Ser 1.610.62 0.44 - 1.00 mg/dL   Calcium 9.4 8.9 - 09.610.3 mg/dL   GFR calc non Af Amer >60 >60 mL/min   GFR calc Af Amer >60 >60 mL/min   Anion gap 10 5 - 15    Comment: Performed at [facility], [address] [place], [place] [id number]   No results found.  ROS: otherwise negative  Blood pressure 127/88, pulse 67, temperature 98.3 F (36.8 C), temperature source Oral, resp. rate 16, height 5' 3.5" (1.613 m), weight 56.5 kg, SpO2 100 %.  PHYSICAL EXAM: Overall appearance:  Healthy appearing, in no distress Head: Erythema and swelling of the lower frontal region and midface.  There is slight drainage from the right upper lid medial aspect.  There is no significant fluctuance.  Eyes appear healthy and the lids are minimally involved now. Ears: External ears look normal. Nose: External nose is healthy in appearance.  Neck: No palpable neck masses.  Studies Reviewed: Orbital CT    Assessment/Plan Facial cellulitis.  No obvious drainable abscess.  Based on the photograph from admission, she has greatly improved with IV antibiotics.  I would recommend continuing that and following the exam clinically.  Recommend warm compresses.  Recommend she try to massage the area several times daily to  encourage any spontaneous drainage.  She may follow-up as an outpatient in 1 or 2 weeks.  Serena ColonelJefry Saje Gallop 08/23/2019, 1:12 PM

## 2019-08-24 LAB — BASIC METABOLIC PANEL
Anion gap: 8 (ref 5–15)
BUN: 9 mg/dL (ref 6–20)
CO2: 28 mmol/L (ref 22–32)
Calcium: 8.8 mg/dL — ABNORMAL LOW (ref 8.9–10.3)
Chloride: 104 mmol/L (ref 98–111)
Creatinine, Ser: 0.56 mg/dL (ref 0.44–1.00)
GFR calc Af Amer: >60 mL/min (ref >60)
GFR calc non Af Amer: >60 mL/min (ref >60)
Glucose, Bld: 72 mg/dL (ref 70–99)
Potassium: 4 mmol/L (ref 3.5–5.1)
Sodium: 140 mmol/L (ref 135–145)

## 2019-08-24 LAB — CBC
HCT: 37.9 % (ref 36.0–46.0)
Hemoglobin: 11.8 g/dL — ABNORMAL LOW (ref 12.0–15.0)
MCH: 27.2 pg (ref 26.0–34.0)
MCHC: 31.1 g/dL (ref 30.0–36.0)
MCV: 87.3 fL (ref 80.0–100.0)
Platelets: 379 10*3/uL (ref 150–400)
RBC: 4.34 MIL/uL (ref 3.87–5.11)
RDW: 16.3 % — ABNORMAL HIGH (ref 11.5–15.5)
WBC: 9.5 10*3/uL (ref 4.0–10.5)
nRBC: 0 % (ref 0.0–0.2)
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
## 2019-08-24 NOTE — TOC Progression Note (Signed)
Transition of Care [facility]) - Progression Note    Patient Details  Name: Colleen Ramirez MRN: [id number] Date of Birth: Jan 21, 1981  Transition of Care [facility]) CM/SW [place], [place] Phone Number: 08/24/2019, 1:05 PM  Clinical Narrative:  Approached patient to ask her if she needed any help in anticipation of d/c tomorrow.  She stated she is trying to get hold of a friend to pick her up.  I asked her about a plan B.  She replied that she does not have one.  I reminded her of options I gave her, made sure she still has the paper with phone numbers, and wished her luck. TOC will continue to follow during the course of hospitalization.      Expected Discharge Plan: Home/Self Care Barriers to Discharge: No Barriers Identified  Expected Discharge Plan and Services Expected Discharge Plan: Home/Self Care In-house Referral: Clinical Social Work     Living arrangements for the past 2 months: Homeless                                       Social Determinants of Health ([place]) Interventions    Readmission Risk Interventions Readmission Risk Prevention Plan 08/24/2019  Post Dischage Appt Not Complete  Appt Comments unknown address at d/c  Medication Screening Complete  Transportation Screening Complete

## 2019-08-25 MED ORDER — DOXYCYCLINE HYCLATE 100 MG PO CAPS: 100.0000 mg | ORAL_CAPSULE | Freq: Two times a day (BID) | ORAL | 0 refills | Status: AC

## 2019-08-25 MED ORDER — CLINDAMYCIN PHOSPHATE 1 % EX GEL: CUTANEOUS | 0 refills | Status: DC

## 2019-08-25 MED ORDER — CLINDAMYCIN PHOSPHATE 1 % EX SOLN: Freq: Two times a day (BID) | CUTANEOUS | 1 refills | Status: DC

## 2019-08-25 MED ORDER — FLUOXETINE HCL 10 MG PO CAPS: 10.0000 mg | ORAL_CAPSULE | Freq: Every day | ORAL | 0 refills | Status: DC

## 2019-08-25 MED ORDER — HYDROXYZINE HCL 25 MG PO TABS: 25.0000 mg | ORAL_TABLET | Freq: Three times a day (TID) | ORAL | 0 refills | Status: DC | PRN

## 2019-08-25 MED FILL — CLINDAMYCIN PH 1% SOLUTION: 1 | 30 days supply | Qty: 60 | Fill #0

## 2019-08-25 MED FILL — FLUoxetine HCL 10 MG CAPS: 10 | 30 days supply | Qty: 30 | Fill #0

## 2019-08-25 MED FILL — DOXYCYCLINE HYC 100 MG CAPS: 100 | 10 days supply | Qty: 20 | Fill #0

## 2019-08-25 MED FILL — hydrOXYzine HCL 25 MG TABS: 25 | 10 days supply | Qty: 30 | Fill #0

## 2019-08-25 NOTE — TOC Transition Note (Signed)
Transition of Care [facility]) - CM/SW Discharge Note   Patient Details  Name: Colleen Ramirez MRN: [id number] Date of Birth: 11/29/1980  Transition of Care [facility]) CM/SW Contact:  Trish Mage, LCSW Phone Number: 08/25/2019, 11:12 AM   Clinical Narrative:  Patient to d/c today. Clothes from clothing closet given, cab voucher given, Medication delivered from [place].  TOC sign off.     Final next level of care: Home/Self Care Barriers to Discharge: No Barriers Identified   Patient Goals and CMS Choice Patient states their goals for this hospitalization and ongoing recovery are:: "I'm homeless, I need Drs and I need clothing."      Discharge Placement                       Discharge Plan and Services In-house Referral: Clinical Social Work                                   Social Determinants of Health (SDOH) Interventions     Readmission Risk Interventions Readmission Risk Prevention Plan 08/25/2019 08/24/2019  Post Dischage Appt - Not Complete  Appt Comments - unknown address at d/c  Medication Screening - Complete  Transportation Screening Complete Complete  PCP or Specialist Appt within 5-7 Days Not Complete -  Not Complete comments got the earliest appointment available at sickle cell clinic -  Home Care Screening Complete -  Medication Review (RN CM) Referral to Pharmacy -
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
## 2019-09-29 ENCOUNTER — Inpatient Hospital Stay (HOSPITAL_COMMUNITY): Payer: MEDICAID | Admitting: Anesthesiology

## 2019-09-29 ENCOUNTER — Inpatient Hospital Stay (HOSPITAL_COMMUNITY)
Admission: EM | Admit: 2019-09-29 | Discharge: 2019-12-11 | DRG: 853 | Disposition: A | Discharge status: Home / Self Care | Payer: Self-pay | Attending: Family Medicine | Admitting: Family Medicine

## 2019-09-29 ENCOUNTER — Encounter (HOSPITAL_COMMUNITY)
Admission: EM | Disposition: A | Discharge status: Home / Self Care | Payer: Self-pay | Source: Home / Self Care | Attending: Internal Medicine

## 2019-09-29 ENCOUNTER — Emergency Department (HOSPITAL_COMMUNITY): Payer: Self-pay

## 2019-09-29 ENCOUNTER — Inpatient Hospital Stay (HOSPITAL_COMMUNITY): Payer: Self-pay

## 2019-09-29 ENCOUNTER — Encounter (HOSPITAL_COMMUNITY): Payer: Self-pay

## 2019-09-29 DIAGNOSIS — G061 Intraspinal abscess and granuloma: Secondary | ICD-10-CM

## 2019-09-29 DIAGNOSIS — D638 Anemia in other chronic diseases classified elsewhere: Secondary | ICD-10-CM | POA: Diagnosis present

## 2019-09-29 DIAGNOSIS — M4322 Fusion of spine, cervical region: Secondary | ICD-10-CM | POA: Diagnosis not present

## 2019-09-29 DIAGNOSIS — Z98891 History of uterine scar from previous surgery: Secondary | ICD-10-CM

## 2019-09-29 DIAGNOSIS — M2578 Osteophyte, vertebrae: Secondary | ICD-10-CM | POA: Diagnosis present

## 2019-09-29 DIAGNOSIS — G4700 Insomnia, unspecified: Secondary | ICD-10-CM | POA: Diagnosis not present

## 2019-09-29 DIAGNOSIS — M25511 Pain in right shoulder: Secondary | ICD-10-CM

## 2019-09-29 DIAGNOSIS — I493 Ventricular premature depolarization: Secondary | ICD-10-CM | POA: Diagnosis present

## 2019-09-29 DIAGNOSIS — M4642 Discitis, unspecified, cervical region: Secondary | ICD-10-CM | POA: Diagnosis present

## 2019-09-29 DIAGNOSIS — T801XXA Vascular complications following infusion, transfusion and therapeutic injection, initial encounter: Secondary | ICD-10-CM

## 2019-09-29 DIAGNOSIS — I82613 Acute embolism and thrombosis of superficial veins of upper extremity, bilateral: Secondary | ICD-10-CM | POA: Diagnosis not present

## 2019-09-29 DIAGNOSIS — F17210 Nicotine dependence, cigarettes, uncomplicated: Secondary | ICD-10-CM | POA: Diagnosis present

## 2019-09-29 DIAGNOSIS — M4802 Spinal stenosis, cervical region: Secondary | ICD-10-CM | POA: Diagnosis present

## 2019-09-29 DIAGNOSIS — Z9889 Other specified postprocedural states: Secondary | ICD-10-CM

## 2019-09-29 DIAGNOSIS — A4102 Sepsis due to Methicillin resistant Staphylococcus aureus: Principal | ICD-10-CM | POA: Diagnosis present

## 2019-09-29 DIAGNOSIS — I82621 Acute embolism and thrombosis of deep veins of right upper extremity: Secondary | ICD-10-CM | POA: Diagnosis not present

## 2019-09-29 DIAGNOSIS — Z5900 Homelessness unspecified: Secondary | ICD-10-CM

## 2019-09-29 DIAGNOSIS — F32A Depression, unspecified: Secondary | ICD-10-CM | POA: Diagnosis present

## 2019-09-29 DIAGNOSIS — F1990 Other psychoactive substance use, unspecified, uncomplicated: Secondary | ICD-10-CM

## 2019-09-29 DIAGNOSIS — D509 Iron deficiency anemia, unspecified: Secondary | ICD-10-CM | POA: Diagnosis present

## 2019-09-29 DIAGNOSIS — R0981 Nasal congestion: Secondary | ICD-10-CM | POA: Diagnosis not present

## 2019-09-29 DIAGNOSIS — R509 Fever, unspecified: Secondary | ICD-10-CM

## 2019-09-29 DIAGNOSIS — M609 Myositis, unspecified: Secondary | ICD-10-CM | POA: Diagnosis present

## 2019-09-29 DIAGNOSIS — M546 Pain in thoracic spine: Secondary | ICD-10-CM

## 2019-09-29 DIAGNOSIS — M4622 Osteomyelitis of vertebra, cervical region: Secondary | ICD-10-CM | POA: Diagnosis present

## 2019-09-29 DIAGNOSIS — Z8619 Personal history of other infectious and parasitic diseases: Secondary | ICD-10-CM

## 2019-09-29 DIAGNOSIS — F1510 Other stimulant abuse, uncomplicated: Secondary | ICD-10-CM

## 2019-09-29 DIAGNOSIS — A4901 Methicillin susceptible Staphylococcus aureus infection, unspecified site: Secondary | ICD-10-CM | POA: Diagnosis present

## 2019-09-29 DIAGNOSIS — B1920 Unspecified viral hepatitis C without hepatic coma: Secondary | ICD-10-CM | POA: Diagnosis present

## 2019-09-29 DIAGNOSIS — R1310 Dysphagia, unspecified: Secondary | ICD-10-CM | POA: Diagnosis not present

## 2019-09-29 DIAGNOSIS — E871 Hypo-osmolality and hyponatremia: Secondary | ICD-10-CM | POA: Diagnosis present

## 2019-09-29 DIAGNOSIS — R52 Pain, unspecified: Secondary | ICD-10-CM

## 2019-09-29 DIAGNOSIS — L03211 Cellulitis of face: Secondary | ICD-10-CM | POA: Diagnosis present

## 2019-09-29 DIAGNOSIS — F988 Other specified behavioral and emotional disorders with onset usually occurring in childhood and adolescence: Secondary | ICD-10-CM | POA: Diagnosis present

## 2019-09-29 DIAGNOSIS — Z9884 Bariatric surgery status: Secondary | ICD-10-CM

## 2019-09-29 DIAGNOSIS — Z8711 Personal history of peptic ulcer disease: Secondary | ICD-10-CM

## 2019-09-29 DIAGNOSIS — A419 Sepsis, unspecified organism: Secondary | ICD-10-CM | POA: Diagnosis present

## 2019-09-29 DIAGNOSIS — F329 Major depressive disorder, single episode, unspecified: Secondary | ICD-10-CM | POA: Diagnosis present

## 2019-09-29 DIAGNOSIS — G8929 Other chronic pain: Secondary | ICD-10-CM | POA: Diagnosis present

## 2019-09-29 DIAGNOSIS — Z419 Encounter for procedure for purposes other than remedying health state, unspecified: Secondary | ICD-10-CM

## 2019-09-29 DIAGNOSIS — R768 Other specified abnormal immunological findings in serum: Secondary | ICD-10-CM | POA: Diagnosis present

## 2019-09-29 DIAGNOSIS — Z20822 Contact with and (suspected) exposure to covid-19: Secondary | ICD-10-CM | POA: Diagnosis present

## 2019-09-29 DIAGNOSIS — M542 Cervicalgia: Secondary | ICD-10-CM

## 2019-09-29 DIAGNOSIS — F419 Anxiety disorder, unspecified: Secondary | ICD-10-CM | POA: Diagnosis present

## 2019-09-29 HISTORY — PX: ANTERIOR CERVICAL DECOMP/DISCECTOMY FUSION: SHX1161

## 2019-09-29 LAB — CBC WITH DIFFERENTIAL/PLATELET
Abs Immature Granulocytes: 0.15 10*3/uL — ABNORMAL HIGH (ref 0.00–0.07)
Basophils Absolute: 0 10*3/uL (ref 0.0–0.1)
Basophils Relative: 0 %
Eosinophils Absolute: 0 10*3/uL (ref 0.0–0.5)
Eosinophils Relative: 0 %
HCT: 38.8 % (ref 36.0–46.0)
Hemoglobin: 12.5 g/dL (ref 12.0–15.0)
Immature Granulocytes: 1 %
Lymphocytes Relative: 6 %
Lymphs Abs: 0.8 10*3/uL (ref 0.7–4.0)
MCH: 27.9 pg (ref 26.0–34.0)
MCHC: 32.2 g/dL (ref 30.0–36.0)
MCV: 86.6 fL (ref 80.0–100.0)
Monocytes Absolute: 1.1 10*3/uL — ABNORMAL HIGH (ref 0.1–1.0)
Monocytes Relative: 8 %
Neutro Abs: 11.7 10*3/uL — ABNORMAL HIGH (ref 1.7–7.7)
Neutrophils Relative %: 85 %
Platelets: 316 10*3/uL (ref 150–400)
RBC: 4.48 MIL/uL (ref 3.87–5.11)
RDW: 16 % — ABNORMAL HIGH (ref 11.5–15.5)
WBC: 13.8 10*3/uL — ABNORMAL HIGH (ref 4.0–10.5)
nRBC: 0 % (ref 0.0–0.2)

## 2019-09-29 LAB — BASIC METABOLIC PANEL
Anion gap: 10 (ref 5–15)
BUN: 10 mg/dL (ref 6–20)
CO2: 25 mmol/L (ref 22–32)
Calcium: 8.8 mg/dL — ABNORMAL LOW (ref 8.9–10.3)
Chloride: 99 mmol/L (ref 98–111)
Creatinine, Ser: 0.49 mg/dL (ref 0.44–1.00)
GFR calc Af Amer: >60 mL/min (ref >60)
GFR calc non Af Amer: >60 mL/min (ref >60)
Glucose, Bld: 111 mg/dL — ABNORMAL HIGH (ref 70–99)
Potassium: 3.8 mmol/L (ref 3.5–5.1)
Sodium: 134 mmol/L — ABNORMAL LOW (ref 135–145)

## 2019-09-29 LAB — HCG, SERUM, QUALITATIVE: Preg, Serum: NEGATIVE

## 2019-09-29 LAB — SARS CORONAVIRUS 2 (TAT 6-24 HRS): SARS Coronavirus 2: NEGATIVE

## 2019-09-29 SURGERY — ANTERIOR CERVICAL DECOMPRESSION/DISCECTOMY FUSION 1 LEVEL
Anesthesia: General | Site: Neck

## 2019-09-29 MED ORDER — HEMOSTATIC AGENTS (NO CHARGE) OPTIME
TOPICAL | Status: DC | PRN
  Administered 2019-09-29: 1 via TOPICAL

## 2019-09-29 MED ORDER — BACITRACIN ZINC 500 UNIT/GM EX OINT
TOPICAL_OINTMENT | CUTANEOUS | Status: AC
  Filled 2019-09-29: qty 28.35

## 2019-09-29 MED ORDER — VANCOMYCIN HCL IN DEXTROSE 1-5 GM/200ML-% IV SOLN
INTRAVENOUS | Status: AC
  Filled 2019-09-29: qty 200

## 2019-09-29 MED ORDER — SODIUM CHLORIDE 0.9% FLUSH
3.0000 mL | Freq: Two times a day (BID) | INTRAVENOUS | Status: DC
  Administered 2019-09-30 – 2019-11-25 (×64): 3 mL via INTRAVENOUS

## 2019-09-29 MED ORDER — ACETAMINOPHEN 325 MG PO TABS
650.0000 mg | ORAL_TABLET | ORAL | Status: DC | PRN
  Administered 2019-09-30 – 2019-10-03 (×3): 650 mg via ORAL
  Filled 2019-09-29 (×3): qty 2

## 2019-09-29 MED ORDER — HYDROXYZINE HCL 25 MG PO TABS: 25.0000 mg | ORAL_TABLET | Freq: Three times a day (TID) | ORAL | Status: DC | PRN

## 2019-09-29 MED ORDER — FENTANYL CITRATE (PF) 100 MCG/2ML IJ SOLN
25.0000 ug | INTRAMUSCULAR | Status: DC | PRN
  Administered 2019-09-29 (×2): 50 ug via INTRAVENOUS

## 2019-09-29 MED ORDER — DEXAMETHASONE SODIUM PHOSPHATE 10 MG/ML IJ SOLN
INTRAMUSCULAR | Status: AC
  Filled 2019-09-29: qty 1

## 2019-09-29 MED ORDER — FENTANYL CITRATE (PF) 250 MCG/5ML IJ SOLN
INTRAMUSCULAR | Status: AC
  Filled 2019-09-29: qty 5

## 2019-09-29 MED ORDER — 0.9 % SODIUM CHLORIDE (POUR BTL) OPTIME
TOPICAL | Status: DC | PRN
  Administered 2019-09-29: 21:00:00 1000 mL

## 2019-09-29 MED ORDER — SUCCINYLCHOLINE CHLORIDE 200 MG/10ML IV SOSY
PREFILLED_SYRINGE | INTRAVENOUS | Status: AC
  Filled 2019-09-29: qty 10

## 2019-09-29 MED ORDER — THROMBIN 5000 UNITS EX SOLR
OROMUCOSAL | Status: DC | PRN
  Administered 2019-09-29: 21:00:00 5 mL via TOPICAL

## 2019-09-29 MED ORDER — MORPHINE SULFATE (PF) 4 MG/ML IV SOLN
4.0000 mg | Freq: Once | INTRAVENOUS | Status: AC
  Administered 2019-09-29: 18:00:00 4 mg via INTRAVENOUS
  Filled 2019-09-29: qty 1

## 2019-09-29 MED ORDER — OXYCODONE HCL 5 MG PO TABS: 5.0000 mg | ORAL_TABLET | ORAL | Status: DC | PRN

## 2019-09-29 MED ORDER — LIDOCAINE-EPINEPHRINE 1 %-1:100000 IJ SOLN
INTRAMUSCULAR | Status: AC
  Filled 2019-09-29: qty 1

## 2019-09-29 MED ORDER — VANCOMYCIN HCL 1000 MG IV SOLR
INTRAVENOUS | Status: DC | PRN
  Administered 2019-09-29: 23:00:00 1000 mg via INTRAVENOUS

## 2019-09-29 MED ORDER — LIDOCAINE HCL (CARDIAC) PF 100 MG/5ML IV SOSY
PREFILLED_SYRINGE | INTRAVENOUS | Status: DC | PRN
  Administered 2019-09-29: 40 mg via INTRATRACHEAL

## 2019-09-29 MED ORDER — PROPOFOL 10 MG/ML IV BOLUS
INTRAVENOUS | Status: DC | PRN
  Administered 2019-09-29: 170 mg via INTRAVENOUS

## 2019-09-29 MED ORDER — OXYCODONE HCL 5 MG/5ML PO SOLN: 5.0000 mg | Freq: Once | ORAL | Status: AC | PRN

## 2019-09-29 MED ORDER — ROCURONIUM BROMIDE 10 MG/ML (PF) SYRINGE
PREFILLED_SYRINGE | INTRAVENOUS | Status: AC
  Filled 2019-09-29: qty 10

## 2019-09-29 MED ORDER — OXYCODONE HCL 5 MG PO TABS
ORAL_TABLET | ORAL | Status: AC
  Filled 2019-09-29: qty 1

## 2019-09-29 MED ORDER — SUCCINYLCHOLINE 20MG/ML (10ML) SYRINGE FOR MEDFUSION PUMP - OPTIME
INTRAMUSCULAR | Status: DC | PRN
  Administered 2019-09-29: 120 mg via INTRAVENOUS

## 2019-09-29 MED ORDER — HYDROMORPHONE HCL 1 MG/ML IJ SOLN
1.0000 mg | INTRAMUSCULAR | Status: DC | PRN
  Administered 2019-09-30 – 2019-10-03 (×16): 1 mg via INTRAVENOUS
  Filled 2019-09-29 (×16): qty 1

## 2019-09-29 MED ORDER — THROMBIN 5000 UNITS EX SOLR
CUTANEOUS | Status: AC
  Filled 2019-09-29: qty 5000

## 2019-09-29 MED ORDER — ACETAMINOPHEN 650 MG RE SUPP: 650.0000 mg | RECTAL | Status: DC | PRN

## 2019-09-29 MED ORDER — ROCURONIUM 10MG/ML (10ML) SYRINGE FOR MEDFUSION PUMP - OPTIME
INTRAVENOUS | Status: DC | PRN
  Administered 2019-09-29: 20 mg via INTRAVENOUS
  Administered 2019-09-29: 50 mg via INTRAVENOUS
  Administered 2019-09-29: 10 mg via INTRAVENOUS

## 2019-09-29 MED ORDER — MIDAZOLAM HCL 2 MG/2ML IJ SOLN
INTRAMUSCULAR | Status: DC | PRN
  Administered 2019-09-29: 2 mg via INTRAVENOUS

## 2019-09-29 MED ORDER — ONDANSETRON HCL 4 MG/2ML IJ SOLN
INTRAMUSCULAR | Status: AC
  Filled 2019-09-29: qty 2

## 2019-09-29 MED ORDER — MIDAZOLAM HCL 2 MG/2ML IJ SOLN
INTRAMUSCULAR | Status: AC
  Filled 2019-09-29: qty 2

## 2019-09-29 MED ORDER — PHENOL 1.4 % MT LIQD: 1.0000 | OROMUCOSAL | Status: DC | PRN

## 2019-09-29 MED ORDER — LORAZEPAM 0.5 MG PO TABS
0.5000 mg | ORAL_TABLET | Freq: Once | ORAL | Status: AC
  Administered 2019-10-06: 0.5 mg via ORAL
  Filled 2019-09-29: qty 1

## 2019-09-29 MED ORDER — LACTATED RINGERS IV SOLN: INTRAVENOUS | Status: DC

## 2019-09-29 MED ORDER — POLYETHYLENE GLYCOL 3350 17 G PO PACK: 17.0000 g | PACK | Freq: Every day | ORAL | Status: DC | PRN

## 2019-09-29 MED ORDER — ACETAMINOPHEN 500 MG PO TABS
1000.0000 mg | ORAL_TABLET | Freq: Once | ORAL | Status: AC
  Administered 2019-09-29: 12:00:00 1000 mg via ORAL
  Filled 2019-09-29: qty 2

## 2019-09-29 MED ORDER — OXYCODONE HCL 5 MG PO TABS
5.0000 mg | ORAL_TABLET | Freq: Once | ORAL | Status: AC | PRN
  Administered 2019-09-29: 23:00:00 5 mg via ORAL

## 2019-09-29 MED ORDER — SUGAMMADEX SODIUM 200 MG/2ML IV SOLN
INTRAVENOUS | Status: DC | PRN
  Administered 2019-09-29: 200 mg via INTRAVENOUS

## 2019-09-29 MED ORDER — PIPERACILLIN-TAZOBACTAM 3.375 G IVPB
3.3750 g | INTRAVENOUS | Status: AC
  Administered 2019-09-29: 3.375 g via INTRAVENOUS
  Filled 2019-09-29: qty 50

## 2019-09-29 MED ORDER — DEXAMETHASONE SODIUM PHOSPHATE 10 MG/ML IJ SOLN
INTRAMUSCULAR | Status: DC | PRN
  Administered 2019-09-29: 10 mg via INTRAVENOUS

## 2019-09-29 MED ORDER — FENTANYL CITRATE (PF) 100 MCG/2ML IJ SOLN
INTRAMUSCULAR | Status: AC
  Filled 2019-09-29: qty 4

## 2019-09-29 MED ORDER — CYCLOBENZAPRINE HCL 10 MG PO TABS
10.0000 mg | ORAL_TABLET | Freq: Three times a day (TID) | ORAL | Status: DC | PRN
  Administered 2019-09-30 – 2019-12-11 (×47): 10 mg via ORAL
  Filled 2019-09-29 (×53): qty 1

## 2019-09-29 MED ORDER — ONDANSETRON HCL 4 MG/2ML IJ SOLN: 4.0000 mg | Freq: Once | INTRAMUSCULAR | Status: DC | PRN

## 2019-09-29 MED ORDER — LIDOCAINE-EPINEPHRINE 1 %-1:100000 IJ SOLN
INTRAMUSCULAR | Status: DC | PRN
  Administered 2019-09-29: 6 mL via INTRADERMAL

## 2019-09-29 MED ORDER — PROPOFOL 10 MG/ML IV BOLUS
INTRAVENOUS | Status: AC
  Filled 2019-09-29: qty 20

## 2019-09-29 MED ORDER — GADOBUTROL 1 MMOL/ML IV SOLN
5.0000 mL | Freq: Once | INTRAVENOUS | Status: AC | PRN
  Administered 2019-09-29: 17:00:00 5 mL via INTRAVENOUS

## 2019-09-29 MED ORDER — SODIUM CHLORIDE 0.9 % IV SOLN
INTRAVENOUS | Status: DC | PRN
  Administered 2019-09-29: 500 mL

## 2019-09-29 MED ORDER — SODIUM CHLORIDE 0.9% FLUSH: 3.0000 mL | INTRAVENOUS | Status: DC | PRN

## 2019-09-29 MED ORDER — MENTHOL 3 MG MT LOZG: 1.0000 | LOZENGE | OROMUCOSAL | Status: DC | PRN

## 2019-09-29 MED ORDER — ONDANSETRON HCL 4 MG PO TABS: 4.0000 mg | ORAL_TABLET | Freq: Four times a day (QID) | ORAL | Status: DC | PRN

## 2019-09-29 MED ORDER — SODIUM CHLORIDE 0.9 % IV BOLUS
1000.0000 mL | Freq: Once | INTRAVENOUS | Status: AC
  Administered 2019-09-29: 18:00:00 1000 mL via INTRAVENOUS

## 2019-09-29 MED ORDER — SODIUM CHLORIDE 0.9 % IV SOLN
250.0000 mL | INTRAVENOUS | Status: DC
  Administered 2019-09-30 – 2019-11-17 (×4): 250 mL via INTRAVENOUS

## 2019-09-29 MED ORDER — OXYCODONE HCL 5 MG PO TABS
10.0000 mg | ORAL_TABLET | ORAL | Status: DC | PRN
  Administered 2019-09-30 – 2019-10-01 (×5): 10 mg via ORAL
  Filled 2019-09-29 (×6): qty 2

## 2019-09-29 MED ORDER — DOCUSATE SODIUM 100 MG PO CAPS: 100.0000 mg | ORAL_CAPSULE | Freq: Two times a day (BID) | ORAL | Status: DC

## 2019-09-29 MED ORDER — DEXMEDETOMIDINE HCL IN NACL 200 MCG/50ML IV SOLN
INTRAVENOUS | Status: DC | PRN
  Administered 2019-09-29 (×7): 8 ug via INTRAVENOUS

## 2019-09-29 MED ORDER — ONDANSETRON HCL 4 MG/2ML IJ SOLN
INTRAMUSCULAR | Status: DC | PRN
  Administered 2019-09-29: 4 mg via INTRAVENOUS

## 2019-09-29 MED ORDER — FENTANYL CITRATE (PF) 250 MCG/5ML IJ SOLN
INTRAMUSCULAR | Status: DC | PRN
  Administered 2019-09-29: 100 ug via INTRAVENOUS
  Administered 2019-09-29: 50 ug via INTRAVENOUS
  Administered 2019-09-29 (×2): 100 ug via INTRAVENOUS
  Administered 2019-09-29: 50 ug via INTRAVENOUS

## 2019-09-29 MED ORDER — ONDANSETRON HCL 4 MG/2ML IJ SOLN: 4.0000 mg | Freq: Four times a day (QID) | INTRAMUSCULAR | Status: DC | PRN

## 2019-09-29 MED ORDER — LIDOCAINE 2% (20 MG/ML) 5 ML SYRINGE
INTRAMUSCULAR | Status: AC
  Filled 2019-09-29: qty 5

## 2019-09-29 MED ORDER — DEXMEDETOMIDINE HCL IN NACL 200 MCG/50ML IV SOLN
INTRAVENOUS | Status: AC
  Filled 2019-09-29: qty 50

## 2019-09-29 SURGICAL SUPPLY — 53 items
BAG DECANTER FOR FLEXI CONT (MISCELLANEOUS) ×2 IMPLANT
BENZOIN TINCTURE PRP APPL 2/3 (GAUZE/BANDAGES/DRESSINGS) IMPLANT
BLADE CLIPPER SURG (BLADE) IMPLANT
BLADE SURG 11 STRL SS (BLADE) ×2 IMPLANT
BUR MATCHSTICK NEURO 3.0 LAGG (BURR) ×2 IMPLANT
CANISTER SUCT 3000ML PPV (MISCELLANEOUS) ×2 IMPLANT
COVER WAND RF STERILE (DRAPES) ×2 IMPLANT
DECANTER SPIKE VIAL GLASS SM (MISCELLANEOUS) ×2 IMPLANT
DERMABOND ADVANCED (GAUZE/BANDAGES/DRESSINGS) ×1
DERMABOND ADVANCED .7 DNX12 (GAUZE/BANDAGES/DRESSINGS) ×1 IMPLANT
DRAPE C-ARM 42X72 X-RAY (DRAPES) ×4 IMPLANT
DRAPE HALF SHEET 40X57 (DRAPES) IMPLANT
DRAPE LAPAROTOMY 100X72 PEDS (DRAPES) ×2 IMPLANT
DRAPE MICROSCOPE LEICA (MISCELLANEOUS) ×2 IMPLANT
DURAPREP 6ML APPLICATOR 50/CS (WOUND CARE) ×2 IMPLANT
ELECT COATED BLADE 2.86 ST (ELECTRODE) ×2 IMPLANT
ELECT REM PT RETURN 9FT ADLT (ELECTROSURGICAL) ×2
ELECTRODE REM PT RTRN 9FT ADLT (ELECTROSURGICAL) ×1 IMPLANT
GAUZE 4X4 16PLY RFD (DISPOSABLE) IMPLANT
GLOVE BIO SURGEON STRL SZ7 (GLOVE) ×2 IMPLANT
GLOVE BIO SURGEON STRL SZ7.5 (GLOVE) ×2 IMPLANT
GLOVE BIOGEL PI IND STRL 7.5 (GLOVE) ×3 IMPLANT
GLOVE BIOGEL PI INDICATOR 7.5 (GLOVE) ×3
GLOVE EXAM NITRILE LRG STRL (GLOVE) IMPLANT
GLOVE EXAM NITRILE XL STR (GLOVE) IMPLANT
GLOVE EXAM NITRILE XS STR PU (GLOVE) IMPLANT
GOWN STRL REUS W/ TWL LRG LVL3 (GOWN DISPOSABLE) ×2 IMPLANT
GOWN STRL REUS W/ TWL XL LVL3 (GOWN DISPOSABLE) IMPLANT
GOWN STRL REUS W/TWL 2XL LVL3 (GOWN DISPOSABLE) IMPLANT
GOWN STRL REUS W/TWL LRG LVL3 (GOWN DISPOSABLE) ×2
GOWN STRL REUS W/TWL XL LVL3 (GOWN DISPOSABLE)
HEMOSTAT POWDER KIT SURGIFOAM (HEMOSTASIS) ×2 IMPLANT
KIT BASIN OR (CUSTOM PROCEDURE TRAY) ×2 IMPLANT
KIT TURNOVER KIT B (KITS) ×2 IMPLANT
NEEDLE HYPO 22GX1.5 SAFETY (NEEDLE) ×2 IMPLANT
NEEDLE SPNL 18GX3.5 QUINCKE PK (NEEDLE) ×2 IMPLANT
NS IRRIG 1000ML POUR BTL (IV SOLUTION) ×2 IMPLANT
PACK LAMINECTOMY NEURO (CUSTOM PROCEDURE TRAY) ×2 IMPLANT
PAD ARMBOARD 7.5X6 YLW CONV (MISCELLANEOUS) ×6 IMPLANT
PIN DISTRACTION 14MM (PIN) IMPLANT
PLATE ELITE 21MM (Plate) ×2 IMPLANT
RUBBERBAND STERILE (MISCELLANEOUS) ×4 IMPLANT
SCREW SELF TAP VAR 4.0X13 (Screw) ×8 IMPLANT
SPACER BONE CORNERSTONE 6X14 (Orthopedic Implant) ×2 IMPLANT
SPONGE INTESTINAL PEANUT (DISPOSABLE) ×2 IMPLANT
SPONGE SURGIFOAM ABS GEL SZ50 (HEMOSTASIS) IMPLANT
STAPLER VISISTAT 35W (STAPLE) IMPLANT
SUT MNCRL AB 3-0 PS2 18 (SUTURE) ×2 IMPLANT
SUT VIC AB 3-0 SH 8-18 (SUTURE) ×2 IMPLANT
TAPE CLOTH 3X10 TAN LF (GAUZE/BANDAGES/DRESSINGS) ×2 IMPLANT
TOWEL GREEN STERILE (TOWEL DISPOSABLE) ×2 IMPLANT
TOWEL GREEN STERILE FF (TOWEL DISPOSABLE) ×2 IMPLANT
WATER STERILE IRR 1000ML POUR (IV SOLUTION) ×2 IMPLANT

## 2019-09-29 NOTE — TOC Initial Note (Addendum)
Transition of Care [facility]) - Initial/Assessment Note    Patient Details  Name: Colleen Ramirez MRN: [id number] Date of Birth: [birth year]  [facility] CM/SW Contact:    Elliot Cousin, RN Phone Number: [phone] 09/29/2019, 4:56 PM  Clinical Narrative:                 TOC CM spoke to pt and states she has been to a outpt treatment center in the past. Provided pt with list of outpt substance abuse treatment centers. Explained MATCH/Procare with $0 copay and once per year use. Pt is currently homeless. States she does not have insurance and funds to pay for meds. She is to [facility] with [facility] on 11/17/2019. Pt will need an earlier appt.   Pt scheduled for admission, will need to have MATCH reinstated at dc. New TOC consult entered.   Expected Discharge Plan: Home/Self Care Barriers to Discharge: No Barriers Identified   Patient Goals and CMS Choice        Expected Discharge Plan and Services Expected Discharge Plan: Home/Self Care In-house Referral: Clinical Social Work Discharge Planning Services: CM Consult   Living arrangements for the past 2 months: Homeless                                      Prior Living Arrangements/Services Living arrangements for the past 2 months: Homeless Lives with:: Self Patient language and need for interpreter reviewed:: Yes Do you feel safe going back to the place where you live?: Yes      Need for Family Participation in Patient Care: No (Comment) Care giver support system in place?: No (comment)   Criminal Activity/Legal Involvement Pertinent to Current Situation/Hospitalization: No - Comment as needed  Activities of Daily Living      Permission Sought/Granted Permission sought to share information with : Case Manager, PCP, Family Supports Permission granted to share information with : Yes, Verbal Permission Granted              Emotional Assessment Appearance:: Appears stated  age Attitude/Demeanor/Rapport: Engaged Affect (typically observed): Accepting Orientation: : Oriented to Self, Oriented to  Time, Oriented to Place, Oriented to Situation Alcohol / Substance Use: Alcohol Use, Illicit Drugs Psych Involvement: No (comment)  Admission diagnosis:  muscle aches Patient Active Problem List   Diagnosis Date Noted  . Cellulitis 08/19/2019  . Facial cellulitis 08/18/2019  . Sepsis (HCC) 07/24/2019  . Anemia   . Depression   . Drug abuse (HCC)   . Enteritis    PCP:  Patient, No Pcp Per Pharmacy:   [id number] [place] [place] - [address] AT [facility] [address] [place] [address] Phone: [phone] Fax: [phone]  [facility] - [place], [place] - [address] [place] [address] [place] [address] Phone: [phone] Fax: [phone]     Social Determinants of Health (SDOH) Interventions    Readmission Risk Interventions Readmission Risk Prevention Plan 08/25/2019 08/24/2019  Post Dischage Appt - Not Complete  Appt Comments - unknown address at d/c  Medication Screening - Complete  Transportation Screening Complete Complete  PCP or Specialist Appt within 5-7 Days Not Complete -  Not Complete comments got the earliest appointment available at sickle cell clinic -  Home Care Screening Complete -  Medication Review (RN CM) Referral to Pharmacy -

## 2019-09-29 NOTE — Transfer of Care (Signed)
Immediate Anesthesia Transfer of Care Note  Patient: Colleen Ramirez  Procedure(s) Performed: CERVICAL FIVE TO CERVICALSIX ANTERIOR DISCECTOMY AND INSTRUMENTED FUSION (N/A Neck)  Patient Location: PACU  Anesthesia Type:General  Level of Consciousness: awake, alert  and oriented  Airway & Oxygen Therapy: Patient Spontanous Breathing  Post-op Assessment: Report given to RN and Post -op Vital signs reviewed and stable  Post vital signs: Reviewed and stable  Last Vitals:  Vitals Value Taken Time  BP 111/71 09/29/19 2256  Temp 37 C 09/29/19 2254  Pulse 117 09/29/19 2300  Resp 15 09/29/19 2300  SpO2 98 % 09/29/19 2300  Vitals shown include unvalidated device data.  Last Pain:  Vitals:   09/29/19 1845  TempSrc:   PainSc: 7          Complications: No apparent anesthesia complications

## 2019-09-29 NOTE — ED Notes (Signed)
Social work at bedside.  

## 2019-09-29 NOTE — Op Note (Signed)
PATIENT: Colleen Ramirez  PROCEDURE DATE: 09/29/19  PRE-OPERATIVE DIAGNOSIS:  Cervical epidural abscess   POST-OPERATIVE DIAGNOSIS:  Cervical epidural abscess   PROCEDURE:  C5-C6 Anterior Cervical Discectomy and Instrumented Fusion   SURGEON:  Surgeon(s) and Role:    Jadene Pierini, MD - Primary   ANESTHESIA: ETGA   BRIEF HISTORY: This is a 39 year old woman w/ a h/o IVDU who presented with severe neck pain. The patient was found to have a ventral cervical epidural abscess due to discitis with resulting spinal cord compression. This was discussed with the patient as well as risks, benefits, and alternatives and the patient wished to proceed with surgical treatment.   OPERATIVE DETAIL: The patient was taken to the operating room and placed on the OR table in the supine position. A formal time out was performed with two patient identifiers and confirmed the operative site. Anesthesia was induced by the anesthesia team.  Fluoroscopy was used to localize the surgical level and an incision was marked in a skin crease. The area was then prepped and draped in a sterile fashion. A transverse linear incision was made on the right side of the neck. The platysma was divided and the sternocleidomastoid muscle was identified. The carotid sheath was palpated, identified, and retracted laterally with the sternocleidomastoid muscle. There was significant myositis and inflammation along the entire depth of dissection. The strap muscles were identified and retracted medially and the pretracheal fascia was entered. Purulent material was immediately encountered and cultured, at which time antibiotics were given. A bent spinal needle was used with fluoroscopy to localize the surgical level after dissection. The longus colli were elevated bilaterally and a self-retaining retractor was placed. The longus colli were significantly inflamed and friable. The endotracheal tube cuff balloon was deflated and reinflated after  retractor placement.   Anterior osteophytes were removed until flush with the anterior vertebral body. The purulent material was followed into the disc space and a C5-C6 discectomy was performed. After completion of the discectomy and endplate preparation, copious irrigation was used to wash out the disc space and, as much as technically possible, the epidural space was irrigated. There was significant phelgmon in the epidural space, which was debrided as best as possible. A 30mm cortical allograft (Medtronic) was then inserted into the disc space as an interbody graft. An anterior plate (Medtronic) was positioned and 4, 81mm screws were used to secure the plate to the C5 and C6 vertebral bodies. Hemostasis was obtained and the incision was closed in layers. All instrument and sponge counts were correct. The patient was then returned to anesthesia for emergence. No apparent complications at the completion of the procedure.   EBL:    DRAINS: none   SPECIMENS: Prevertebral and epidural pus, sent for aerobic and anaerobic cultures   Jadene Pierini, MD 09/29/19 8:43 PM

## 2019-09-29 NOTE — ED Notes (Signed)
[facility] called for transport. 

## 2019-09-29 NOTE — ED Notes (Signed)
Patient transported to MRI 

## 2019-09-29 NOTE — ED Provider Notes (Signed)
Quickly evaluated patient prior to her going to PACU. Pt seen at [facility] earlier today with complaints of generalized body pain specifically in neck and upper back x 2 days with hx of drug use - last smoked methamphetamine 2 days ago. MRIs obtained prior to being transferred with discitis at C5-C6 and C6-C7 with leukocytosis of 13,000. Covid negative. Neurosurgery with plan for admission. Neurosurgery aware that pt is here and she is ready for transport to PACU.    Tanda Rockers, PA-C 09/29/19 2008    Tegeler, Canary Brim, MD 09/29/19 2352

## 2019-09-29 NOTE — Discharge Instructions (Addendum)
Information on my medicine - XARELTO (rivaroxaban)  This medication education was reviewed with me or my healthcare representative as part of my discharge preparation.  The pharmacist that spoke with me during my hospital stay was:  Arman Filter, [facility]  WHY WAS Carlena Hurl PRESCRIBED FOR YOU? Xarelto was prescribed to treat blood clots that may have been found in the veins of your legs (deep vein thrombosis) or in your lungs (pulmonary embolism) and to reduce the risk of them occurring again.  What do you need to know about Xarelto? The starting dose is one 15 mg tablet taken TWICE daily with food for the FIRST 21 DAYS then on 11/24/19  the dose is changed to one 20 mg tablet taken ONCE A DAY with your evening meal.  DO NOT stop taking Xarelto without talking to the health care provider who prescribed the medication.  Refill your prescription for 20 mg tablets before you run out.  After discharge, you should have regular check-up appointments with your healthcare provider that is prescribing your Xarelto.  In the future your dose may need to be changed if your kidney function changes by a significant amount.  What do you do if you miss a dose? If you are taking Xarelto TWICE DAILY and you miss a dose, take it as soon as you remember. You may take two 15 mg tablets (total 30 mg) at the same time then resume your regularly scheduled 15 mg twice daily the next day.  If you are taking Xarelto ONCE DAILY and you miss a dose, take it as soon as you remember on the same day then continue your regularly scheduled once daily regimen the next day. Do not take two doses of Xarelto at the same time.   Important Safety Information Xarelto is a blood thinner medicine that can cause bleeding. You should call your healthcare provider right away if you experience any of the following: ? Bleeding from an injury or your nose that does not stop. ? Unusual colored urine (red or dark brown) or unusual  colored stools (red or black). ? Unusual bruising for unknown reasons. ? A serious fall or if you hit your head (even if there is no bleeding).  Some medicines may interact with Xarelto and might increase your risk of bleeding while on Xarelto. To help avoid this, consult your healthcare provider or pharmacist prior to using any new prescription or non-prescription medications, including herbals, vitamins, non-steroidal anti-inflammatory drugs (NSAIDs) and supplements.  This website has more information on Xarelto: VisitDestination.com.br.

## 2019-09-29 NOTE — ED Notes (Signed)
[facility] ED Charge made aware of pt transport to [facility] ED

## 2019-09-29 NOTE — ED Triage Notes (Signed)
Pt BIBA from parking lot. Pt smoked meth 2 days ago . Pt states the meth has crystallized into her muscles and she is having generalized pain. Pt slept throughout EMS transport.

## 2019-09-29 NOTE — Anesthesia Procedure Notes (Signed)
Procedure Name: Intubation Date/Time: 09/29/2019 8:55 PM Performed by: Molli Hazard, CRNA Pre-anesthesia Checklist: Patient identified, Emergency Drugs available, Suction available and Patient being monitored Patient Re-evaluated:Patient Re-evaluated prior to induction Oxygen Delivery Method: Circle system utilized Preoxygenation: Pre-oxygenation with 100% oxygen Induction Type: IV induction, Rapid sequence and Cricoid Pressure applied Laryngoscope Size: Miller and 2 Grade View: Grade I Tube size: 7.5 mm Number of attempts: 1 Airway Equipment and Method: Stylet Placement Confirmation: ETT inserted through vocal cords under direct vision,  positive ETCO2 and breath sounds checked- equal and bilateral Secured at: 22 cm Tube secured with: Tape Dental Injury: Teeth and Oropharynx as per pre-operative assessment

## 2019-09-29 NOTE — Brief Op Note (Signed)
09/29/2019  10:46 PM  PATIENT:  Colleen Ramirez  39 y.o. female  PRE-OPERATIVE DIAGNOSIS:  Cervical epidural abscess  POST-OPERATIVE DIAGNOSIS:  Cervical epidural abscess  PROCEDURE:  Procedure(s): CERVICAL FIVE TO CERVICALSIX ANTERIOR DISCECTOMY AND INSTRUMENTED FUSION (N/A)  SURGEON:  Surgeon(s) and Role:    * Refoel Palladino, Clovis Pu, MD - Primary  PHYSICIAN ASSISTANT:   ASSISTANTS: none   ANESTHESIA:   general  EBL:  75cc  BLOOD ADMINISTERED:none  DRAINS: none   LOCAL MEDICATIONS USED:  LIDOCAINE   SPECIMEN:  Source of Specimen:  Cultures x2  DISPOSITION OF SPECIMEN:  Microbiology  COUNTS:  YES  TOURNIQUET:  * No tourniquets in log *  DICTATION: .Note written in EPIC  PLAN OF CARE: Admit to inpatient   PATIENT DISPOSITION:  PACU - hemodynamically stable.   Delay start of Pharmacological VTE agent (>24hrs) due to surgical blood loss or risk of bleeding: yes

## 2019-09-29 NOTE — ED Provider Notes (Signed)
[facility]-EMERGENCY DEPT Provider Note   CSN: [id number] Arrival date & time: 09/29/19  1038     History Chief Complaint  Patient presents with  . Generalized Body Aches    Colleen Ramirez is a 39 y.o. female.  HPI  39 year old female with history of gastric bypass surgery, polysubstance abuse, anemia and anxiety/depression who was following a psychiatrist in [place], now homeless presented with facial swelling and rash.  Patient presents today for generalized body aches most severe in her bilateral shoulders, neck, back, chest and bilateral arms.  States that she has had aches for 2 days ever since she did meth intranasally.  She was brought in by ambulance from parking lot this morning.  States that she is living at [facility].  States any IV drug use in the past month but states she has a history of such.  Denies any known history of HIV states she does have a history of anemia.  Patient was discharged after 1 week hospitalization for face cellulitis    Patient states her chest pain is diffuse nonexertional nonpleuritic and achy in character with no accompanied fever, cough, diaphoresis, nausea does not radiate.  Patient was discharged in hospital on doxycycline 100 mg twice daily for 10 days and clindamycin 1 % external solution but states that she did not take these medications if she was unable to afford them.     Past Medical History:  Diagnosis Date  . Anemia   . Depression   . Drug abuse [facility])     Patient Active Problem List   Diagnosis Date Noted  . Cellulitis 08/19/2019  . Facial cellulitis 08/18/2019  . Sepsis (HCC) 07/24/2019  . Anemia   . Depression   . Drug abuse (HCC)   . Enteritis     Past Surgical History:  Procedure Laterality Date  . CESAREAN SECTION    . GASTRIC BYPASS       OB History   No obstetric history on file.     History reviewed. No pertinent family history.  Social History   Tobacco Use  . Smoking status:  Current Every Day Smoker    Packs/day: 0.50    Years: 34.00    Pack years: 17.00    Types: Cigarettes  . Smokeless tobacco: Never Used  Substance Use Topics  . Alcohol use: Yes  . Drug use: Yes    Types: IV, Methamphetamines    Home Medications Prior to Admission medications   Medication Sig Start Date End Date Taking? Authorizing Provider  clindamycin (CLEOCIN-T) 1 % external solution Apply topically 2 (two) times daily. Apply to forehead or affected area, twice a day for 10 days or until improved. Patient not taking: Reported on 09/29/2019 08/25/19   Rai, Delene Ruffini, MD  FLUoxetine (PROZAC) 10 MG capsule Take 1 capsule (10 mg total) by mouth daily. Patient not taking: Reported on 09/29/2019 08/25/19   Cathren Harsh, MD  hydrOXYzine (ATARAX/VISTARIL) 25 MG tablet Take 1 tablet (25 mg total) by mouth 3 (three) times daily as needed for itching or anxiety. Patient not taking: Reported on 09/29/2019 08/25/19   Cathren Harsh, MD    Allergies    Patient has no known allergies.  Review of Systems   Review of Systems  Constitutional: Positive for fatigue. Negative for chills and fever.  HENT: Negative for congestion.   Eyes: Negative for pain.  Respiratory: Negative for cough and shortness of breath.   Cardiovascular: Positive for chest pain.  Negative for leg swelling.  Gastrointestinal: Negative for abdominal pain, nausea and vomiting.  Genitourinary: Negative for dysuria.  Musculoskeletal: Positive for back pain, myalgias and neck pain. Negative for neck stiffness.  Skin: Positive for rash (facial).  Neurological: Negative for dizziness and headaches.    Physical Exam Updated Vital Signs BP 117/78   Pulse 95   Temp 98.2 F (36.8 C) (Oral)   Resp 18   SpO2 100%   Physical Exam Vitals and nursing note reviewed.  Constitutional:      General: She is not in acute distress.    Appearance: She is not ill-appearing or toxic-appearing.     Comments: No acute distress.  Thin,  chronically ill-appearing.  HENT:     Head: Normocephalic and atraumatic.     Nose: Nose normal.  Eyes:     General: No scleral icterus. Cardiovascular:     Rate and Rhythm: Normal rate and regular rhythm.     Pulses: Normal pulses.     Heart sounds: Normal heart sounds.     Comments: Pulse 84 on my exam Pulmonary:     Effort: Pulmonary effort is normal. No respiratory distress.     Breath sounds: No wheezing.  Abdominal:     Palpations: Abdomen is soft.     Tenderness: There is no abdominal tenderness.  Musculoskeletal:       Arms:     Cervical back: Normal range of motion. Tenderness present.       Back:     Right lower leg: No edema.     Left lower leg: No edema.     Comments: Patient has muscular tenderness over the indicated areas of the right shoulder and right upper back.  Also there vertebral muscular tenderness of the lower back bilaterally with no midline tenderness to palpation.  Tenderness of the musculature of the neck as well.  No midline tenderness of the neck.  Full range of motion of neck and back.  She is ambulatory and has no pain with palpation of joints of upper or lower extremity.  No reproducible chest wall tenderness.  No purulent discharge or TTP  Skin:    General: Skin is warm and dry.     Capillary Refill: Capillary refill takes less than 2 seconds.     Comments: Patient has dry scaly skin with mild erythema between her eyebrows.  Extends down when she is ready for nose and laterally towards both is without extending to the eyelids.  Skin is flaky.  Neurological:     Mental Status: She is alert. Mental status is at baseline.  Psychiatric:     Comments: Patient with bizarre affect, slow to answer questions seems avoidant, is using cell phone during majority of encounter.  Does not appear to be in any acute distress--repeatedly asked for blanket and for the lights to be turned out     ED Results / Procedures / Treatments   Labs (all labs ordered are  listed, but only abnormal results are displayed) Labs Reviewed  BASIC METABOLIC PANEL - Abnormal; Notable for the following components:      Result Value   Sodium 134 (*)    Glucose, Bld 111 (*)    Calcium 8.8 (*)    All other components within normal limits  CBC WITH DIFFERENTIAL/PLATELET - Abnormal; Notable for the following components:   WBC 13.8 (*)    RDW 16.0 (*)    Neutro Abs 11.7 (*)    Monocytes Absolute 1.1 (*)  Abs Immature Granulocytes 0.15 (*)    All other components within normal limits  SARS CORONAVIRUS 2 (TAT 6-24 HRS)    EKG EKG Interpretation  Date/Time:  Tuesday September 29 2019 11:56:00 EST Ventricular Rate:  105 PR Interval:    QRS Duration: 110 QT Interval:  331 QTC Calculation: 438 R Axis:   85 Text Interpretation: Sinus tachycardia Ventricular premature complex Aberrant complex Confirmed by Madalyn Rob [phone]) on 09/29/2019 12:00:12 PM   Radiology MR Cervical Spine W or Wo Contrast  Result Date: 09/29/2019 CLINICAL DATA:  Intravenous drug abuse.  Back pain. EXAM: MRI CERVICAL SPINE WITHOUT AND WITH CONTRAST TECHNIQUE: Multiplanar and multiecho pulse sequences of the cervical spine, to include the craniocervical junction and cervicothoracic junction, were obtained without and with intravenous contrast. CONTRAST:  78mL GADAVIST GADOBUTROL 1 MMOL/ML IV SOLN COMPARISON:  None. FINDINGS: Alignment: Straightening of the normal cervical lordosis. Vertebrae: Evidence of discitis osteomyelitis at C5-6 and C6-7. Abnormal low T1 signal of the marrow. Cannot rule out early involvement at C4-5. Surrounding phlegmonous soft tissue inflammatory changes including a prevertebral/retropharyngeal flag min and a ventral epidural fleck min. Small nonenhancing areas both in the anterior epidural region and in the prevertebral soft tissues represent early abscess. Cord: Spinal stenosis in the region from C4-C7 because of the epidural inflammation resulting in effacement of the  subarachnoid space and slight deformity of the cord. Posterior Fossa, vertebral arteries, paraspinal tissues: No posterior fossa abnormality is seen. See above for extensive soft tissue inflammatory changes of the neck. Disc levels: As noted above, discitis osteomyelitis at C5-6 and C6-7 with possible or early involvement at C4-5. Pronounced epidural phlegmonous inflammation from C3-T1 with some early nonenhancing foci consistent with early epidural abscess at the C5-6 and C6-7 levels. 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 Pronounced prevertebral/retropharyngeal soft tissue phlegmonous inflammation with areas of nonenhancing material consistent with pus. Epidural phlegmonous inflammation from C3-T1, with a few small areas of nonenhancing material particularly at the C5-6 and C6-7 levels consistent with early epidural abscess. Spinal stenosis in the region from C4-C7 because of this, with effacement of the subarachnoid space and some cord deformity. Electronically Signed   By: Nelson Chimes M.D.   On: 09/29/2019 16:58   MR THORACIC SPINE W WO CONTRAST  Result Date: 09/29/2019 CLINICAL DATA:  Intravenous drug abuser.  Severe back pain. EXAM: MRI THORACIC WITHOUT AND WITH CONTRAST TECHNIQUE: Multiplanar and multiecho pulse sequences of the thoracic spine were obtained without and with intravenous contrast. CONTRAST:  12mL GADAVIST GADOBUTROL 1 MMOL/ML IV SOLN COMPARISON:  Cervical study same day FINDINGS: MRI THORACIC SPINE FINDINGS Alignment:  Normal Vertebrae: No evidence spinal infection in the thoracic region. Probable incidental hemangioma within the right pedicle of T5. Cord:  Normal in the thoracic region. Paraspinal and other soft tissues: Normal in the thoracic region. Disc levels: Small chronic central disc herniation at T8-9. No neural compressive stenosis. Other levels negative. Patient does have some low level epidural inflammation extending all  the way down to the midthoracic region, particularly posteriorly, but without frank epidural abscess at this time. IMPRESSION: No evidence of primary spinal infection in the thoracic region. Epidural inflammation does extend into the thoracic region particularly posteriorly, as far as the midthoracic region. No evidence of a frank thoracic epidural abscess however. See cervical report for advanced spinal infection in that region. Electronically Signed   By: Nelson Chimes M.D.   On: 09/29/2019 17:03   DG  Chest Port 1 View  Result Date: 09/29/2019 CLINICAL DATA:  Chest pain EXAM: PORTABLE CHEST 1 VIEW COMPARISON:  None. FINDINGS: Lungs are clear. Heart size and pulmonary vascularity are normal. No adenopathy. No pneumothorax or pneumomediastinum. No bone lesions. IMPRESSION: No abnormality noted by radiography. Electronically Signed   By: Bretta Bang III M.D.   On: 09/29/2019 12:57    Procedures Procedures (including critical care time) CRITICAL CARE Performed by: Gailen Shelter   Total critical care time: 35 minutes  Critical care time was exclusive of separately billable procedures and treating other patients.  Critical care was necessary to treat or prevent imminent or life-threatening deterioration.  Critical care was time spent personally by me on the following activities: development of treatment plan with patient and/or surrogate as well as nursing, discussions with consultants, evaluation of patient's response to treatment, examination of patient, obtaining history from patient or surrogate, ordering and performing treatments and interventions, ordering and review of laboratory studies, ordering and review of radiographic studies, pulse oximetry and re-evaluation of patient's condition.   Medications Ordered in ED Medications  LORazepam (ATIVAN) tablet 0.5 mg ( Oral MAR Hold 09/29/19 2014)  acetaminophen (TYLENOL) tablet 1,000 mg (1,000 mg Oral Given 09/29/19 1152)  gadobutrol  (GADAVIST) 1 MMOL/ML injection 5 mL (5 mLs Intravenous Contrast Given 09/29/19 1651)  sodium chloride 0.9 % bolus 1,000 mL (0 mLs Intravenous Stopped 09/29/19 2002)  morphine 4 MG/ML injection 4 mg (4 mg Intravenous Given 09/29/19 1754)    ED Course  I have reviewed the triage vital signs and the nursing notes.  Pertinent labs & imaging results that were available during my care of the patient were reviewed by me and considered in my medical decision making (see chart for details).  Clinical Course as of Sep 29 2015  Tue Sep 29, 2019  1254 Mild leukocytosis consistent with CBC results of prior hospitalization--however on discharge 1 month ago WBC was within normal months.  WBC(!): 13.8 [WF]  1337 CXR without abnormality. Personally reviewed. No fractures, infiltrates or other abn.   [WF]  1338 Non-specific TWI. No ST abnormalities. Some borderline LVH. No acute abnormalities.    [WF]  1339 BMP unremarkable  Basic metabolic panel(!) [WF]  1806 Discussed w/ neurosurgery. They will review imaging and make recommendations.    [WF]  1824 Dr. Maurice Small of neurosurgery call to make aware that patient will require neurosurgery for phlegmon.  Recommends no antibiotics at this time.  Will discuss with charge nurse and transfer patient to [facility].   [WF]  1842 Discussed with Dr. Su Ley who accepts transfer of patient. Will discuss with charge at [facility]. Also discussed with Kennyth Arnold charge nurse at [facility] Patient ready for transport.   [WF]  1845 Discussed with bedside nurse: Okay well she's NPO now. Charge is aware she needs to be transported to the ED. Dr. Su Ley will accept. Dr. Maurice Small will see/admitConfirmed NPO status   [WF]    Clinical Course User Index [WF] Gailen Shelter, PA   MDM Rules/Calculators/A&P                      Patient is a 39 year old female presented today with neck, back, shoulder and arm pain for the past 2 days since she started heroin.  Also endorses some  chest pain but denies nausea, diaphoresis, shortness of breath.  States that her pain is nonexertional.  Considered thoracic aortic dissection and spinal epidural abscess.  Patient denies any numbness,  weakness in arms or legs.  Denies any history of Marfan's or other connective tissue disease.  Her pulses are symmetric bilaterally and chest x-ray is without the sternal widening.Low suspicion for thoracic aortic dissection as patient is relatively young.  Will not further work this up at this time.  Because of patient's history of IV drug use did consider spinal epidural abscess.  However patient is afebrile with mild leukocytosis likely explained by her facial cellulitis she is not tachycardic on my exam and is well-appearing.  She also denies saddle anesthesia, bowel or bladder incontinence or blood thinner use.  She is neurologically intact doubt that patient has spinal abscess.  Doubt ACS as patient is young has very atypical sounding chest pain and has nonischemic EKG.  No indication to order troponins at this time.  Her pain seems very diffuse and more consistent with body aches likely due to her homelessness/polysubstance use/a musculoskeletal cause.  Doubt pulmonary embolism as patient is PERC negative excluding the triage tachycardia which is inconsistent with my exam.  Doubt pericarditis, doubt esophageal perforation.  Patient does have mild leukocytosis concern for spinal epidural abscess versus cellulitis of face--will obtain MRI to rule out SEA-- if negative will cover facial cellulitis with doxycycline regimen she was supposed to receive on discharge from hospital.  Along with topical clindamycin.  Consulted case management for medication assistance.    MRI with early spinal epidural abscess. Phlegmon present. Discussed with neurosurgery who recommends transfer to [place] for PACU admission.   Final Clinical Impression(s) / ED Diagnoses Final diagnoses:  Acute bilateral thoracic back  pain  Neck pain  Body aches  Methamphetamine abuse (HCC)  Spinal epidural abscess  IVDU (intravenous drug user)    I discussed this case with my attending physician who cosigned this note including patient's presenting symptoms, physical exam, and planned diagnostics and interventions. Attending physician stated agreement with plan or made changes. Dr. Stevie Kernykstra assessed pt at bedside.  Rx / DC Orders ED Discharge Orders    None       Gailen ShelterFondaw, Mikhia Dusek S, [place] 09/29/19 2017    Milagros Lollykstra, Richard S, MD 09/30/19 1012    Milagros Lollykstra, Richard S, MD 09/30/19 1018

## 2019-09-29 NOTE — Anesthesia Preprocedure Evaluation (Signed)
Anesthesia Evaluation  Patient identified by MRN, date of birth, ID band Patient awake    Reviewed: Allergy & Precautions, NPO status , Patient's Chart, lab work & pertinent test results  Airway Mallampati: II  TM Distance: >3 FB Neck ROM: Limited    Dental  (+) Edentulous Upper, Edentulous Lower   Pulmonary Current Smoker,    breath sounds clear to auscultation       Cardiovascular  Rhythm:Regular Rate:Normal     Neuro/Psych    GI/Hepatic   Endo/Other    Renal/GU      Musculoskeletal   Abdominal   Peds  Hematology   Anesthesia Other Findings   Reproductive/Obstetrics                             Anesthesia Physical Anesthesia Plan  ASA: III and emergent  Anesthesia Plan: General   Post-op Pain Management:    Induction: Intravenous, Cricoid pressure planned and Rapid sequence  PONV Risk Score and Plan: Ondansetron and Dexamethasone  Airway Management Planned: Oral ETT and Video Laryngoscope Planned  Additional Equipment:   Intra-op Plan:   Post-operative Plan: Extubation in OR  Informed Consent: I have reviewed the patients History and Physical, chart, labs and discussed the procedure including the risks, benefits and alternatives for the proposed anesthesia with the patient or authorized representative who has indicated his/her understanding and acceptance.       Plan Discussed with: CRNA and Anesthesiologist  Anesthesia Plan Comments:         Anesthesia Quick Evaluation

## 2019-09-29 NOTE — Progress Notes (Signed)
Unable to obtain urine from patient before came up from ED. Dr Noreene Larsson made aware.

## 2019-09-29 NOTE — H&P (Signed)
Neurosurgery H&P  CC: Neck pain  HPI: This is a 39 y.o. woman that presents with severe neck pain in the setting of IVDU w/ methamphetamine. She also has a h/o anemia, anxiety, and depression, recent admission for facial cellulitis. She has had 2 days of severe bilateral neck / interscapular and bilateral arm pain. She is currently in severe pain and is not a detailed historian. She does endorse that the pain is sharp in quality, severe in intensity, worse with movement but present at rest. She denies any new weakness, but has pain limited strength in the BUE. She does endorse some bilateral hand numbness but cannot tell which fingers. No recent change in bowel or bladder function. No recent use of anti-platelet or anti-coagulant medications.  ROS: A 14 point ROS was performed and is negative except as noted in the HPI.   PMHx:  Past Medical History:  Diagnosis Date  . Anemia   . Depression   . Drug abuse (HCC)    FamHx: History reviewed. No pertinent family history. SocHx:  reports that she has been smoking cigarettes. She has a 17.00 pack-year smoking history. She has never used smokeless tobacco. She reports current alcohol use. She reports current drug use. Drugs: IV and Methamphetamines.  Exam: Vital signs in last 24 hours: Temp:  [98.2 F (36.8 C)] 98.2 F (36.8 C) (01/12 1047) Pulse Rate:  [95-114] 95 (01/12 1753) Resp:  [18-20] 18 (01/12 1753) BP: (105-117)/(78-80) 117/78 (01/12 1753) SpO2:  [94 %-100 %] 100 % (01/12 1753) Weight:  [49.9 kg] 49.9 kg (01/12 2032) General: Awake, alert, cooperative, lying in bed, appears uncomfortable Head: normocephalic and atruamatic HEENT: neck supple Pulmonary: breathing room air comfortably, no evidence of increased work of breathing Cardiac: RRR Abdomen: S NT ND Extremities: warm and well perfused x4, decreased bulk, normal tone Neuro: AOx3, PERRL, EOMI, FS Strength 5/5 x4, SILTx4 except stocking and glove numbness, no dermatomal  numbness, no Hoffman's, 2 beats of ankle clonus bilaterally  Assessment and Plan: 39 y.o. woman w/ neck pain in the setting of IVDU. MRI C-spine personally reviewed, which shows discitis/osteomyelitis and C5-6 and C6-7 with epidural abscess versus phlegmon from C4-C7 producing stenosis.  -OR now for ACDF to decompress the cord from ventral abscess material, debride any accessible phlegmon, and obtain cultures. Discussed risks, benefits, and alternatives and pt would like to proceed -hold ABx preop, will give intra-op vanc/zosyn p Cx are obtained -c/s medicine in AM for further management  Jadene Pierini, MD 09/29/19 8:34 PM [facility]

## 2019-09-30 ENCOUNTER — Other Ambulatory Visit: Payer: Self-pay

## 2019-09-30 ENCOUNTER — Inpatient Hospital Stay (HOSPITAL_COMMUNITY): Payer: Self-pay

## 2019-09-30 ENCOUNTER — Encounter: Payer: Self-pay | Admitting: *Deleted

## 2019-09-30 DIAGNOSIS — B9689 Other specified bacterial agents as the cause of diseases classified elsewhere: Secondary | ICD-10-CM

## 2019-09-30 DIAGNOSIS — Z981 Arthrodesis status: Secondary | ICD-10-CM

## 2019-09-30 DIAGNOSIS — F329 Major depressive disorder, single episode, unspecified: Secondary | ICD-10-CM

## 2019-09-30 DIAGNOSIS — F1990 Other psychoactive substance use, unspecified, uncomplicated: Secondary | ICD-10-CM | POA: Diagnosis present

## 2019-09-30 DIAGNOSIS — Z9889 Other specified postprocedural states: Secondary | ICD-10-CM

## 2019-09-30 DIAGNOSIS — G062 Extradural and subdural abscess, unspecified: Secondary | ICD-10-CM

## 2019-09-30 DIAGNOSIS — F17210 Nicotine dependence, cigarettes, uncomplicated: Secondary | ICD-10-CM

## 2019-09-30 DIAGNOSIS — Z872 Personal history of diseases of the skin and subcutaneous tissue: Secondary | ICD-10-CM

## 2019-09-30 DIAGNOSIS — A419 Sepsis, unspecified organism: Secondary | ICD-10-CM

## 2019-09-30 DIAGNOSIS — Z5900 Homelessness unspecified: Secondary | ICD-10-CM

## 2019-09-30 DIAGNOSIS — M4642 Discitis, unspecified, cervical region: Secondary | ICD-10-CM

## 2019-09-30 DIAGNOSIS — M4622 Osteomyelitis of vertebra, cervical region: Secondary | ICD-10-CM

## 2019-09-30 DIAGNOSIS — F1910 Other psychoactive substance abuse, uncomplicated: Secondary | ICD-10-CM

## 2019-09-30 DIAGNOSIS — G061 Intraspinal abscess and granuloma: Secondary | ICD-10-CM

## 2019-09-30 DIAGNOSIS — F419 Anxiety disorder, unspecified: Secondary | ICD-10-CM | POA: Diagnosis present

## 2019-09-30 DIAGNOSIS — Z9884 Bariatric surgery status: Secondary | ICD-10-CM

## 2019-09-30 LAB — ECHOCARDIOGRAM COMPLETE
Height: 63 in
Weight: 1760 oz

## 2019-09-30 MED ORDER — VANCOMYCIN HCL 1.25 G IV SOLR
1250.0000 mg | INTRAVENOUS | Status: DC
  Filled 2019-09-30: qty 1250

## 2019-09-30 MED ORDER — PIPERACILLIN-TAZOBACTAM 3.375 G IVPB
3.3750 g | Freq: Three times a day (TID) | INTRAVENOUS | Status: DC
  Administered 2019-09-30 (×2): 3.375 g via INTRAVENOUS
  Filled 2019-09-30 (×2): qty 50

## 2019-09-30 MED ORDER — ADULT MULTIVITAMIN W/MINERALS CH
1.0000 | ORAL_TABLET | Freq: Every day | ORAL | Status: DC
  Administered 2019-09-30 – 2019-12-11 (×73): 1 via ORAL
  Filled 2019-09-30 (×73): qty 1

## 2019-09-30 MED ORDER — NICOTINE 21 MG/24HR TD PT24
21.0000 mg | MEDICATED_PATCH | Freq: Every day | TRANSDERMAL | Status: DC
  Administered 2019-09-30 – 2019-10-19 (×19): 21 mg via TRANSDERMAL
  Filled 2019-09-30 (×20): qty 1

## 2019-09-30 MED ORDER — ENSURE ENLIVE PO LIQD
237.0000 mL | Freq: Two times a day (BID) | ORAL | Status: DC
  Administered 2019-09-30 – 2019-12-02 (×67): 237 mL via ORAL

## 2019-09-30 MED ORDER — VANCOMYCIN HCL 1250 MG/250ML IV SOLN
1250.0000 mg | INTRAVENOUS | Status: DC
  Administered 2019-09-30 – 2019-10-03 (×4): 1250 mg via INTRAVENOUS
  Filled 2019-09-30 (×5): qty 250

## 2019-09-30 NOTE — Progress Notes (Signed)
Initial Nutrition Assessment  RD working remotely.  DOCUMENTATION CODES:   Not applicable  INTERVENTION:   -Ensure Enlive po BID, each supplement provides 350 kcal and 20 grams of protein -MVI with minerals daily  NUTRITION DIAGNOSIS:   Increased nutrient needs related to post-op healing as evidenced by estimated needs.  GOAL:   Patient will meet greater than or equal to 90% of their needs  MONITOR:   PO intake, Supplement acceptance, Labs, Weight trends, Skin, I & O's  REASON FOR ASSESSMENT:   Malnutrition Screening Tool    ASSESSMENT:   This is a 39 y.o. woman that presents with severe neck pain in the setting of IVDU w/ methamphetamine. She also has a h/o anemia, anxiety, and depression, recent admission for facial cellulitis. She has had 2 days of severe bilateral neck / interscapular and bilateral arm pain. She is currently in severe pain and is not a detailed historian. She does endorse that the pain is sharp in quality, severe in intensity, worse with movement but present at rest. She denies any new weakness, but has pain limited strength in the BUE. She does endorse some bilateral hand numbness but cannot tell which fingers. No recent change in bowel or bladder function. No recent use of anti-platelet or anti-coagulant medications.  Pt admitted with cervical epidural abscess.   1/12- s/p PROCEDURE:C5-C6 Anterior Cervical Discectomy and Instrumented Fusion  Reviewed I/O's: +2.4 L x 24 hours  Attempted to speak with pt x 3, however, phone message reports user is busy. RD unable to obtain further nutrition-related history at this time.   Per RN notes, pt has voiced.   Meal completion records indicate pt has consumed 75% of breakfast meal today.   Reviewed wt hx; pt has experienced a 11.7% wt loss over the past month, which is significant for time frame. However, per Care Everywhere [facility]) recorded wt of 105# documented on 09/08/19, so unsure of accuracy  of wts.   Pt with increased nutrient needs for post-operative healing and would benefit from addition of oral nutrition supplements.   Labs reviewed: Na: 134.   Diet Order:   Diet Order            Diet regular Room service appropriate? Yes; Fluid consistency: Thin  Diet effective now              EDUCATION NEEDS:   No education needs have been identified at this time  Skin:  Skin Assessment: Skin Integrity Issues: Skin Integrity Issues:: Incisions Incisions: closed neck  Last BM:  09/29/19  Height:   Ht Readings from Last 1 Encounters:  09/29/19 5\' 3"  (1.6 m)    Weight:   Wt Readings from Last 1 Encounters:  09/29/19 49.9 kg    Ideal Body Weight:  52.2 kg  BMI:  Body mass index is 19.49 kg/m.  Estimated Nutritional Needs:   Kcal:  1750-1950  Protein:  85-100 grams  Fluid:  > 1.7 L    Ricki Clack A. 11/27/19, RD, LDN, CDCES Registered Dietitian II Certified Diabetes Care and Education Specialist Pager: [phone] After hours Pager: [phone]

## 2019-09-30 NOTE — Evaluation (Signed)
Physical Therapy Evaluation Patient Details Name: Colleen Ramirez MRN: [id number] DOB: 10/30/1980 Today's Date: 09/30/2019   History of Present Illness  Colleen Ramirez is a 39 y.o. F admitted to the ED with c/o severe neck pain secondary to a cervical epidural abscess. She is s/p C5-6 anterior cervical discectomy and instrumented fusion. PMH: IVDU, anemia, anxiety, depression, recent admission for facial cellulitis.   Clinical Impression  Pt admitted for above. She presents with deficits in strength, activity tolerance, pain, gait, balance, and overall functional mobility. She completed bed mobility with min assist, and transfers and gait min guard for safety and lines. She used good log roll technique to get in and out of bed and was able to ambulate to and from the bathroom today with cueing for safety. She was limited by severe pain and fatigue, and refused any further mobility at this time. Unable to fully assess balance or hallway ambulation. She did not require an AD today, but will continue to assess need as mobility progresses. Pt states that she currently lives in a hotel and has no support at all as she lives alone. Based on her current mobility status and lack of support, recommend SNF to help maximize function and independence following acute discharge. Hopeful that pt will progress as pain is better managed and may potentially be able to discharge home v. SNF. Pt would benefit from continued skilled acute PT intervention in order to address deficits.     Follow Up Recommendations SNF    Equipment Recommendations  Other (comment)(TBD next venue)    Recommendations for Other Services       Precautions / Restrictions Precautions Precautions: Fall;Cervical Precaution Booklet Issued: No Restrictions Weight Bearing Restrictions: No      Mobility  Bed Mobility Overal bed mobility: Needs Assistance Bed Mobility: Rolling;Sidelying to Sit;Sit to Sidelying Rolling: Min  guard Sidelying to sit: Min assist       General bed mobility comments: min guard for rolling; pt initiated log roll task but required min assist for trunk righting as she had difficulty pushing through rail; pt returned to bed with min guard for safety and wished to remain in sidelying position  Transfers Overall transfer level: Needs assistance Equipment used: None Transfers: Sit to/from Stand Sit to Stand: Min guard         General transfer comment: min guard for safety and for lines; pt able to power up on her own and completed sit to stand x2 from bed and toilet; good eccentric lower onto toilet  Ambulation/Gait Ambulation/Gait assistance: Min guard Gait Distance (Feet): 20 Feet Assistive device: None Gait Pattern/deviations: Step-through pattern;Decreased stride length Gait velocity: decreased   General Gait Details: pt with decreased stride and speed; min guard for safety and lines; no LOB or unsteadiness but pt did not wish to walk any further than the bathroom and back  Stairs            Wheelchair Mobility    Modified Rankin (Stroke Patients Only)       Balance Overall balance assessment: Mild deficits observed, not formally tested                                           Pertinent Vitals/Pain Pain Assessment: 0-10 Pain Score: 10-Worst pain ever("10,000/10) Pain Location: neck Pain Descriptors / Indicators: Aching;Constant;Discomfort;Grimacing;Guarding;Moaning;Shooting;Sharp Pain Intervention(s): Limited activity within patient's tolerance;Monitored during session  Home Living Family/patient expects to be discharged to:: Shelter/Homeless                 Additional Comments: pt states she lives in a hotel with no stairs to enter and no equipment; she states she lives alone    Prior Function Level of Independence: Independent               Hand Dominance   Dominant Hand: Right    Extremity/Trunk Assessment    Upper Extremity Assessment Upper Extremity Assessment: Defer to OT evaluation    Lower Extremity Assessment Lower Extremity Assessment: Generalized weakness       Communication   Communication: No difficulties  Cognition Arousal/Alertness: Lethargic Behavior During Therapy: Flat affect Overall Cognitive Status: Within Functional Limits for tasks assessed                                        General Comments      Exercises     Assessment/Plan    PT Assessment Patient needs continued PT services  PT Problem List Decreased strength;Decreased activity tolerance;Decreased balance;Decreased mobility;Decreased coordination;Decreased knowledge of use of DME;Decreased safety awareness;Pain       PT Treatment Interventions DME instruction;Stair training;Gait training;Functional mobility training;Therapeutic activities;Therapeutic exercise;Balance training;Neuromuscular re-education;Patient/family education;Modalities    PT Goals (Current goals can be found in the Care Plan section)  Acute Rehab PT Goals Patient Stated Goal: decrease pain PT Goal Formulation: With patient Time For Goal Achievement: 10/14/19 Potential to Achieve Goals: Good    Frequency Min 5X/week   Barriers to discharge Decreased caregiver support pt does not have assistance upon discharge    Co-evaluation               AM-PAC PT "6 Clicks" Mobility  Outcome Measure Help needed turning from your back to your side while in a flat bed without using bedrails?: A Little Help needed moving from lying on your back to sitting on the side of a flat bed without using bedrails?: A Little Help needed moving to and from a bed to a chair (including a wheelchair)?: A Little Help needed standing up from a chair using your arms (e.g., wheelchair or bedside chair)?: A Little Help needed to walk in hospital room?: A Little Help needed climbing 3-5 steps with a railing? : A Lot 6 Click Score: 17     End of Session Equipment Utilized During Treatment: Gait belt Activity Tolerance: Patient limited by pain;Patient limited by fatigue;Patient limited by lethargy Patient left: in bed;with call bell/phone within reach Nurse Communication: Mobility status PT Visit Diagnosis: Unsteadiness on feet (R26.81);Other abnormalities of gait and mobility (R26.89);Muscle weakness (generalized) (M62.81);Pain Pain - part of body: (neck)    Time: 0272-5366 PT Time Calculation (min) (ACUTE ONLY): 11 min   Charges:   PT Evaluation $PT Eval Low Complexity: 1 Low          Christel Mormon, SPT  Jerusha Reising 09/30/2019, 1:01 PM

## 2019-09-30 NOTE — Plan of Care (Signed)

## 2019-09-30 NOTE — Progress Notes (Signed)
Physical Therapy Progress Note for Charges    09/30/19 1200  PT Visit Information  Last PT Received On 09/30/19  PT General Charges  $$ ACUTE PT VISIT 1 Visit  PT Evaluation  $PT Eval Low Complexity 1 Low  Ginette Pitman, PT, DPT  Acute Rehabilitation Services Pager [phone] Office [phone]

## 2019-09-30 NOTE — Progress Notes (Signed)
Pharmacy Antibiotic Note  Colleen Ramirez is a 39 y.o. female admitted on 09/29/2019 with cervical epidural abscess.  Pharmacy has been consulted for vancomycin and Zosyn dosing.  Plan: Vancomycin 1000mg  given perioperatively; continue with 1250mg  IV Q 24 hrs. Goal AUC 400-550.  Expected AUC 500.  SCr used 0.8.  Zosyn 3.375g IV Q8H (4-hour infusion).  Height: 5\' 3"  (160 cm) Weight: 110 lb (49.9 kg) IBW/kg (Calculated) : 52.4  Temp (24hrs), Avg:98.9 F (37.2 C), Min:98.2 F (36.8 C), Max:99.8 F (37.7 C)  Recent Labs  Lab 09/29/19 1214  WBC 13.8*  CREATININE 0.49    Estimated Creatinine Clearance: 74.4 mL/min (by C-G formula based on SCr of 0.49 mg/dL).    No Known Allergies   Thank you for allowing pharmacy to be a part of this patient's care.  , PharmD, BCPS  09/30/2019 12:00 AM

## 2019-09-30 NOTE — Progress Notes (Signed)
Pt voided

## 2019-09-30 NOTE — Progress Notes (Signed)
  Echocardiogram 2D Echocardiogram has been performed.  Colleen Ramirez 09/30/2019, 3:39 PM

## 2019-09-30 NOTE — Progress Notes (Signed)
   Vital Signs MEWS/VS Documentation      09/30/2019 0857 09/30/2019 1352 09/30/2019 1440 09/30/2019 2041   MEWS Score:  1  1  0  2   MEWS Score Color:  Green  Green  Green  Yellow   Resp:  --  --  16  18   Pulse:  (!) 101  --  98  (!) 120   BP:  115/87  --  118/85  111/77   Temp:  97.9 F (36.6 C)  --  98.2 F (36.8 C)  97.9 F (36.6 C)   O2 Device:  --  --  Room Air  --   Level of Consciousness:  --  Alert  --  --     MD notified of patients HR at 120. Patient is experiencing pai 10/10 and just received PRN pain medication. MD advised to continue with PRN pain medications and monitor.

## 2019-09-30 NOTE — Evaluation (Addendum)
Occupational Therapy Evaluation Patient Details Name: Colleen Ramirez MRN: [id number] DOB: 30-Mar-1981 Today's Date: 09/30/2019    History of Present Illness Colleen Ramirez is a 39 y.o. F admitted to the ED with c/o severe neck pain secondary to a cervical epidural abscess. She is s/p C5-6 anterior cervical discectomy and instrumented fusion. PMH: IVDU, anemia, anxiety, depression, recent admission for facial cellulitis.    Clinical Impression   Pt admitted with the above diagnoses and presents with below problem list. Pt will benefit from continued acute OT to address the below listed deficits and maximize independence with basic ADLs prior to d/c. PTA pt was independent with basic ADLs. She is currently homeless/housing insecure. Pt lethargic and reporting 10/10 neck pain, premedicated. Completed bed mobility and toileting tasks in bathroom this session. Pt is currently min guard with LB ADLs and functional transfers/mobility. Hopefully she will progress to be able to safely d/c directly from the hospital to her living arrangement. But due to her needing min guard assist currently and no assist available at d/c am recommending consideration of ST SNF though this will likely be challenging given her h/o substance abuse. Will continue to follow and update d/c recommendations as indicated.      Follow Up Recommendations  SNF;Supervision/Assistance - 24 hour    Equipment Recommendations  None recommended by OT ; cervical collar for comfort?   Recommendations for Other Services       Precautions / Restrictions Precautions Precautions: Fall;Cervical Precaution Booklet Issued: Yes (comment) Restrictions Weight Bearing Restrictions: No      Mobility Bed Mobility Overal bed mobility: Needs Assistance Bed Mobility: Rolling;Sidelying to Sit Rolling: Min guard Sidelying to sit: Min assist       General bed mobility comments: extra time and effort. logrolled to left side at min guard level  for safety.[name] Kitchen HOB elevated  Transfers Overall transfer level: Needs assistance Equipment used: None Transfers: Sit to/from Stand Sit to Stand: Min guard         General transfer comment: to/from EOB, comfort height toilet, and recliner. min guard for safety. pt with guarded, cautious movements    Balance Overall balance assessment: Mild deficits observed, not formally tested                                         ADL either performed or assessed with clinical judgement   ADL Overall ADL's : Needs assistance/impaired Eating/Feeding: Set up;Sitting   Grooming: Min guard;Standing   Upper Body Bathing: Set up;Sitting   Lower Body Bathing: Min guard;Sit to/from stand   Upper Body Dressing : Set up;Sitting   Lower Body Dressing: Min guard;Sit to/from stand   Toilet Transfer: Min guard;Ambulation;Comfort height toilet;Grab bars   Toileting- Clothing Manipulation and Hygiene: Min guard;Sit to/from stand   Tub/ Shower Transfer: Min guard;Ambulation;3 in 1   Functional mobility during ADLs: Min guard General ADL Comments: Pt completed bed mobility, toilet transfer and pericare. Began education on cervical precautions and issued LB AE kit. Pt lethargic, more alert once OOB.     Vision         Perception     Praxis      Pertinent Vitals/Pain Pain Assessment: 0-10 Pain Score: 10-Worst pain ever Pain Location: neck Pain Descriptors / Indicators: Aching;Constant;Discomfort;Grimacing;Guarding;Moaning;Shooting;Sharp Pain Intervention(s): Limited activity within patient's tolerance;Monitored during session;Premedicated before session;Repositioned     Hand Dominance Right   Extremity/Trunk Assessment Upper  Extremity Assessment Upper Extremity Assessment: RUE deficits/detail;LUE deficits/detail RUE Deficits / Details: B shoulder pain and numbness in B digits. LUE Deficits / Details: B shoulder pain and numbness in B digits.   Lower Extremity  Assessment Lower Extremity Assessment: Generalized weakness;Defer to PT evaluation       Communication Communication Communication: Other (comment)(low volume, soft voice)   Cognition Arousal/Alertness: Lethargic Behavior During Therapy: Flat affect Overall Cognitive Status: Within Functional Limits for tasks assessed                                     General Comments       Exercises     Shoulder Instructions      Home Living Family/patient expects to be discharged to:: Shelter/Homeless                                 Additional Comments: pt states she lives in a hotel with no stairs to enter and no equipment; she states she lives alone      Prior Functioning/Environment Level of Independence: Independent                 OT Problem List: Decreased strength;Decreased activity tolerance;Impaired balance (sitting and/or standing);Decreased knowledge of use of DME or AE;Decreased knowledge of precautions;Pain      OT Treatment/Interventions: Self-care/ADL training;Therapeutic exercise;DME and/or AE instruction;Therapeutic activities;Patient/family education;Balance training    OT Goals(Current goals can be found in the care plan section) Acute Rehab OT Goals Patient Stated Goal: decrease pain OT Goal Formulation: With patient Time For Goal Achievement: 10/14/19 Potential to Achieve Goals: Good ADL Goals Pt Will Perform Grooming: with modified independence;standing Pt Will Perform Upper Body Dressing: with modified independence;sitting Pt Will Perform Lower Body Dressing: with modified independence;sit to/from stand Pt Will Transfer to Toilet: with modified independence;ambulating Pt Will Perform Toileting - Clothing Manipulation and hygiene: with modified independence;sit to/from stand  OT Frequency: Min 2X/week   Barriers to D/C: Decreased caregiver support;Other (comment)(homeless/housing insecure)  also with h/o polysubstance  abuse       Co-evaluation              AM-PAC OT "6 Clicks" Daily Activity     Outcome Measure Help from another person eating meals?: None Help from another person taking care of personal grooming?: None Help from another person toileting, which includes using toliet, bedpan, or urinal?: A Little Help from another person bathing (including washing, rinsing, drying)?: A Little Help from another person to put on and taking off regular upper body clothing?: None Help from another person to put on and taking off regular lower body clothing?: A Little 6 Click Score: 21   End of Session    Activity Tolerance: Patient limited by pain;Patient limited by lethargy Patient left: in chair;with call bell/phone within reach  OT Visit Diagnosis: Unsteadiness on feet (R26.81);Muscle weakness (generalized) (M62.81);Pain                Time: 4818-5631 OT Time Calculation (min): 22 min Charges:  OT General Charges $OT Visit: 1 Visit OT Evaluation $OT Eval Low Complexity: [place], OT Acute Rehabilitation Services Pager: [phone] Office: [phone]   Hortencia Pilar 09/30/2019, 2:26 PM

## 2019-09-30 NOTE — Consult Note (Addendum)
Medical Consultation   Colleen Ramirez  VQM:[id number]  DOB: 1981-02-26  DOA: 09/29/2019  PCP: Patient, No Pcp Per    Requesting physician: Emelda Brothers, MD   Reason for consultation: Medical management  History of Present Illness: Colleen Ramirez is an 39 y.o. female IV drug use (methamphetamine), anemia, anxiety, depression,  s/p gastric bypass, and homelessness, who initially presented with a 2-day history of severe sharp neck pain yesterday. Patient gives a limited history.  Pain is constant, but worse with any kind of movement.   Admits to last using "Ice" for crystal meth approximately 2 days ago.  Patient on injects into her arms.  Just recently hospitalized from12/1/20-12/8/20 with facial cellulitis.  She was doxycycline 100 mg twice daily  to complete a 10-day course and clindamycin external solution, but it appears not to have completed this..     Review of Systems  Musculoskeletal: Positive for neck pain.  Psychiatric/Behavioral: Positive for substance abuse.   As per HPI otherwise 10 point review of systems negative.    Past Medical History: Past Medical History:  Diagnosis Date  . Anemia   . Depression   . Drug abuse [facility])     Past Surgical History: Past Surgical History:  Procedure Laterality Date  . CESAREAN SECTION    . GASTRIC BYPASS       Allergies:  No Known Allergies   Social History:  reports that she has been smoking cigarettes. She has a 17.00 pack-year smoking history. She has never used smokeless tobacco. She reports current alcohol use. She reports current drug use. Drugs: IV and Methamphetamines.   Family History: History reviewed. No pertinent family history.    Physical Exam: Vitals:   09/29/19 2333 09/30/19 0315 09/30/19 0836 09/30/19 0857  BP: 114/85 117/87 (!) 123/94 115/87  Pulse: (!) 105 (!) 103 (!) 105 (!) 101  Resp: 16  17   Temp: 99.8 F (37.7 C) 98.7 F (37.1 C) 98.8 F (37.1 C) 97.9 F (36.6 C)    TempSrc: Oral Oral Oral Oral  SpO2: 97% 99% 100% 100%  Weight:      Height:        Constitutional: Middle-aged female who appears to be older than stated age sitting sitting up in a chair Eyes: PERLA, EOMI, irises appear normal, anicteric sclera,  ENMT: external ears and nose appear normal, Lips appears normal, oropharynx mucosa, tongue, posterior pharynx appear normal  Neck: Healing postsurgical scar present. CVS: S1-S2 clear, no murmur rubs or gallops, no LE edema, normal pedal pulses  Respiratory:  clear to auscultation bilaterally, no wheezing, rales or rhonchi. Respiratory effort normal. No accessory muscle use.  Abdomen: soft nontender, nondistended, normal bowel sounds, no hepatosplenomegaly, no hernias  Musculoskeletal: Patient able to move all extremities Neuro: Cranial nerves II-XII intact, strength, sensation, reflexes Psych: judgement and insight appear normal, stable mood and affect, mental status Skin: no rashes or lesions or ulcers, no induration or nodules   Data reviewed:  I have personally reviewed following labs and imaging studies Labs:  CBC: Recent Labs  Lab 09/29/19 1214  WBC 13.8*  NEUTROABS 11.7*  HGB 12.5  HCT 38.8  MCV 86.6  PLT 932    Basic Metabolic Panel: Recent Labs  Lab 09/29/19 1214  NA 134*  K 3.8  CL 99  CO2 25  GLUCOSE 111*  BUN 10  CREATININE 0.49  CALCIUM 8.8*   GFR Estimated Creatinine Clearance:  74.4 mL/min (by C-G formula based on SCr of 0.49 mg/dL). Liver Function Tests: No results for input(s): AST, ALT, ALKPHOS, BILITOT, PROT, ALBUMIN in the last 168 hours. No results for input(s): LIPASE, AMYLASE in the last 168 hours. No results for input(s): AMMONIA in the last 168 hours. Coagulation profile No results for input(s): INR, PROTIME in the last 168 hours.  Cardiac Enzymes: No results for input(s): CKTOTAL, CKMB, CKMBINDEX, TROPONINI in the last 168 hours. BNP: Invalid input(s): POCBNP CBG: No results for input(s):  GLUCAP in the last 168 hours. D-Dimer No results for input(s): DDIMER in the last 72 hours. Hgb A1c No results for input(s): HGBA1C in the last 72 hours. Lipid Profile No results for input(s): CHOL, HDL, LDLCALC, TRIG, CHOLHDL, LDLDIRECT in the last 72 hours. Thyroid function studies No results for input(s): TSH, T4TOTAL, T3FREE, THYROIDAB in the last 72 hours.  Invalid input(s): FREET3 Anemia work up No results for input(s): VITAMINB12, FOLATE, FERRITIN, TIBC, IRON, RETICCTPCT in the last 72 hours. Urinalysis    Component Value Date/Time   COLORURINE YELLOW 07/24/2019 0637   APPEARANCEUR HAZY (A) 07/24/2019 0637   LABSPEC >1.030 (H) 07/24/2019 0637   PHURINE 6.0 07/24/2019 0637   GLUCOSEU NEGATIVE 07/24/2019 0637   HGBUR NEGATIVE 07/24/2019 0637   BILIRUBINUR NEGATIVE 07/24/2019 0637   KETONESUR NEGATIVE 07/24/2019 0637   PROTEINUR 30 (A) 07/24/2019 0637   NITRITE NEGATIVE 07/24/2019 0637   LEUKOCYTESUR NEGATIVE 07/24/2019 1610     Microbiology Recent Results (from the past 240 hour(s))  SARS CORONAVIRUS 2 (TAT 6-24 HRS) Nasopharyngeal Nasopharyngeal Swab     Status: None   Collection Time: 09/29/19 11:38 AM   Specimen: Nasopharyngeal Swab  Result Value Ref Range Status   SARS Coronavirus 2 NEGATIVE NEGATIVE Final    Comment: (NOTE) SARS-CoV-2 target nucleic acids are NOT DETECTED. The SARS-CoV-2 RNA is generally detectable in upper and lower respiratory specimens during the acute phase of infection. Negative results do not preclude SARS-CoV-2 infection, do not rule out co-infections with other pathogens, and should not be used as the sole basis for treatment or other patient management decisions. Negative results must be combined with clinical observations, patient history, and epidemiological information. The expected result is Negative. Fact Sheet for Patients: HairSlick.no Fact Sheet for Healthcare  Providers: quierodirigir.com This test is not yet approved or cleared by the [place] FDA and  has been authorized for detection and/or diagnosis of SARS-CoV-2 by FDA under an Emergency Use Authorization (EUA). This EUA will remain  in effect (meaning this test can be used) for the duration of the COVID-19 declaration under Section 56 4(b)(1) of the Act, 21 U.S.C. section 360bbb-3(b)(1), unless the authorization is terminated or revoked sooner. Performed at [facility], [address] [place], [place] [address]   Aerobic/Anaerobic Culture (surgical/deep wound)     Status: None (Preliminary result)   Collection Time: 09/29/19  9:43 PM   Specimen: Abscess  Result Value Ref Range Status   Specimen Description ABSCESS  Final   Special Requests CERVICAL EPIDURAL  Final   Gram Stain   Final    RARE WBC PRESENT, PREDOMINANTLY PMN FEW GRAM POSITIVE COCCI IN CLUSTERS Performed at [facility], [address], [place], [place] [address]    Culture PENDING  Incomplete   Report Status PENDING  Incomplete       Inpatient Medications:   Scheduled Meds: . [START ON 07/30/2020] docusate sodium  100 mg Oral BID  . feeding supplement (  ENSURE ENLIVE)  237 mL Oral BID BM  . fentaNYL      . LORazepam  0.5 mg Oral Once  . multivitamin with minerals  1 tablet Oral Daily  . oxyCODONE      . sodium chloride flush  3 mL Intravenous Q12H   Continuous Infusions: . sodium chloride 250 mL (09/30/19 0126)  . piperacillin-tazobactam (ZOSYN)  IV 3.375 g (09/30/19 0321)  . vancomycin       Radiological Exams on Admission: DG Cervical Spine 2-3 Views  Result Date: 09/29/2019 CLINICAL DATA:  Cervical fixation EXAM: DG C-ARM 1-60 MIN; CERVICAL SPINE - 2-3 VIEW COMPARISON:  MR cervical spine same day FINDINGS: The patient is status post ACDF at C5-C6. Overlying lead wires are seen. Fluoro time 4 seconds IMPRESSION: Status post ACDF at C5-C6. Electronically  Signed   By: Jonna Clark M.D.   On: 09/29/2019 23:35   MR Cervical Spine W or Wo Contrast  Result Date: 09/29/2019 CLINICAL DATA:  Intravenous drug abuse.  Back pain. EXAM: MRI CERVICAL SPINE WITHOUT AND WITH CONTRAST TECHNIQUE: Multiplanar and multiecho pulse sequences of the cervical spine, to include the craniocervical junction and cervicothoracic junction, were obtained without and with intravenous contrast. CONTRAST:  59mL GADAVIST GADOBUTROL 1 MMOL/ML IV SOLN COMPARISON:  None. FINDINGS: Alignment: Straightening of the normal cervical lordosis. Vertebrae: Evidence of discitis osteomyelitis at C5-6 and C6-7. Abnormal low T1 signal of the marrow. Cannot rule out early involvement at C4-5. Surrounding phlegmonous soft tissue inflammatory changes including a prevertebral/retropharyngeal flag min and a ventral epidural fleck min. Small nonenhancing areas both in the anterior epidural region and in the prevertebral soft tissues represent early abscess. Cord: Spinal stenosis in the region from C4-C7 because of the epidural inflammation resulting in effacement of the subarachnoid space and slight deformity of the cord. Posterior Fossa, vertebral arteries, paraspinal tissues: No posterior fossa abnormality is seen. See above for extensive soft tissue inflammatory changes of the neck. Disc levels: As noted above, discitis osteomyelitis at C5-6 and C6-7 with possible or early involvement at C4-5. Pronounced epidural phlegmonous inflammation from C3-T1 with some early nonenhancing foci consistent with early epidural abscess at the C5-6 and C6-7 levels. 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 Pronounced prevertebral/retropharyngeal soft tissue phlegmonous inflammation with areas of nonenhancing material consistent with pus. Epidural phlegmonous inflammation from C3-T1, with a few small areas of nonenhancing material particularly at the C5-6 and C6-7 levels  consistent with early epidural abscess. Spinal stenosis in the region from C4-C7 because of this, with effacement of the subarachnoid space and some cord deformity. Electronically Signed   By: Paulina Fusi M.D.   On: 09/29/2019 16:58   MR THORACIC SPINE W WO CONTRAST  Result Date: 09/29/2019 CLINICAL DATA:  Intravenous drug abuser.  Severe back pain. EXAM: MRI THORACIC WITHOUT AND WITH CONTRAST TECHNIQUE: Multiplanar and multiecho pulse sequences of the thoracic spine were obtained without and with intravenous contrast. CONTRAST:  40mL GADAVIST GADOBUTROL 1 MMOL/ML IV SOLN COMPARISON:  Cervical study same day FINDINGS: MRI THORACIC SPINE FINDINGS Alignment:  Normal Vertebrae: No evidence spinal infection in the thoracic region. Probable incidental hemangioma within the right pedicle of T5. Cord:  Normal in the thoracic region. Paraspinal and other soft tissues: Normal in the thoracic region. Disc levels: Small chronic central disc herniation at T8-9. No neural compressive stenosis. Other levels negative. Patient does have some low level epidural inflammation extending all the way down to the midthoracic  region, particularly posteriorly, but without frank epidural abscess at this time. IMPRESSION: No evidence of primary spinal infection in the thoracic region. Epidural inflammation does extend into the thoracic region particularly posteriorly, as far as the midthoracic region. No evidence of a frank thoracic epidural abscess however. See cervical report for advanced spinal infection in that region. Electronically Signed   By: Paulina Fusi M.D.   On: 09/29/2019 17:03   DG Chest Port 1 View  Result Date: 09/29/2019 CLINICAL DATA:  Chest pain EXAM: PORTABLE CHEST 1 VIEW COMPARISON:  None. FINDINGS: Lungs are clear. Heart size and pulmonary vascularity are normal. No adenopathy. No pneumothorax or pneumomediastinum. No bone lesions. IMPRESSION: No abnormality noted by radiography. Electronically Signed   By:  Bretta Bang III M.D.   On: 09/29/2019 12:57   DG C-Arm 1-60 Min  Result Date: 09/29/2019 CLINICAL DATA:  Cervical fixation EXAM: DG C-ARM 1-60 MIN; CERVICAL SPINE - 2-3 VIEW COMPARISON:  MR cervical spine same day FINDINGS: The patient is status post ACDF at C5-C6. Overlying lead wires are seen. Fluoro time 4 seconds IMPRESSION: Status post ACDF at C5-C6. Electronically Signed   By: Jonna Clark M.D.   On: 09/29/2019 23:35    Impression/Recommendations Severe neck pain : Acute.  Patient presented with severe pain found to have a cervical epidural abscess.   Postop day 1 from cervical discectomy on 1/12 by Dr. Maurice Small. -Per neurosurgery  Sepsis secondary cervical epidural abscess: Patient was found to be tachycardic with WBC elevated at 13.8.  No initial lactic acid obtained.  Gram stain positive for gram-positive cocci in clusters.  Patient currently on broad-spectrum antibiotics of vancomycin and Zosyn day 2. -Follow-up epidural abscess cultures -Continue empiric antibiotics of vancomycin and Zosyn -Check transthoracic echocardiogram, but may require TEE -Recheck CBC in a.m. -Appreciate ID consultative services, will follow-up further recommendations  History of polysubstance abuse: Patient with previous history of IV drug use including methamphetamines and tobacco use.  Denies any use of any other illicit drugs.. -Transitions of care -Nicotine patch offered  Hyponatremia: Acute.  Patient with mild hyponatremia of 134 on admission. -Continue to monitor  Homelessness -Transitions of care  Thank you for this consultation.  Our [facility] hospitalist team will follow the patient with you.   Time Spent: 35 minutes  Clydie Braun M.D. [facility] 09/30/2019, 10:13 AM

## 2019-09-30 NOTE — Progress Notes (Signed)
Neurosurgery Service Progress Note  Subjective: No acute events overnight, stable neck pain, fingertip and toe numbness, strength subjectively stable   Objective: Vitals:   09/29/19 2333 09/30/19 0315 09/30/19 0836 09/30/19 0857  BP: 114/85 117/87 (!) 123/94 115/87  Pulse: (!) 105 (!) 103 (!) 105 (!) 101  Resp: 16  17   Temp: 99.8 F (37.7 C) 98.7 F (37.1 C) 98.8 F (37.1 C) 97.9 F (36.6 C)  TempSrc: Oral Oral Oral Oral  SpO2: 97% 99% 100% 100%  Weight:      Height:       Temp (24hrs), Avg:98.7 F (37.1 C), Min:97.9 F (36.6 C), Max:99.8 F (37.7 C)  CBC Latest Ref Rng & Units 09/29/2019 08/24/2019 08/22/2019  WBC 4.0 - 10.5 K/uL 13.8(H) 9.5 12.4(H)  Hemoglobin 12.0 - 15.0 g/dL 12.5 11.8(L) 12.2  Hematocrit 36.0 - 46.0 % 38.8 37.9 39.5  Platelets 150 - 400 K/uL 316 379 387   BMP Latest Ref Rng & Units 09/29/2019 08/24/2019 08/22/2019  Glucose 70 - 99 mg/dL 111(H) 72 107(H)  BUN 6 - 20 mg/dL 10 9 7   Creatinine 0.44 - 1.00 mg/dL 0.49 0.56 0.62  Sodium 135 - 145 mmol/L 134(L) 140 140  Potassium 3.5 - 5.1 mmol/L 3.8 4.0 4.1  Chloride 98 - 111 mmol/L 99 104 102  CO2 22 - 32 mmol/L 25 28 28   Calcium 8.9 - 10.3 mg/dL 8.8(L) 8.8(L) 9.4    Intake/Output Summary (Last 24 hours) at 09/30/2019 1001 Last data filed at 09/30/2019 0900 Gross per 24 hour  Intake 2641.68 ml  Output --  Net 2641.68 ml    Current Facility-Administered Medications:  .  0.9 %  sodium chloride infusion, 250 mL, Intravenous, Continuous, Ketsia Linebaugh A, MD, Last Rate: 1 mL/hr at 09/30/19 0126, 250 mL at 09/30/19 0126 .  acetaminophen (TYLENOL) tablet 650 mg, 650 mg, Oral, Q4H PRN, 650 mg at 09/30/19 0921 **OR** acetaminophen (TYLENOL) suppository 650 mg, 650 mg, Rectal, Q4H PRN, Judith Part, MD .  cyclobenzaprine (FLEXERIL) tablet 10 mg, 10 mg, Oral, TID PRN, Judith Part, MD, 10 mg at 09/30/19 5361 .  [START ON 07/30/2020] docusate sodium (COLACE) capsule 100 mg, 100 mg, Oral, BID,  Matthe Sloane A, MD .  feeding supplement (ENSURE ENLIVE) (ENSURE ENLIVE) liquid 237 mL, 237 mL, Oral, BID BM, Sharita Bienaime A, MD .  fentaNYL (SUBLIMAZE) 100 MCG/2ML injection, , , ,  .  HYDROmorphone (DILAUDID) injection 1 mg, 1 mg, Intravenous, Q3H PRN, Judith Part, MD, 1 mg at 09/30/19 0740 .  LORazepam (ATIVAN) tablet 0.5 mg, 0.5 mg, Oral, Once, [place], Wylder S, PA .  menthol-cetylpyridinium (CEPACOL) lozenge 3 mg, 1 lozenge, Oral, PRN **OR** phenol (CHLORASEPTIC) mouth spray 1 spray, 1 spray, Mouth/Throat, PRN, Charlyne Robertshaw, Joyice Faster, MD .  multivitamin with minerals tablet 1 tablet, 1 tablet, Oral, Daily, Dimitri Dsouza A, MD .  ondansetron (ZOFRAN) tablet 4 mg, 4 mg, Oral, Q6H PRN **OR** ondansetron (ZOFRAN) injection 4 mg, 4 mg, Intravenous, Q6H PRN, [place] Rosamond A, MD .  oxyCODONE (Oxy IR/ROXICODONE) 5 MG immediate release tablet, , , ,  .  oxyCODONE (Oxy IR/ROXICODONE) immediate release tablet 10 mg, 10 mg, Oral, Q4H PRN, Judith Part, MD, 10 mg at 09/30/19 4431 .  oxyCODONE (Oxy IR/ROXICODONE) immediate release tablet 5 mg, 5 mg, Oral, Q4H PRN, Iniko Robles A, MD .  piperacillin-tazobactam (ZOSYN) IVPB 3.375 g, 3.375 g, Intravenous, Q8H, Bryk, Veronda P, RPH, Last Rate: 12.5 mL/hr at 09/30/19 0321, 3.375  g at 09/30/19 0321 .  polyethylene glycol (MIRALAX / GLYCOLAX) packet 17 g, 17 g, Oral, Daily PRN, Ceejay Kegley A, MD .  sodium chloride flush (NS) 0.9 % injection 3 mL, 3 mL, Intravenous, Q12H, Shann Merrick, Clovis Pu, MD, 3 mL at 09/30/19 0921 .  sodium chloride flush (NS) 0.9 % injection 3 mL, 3 mL, Intravenous, PRN, Dhruv Christina, Clovis Pu, MD .  vancomycin (VANCOREADY) IVPB 1250 mg/250 mL, 1,250 mg, Intravenous, Q24H, Juliette Mangle, RPH   Physical Exam: AOx3, PERRL, EOMI, FS, Strength 5/5 x4 distally, pain limited in the bilateral shoulders in abduction / adduciton / extension / flexion, SILTx4 except for non-dermatomal fingertip and toe  numbness Incision c/d/i  Assessment & Plan: 39 y.o. woman w/ polysubstance abuse, severe neck pain, cervical epidural abscess, 1/12 s/p C5-6 ACDF for abscess evacuation. Gram stain w/ GPCs in clusters.  -on vanc/zosyn, pharmacy recs,  -medicine c/s -PT/OT, pt is homeless, SW consult -SCDs/TEDs, SQH POD2  Jadene Pierini  09/30/19 10:01 AM

## 2019-09-30 NOTE — Consult Note (Signed)
[facility] for Infectious Disease    Date of Admission:  09/29/2019           Day 2 vancomycin        Day 2 piperacillin tazobactam       Reason for Consult: Cervical spine infection    Referring Provider: Dr. Fuller Plan  Assessment: She has extensive cervical spine infection, most likely due to staph based on her operative Gram stain.  I will continue vancomycin alone for now.  If this turns out to be staph aureus I will add oral rifampin given the presence of new hardware.  Given her homelessness, polysubstance abuse and intravenous drug use she is not a candidate for outpatient IV antibiotic therapy with a PICC.  Plan: 1. Continue vancomycin pending final cultures 2. Discontinue piperacillin tazobactam 3. Check sed rate, C-reactive protein, HIV and hepatitis C antibodies 4. Hold off on PICC placement  Principal Problem:   Infection of cervical spine ([place]) Active Problems:   Acquired fusion of cervical spine   S/P cervical discectomy   Sepsis ([place])   Depression   IVDU (intravenous drug user)   Homeless   Anxiety   Status post gastric bypass for obesity   Scheduled Meds: . [START ON 07/30/2020] docusate sodium  100 mg Oral BID  . feeding supplement (ENSURE ENLIVE)  237 mL Oral BID BM  . LORazepam  0.5 mg Oral Once  . multivitamin with minerals  1 tablet Oral Daily  . nicotine  21 mg Transdermal Daily  . sodium chloride flush  3 mL Intravenous Q12H   Continuous Infusions: . sodium chloride 250 mL (09/30/19 0126)  . piperacillin-tazobactam (ZOSYN)  IV 3.375 g (09/30/19 1213)  . vancomycin     PRN Meds:.acetaminophen **OR** acetaminophen, cyclobenzaprine, HYDROmorphone (DILAUDID) injection, menthol-cetylpyridinium **OR** phenol, ondansetron **OR** ondansetron (ZOFRAN) IV, oxyCODONE, oxyCODONE, polyethylene glycol, sodium chloride flush  HPI: Colleen Ramirez is a 39 y.o. female who moved here from [place] several months ago.  She is homeless.  She has  a history of depression anxiety, polysubstance abuse and intravenous drug use.  She was hospitalized here in early November with abdominal pain and diarrhea.  She was hospitalized again last month with facial cellulitis.  She was treated with IV clindamycin and discharged on oral doxycycline and topical clindamycin.  She says that the lesion on her forehead has never healed completely.  Three days ago she had acute onset of severe neck pain causing her to come to the ED yesterday.  MRI revealed discitis and osteomyelitis at the C5-6 and C6-7 levels with associated epidural phlegmon from C3-T1.  She was taken to the OR last night for debridement, anterior cervical discectomy and fusion.  Operative Gram stain shows gram-positive cocci in clusters.  She was started on empiric vancomycin and piperacillin tazobactam.  She tells me that she is currently living in a motel here in [place].  She says that her son does not know that she is here.  Her most recent drug of choice has been methamphetamine.   Review of Systems: Review of Systems  Constitutional: Positive for malaise/fatigue and weight loss. Negative for chills, diaphoresis and fever.  HENT: Negative for congestion and sore throat.   Respiratory: Negative for cough, sputum production and shortness of breath.   Cardiovascular: Negative for chest pain.  Gastrointestinal: Negative for abdominal pain, diarrhea, nausea and vomiting.  Genitourinary: Negative for dysuria.  Musculoskeletal: Positive for neck pain.  Skin: Positive  for rash.  Psychiatric/Behavioral: Positive for depression and substance abuse. The patient is nervous/anxious.     Past Medical History:  Diagnosis Date  . Anemia   . Depression   . Drug abuse [facility])     Social History   Tobacco Use  . Smoking status: Current Every Day Smoker    Packs/day: 0.50    Years: 34.00    Pack years: 17.00    Types: Cigarettes  . Smokeless tobacco: Never Used  Substance Use Topics  .  Alcohol use: Yes  . Drug use: Yes    Types: IV, Methamphetamines    History reviewed. No pertinent family history. No Known Allergies  OBJECTIVE: Blood pressure 118/85, pulse 98, temperature 98.2 F (36.8 C), temperature source Oral, resp. rate 16, height 5\' 3"  (1.6 m), weight 49.9 kg, SpO2 100 %.  Physical Exam Constitutional:      Appearance: She is ill-appearing.     Comments: She is thin and weak sitting up in bed.  She speaks in a soft, whispered voice.  Neck:     Comments: He has a fresh anterior cervical incision. Cardiovascular:     Rate and Rhythm: Normal rate and regular rhythm.     Heart sounds: No murmur.  Pulmonary:     Effort: Pulmonary effort is normal.     Breath sounds: Normal breath sounds.  Skin:    Comments: She has multiple small scabbed areas and scars on her hands, arms and face.  She has a residual area of induration and erythema between her eyebrows where her facial cellulitis occurred last month.  Psychiatric:        Mood and Affect: Mood normal.     Lab Results Lab Results  Component Value Date   WBC 13.8 (H) 09/29/2019   HGB 12.5 09/29/2019   HCT 38.8 09/29/2019   MCV 86.6 09/29/2019   PLT 316 09/29/2019    Lab Results  Component Value Date   CREATININE 0.49 09/29/2019   BUN 10 09/29/2019   NA 134 (L) 09/29/2019   K 3.8 09/29/2019   CL 99 09/29/2019   CO2 25 09/29/2019    Lab Results  Component Value Date   ALT 15 07/25/2019   AST 18 07/25/2019   ALKPHOS 57 07/25/2019   BILITOT 0.5 07/25/2019     Microbiology: Recent Results (from the past 240 hour(s))  SARS CORONAVIRUS 2 (TAT 6-24 HRS) Nasopharyngeal Nasopharyngeal Swab     Status: None   Collection Time: 09/29/19 11:38 AM   Specimen: Nasopharyngeal Swab  Result Value Ref Range Status   SARS Coronavirus 2 NEGATIVE NEGATIVE Final    Comment: (NOTE) SARS-CoV-2 target nucleic acids are NOT DETECTED. The SARS-CoV-2 RNA is generally detectable in upper and lower respiratory  specimens during the acute phase of infection. Negative results do not preclude SARS-CoV-2 infection, do not rule out co-infections with other pathogens, and should not be used as the sole basis for treatment or other patient management decisions. Negative results must be combined with clinical observations, patient history, and epidemiological information. The expected result is Negative. Fact Sheet for Patients: 11/27/19 Fact Sheet for Healthcare Providers: HairSlick.no This test is not yet approved or cleared by the quierodirigir.com FDA and  has been authorized for detection and/or diagnosis of SARS-CoV-2 by FDA under an Emergency Use Authorization (EUA). This EUA will remain  in effect (meaning this test can be used) for the duration of the COVID-19 declaration under Section 56 4(b)(1) of the Act, 21  U.S.C. section 360bbb-3(b)(1), unless the authorization is terminated or revoked sooner. Performed at [facility], [address] [place], [place] [address]   Aerobic/Anaerobic Culture (surgical/deep wound)     Status: None (Preliminary result)   Collection Time: 09/29/19  9:43 PM   Specimen: Abscess  Result Value Ref Range Status   Specimen Description ABSCESS  Final   Special Requests CERVICAL EPIDURAL  Final   Gram Stain   Final    RARE WBC PRESENT, PREDOMINANTLY PMN FEW GRAM POSITIVE COCCI IN CLUSTERS    Culture   Final    TOO YOUNG TO READ Performed at [facility], [address], [place], [place] [address]    Report Status PENDING  Incomplete    Cliffton Asters, MD [facility] for Infectious Disease [facility] [phone] pager   [phone] cell 09/30/2019, 2:58 PM

## 2019-09-30 NOTE — Anesthesia Postprocedure Evaluation (Signed)
Anesthesia Post Note  Patient: Colleen Ramirez  Procedure(s) Performed: CERVICAL FIVE TO CERVICALSIX ANTERIOR DISCECTOMY AND INSTRUMENTED FUSION (N/A Neck)     Patient location during evaluation: PACU Anesthesia Type: General Level of consciousness: awake and alert Pain management: pain level controlled Vital Signs Assessment: post-procedure vital signs reviewed and stable Respiratory status: spontaneous breathing, nonlabored ventilation, respiratory function stable and patient connected to nasal cannula oxygen Cardiovascular status: blood pressure returned to baseline and stable Postop Assessment: no apparent nausea or vomiting Anesthetic complications: no    Last Vitals:  Vitals:   09/29/19 2333 09/30/19 0315  BP: 114/85 117/87  Pulse: (!) 105 (!) 103  Resp: 16   Temp: 37.7 C 37.1 C  SpO2: 97% 99%    Last Pain:  Vitals:   09/30/19 0440  TempSrc:   PainSc: 10-Worst pain ever                 [name] COKER

## 2019-10-01 DIAGNOSIS — M4322 Fusion of spine, cervical region: Secondary | ICD-10-CM

## 2019-10-01 DIAGNOSIS — A4901 Methicillin susceptible Staphylococcus aureus infection, unspecified site: Secondary | ICD-10-CM | POA: Diagnosis present

## 2019-10-01 DIAGNOSIS — R768 Other specified abnormal immunological findings in serum: Secondary | ICD-10-CM | POA: Diagnosis present

## 2019-10-01 DIAGNOSIS — F1510 Other stimulant abuse, uncomplicated: Secondary | ICD-10-CM

## 2019-10-01 DIAGNOSIS — R52 Pain, unspecified: Secondary | ICD-10-CM

## 2019-10-01 DIAGNOSIS — F320 Major depressive disorder, single episode, mild: Secondary | ICD-10-CM

## 2019-10-01 LAB — CBC WITH DIFFERENTIAL/PLATELET
Abs Immature Granulocytes: 0.1 10*3/uL — ABNORMAL HIGH (ref 0.00–0.07)
Basophils Absolute: 0 10*3/uL (ref 0.0–0.1)
Basophils Relative: 0 %
Eosinophils Absolute: 0 10*3/uL (ref 0.0–0.5)
Eosinophils Relative: 0 %
HCT: 34.2 % — ABNORMAL LOW (ref 36.0–46.0)
Hemoglobin: 11.1 g/dL — ABNORMAL LOW (ref 12.0–15.0)
Immature Granulocytes: 1 %
Lymphocytes Relative: 14 %
Lymphs Abs: 2.1 10*3/uL (ref 0.7–4.0)
MCH: 28 pg (ref 26.0–34.0)
MCHC: 32.5 g/dL (ref 30.0–36.0)
MCV: 86.4 fL (ref 80.0–100.0)
Monocytes Absolute: 1.2 10*3/uL — ABNORMAL HIGH (ref 0.1–1.0)
Monocytes Relative: 8 %
Neutro Abs: 11.5 10*3/uL — ABNORMAL HIGH (ref 1.7–7.7)
Neutrophils Relative %: 77 %
Platelets: 308 10*3/uL (ref 150–400)
RBC: 3.96 MIL/uL (ref 3.87–5.11)
RDW: 15.9 % — ABNORMAL HIGH (ref 11.5–15.5)
WBC: 14.9 10*3/uL — ABNORMAL HIGH (ref 4.0–10.5)
nRBC: 0 % (ref 0.0–0.2)

## 2019-10-01 LAB — BASIC METABOLIC PANEL
Anion gap: 11 (ref 5–15)
BUN: 7 mg/dL (ref 6–20)
CO2: 23 mmol/L (ref 22–32)
Calcium: 8.3 mg/dL — ABNORMAL LOW (ref 8.9–10.3)
Chloride: 101 mmol/L (ref 98–111)
Creatinine, Ser: 0.46 mg/dL (ref 0.44–1.00)
GFR calc Af Amer: >60 mL/min (ref >60)
GFR calc non Af Amer: >60 mL/min (ref >60)
Glucose, Bld: 98 mg/dL (ref 70–99)
Potassium: 4.1 mmol/L (ref 3.5–5.1)
Sodium: 135 mmol/L (ref 135–145)

## 2019-10-01 LAB — C-REACTIVE PROTEIN: CRP: 13.8 mg/dL — ABNORMAL HIGH (ref <1.0)

## 2019-10-01 LAB — MRSA PCR SCREENING: MRSA by PCR: POSITIVE — AB

## 2019-10-01 LAB — HEPATITIS C ANTIBODY: HCV Ab: REACTIVE — AB

## 2019-10-01 LAB — SEDIMENTATION RATE: Sed Rate: 55 mm/hr — ABNORMAL HIGH (ref 0–22)

## 2019-10-01 LAB — HIV ANTIBODY (ROUTINE TESTING W REFLEX): HIV Screen 4th Generation wRfx: NONREACTIVE

## 2019-10-01 MED ORDER — OXYCODONE HCL 5 MG PO TABS
15.0000 mg | ORAL_TABLET | ORAL | Status: DC | PRN
  Administered 2019-10-01 – 2019-10-09 (×41): 15 mg via ORAL
  Filled 2019-10-01 (×41): qty 3

## 2019-10-01 MED ORDER — LORAZEPAM 2 MG/ML IJ SOLN: 0.5000 mg | INTRAMUSCULAR | Status: DC | PRN

## 2019-10-01 MED ORDER — RIFAMPIN 300 MG PO CAPS
300.0000 mg | ORAL_CAPSULE | Freq: Two times a day (BID) | ORAL | Status: DC
  Administered 2019-10-01 – 2019-11-09 (×79): 300 mg via ORAL
  Filled 2019-10-01 (×79): qty 1

## 2019-10-01 MED ORDER — SODIUM CHLORIDE 0.9 % IV BOLUS
500.0000 mL | Freq: Once | INTRAVENOUS | Status: AC
  Administered 2019-10-01: 500 mL via INTRAVENOUS

## 2019-10-01 MED ORDER — HEPARIN SODIUM (PORCINE) 5000 UNIT/ML IJ SOLN
5000.0000 [IU] | Freq: Three times a day (TID) | INTRAMUSCULAR | Status: DC
  Administered 2019-10-02 – 2019-10-09 (×12): 5000 [IU] via SUBCUTANEOUS
  Filled 2019-10-01 (×21): qty 1

## 2019-10-01 NOTE — Plan of Care (Signed)

## 2019-10-01 NOTE — Progress Notes (Signed)
PT Cancellation Note  Patient Details Name: Colleen Ramirez MRN: [id number] DOB: 1981/04/23   Cancelled Treatment:    Reason Eval/Treat Not Completed: Patient declined, no reason specified. Pt reporting that she could not participate in PT session at this time. She stated that she did not want to move because she finally was able to find a position that wasn't "killing" her. PT explained the benefits of mobilization after surgery, and encouraged pt to continue to get OOB throughout the day, to ambulate to and from bathroom with staff members. PT will continue to f/u with pt acutely to progress mobility as tolerated.    Alessandra Bevels Cleta Heatley 10/01/2019, 4:21 PM

## 2019-10-01 NOTE — Progress Notes (Signed)
Orthopedic Tech Progress Note Patient Details:  Colleen Ramirez 1981/03/05 [id number]  Ortho Devices Type of Ortho Device: Soft collar Ortho Device/Splint Interventions: Application   Post Interventions Patient Tolerated: Well Instructions Provided: Care of device   Saul Fordyce 10/01/2019, 11:45 AM

## 2019-10-01 NOTE — Progress Notes (Signed)
Patient ID: Colleen Ramirez, female   DOB: 02-04-1981, 39 y.o.   MRN: [id number]         [facility] for Infectious Disease  Date of Admission:  09/29/2019           Day 2 vancomycin ASSESSMENT: She has acute, severe staph aureus cervical spine infection and is now status post surgical decompression and fusion.  I will add rifampin given the presence of new hardware.  This is going to be very difficult to treat and cure given her homelessness, mental health issues and polysubstance abuse.  PLAN: 1. Continue vancomycin pending antibiotic susceptibility results 2. Start rifampin  Principal Problem:   Infection of cervical spine ([facility]) Active Problems:   Acquired fusion of cervical spine   S/P cervical discectomy   Staph aureus infection   Sepsis ([facility])   Depression   IVDU (intravenous drug user)   Homeless   Anxiety   Status post gastric bypass for obesity   Hepatitis C antibody positive in blood   Scheduled Meds: . [START ON 07/30/2020] docusate sodium  100 mg Oral BID  . feeding supplement (ENSURE ENLIVE)  237 mL Oral BID BM  . heparin injection (subcutaneous)  5,000 Units Subcutaneous Q8H  . LORazepam  0.5 mg Oral Once  . multivitamin with minerals  1 tablet Oral Daily  . nicotine  21 mg Transdermal Daily  . rifampin  300 mg Oral Q12H  . sodium chloride flush  3 mL Intravenous Q12H   Continuous Infusions: . sodium chloride 250 mL (09/30/19 0126)  . vancomycin 1,250 mg (09/30/19 1828)   PRN Meds:.acetaminophen **OR** acetaminophen, cyclobenzaprine, HYDROmorphone (DILAUDID) injection, menthol-cetylpyridinium **OR** phenol, ondansetron **OR** ondansetron (ZOFRAN) IV, oxyCODONE, polyethylene glycol, sodium chloride flush   SUBJECTIVE: She is totally focused on getting more pain medication.  Review of Systems: Review of Systems  Unable to perform ROS: Mental acuity    No Known Allergies  OBJECTIVE: Vitals:   09/30/19 2235 10/01/19 0414 10/01/19 0842 10/01/19  1337  BP: (!) 125/92 110/76 116/86 (!) 103/92  Pulse: 100 89 100 98  Resp: 18 18 18 18   Temp: 98.7 F (37.1 C) 98.3 F (36.8 C) 98.5 F (36.9 C) 98.9 F (37.2 C)  TempSrc:  Oral Oral Oral  SpO2: 98% 100% 100% 100%  Weight:      Height:       Body mass index is 19.49 kg/m.  Physical Exam Constitutional:      Comments: She is lying on her side in bed.  Cardiovascular:     Rate and Rhythm: Regular rhythm. Tachycardia present.     Heart sounds: No murmur.  Pulmonary:     Effort: Pulmonary effort is normal.     Breath sounds: Normal breath sounds.     Lab Results Lab Results  Component Value Date   WBC 14.9 (H) 10/01/2019   HGB 11.1 (L) 10/01/2019   HCT 34.2 (L) 10/01/2019   MCV 86.4 10/01/2019   PLT 308 10/01/2019    Lab Results  Component Value Date   CREATININE 0.46 10/01/2019   BUN 7 10/01/2019   NA 135 10/01/2019   K 4.1 10/01/2019   CL 101 10/01/2019   CO2 23 10/01/2019    Lab Results  Component Value Date   ALT 15 07/25/2019   AST 18 07/25/2019   ALKPHOS 57 07/25/2019   BILITOT 0.5 07/25/2019     Microbiology: Recent Results (from the past 240 hour(s))  SARS CORONAVIRUS 2 (TAT 6-24 HRS) Nasopharyngeal  Nasopharyngeal Swab     Status: None   Collection Time: 09/29/19 11:38 AM   Specimen: Nasopharyngeal Swab  Result Value Ref Range Status   SARS Coronavirus 2 NEGATIVE NEGATIVE Final    Comment: (NOTE) SARS-CoV-2 target nucleic acids are NOT DETECTED. The SARS-CoV-2 RNA is generally detectable in upper and lower respiratory specimens during the acute phase of infection. Negative results do not preclude SARS-CoV-2 infection, do not rule out co-infections with other pathogens, and should not be used as the sole basis for treatment or other patient management decisions. Negative results must be combined with clinical observations, patient history, and epidemiological information. The expected result is Negative. Fact Sheet for  Patients: SugarRoll.be Fact Sheet for Healthcare Providers: https://www.woods-mathews.com/ This test is not yet approved or cleared by the [place] FDA and  has been authorized for detection and/or diagnosis of SARS-CoV-2 by FDA under an Emergency Use Authorization (EUA). This EUA will remain  in effect (meaning this test can be used) for the duration of the COVID-19 declaration under Section 56 4(b)(1) of the Act, 21 U.S.C. section 360bbb-3(b)(1), unless the authorization is terminated or revoked sooner. Performed at [facility], [place] [address] [place], [place] [address]   Aerobic/Anaerobic Culture (surgical/deep wound)     Status: None (Preliminary result)   Collection Time: 09/29/19  9:43 PM   Specimen: Abscess  Result Value Ref Range Status   Specimen Description ABSCESS  Final   Special Requests CERVICAL EPIDURAL  Final   Gram Stain   Final    RARE WBC PRESENT, PREDOMINANTLY PMN FEW GRAM POSITIVE COCCI IN CLUSTERS Performed at [facility], [place] [address], [place], [place] [address]    Culture   Final    ABUNDANT STAPHYLOCOCCUS AUREUS SUSCEPTIBILITIES TO FOLLOW NO ANAEROBES ISOLATED; CULTURE IN PROGRESS FOR 5 DAYS    Report Status PENDING  Incomplete    Michel Bickers, MD [place] for Infectious [facility] [phone] pager   [phone] cell 10/01/2019, 1:44 PM

## 2019-10-01 NOTE — Plan of Care (Signed)

## 2019-10-01 NOTE — Progress Notes (Addendum)
Neurosurgery Service Progress Note  Subjective: No acute events overnight, neck and shoulder pain still significant, no odynophagia or dysphagia  Objective: Vitals:   09/30/19 1440 09/30/19 2041 09/30/19 2235 10/01/19 0414  BP: 118/85 111/77 (!) 125/92 110/76  Pulse: 98 (!) 120 100 89  Resp: 16 18 18 18   Temp: 98.2 F (36.8 C) 97.9 F (36.6 C) 98.7 F (37.1 C) 98.3 F (36.8 C)  TempSrc: Oral   Oral  SpO2: 100% 100% 98% 100%  Weight:      Height:       Temp (24hrs), Avg:98.3 F (36.8 C), Min:97.9 F (36.6 C), Max:98.8 F (37.1 C)  CBC Latest Ref Rng & Units 10/01/2019 09/29/2019 08/24/2019  WBC 4.0 - 10.5 K/uL 14.9(H) 13.8(H) 9.5  Hemoglobin 12.0 - 15.0 g/dL 11.1(L) 12.5 11.8(L)  Hematocrit 36.0 - 46.0 % 34.2(L) 38.8 37.9  Platelets 150 - 400 K/uL 308 316 379   BMP Latest Ref Rng & Units 10/01/2019 09/29/2019 08/24/2019  Glucose 70 - 99 mg/dL 98 111(H) 72  BUN 6 - 20 mg/dL 7 10 9   Creatinine 0.44 - 1.00 mg/dL 0.46 0.49 0.56  Sodium 135 - 145 mmol/L 135 134(L) 140  Potassium 3.5 - 5.1 mmol/L 4.1 3.8 4.0  Chloride 98 - 111 mmol/L 101 99 104  CO2 22 - 32 mmol/L 23 25 28   Calcium 8.9 - 10.3 mg/dL 8.3(L) 8.8(L) 8.8(L)    Intake/Output Summary (Last 24 hours) at 10/01/2019 0750 Last data filed at 10/01/2019 0450 Gross per 24 hour  Intake 738.12 ml  Output --  Net 738.12 ml    Current Facility-Administered Medications:  .  0.9 %  sodium chloride infusion, 250 mL, Intravenous, Continuous, Hilbert Briggs A, MD, Last Rate: 1 mL/hr at 09/30/19 0126, 250 mL at 09/30/19 0126 .  acetaminophen (TYLENOL) tablet 650 mg, 650 mg, Oral, Q4H PRN, 650 mg at 09/30/19 0921 **OR** acetaminophen (TYLENOL) suppository 650 mg, 650 mg, Rectal, Q4H PRN, Judith Part, MD .  cyclobenzaprine (FLEXERIL) tablet 10 mg, 10 mg, Oral, TID PRN, Judith Part, MD, 10 mg at 10/01/19 0118 .  [START ON 07/30/2020] docusate sodium (COLACE) capsule 100 mg, 100 mg, Oral, BID, Rilya Longo A,  MD .  feeding supplement (ENSURE ENLIVE) (ENSURE ENLIVE) liquid 237 mL, 237 mL, Oral, BID BM, Judith Part, MD, 237 mL at 09/30/19 1449 .  HYDROmorphone (DILAUDID) injection 1 mg, 1 mg, Intravenous, Q3H PRN, Judith Part, MD, 1 mg at 10/01/19 0550 .  LORazepam (ATIVAN) tablet 0.5 mg, 0.5 mg, Oral, Once, [place], Wylder S, PA .  menthol-cetylpyridinium (CEPACOL) lozenge 3 mg, 1 lozenge, Oral, PRN **OR** phenol (CHLORASEPTIC) mouth spray 1 spray, 1 spray, Mouth/Throat, PRN, Dayron Odland, Joyice Faster, MD .  multivitamin with minerals tablet 1 tablet, 1 tablet, Oral, Daily, Josey Dettmann, Joyice Faster, MD, 1 tablet at 09/30/19 1212 .  nicotine (NICODERM CQ - dosed in mg/24 hours) patch 21 mg, 21 mg, Transdermal, Daily, Smith, Rondell A, MD, 21 mg at 09/30/19 1655 .  ondansetron (ZOFRAN) tablet 4 mg, 4 mg, Oral, Q6H PRN **OR** ondansetron (ZOFRAN) injection 4 mg, 4 mg, Intravenous, Q6H PRN, Alfonza Toft A, MD .  oxyCODONE (Oxy IR/ROXICODONE) immediate release tablet 15 mg, 15 mg, Oral, Q4H PRN, Gherghe, Costin M, MD .  polyethylene glycol (MIRALAX / GLYCOLAX) packet 17 g, 17 g, Oral, Daily PRN, Ranveer Wahlstrom A, MD .  sodium chloride flush (NS) 0.9 % injection 3 mL, 3 mL, Intravenous, Q12H, Nyemah Watton, Joyice Faster, MD, 3 mL  at 09/30/19 2219 .  sodium chloride flush (NS) 0.9 % injection 3 mL, 3 mL, Intravenous, PRN, Jadene Pierini, MD .  vancomycin (VANCOREADY) IVPB 1250 mg/250 mL, 1,250 mg, Intravenous, Q24H, Juliette Mangle, RPH, Last Rate: 166.7 mL/hr at 09/30/19 1828, 1,250 mg at 09/30/19 1828   Physical Exam: AOx3, PERRL, EOMI, FS, Strength 5/5 x4 distally, pain limited in the bilateral shoulders SILTx4 today Incision c/d/i  Assessment & Plan: 39 y.o. woman w/ polysubstance abuse, severe neck pain, cervical epidural abscess, 1/12 s/p C5-6 ACDF for abscess evacuation. Gram stain w/ GPCs in clusters. Echo w/ no vegetations  -medicine recs, pharmacy recs, narrowed to vanc -can wear a soft  collar for comfort, may help with neck pain, can wear prn -PT/OT rec SNF, pt is homeless, SW consult -SCDs/TEDs, Rodrigo Ran  10/01/19 7:50 AM

## 2019-10-01 NOTE — Progress Notes (Signed)
PROGRESS NOTE  Colleen Ramirez YQM:[id number] DOB: Nov 13, 1980 DOA: 09/29/2019 PCP: Patient, No Pcp Per   LOS: 2 days   Brief Narrative / Interim history: Colleen Ramirez is an 39 y.o. female IV drug use (methamphetamine), anemia, anxiety, depression,  s/p gastric bypass, and homelessness, who initially presented with a 2-day history of severe sharp neck pain yesterday. Patient gives a limited history.  Pain is constant, but worse with any kind of movement.   Admits to last using "Ice" for crystal meth approximately 2 days ago.  Patient on injects into her arms.  Just recently hospitalized from12/1/20-12/8/20 with facial cellulitis.  She was doxycycline 100 mg twice daily  to complete a 10-day course and clindamycin external solution, but it appears not to have completed this.  Subjective / 24h Interval events: Crying in pain, states that her pain is unbearable. She is focused on pain and is not answering much of my other questions.   Assessment & Plan: Principal Problem Sepsis due to cervical epidural abscess -was tachycardic in ER, has elevated WBC -neurosurgery took patient to OR on 1/12 -intraop cultures with GPC -ID following, currently on Vancomycin  -CRP elevated 13.8  Active Problems Polysubstance abuse -mostly meth per patient -TOC consult -not a candidate for PICC line  HCV positive -per ID   Scheduled Meds: . [START ON 07/30/2020] docusate sodium  100 mg Oral BID  . feeding supplement (ENSURE ENLIVE)  237 mL Oral BID BM  . heparin injection (subcutaneous)  5,000 Units Subcutaneous Q8H  . LORazepam  0.5 mg Oral Once  . multivitamin with minerals  1 tablet Oral Daily  . nicotine  21 mg Transdermal Daily  . sodium chloride flush  3 mL Intravenous Q12H   Continuous Infusions: . sodium chloride 250 mL (09/30/19 0126)  . vancomycin 1,250 mg (09/30/19 1828)   PRN Meds:.acetaminophen **OR** acetaminophen, cyclobenzaprine, HYDROmorphone (DILAUDID) injection,  menthol-cetylpyridinium **OR** phenol, ondansetron **OR** ondansetron (ZOFRAN) IV, oxyCODONE, polyethylene glycol, sodium chloride flush  DVT prophylaxis: SCDs Code Status: Full code Family Communication: no fmaily at bedside  Patient admitted from: home  Anticipated d/c place: TBD Barriers to d/c: prolonged IV antibiotics, culture data   Procedures:  CERVICAL FIVE TO CERVICALSIX ANTERIOR DISCECTOMY AND INSTRUMENTED FUSION (N/A) by Dr. Maurice Small 1/12  2d echo IMPRESSIONS  1. Left ventricular ejection fraction, by visual estimation, is 65 to 70%. The left ventricle has hyperdynamic function. There is no left ventricular hypertrophy.  2. Left ventricular diastolic parameters are consistent with Grade I diastolic dysfunction (impaired relaxation).  3. The left ventricle has no regional wall motion abnormalities.  4. Global right ventricle has normal systolic function.The right ventricular size is normal. No increase in right ventricular wall thickness.  5. Left atrial size was normal.  6. Right atrial size was normal.  7. The mitral valve is normal in structure. No evidence of mitral valve regurgitation. No evidence of mitral stenosis.  8. The aortic valve is tricuspid. Aortic valve regurgitation is not visualized. No evidence of aortic valve sclerosis or stenosis.  9. TR signal is inadequate for assessing pulmonary artery systolic pressure. 10. The inferior vena cava is normal in size with greater than 50% respiratory variability, suggesting right atrial pressure of 3 mmHg. 11. The tricuspid valve is normal in structure.  Microbiology  Abscess culture 1/12 - GPC in clusters, pending  SARS CoV 2 1/12 - negative   Antimicrobials: Vancomycin 1/12 >>    Objective: Vitals:   09/30/19 2041 09/30/19 2235 10/01/19 5284 10/01/19 1324  BP: 111/77 (!) 125/92 110/76 116/86  Pulse: (!) 120 100 89 100  Resp: 18 18 18 18   Temp: 97.9 F (36.6 C) 98.7 F (37.1 C) 98.3 F (36.8 C) 98.5 F  (36.9 C)  TempSrc:   Oral Oral  SpO2: 100% 98% 100% 100%  Weight:      Height:        Intake/Output Summary (Last 24 hours) at 10/01/2019 1004 Last data filed at 10/01/2019 0450 Gross per 24 hour  Intake 498.12 ml  Output --  Net 498.12 ml   Filed Weights   09/29/19 2032  Weight: 49.9 kg    Examination:  Constitutional: crying in pain, rocking back and forth  Eyes: no scleral icterus ENMT: Mucous membranes are moist.  Neck: normal, supple. Post surgical scar present  Respiratory: clear to auscultation bilaterally, no wheezing, no crackles. Normal respiratory effort Cardiovascular: Regular rate and rhythm, no murmurs / rubs / gallops. No LE edema. Abdomen: non distended, no tenderness. Bowel sounds positive.  Musculoskeletal: no clubbing / cyanosis.  Neurologic: non focal    Data Reviewed: I have independently reviewed following labs and imaging studies   CBC: Recent Labs  Lab 09/29/19 1214 10/01/19 0445  WBC 13.8* 14.9*  NEUTROABS 11.7* 11.5*  HGB 12.5 11.1*  HCT 38.8 34.2*  MCV 86.6 86.4  PLT 316 016   Basic Metabolic Panel: Recent Labs  Lab 09/29/19 1214 10/01/19 0445  NA 134* 135  K 3.8 4.1  CL 99 101  CO2 25 23  GLUCOSE 111* 98  BUN 10 7  CREATININE 0.49 0.46  CALCIUM 8.8* 8.3*   Liver Function Tests: No results for input(s): AST, ALT, ALKPHOS, BILITOT, PROT, ALBUMIN in the last 168 hours. Coagulation Profile: No results for input(s): INR, PROTIME in the last 168 hours. HbA1C: No results for input(s): HGBA1C in the last 72 hours. CBG: No results for input(s): GLUCAP in the last 168 hours.  Recent Results (from the past 240 hour(s))  SARS CORONAVIRUS 2 (TAT 6-24 HRS) Nasopharyngeal Nasopharyngeal Swab     Status: None   Collection Time: 09/29/19 11:38 AM   Specimen: Nasopharyngeal Swab  Result Value Ref Range Status   SARS Coronavirus 2 NEGATIVE NEGATIVE Final    Comment: (NOTE) SARS-CoV-2 target nucleic acids are NOT DETECTED. The  SARS-CoV-2 RNA is generally detectable in upper and lower respiratory specimens during the acute phase of infection. Negative results do not preclude SARS-CoV-2 infection, do not rule out co-infections with other pathogens, and should not be used as the sole basis for treatment or other patient management decisions. Negative results must be combined with clinical observations, patient history, and epidemiological information. The expected result is Negative. Fact Sheet for Patients: SugarRoll.be Fact Sheet for Healthcare Providers: https://www.woods-mathews.com/ This test is not yet approved or cleared by the [place] FDA and  has been authorized for detection and/or diagnosis of SARS-CoV-2 by FDA under an Emergency Use Authorization (EUA). This EUA will remain  in effect (meaning this test can be used) for the duration of the COVID-19 declaration under Section 56 4(b)(1) of the Act, 21 U.S.C. section 360bbb-3(b)(1), unless the authorization is terminated or revoked sooner. Performed at [facility], [place] [address], [place], [place] [address]   Aerobic/Anaerobic Culture (surgical/deep wound)     Status: None (Preliminary result)   Collection Time: 09/29/19  9:43 PM   Specimen: Abscess  Result Value Ref Range Status   Specimen Description ABSCESS  Final   Special Requests CERVICAL  EPIDURAL  Final   Gram Stain   Final    RARE WBC PRESENT, PREDOMINANTLY PMN FEW GRAM POSITIVE COCCI IN CLUSTERS    Culture   Final    TOO YOUNG TO READ Performed at [facility], [address], [place], [place] [address]    Report Status PENDING  Incomplete     Radiology Studies: ECHOCARDIOGRAM COMPLETE  Result Date: 09/30/2019   ECHOCARDIOGRAM REPORT   Patient Name:   SAMANTHAN DUGO Date of Exam: 09/30/2019 Medical Rec #:  [id number]      Height:       63.0 in Accession #:    [id number]     Weight:       110.0 lb Date of Birth:  07-14-1981       BSA:          1.50 m Patient Age:    38 years       BP:           115/87 mmHg Patient Gender: F              HR:           117 bpm. Exam Location:  Inpatient Procedure: 2D Echo, Cardiac Doppler and Color Doppler Indications:    Epidural Abscess [id number]  History:        Patient has no prior history of Echocardiogram examinations.                 Drug abuse.  Sonographer:    Tiffany Dance Referring Phys: [id number] RONDELL A SMITH IMPRESSIONS  1. Left ventricular ejection fraction, by visual estimation, is 65 to 70%. The left ventricle has hyperdynamic function. There is no left ventricular hypertrophy.  2. Left ventricular diastolic parameters are consistent with Grade I diastolic dysfunction (impaired relaxation).  3. The left ventricle has no regional wall motion abnormalities.  4. Global right ventricle has normal systolic function.The right ventricular size is normal. No increase in right ventricular wall thickness.  5. Left atrial size was normal.  6. Right atrial size was normal.  7. The mitral valve is normal in structure. No evidence of mitral valve regurgitation. No evidence of mitral stenosis.  8. The aortic valve is tricuspid. Aortic valve regurgitation is not visualized. No evidence of aortic valve sclerosis or stenosis.  9. TR signal is inadequate for assessing pulmonary artery systolic pressure. 10. The inferior vena cava is normal in size with greater than 50% respiratory variability, suggesting right atrial pressure of 3 mmHg. 11. The tricuspid valve is normal in structure. FINDINGS  Left Ventricle: Left ventricular ejection fraction, by visual estimation, is 65 to 70%. The left ventricle has hyperdynamic function. The left ventricle has no regional wall motion abnormalities. The left ventricular internal cavity size was the left ventricle is normal in size. There is no left ventricular hypertrophy. Left ventricular diastolic parameters are consistent with Grade I diastolic dysfunction (impaired  relaxation). Right Ventricle: The right ventricular size is normal. No increase in right ventricular wall thickness. Global RV systolic function is has normal systolic function. Left Atrium: Left atrial size was normal in size. Right Atrium: Right atrial size was normal in size Pericardium: There is no evidence of pericardial effusion. Mitral Valve: The mitral valve is normal in structure. No evidence of mitral valve regurgitation. No evidence of mitral valve stenosis by observation. Tricuspid Valve: The tricuspid valve is normal in structure. Tricuspid valve regurgitation is not demonstrated. Aortic Valve: The aortic valve is tricuspid. Aortic  valve regurgitation is not visualized. The aortic valve is structurally normal, with no evidence of sclerosis or stenosis. Pulmonic Valve: The pulmonic valve was normal in structure. Pulmonic valve regurgitation is not visualized. Pulmonic regurgitation is not visualized. Aorta: The aortic root is normal in size and structure. Venous: The inferior vena cava is normal in size with greater than 50% respiratory variability, suggesting right atrial pressure of 3 mmHg. IAS/Shunts: No atrial level shunt detected by color flow Doppler.  LEFT VENTRICLE PLAX 2D LVIDd:         4.36 cm  Diastology LVIDs:         3.01 cm  LV e' lateral: 10.40 cm/s LV PW:         0.80 cm  LV e' medial:  11.20 cm/s LV IVS:        0.75 cm LVOT diam:     1.80 cm LV SV:         51 ml LV SV Index:   34.02 LVOT Area:     2.54 cm  RIGHT VENTRICLE             IVC RV Basal diam:  2.32 cm     IVC diam: 1.42 cm RV S prime:     23.70 cm/s TAPSE (M-mode): 2.7 cm LEFT ATRIUM             Index       RIGHT ATRIUM          Index LA diam:        2.40 cm 1.60 cm/m  RA Area:     9.43 cm LA Vol (A2C):   42.2 ml 28.14 ml/m RA Volume:   16.40 ml 10.93 ml/m LA Vol (A4C):   38.4 ml 25.60 ml/m LA Biplane Vol: 40.2 ml 26.80 ml/m  AORTIC VALVE LVOT Vmax:   106.00 cm/s LVOT Vmean:  72.500 cm/s LVOT VTI:    0.220 m  AORTA Ao  Root diam: 3.40 cm Ao Asc diam:  3.00 cm MV A velocity: 96.80 cm/s 70.3 cm/s                                     SHUNTS                                     Systemic VTI:  0.22 m                                     Systemic Diam: 1.80 cm  Marca Ancona MD Electronically signed by Marca Ancona MD Signature Date/Time: 09/30/2019/4:43:30 PM    Final     Pamella Pert, MD, PhD [facility]  Between 7 am - 7 pm I am available, please contact me via Amion or Securechat  Between 7 pm - 7 am I am not available, please contact night coverage MD/APP via Amion

## 2019-10-02 DIAGNOSIS — R000 Tachycardia, unspecified: Secondary | ICD-10-CM

## 2019-10-02 DIAGNOSIS — B9562 Methicillin resistant Staphylococcus aureus infection as the cause of diseases classified elsewhere: Secondary | ICD-10-CM

## 2019-10-02 LAB — BASIC METABOLIC PANEL
Anion gap: 9 (ref 5–15)
BUN: 5 mg/dL — ABNORMAL LOW (ref 6–20)
CO2: 27 mmol/L (ref 22–32)
Calcium: 8.7 mg/dL — ABNORMAL LOW (ref 8.9–10.3)
Chloride: 101 mmol/L (ref 98–111)
Creatinine, Ser: 0.43 mg/dL — ABNORMAL LOW (ref 0.44–1.00)
GFR calc Af Amer: >60 mL/min (ref >60)
GFR calc non Af Amer: >60 mL/min (ref >60)
Glucose, Bld: 102 mg/dL — ABNORMAL HIGH (ref 70–99)
Potassium: 3.6 mmol/L (ref 3.5–5.1)
Sodium: 137 mmol/L (ref 135–145)

## 2019-10-02 LAB — CBC
HCT: 35.1 % — ABNORMAL LOW (ref 36.0–46.0)
Hemoglobin: 11.3 g/dL — ABNORMAL LOW (ref 12.0–15.0)
MCH: 27.7 pg (ref 26.0–34.0)
MCHC: 32.2 g/dL (ref 30.0–36.0)
MCV: 86 fL (ref 80.0–100.0)
Platelets: 401 10*3/uL — ABNORMAL HIGH (ref 150–400)
RBC: 4.08 MIL/uL (ref 3.87–5.11)
RDW: 15.7 % — ABNORMAL HIGH (ref 11.5–15.5)
WBC: 10.4 10*3/uL (ref 4.0–10.5)
nRBC: 0 % (ref 0.0–0.2)

## 2019-10-02 LAB — LACTIC ACID, PLASMA: Lactic Acid, Venous: 1.6 mmol/L (ref 0.5–1.9)

## 2019-10-02 LAB — HCV RNA QUANT: HCV Quantitative: <15 IU/mL — ABNORMAL LOW (ref >50)

## 2019-10-02 MED ORDER — LIDOCAINE 5 % EX PTCH
1.0000 | MEDICATED_PATCH | Freq: Every day | CUTANEOUS | Status: DC
  Administered 2019-10-11 – 2019-10-22 (×10): 1 via TRANSDERMAL
  Filled 2019-10-02 (×21): qty 1

## 2019-10-02 MED ORDER — DEXAMETHASONE SODIUM PHOSPHATE 4 MG/ML IJ SOLN
4.0000 mg | Freq: Once | INTRAMUSCULAR | Status: AC
  Administered 2019-10-02: 4 mg via INTRAVENOUS
  Filled 2019-10-02: qty 1

## 2019-10-02 MED ORDER — MUPIROCIN 2 % EX OINT
TOPICAL_OINTMENT | Freq: Two times a day (BID) | CUTANEOUS | Status: DC
  Filled 2019-10-02 (×3): qty 22

## 2019-10-02 NOTE — Progress Notes (Signed)
PT Cancellation Note  Patient Details Name: Ovella Manygoats MRN: [id number] DOB: 12-19-1980   Cancelled Treatment:    Reason Eval/Treat Not Completed: Patient declined, no reason specified;Pain limiting ability to participate. Pt declining treatment, stating "i'm not going to move" She reports pain in her back and neck limiting her ability to participate. Will check back as time allows.   Kallie Locks, PTA Pager [phone] Acute Rehab   Sheral Apley 10/02/2019, 1:48 PM

## 2019-10-02 NOTE — Progress Notes (Signed)
OT Cancellation Note  Patient Details Name: Colleen Ramirez MRN: [id number] DOB: Sep 06, 1981   Cancelled Treatment:    Reason Eval/Treat Not Completed: Other (comment).  Attempted skilled OT treament session.  Pt. Declines stating she is 10/10 pain.  Reviewed repositioning can aide in decreasing pain and increasing comfort.  States she had just gotten back to bed and "is staying just like this".  Reports feeling hot states room is too hot and that is another reason she can not participate in any movement.  Pt. With small fan aimed at her and temp. In room is at the lowest setting on thermostat.  Reviewed benefits of participation in therapy.  She continued to decline.  Will attempt back as able.    Evangeline Gula Lorraine-COTA/L 10/02/2019, 10:44 AM

## 2019-10-02 NOTE — Progress Notes (Signed)
Neurosurgery Service Progress Note  Subjective: No acute events overnight, still having significant neck pain, some odynophagia, starting to have more symptoms of dysphagia, but still able to swallow food  Objective: Vitals:   10/01/19 2253 10/02/19 0016 10/02/19 0338 10/02/19 0759  BP: (!) 134/105 (!) 134/104 112/81 119/89  Pulse: (!) 119 (!) 108 (!) 101 (!) 102  Resp: 17 17 18 18   Temp:  98.2 F (36.8 C) 97.8 F (36.6 C) 99.7 F (37.6 C)  TempSrc:  Oral  Oral  SpO2: 99% 99% 99% 100%  Weight:      Height:       Temp (24hrs), Avg:98.6 F (37 C), Min:97.8 F (36.6 C), Max:99.7 F (37.6 C)  CBC Latest Ref Rng & Units 10/02/2019 10/01/2019 09/29/2019  WBC 4.0 - 10.5 K/uL 10.4 14.9(H) 13.8(H)  Hemoglobin 12.0 - 15.0 g/dL 11.3(L) 11.1(L) 12.5  Hematocrit 36.0 - 46.0 % 35.1(L) 34.2(L) 38.8  Platelets 150 - 400 K/uL 401(H) 308 316   BMP Latest Ref Rng & Units 10/02/2019 10/01/2019 09/29/2019  Glucose 70 - 99 mg/dL 102(H) 98 111(H)  BUN 6 - 20 mg/dL 5(L) 7 10  Creatinine 0.44 - 1.00 mg/dL 0.43(L) 0.46 0.49  Sodium 135 - 145 mmol/L 137 135 134(L)  Potassium 3.5 - 5.1 mmol/L 3.6 4.1 3.8  Chloride 98 - 111 mmol/L 101 101 99  CO2 22 - 32 mmol/L 27 23 25   Calcium 8.9 - 10.3 mg/dL 8.7(L) 8.3(L) 8.8(L)    Intake/Output Summary (Last 24 hours) at 10/02/2019 0840 Last data filed at 10/02/2019 0503 Gross per 24 hour  Intake 1716.92 ml  Output -  Net 1716.92 ml    Current Facility-Administered Medications:  .  0.9 %  sodium chloride infusion, 250 mL, Intravenous, Continuous, Lan Entsminger A, MD, Last Rate: 1 mL/hr at 09/30/19 0126, 250 mL at 09/30/19 0126 .  acetaminophen (TYLENOL) tablet 650 mg, 650 mg, Oral, Q4H PRN, 650 mg at 10/01/19 2346 **OR** acetaminophen (TYLENOL) suppository 650 mg, 650 mg, Rectal, Q4H PRN, Judith Part, MD .  cyclobenzaprine (FLEXERIL) tablet 10 mg, 10 mg, Oral, TID PRN, Judith Part, MD, 10 mg at 10/01/19 2341 .  [START ON 07/30/2020] docusate  sodium (COLACE) capsule 100 mg, 100 mg, Oral, BID, Lola Czerwonka A, MD .  feeding supplement (ENSURE ENLIVE) (ENSURE ENLIVE) liquid 237 mL, 237 mL, Oral, BID BM, Judith Part, MD, 237 mL at 10/01/19 1304 .  heparin injection 5,000 Units, 5,000 Units, Subcutaneous, Q8H, Makynna Manocchio A, MD .  HYDROmorphone (DILAUDID) injection 1 mg, 1 mg, Intravenous, Q3H PRN, Judith Part, MD, 1 mg at 10/02/19 0645 .  lidocaine (LIDODERM) 5 % 1 patch, 1 patch, Transdermal, Daily, Vann, Jessica U, DO .  LORazepam (ATIVAN) injection 0.5 mg, 0.5 mg, Intravenous, Q4H PRN, Eubanks, Katalina M, NP .  LORazepam (ATIVAN) tablet 0.5 mg, 0.5 mg, Oral, Once, [place], Wylder S, PA .  menthol-cetylpyridinium (CEPACOL) lozenge 3 mg, 1 lozenge, Oral, PRN **OR** phenol (CHLORASEPTIC) mouth spray 1 spray, 1 spray, Mouth/Throat, PRN, Darreon Lutes, Joyice Faster, MD .  multivitamin with minerals tablet 1 tablet, 1 tablet, Oral, Daily, Judith Part, MD, 1 tablet at 10/01/19 0801 .  nicotine (NICODERM CQ - dosed in mg/24 hours) patch 21 mg, 21 mg, Transdermal, Daily, Tamala Julian, Rondell A, MD, 21 mg at 10/01/19 0802 .  ondansetron (ZOFRAN) tablet 4 mg, 4 mg, Oral, Q6H PRN **OR** ondansetron (ZOFRAN) injection 4 mg, 4 mg, Intravenous, Q6H PRN, Judith Part, MD .  oxyCODONE (Oxy IR/ROXICODONE) immediate release tablet 15 mg, 15 mg, Oral, Q4H PRN, Leatha Gilding, MD, 15 mg at 10/02/19 0502 .  polyethylene glycol (MIRALAX / GLYCOLAX) packet 17 g, 17 g, Oral, Daily PRN, Ankita Newcomer A, MD .  rifampin (RIFADIN) capsule 300 mg, 300 mg, Oral, Q12H, Cliffton Asters, MD, 300 mg at 10/01/19 2232 .  sodium chloride flush (NS) 0.9 % injection 3 mL, 3 mL, Intravenous, Q12H, Riki Berninger, Clovis Pu, MD, 3 mL at 10/01/19 2241 .  sodium chloride flush (NS) 0.9 % injection 3 mL, 3 mL, Intravenous, PRN, Jadene Pierini, MD .  vancomycin (VANCOREADY) IVPB 1250 mg/250 mL, 1,250 mg, Intravenous, Q24H, Juliette Mangle, RPH, Last  Rate: 166.7 mL/hr at 10/01/19 1819, 1,250 mg at 10/01/19 1819   Physical Exam: AOx3, PERRL, EOMI, FS, Strength 5/5 x4 distally, pain limited in the bilateral shoulders SILTx4 Incision c/d/i Able to swallow saliva, voice pitch appears stable Some new scabbing on the anterolateral nose  Assessment & Plan: 39 y.o. woman w/ polysubstance abuse, severe neck pain, cervical epidural abscess, 1/12 s/p C5-6 ACDF for abscess evacuation. Gram stain w/ GPCs in clusters. Echo w/ no vegetations  -medicine recs, ID recs, pharmacy recs, narrowed to vanc, added rifampin -will give one dose of dexamethasone for dysphagia, given the significant extent of her prevertebral abscess and myositis, I'm not surprised that she's having some dysphagia. Will try to minimize any further steroids given her infection, but if she becomes NPO she will lose nutritional intake -I counseled her that, unfortunately, her neck is going to still hurt while the infection is healing -can wear a soft collar for comfort, may help with neck pain, can wear prn -PT/OT rec SNF, pt is homeless, SW recs for placement -SCDs/TEDs, Rodrigo Ran  10/02/19 8:40 AM

## 2019-10-02 NOTE — Progress Notes (Addendum)
Patient ID: Colleen Ramirez, female   DOB: 12/25/1980, 39 y.o.   MRN: [id number]         [facility]  Date of Admission:  09/29/2019           Day 3 vancomycin and rifampin ASSESSMENT: She has acute, severe MRSA cervical spine infection and is now status post surgical decompression and fusion.  This is going to be very difficult to treat and cure given her homelessness, mental health issues and polysubstance abuse.  Ideally she needs a minimum of 6 weeks of IV based antibiotic therapy in a supervised setting.  PLAN: 1. Continue current antibiotics  2. Please call Dr. Dietrich [place] [phone]) for any infectious disease questions this weekend  Principal Problem:   Infection of cervical spine ([place]) Active Problems:   Acquired fusion of cervical spine   S/P cervical discectomy   Staph aureus infection   Sepsis ([place])   Depression   IVDU (intravenous drug user)   Homeless   Anxiety   Status post gastric bypass for obesity   Hepatitis C antibody positive in blood   Scheduled Meds: . [START ON 07/30/2020] docusate sodium  100 mg Oral BID  . feeding supplement (ENSURE ENLIVE)  237 mL Oral BID BM  . heparin injection (subcutaneous)  5,000 Units Subcutaneous Q8H  . lidocaine  1 patch Transdermal Daily  . LORazepam  0.5 mg Oral Once  . multivitamin with minerals  1 tablet Oral Daily  . mupirocin ointment   Nasal BID  . nicotine  21 mg Transdermal Daily  . rifampin  300 mg Oral Q12H  . sodium chloride flush  3 mL Intravenous Q12H   Continuous Infusions: . sodium chloride 250 mL (09/30/19 0126)  . vancomycin 1,250 mg (10/01/19 1819)   PRN Meds:.acetaminophen **OR** acetaminophen, cyclobenzaprine, HYDROmorphone (DILAUDID) injection, LORazepam, menthol-cetylpyridinium **OR** phenol, ondansetron **OR** ondansetron (ZOFRAN) IV, oxyCODONE, polyethylene glycol, sodium chloride flush   SUBJECTIVE: She is totally focused on getting more pain  medication.  Review of Systems: Review of Systems  Unable to perform ROS: Mental acuity    No Known Allergies  OBJECTIVE: Vitals:   10/01/19 2253 10/02/19 0016 10/02/19 0338 10/02/19 0759  BP: (!) 134/105 (!) 134/104 112/81 119/89  Pulse: (!) 119 (!) 108 (!) 101 (!) 102  Resp: 17 17 18 18   Temp:  98.2 F (36.8 C) 97.8 F (36.6 C) 99.7 F (37.6 C)  TempSrc:  Oral  Oral  SpO2: 99% 99% 99% 100%  Weight:      Height:       Body mass index is 19.49 kg/m.  Physical Exam Constitutional:      Comments: She is lying on her side in bed.  Cardiovascular:     Rate and Rhythm: Regular rhythm. Tachycardia present.     Heart sounds: No murmur.  Pulmonary:     Effort: Pulmonary effort is normal.     Breath sounds: Normal breath sounds.     Lab Results Lab Results  Component Value Date   WBC 10.4 10/02/2019   HGB 11.3 (L) 10/02/2019   HCT 35.1 (L) 10/02/2019   MCV 86.0 10/02/2019   PLT 401 (H) 10/02/2019    Lab Results  Component Value Date   CREATININE 0.43 (L) 10/02/2019   BUN 5 (L) 10/02/2019   NA 137 10/02/2019   K 3.6 10/02/2019   CL 101 10/02/2019   CO2 27 10/02/2019    Lab Results  Component Value Date  ALT 15 07/25/2019   AST 18 07/25/2019   ALKPHOS 57 07/25/2019   BILITOT 0.5 07/25/2019     Microbiology: Recent Results (from the past 240 hour(s))  SARS CORONAVIRUS 2 (TAT 6-24 HRS) Nasopharyngeal Nasopharyngeal Swab     Status: None   Collection Time: 09/29/19 11:38 AM   Specimen: Nasopharyngeal Swab  Result Value Ref Range Status   SARS Coronavirus 2 NEGATIVE NEGATIVE Final    Comment: (NOTE) SARS-CoV-2 target nucleic acids are NOT DETECTED. The SARS-CoV-2 RNA is generally detectable in upper and lower respiratory specimens during the acute phase of infection. Negative results do not preclude SARS-CoV-2 infection, do not rule out co-infections with other pathogens, and should not be used as the sole basis for treatment or other patient management  decisions. Negative results must be combined with clinical observations, patient history, and epidemiological information. The expected result is Negative. Fact Sheet for Patients: HairSlick.no Fact Sheet for Healthcare Providers: quierodirigir.com This test is not yet approved or cleared by the [place] FDA and  has been authorized for detection and/or diagnosis of SARS-CoV-2 by FDA under an Emergency Use Authorization (EUA). This EUA will remain  in effect (meaning this test can be used) for the duration of the COVID-19 declaration under Section 56 4(b)(1) of the Act, 21 U.S.C. section 360bbb-3(b)(1), unless the authorization is terminated or revoked sooner. Performed at [facility], [address], [place], [place] [address]   Aerobic/Anaerobic Culture (surgical/deep wound)     Status: None (Preliminary result)   Collection Time: 09/29/19  9:43 PM   Specimen: Abscess  Result Value Ref Range Status   Specimen Description ABSCESS  Final   Special Requests CERVICAL EPIDURAL  Final   Gram Stain   Final    RARE WBC PRESENT, PREDOMINANTLY PMN FEW GRAM POSITIVE COCCI IN CLUSTERS Performed at [facility], [address], [place], [place] [address]    Culture   Final    ABUNDANT STAPHYLOCOCCUS AUREUS SUSCEPTIBILITIES TO FOLLOW NO ANAEROBES ISOLATED; CULTURE IN PROGRESS FOR 5 DAYS    Report Status PENDING  Incomplete  MRSA PCR Screening     Status: Abnormal   Collection Time: 10/01/19  6:23 PM   Specimen: Nasal Mucosa; Nasopharyngeal  Result Value Ref Range Status   MRSA by PCR POSITIVE (A) NEGATIVE Final    Comment:        The GeneXpert MRSA Assay (FDA approved for NASAL specimens only), is one component of a comprehensive MRSA colonization surveillance program. It is not intended to diagnose MRSA infection nor to guide or monitor treatment for MRSA infections. RESULT CALLED TO, READ BACK BY AND  VERIFIED WITH: A NEZIUS RN 10/01/19 2000 JDW Performed at [facility], [address], [place], [place] [address]     Cliffton Asters, MD [facility] [phone] pager   [phone] cell 10/02/2019, 9:13 AM

## 2019-10-02 NOTE — Progress Notes (Signed)
PROGRESS NOTE  Colleen Ramirez VPX:[id number] DOB: [birth year] DOA: 09/29/2019 PCP: Patient, No Pcp Per   LOS: 3 days   Brief Narrative / Interim history: Colleen Ramirez is an 39 y.o. female IV drug use (methamphetamine), anemia, anxiety, depression,  s/p gastric bypass, and homelessness, who initially presented with a 2-day history of severe sharp neck pain yesterday. Patient gives a limited history.  Pain is constant, but worse with any kind of movement.   Admits to last using "[employer]" for crystal meth approximately 2 days ago.  Patient on injects into her arms.  Just recently hospitalized from12/1/20-12/8/20 with facial cellulitis.  She was doxycycline 100 mg twice daily  to complete a 10-day course and clindamycin external solution, but it appears not to have completed this.  Will take over care from NS but they will stay on with daily notes.     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 -neurosurgery took patient to OR on 1/12 -intraop cultures with [facility]: ABUNDANT STAPHYLOCOCCUS AUREUS -ID following, currently on Vancomycin and rifampin -CRP elevated 13.8 elevated from 4.7 1 month ago - add Bactroban to her nasal passages -will need a minimum of 6 weeks of IV abx -discussed goal is not to be pain free and it will take time for pain to improve with abx  Dysphagia -decadron x 1 per NS  Polysubstance abuse -mostly meth per patient -TOC consult -not a candidate for PICC line due to IV drug abuse  HCV positive -per ID  TOC has been consulted given her homeless status, drug abuse and need for 6 weeks of abx   Scheduled Meds: . dexamethasone (DECADRON) injection  4 mg Intravenous Once  . [START ON 07/30/2020] docusate sodium  100 mg Oral BID  . feeding supplement (ENSURE ENLIVE)  237 mL Oral BID BM  . heparin injection (subcutaneous)  5,000 Units Subcutaneous Q8H  . lidocaine  1 patch  Transdermal Daily  . LORazepam  0.5 mg Oral Once  . multivitamin with minerals  1 tablet Oral Daily  . mupirocin ointment   Nasal BID  . nicotine  21 mg Transdermal Daily  . rifampin  300 mg Oral Q12H  . sodium chloride flush  3 mL Intravenous Q12H   Continuous Infusions: . sodium chloride 250 mL (09/30/19 0126)  . vancomycin 1,250 mg (10/01/19 1819)   PRN Meds:.acetaminophen **OR** acetaminophen, cyclobenzaprine, HYDROmorphone (DILAUDID) injection, LORazepam, menthol-cetylpyridinium **OR** phenol, ondansetron **OR** ondansetron (ZOFRAN) IV, oxyCODONE, polyethylene glycol, sodium chloride flush  DVT prophylaxis: SCDs Code Status: Full code Family Communication: no fmaily at bedside  Patient admitted from: home  Anticipated d/c place: TBD Barriers to d/c: prolonged IV antibiotics, culture data   Procedures:  CERVICAL FIVE TO CERVICALSIX ANTERIOR DISCECTOMY AND INSTRUMENTED FUSION (N/A) by Dr. Zada Finders 1/12  2d echo IMPRESSIONS  1. Left ventricular ejection fraction, by visual estimation, is 65 to 70%. The left ventricle has hyperdynamic function. There is no left ventricular hypertrophy.  2. Left ventricular diastolic parameters are consistent with Grade I diastolic dysfunction (impaired relaxation).  3. The left ventricle has no regional wall motion abnormalities.  4. Global right ventricle has normal systolic function.The right ventricular size is normal. No increase in right ventricular wall thickness.  5. Left atrial size was normal.  6. Right atrial size was normal.  7. The mitral valve is normal in structure. No evidence of mitral valve regurgitation. No evidence of mitral stenosis.  8. The aortic valve is tricuspid. Aortic valve regurgitation is not visualized. No evidence of aortic valve sclerosis or stenosis.  9. TR signal is inadequate for assessing pulmonary artery systolic pressure. 10. The inferior vena cava is normal in size with greater than 50% respiratory  variability, suggesting right atrial pressure of 3 mmHg. 11. The tricuspid valve is normal in structure.       Objective: Vitals:   10/01/19 2253 10/02/19 0016 10/02/19 0338 10/02/19 0759  BP: (!) 134/105 (!) 134/104 112/81 119/89  Pulse: (!) 119 (!) 108 (!) 101 (!) 102  Resp: 17 17 18 18   Temp:  98.2 F (36.8 C) 97.8 F (36.6 C) 99.7 F (37.6 C)  TempSrc:  Oral  Oral  SpO2: 99% 99% 99% 100%  Weight:      Height:        Intake/Output Summary (Last 24 hours) at 10/02/2019 1001 Last data filed at 10/02/2019 0941 Gross per 24 hour  Intake 1716.92 ml  Output --  Net 1716.92 ml   Filed Weights   09/29/19 2032  Weight: 49.9 kg    Examination:  In bed, soft collar on neck Weeping wounds in nares and spread to skin Mildly tachy +BS, soft, NT Alert Chronically ill appearing   Data Reviewed: I have independently reviewed following labs and imaging studies   CBC: Recent Labs  Lab 09/29/19 1214 10/01/19 0445 10/02/19 0342  WBC 13.8* 14.9* 10.4  NEUTROABS 11.7* 11.5*  --   HGB 12.5 11.1* 11.3*  HCT 38.8 34.2* 35.1*  MCV 86.6 86.4 86.0  PLT 316 308 401*   Basic Metabolic Panel: Recent Labs  Lab 09/29/19 1214 10/01/19 0445 10/02/19 0342  NA 134* 135 137  K 3.8 4.1 3.6  CL 99 101 101  CO2 25 23 27   GLUCOSE 111* 98 102*  BUN 10 7 5*  CREATININE 0.49 0.46 0.43*  CALCIUM 8.8* 8.3* 8.7*   Liver Function Tests: No results for input(s): AST, ALT, ALKPHOS, BILITOT, PROT, ALBUMIN in the last 168 hours. Coagulation Profile: No results for input(s): INR, PROTIME in the last 168 hours. HbA1C: No results for input(s): HGBA1C in the last 72 hours. CBG: No results for input(s): GLUCAP in the last 168 hours.  Recent Results (from the past 240 hour(s))  SARS CORONAVIRUS 2 (TAT 6-24 HRS) Nasopharyngeal Nasopharyngeal Swab     Status: None   Collection Time: 09/29/19 11:38 AM   Specimen: Nasopharyngeal Swab  Result Value Ref Range Status   SARS Coronavirus 2  NEGATIVE NEGATIVE Final    Comment: (NOTE) SARS-CoV-2 target nucleic acids are NOT DETECTED. The SARS-CoV-2 RNA is generally detectable in upper and lower respiratory specimens during the acute phase of infection. Negative results do not preclude SARS-CoV-2 infection, do not rule out co-infections with other pathogens, and should not be used as the sole basis for treatment or other patient management decisions. Negative results must be combined with clinical observations, patient history, and epidemiological information. The expected result is Negative. Fact Sheet for Patients: Fact Sheet for Healthcare Providers: 11/27/19 This test is not yet approved or cleared by the [employer] FDA and  has been authorized for detection and/or diagnosis of SARS-CoV-2 by FDA under an Emergency Use Authorization (EUA). This EUA will remain  in effect (meaning this test can be used) for the duration of the COVID-19 declaration under Section 56 4(b)(1) of the Act, 21 U.S.C. section 360bbb-3(b)(1), unless the authorization is terminated or revoked sooner. Performed at [facility], [address], [place], [place] [address]   Aerobic/Anaerobic Culture (surgical/deep wound)     Status: None (Preliminary result)   Collection Time: 09/29/19  9:43 PM   Specimen: Abscess  Result Value Ref Range Status   Specimen Description ABSCESS  Final   Special Requests CERVICAL EPIDURAL  Final   Gram Stain   Final    RARE WBC PRESENT, PREDOMINANTLY PMN FEW GRAM POSITIVE COCCI IN CLUSTERS Performed at [facility], [address] [place], [place] [address]    Culture   Final    ABUNDANT STAPHYLOCOCCUS AUREUS SUSCEPTIBILITIES TO FOLLOW NO ANAEROBES ISOLATED; CULTURE IN PROGRESS FOR 5 DAYS    Report Status PENDING  Incomplete  MRSA PCR Screening     Status: Abnormal   Collection Time: 10/01/19  6:23 PM   Specimen: Nasal  Mucosa; Nasopharyngeal  Result Value Ref Range Status   MRSA by PCR POSITIVE (A) NEGATIVE Final    Comment:        The GeneXpert MRSA Assay (FDA approved for NASAL specimens only), is one component of a comprehensive MRSA colonization surveillance program. It is not intended to diagnose MRSA infection nor to guide or monitor treatment for MRSA infections. RESULT CALLED TO, READ BACK BY AND VERIFIED WITH: A NEZIUS RN 10/01/19 2000 JDW Performed at [facility], [address] [place], [place] [address]      Marlin Canary DO [facility]  Between 7 am - 7 pm I am available, please contact me via Amion or Securechat  Between 7 pm - 7 am I am not available, please contact night coverage MD/APP via Amion

## 2019-10-02 NOTE — Progress Notes (Signed)
   Vital Signs MEWS/VS Documentation      10/01/2019 2044 10/01/2019 2240 10/01/2019 2253 10/02/2019 0016   MEWS Score:  3  2  2  1    MEWS Score Color:  Yellow  Yellow  Yellow  Green   Resp:  --  18  17  17    Pulse:  --  (!) 119  (!) 119  (!) 108   BP:  --  (!) 129/108  (!) 134/105  (!) 134/104   Temp:  --  99.2 F (37.3 C)  --  98.2 F (36.8 C)   O2 Device:  --  Room   Level of Consciousness:  Alert  --  --  --     Patients HR and BP elevated and complaining of 10/10 pain. MD notified of change in BP. Lactic acid, bolus and PRN ativan ordered.   0052: HR trended down to 108 and BP still elevated. MD notified of update. No new interventions at the moment. Will continue to monitor.       Rajeev Escue 10/02/2019,12:50 AM

## 2019-10-02 NOTE — Plan of Care (Signed)

## 2019-10-03 LAB — CBC
HCT: 36.6 % (ref 36.0–46.0)
Hemoglobin: 12.1 g/dL (ref 12.0–15.0)
MCH: 28.1 pg (ref 26.0–34.0)
MCHC: 33.1 g/dL (ref 30.0–36.0)
MCV: 84.9 fL (ref 80.0–100.0)
Platelets: 446 10*3/uL — ABNORMAL HIGH (ref 150–400)
RBC: 4.31 MIL/uL (ref 3.87–5.11)
RDW: 15.6 % — ABNORMAL HIGH (ref 11.5–15.5)
WBC: 9.8 10*3/uL (ref 4.0–10.5)
nRBC: 0 % (ref 0.0–0.2)

## 2019-10-03 LAB — BASIC METABOLIC PANEL
Anion gap: 11 (ref 5–15)
BUN: 5 mg/dL — ABNORMAL LOW (ref 6–20)
CO2: 24 mmol/L (ref 22–32)
Calcium: 8.6 mg/dL — ABNORMAL LOW (ref 8.9–10.3)
Chloride: 98 mmol/L (ref 98–111)
Creatinine, Ser: 0.52 mg/dL (ref 0.44–1.00)
GFR calc Af Amer: >60 mL/min (ref >60)
GFR calc non Af Amer: >60 mL/min (ref >60)
Glucose, Bld: 156 mg/dL — ABNORMAL HIGH (ref 70–99)
Potassium: 3.3 mmol/L — ABNORMAL LOW (ref 3.5–5.1)
Sodium: 133 mmol/L — ABNORMAL LOW (ref 135–145)

## 2019-10-03 LAB — VANCOMYCIN, PEAK: Vancomycin Pk: 21 ug/mL — ABNORMAL LOW (ref 30–40)

## 2019-10-03 MED ORDER — HYDROMORPHONE HCL 1 MG/ML IJ SOLN
1.0000 mg | INTRAMUSCULAR | Status: DC | PRN
  Administered 2019-10-03 (×2): 2 mg via INTRAVENOUS
  Administered 2019-10-04 (×2): 1 mg via INTRAVENOUS
  Administered 2019-10-04: 2 mg via INTRAVENOUS
  Administered 2019-10-04: 1 mg via INTRAVENOUS
  Filled 2019-10-03: qty 2
  Filled 2019-10-03 (×2): qty 1
  Filled 2019-10-03 (×2): qty 2
  Filled 2019-10-03: qty 1

## 2019-10-03 MED ORDER — POTASSIUM CHLORIDE CRYS ER 20 MEQ PO TBCR
40.0000 meq | EXTENDED_RELEASE_TABLET | Freq: Once | ORAL | Status: AC
  Administered 2019-10-03: 12:00:00 40 meq via ORAL
  Filled 2019-10-03: qty 2

## 2019-10-03 MED ORDER — LORAZEPAM 2 MG/ML IJ SOLN
0.5000 mg | Freq: Two times a day (BID) | INTRAMUSCULAR | Status: DC
  Administered 2019-10-03 – 2019-10-04 (×3): 0.5 mg via INTRAVENOUS
  Filled 2019-10-03 (×3): qty 1

## 2019-10-03 NOTE — Progress Notes (Signed)
Notification/update given per Diannia Ruder, RN on MEWS score for this patient.  The patient is alert, no distress noted when checked.

## 2019-10-03 NOTE — Progress Notes (Addendum)
Dr Benjamine Mola was sent a secure epic message regarding the Primary Care nurse's concern regarding increased heart rate.  Dr Benjamine Mola immediately returned communication to [place], the Primary Nurse at this time.  Awaiting for new orders if needed at this time

## 2019-10-03 NOTE — Progress Notes (Addendum)
PROGRESS NOTE  Colleen Ramirez WFU:[id number] DOB: 1981-09-05 DOA: 09/29/2019 PCP: Patient, No Pcp Per   LOS: 4 days   Brief Narrative / Interim history: Colleen Ramirez is an 39 y.o. female IV drug use (methamphetamine), anemia, anxiety, depression,  s/p gastric bypass, and homelessness, who initially presented with a 2-day history of severe sharp neck pain yesterday. Patient gives a limited history.  Pain is constant, but worse with any kind of movement.   Admits to last using "Ice" for crystal meth approximately 2 days ago.  Patient on injects into her arms.  Just recently hospitalized from12/1/20-12/8/20 with facial cellulitis.  She was doxycycline 100 mg twice daily  to complete a 10-day course and clindamycin external solution, but it appears not to have completed this.  Will take over care from NS but they will stay on with daily notes.     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 oxycodone as well as as needed IV  Dysphagia -decadron x 1 per NS  Polysubstance abuse -mostly meth per patient -TOC consult -not a candidate for PICC line due to IV drug abuse -Patient appears to be withdrawing so will schedule Ativan for a few days  HCV positive -per ID  TOC has been consulted given her homeless status, drug abuse and need for 6 weeks of abx   Scheduled Meds: . [START ON 07/30/2020] docusate sodium  100 mg Oral BID  . feeding supplement (ENSURE ENLIVE)  237 mL Oral BID BM  . heparin injection  (subcutaneous)  5,000 Units Subcutaneous Q8H  . lidocaine  1 patch Transdermal Daily  . LORazepam  0.5 mg Intravenous Q12H  . LORazepam  0.5 mg Oral Once  . multivitamin with minerals  1 tablet Oral Daily  . mupirocin ointment   Nasal BID  . nicotine  21 mg Transdermal Daily  . rifampin  300 mg Oral Q12H  . sodium chloride flush  3 mL Intravenous Q12H   Continuous Infusions: . sodium chloride 250 mL (09/30/19 0126)  . vancomycin 1,250 mg (10/02/19 1851)   PRN Meds:.acetaminophen **OR** acetaminophen, cyclobenzaprine, HYDROmorphone (DILAUDID) injection, menthol-cetylpyridinium **OR** phenol, ondansetron **OR** ondansetron (ZOFRAN) IV, oxyCODONE, polyethylene glycol, sodium chloride flush  DVT prophylaxis: SCDs as she is refusing heparin injections Code Status: Full code Anticipated d/c place: TBD Barriers to d/c: prolonged IV antibiotics and inability to discharge PICC line due to drug abuse  Procedures:  CERVICAL FIVE TO CERVICALSIX ANTERIOR DISCECTOMY AND INSTRUMENTED FUSION (N/A) by Dr. Maurice Small 1/12  2d echo IMPRESSIONS  1. Left ventricular ejection fraction, by visual estimation, is 65 to 70%. The left ventricle has hyperdynamic function. There is no left ventricular hypertrophy.  2. Left ventricular diastolic parameters are consistent with Grade I diastolic dysfunction (impaired relaxation).  3. The left ventricle has no regional wall motion abnormalities.  4. Global right ventricle has normal systolic function.The right ventricular size is normal. No increase in right ventricular wall thickness.  5. Left atrial size was normal.  6. Right atrial size was  normal.  7. The mitral valve is normal in structure. No evidence of mitral valve regurgitation. No evidence of mitral stenosis.  8. The aortic valve is tricuspid. Aortic valve regurgitation is not visualized. No evidence of aortic valve sclerosis or stenosis.  9. TR signal is inadequate for assessing pulmonary artery systolic  pressure. 10. The inferior vena cava is normal in size with greater than 50% respiratory variability, suggesting right atrial pressure of 3 mmHg. 11. The tricuspid valve is normal in structure.       Objective: Vitals:   10/03/19 1213 10/03/19 1214 10/03/19 1413 10/03/19 1414  BP: (!) 126/96 (!) 115/93 (!) 127/93 (!) 123/93  Pulse: (!) 128 (!) 128 (!) 143 (!) 144  Resp: 18 18 15 15   Temp: 99.1 F (37.3 C) 99.4 F (37.4 C) 100.3 F (37.9 C) 100.2 F (37.9 C)  TempSrc: Oral Oral Oral Oral  SpO2: 100% 100% 99% 97%  Weight:      Height:        Intake/Output Summary (Last 24 hours) at 10/03/2019 1422 Last data filed at 10/03/2019 0818 Gross per 24 hour  Intake 1210 ml  Output 600 ml  Net 610 ml   Filed Weights   09/29/19 2032  Weight: 49.9 kg    Examination:  In bed, talking on the phone   Data Reviewed: I have independently reviewed following labs and imaging studies   CBC: Recent Labs  Lab 09/29/19 1214 10/01/19 0445 10/02/19 0342 10/03/19 0313  WBC 13.8* 14.9* 10.4 9.8  NEUTROABS 11.7* 11.5*  --   --   HGB 12.5 11.1* 11.3* 12.1  HCT 38.8 34.2* 35.1* 36.6  MCV 86.6 86.4 86.0 84.9  PLT 316 308 401* 446*   Basic Metabolic Panel: Recent Labs  Lab 09/29/19 1214 10/01/19 0445 10/02/19 0342 10/03/19 0313  NA 134* 135 137 133*  K 3.8 4.1 3.6 3.3*  CL 99 101 101 98  CO2 25 23 27 24   GLUCOSE 111* 98 102* 156*  BUN 10 7 5* 5*  CREATININE 0.49 0.46 0.43* 0.52  CALCIUM 8.8* 8.3* 8.7* 8.6*   Liver Function Tests: No results for input(s): AST, ALT, ALKPHOS, BILITOT, PROT, ALBUMIN in the last 168 hours. Coagulation Profile: No results for input(s): INR, PROTIME in the last 168 hours. HbA1C: No results for input(s): HGBA1C in the last 72 hours. CBG: No results for input(s): GLUCAP in the last 168 hours.  Recent Results (from the past 240 hour(s))  SARS CORONAVIRUS 2 (TAT 6-24 HRS) Nasopharyngeal Nasopharyngeal Swab     Status: None   Collection Time:  09/29/19 11:38 AM   Specimen: Nasopharyngeal Swab  Result Value Ref Range Status   SARS Coronavirus 2 NEGATIVE NEGATIVE Final    Comment: (NOTE) SARS-CoV-2 target nucleic acids are NOT DETECTED. The SARS-CoV-2 RNA is generally detectable in upper and lower respiratory specimens during the acute phase of infection. Negative results do not preclude SARS-CoV-2 infection, do not rule out co-infections with other pathogens, and should not be used as the sole basis for treatment or other patient management decisions. Negative results must be combined with clinical observations, patient history, and epidemiological information. The expected result is Negative. Fact Sheet for Patients: Fact Sheet for Healthcare Providers: 11/27/19 This test is not yet approved or cleared by the [employer] FDA and  has been authorized for detection and/or diagnosis of SARS-CoV-2 by FDA under an Emergency Use Authorization (EUA). This EUA will remain  in effect (meaning this test can be  used) for the duration of the COVID-19 declaration under Section 56 4(b)(1) of the Act, 21 U.S.C. section 360bbb-3(b)(1), unless the authorization is terminated or revoked sooner. Performed at [facility], [place] [address] [place], [place] [address]   Aerobic/Anaerobic Culture (surgical/deep wound)     Status: None (Preliminary result)   Collection Time: 09/29/19  9:43 PM   Specimen: Abscess  Result Value Ref Range Status   Specimen Description ABSCESS  Final   Special Requests CERVICAL EPIDURAL  Final   Gram Stain   Final    RARE WBC PRESENT, PREDOMINANTLY PMN FEW GRAM POSITIVE COCCI IN CLUSTERS Performed at [facility], [place] [address], [place], [place] [address]    Culture   Final    ABUNDANT METHICILLIN RESISTANT STAPHYLOCOCCUS AUREUS NO ANAEROBES ISOLATED; CULTURE IN PROGRESS FOR 5 DAYS    Report Status PENDING  Incomplete     Organism ID, Bacteria METHICILLIN RESISTANT STAPHYLOCOCCUS AUREUS  Final      Susceptibility   Methicillin resistant staphylococcus aureus - MIC*    CIPROFLOXACIN >=8 RESISTANT Resistant     ERYTHROMYCIN >=8 RESISTANT Resistant     GENTAMICIN <=0.5 SENSITIVE Sensitive     OXACILLIN >=4 RESISTANT Resistant     TETRACYCLINE <=1 SENSITIVE Sensitive     VANCOMYCIN <=0.5 SENSITIVE Sensitive     TRIMETH/SULFA 80 RESISTANT Resistant     CLINDAMYCIN >=8 RESISTANT Resistant     RIFAMPIN <=0.5 SENSITIVE Sensitive     Inducible Clindamycin NEGATIVE Sensitive     * ABUNDANT METHICILLIN RESISTANT STAPHYLOCOCCUS AUREUS  MRSA PCR Screening     Status: Abnormal   Collection Time: 10/01/19  6:23 PM   Specimen: Nasal Mucosa; Nasopharyngeal  Result Value Ref Range Status   MRSA by PCR POSITIVE (A) NEGATIVE Final    Comment:        The GeneXpert MRSA Assay (FDA approved for NASAL specimens only), is one component of a comprehensive MRSA colonization surveillance program. It is not intended to diagnose MRSA infection nor to guide or monitor treatment for MRSA infections. RESULT CALLED TO, READ BACK BY AND VERIFIED WITH: A NEZIUS RN 10/01/19 2000 JDW Performed at [facility], [place] [address], [place], [place] [address]      Agatha Duplechain DO [facility]  Between 7 am - 7 pm I am available, please contact me via Amion or Securechat  Between 7 pm - 7 am I am not available, please contact night coverage MD/APP via Amion

## 2019-10-03 NOTE — Plan of Care (Signed)
  Problem: Education: Goal: Knowledge of General Education information will improve Description: Including pain rating scale, medication(s)/side effects and non-pharmacologic comfort measures Outcome: Progressing   Problem: Nutrition: Goal: Adequate nutrition will be maintained Outcome: Progressing   Problem: Safety: Goal: Ability to remain free from injury will improve Outcome: Progressing   

## 2019-10-03 NOTE — Plan of Care (Signed)

## 2019-10-03 NOTE — Progress Notes (Signed)
14:17 - Paged Dr. Benjamine Mola 14:19 - Notified charge nurse, Aram Beecham RN   Vital Signs MEWS/VS Documentation      10/03/2019 1213 10/03/2019 1214 10/03/2019 1413 10/03/2019 1414   MEWS Score:  2  2  3  3    MEWS Score Color:  Yellow  Yellow  Yellow  Yellow   Resp:  18  18  15  15    Pulse:  (!) 128  (!) 128  (!) 143  (!) 144   BP:  (!) 126/96  (!) 115/93  (!) 127/93  (!) 123/93   Temp:  99.1 F (37.3 C)  99.4 F (37.4 C)  100.3 F (37.9 C)  100.2 F (37.9 C)   O2 Device:  Room  Room Air           [place] 10/03/2019,2:22 PM

## 2019-10-03 NOTE — Progress Notes (Signed)
   Providing Compassionate, Quality Care - Together   Subjective: Patient reports pain in her shoulders and arms and difficulty swallowing.  Objective: Vital signs in last 24 hours: Temp:  [98.6 F (37 C)-99.8 F (37.7 C)] 99.5 F (37.5 C) (01/16 0757) Pulse Rate:  [102-124] 124 (01/16 0757) Resp:  [16-18] 16 (01/16 0757) BP: (119-133)/(84-107) 119/94 (01/16 0757) SpO2:  [94 %-100 %] 100 % (01/16 0757)  Intake/Output from previous day: 01/15 0701 - 01/16 0700 In: 1333 [P.O.:1080; I.V.:3; IV Piggyback:250] Out: 600 [Urine:600] Intake/Output this shift: No intake/output data recorded.  Alert and oriented x 4 PERRLA CN II-XII grossly intact MAE, Strength and sensation intact Incision with Dermabond is clean, dry, and intact   Lab Results: Recent Labs    10/02/19 0342 10/03/19 0313  WBC 10.4 9.8  HGB 11.3* 12.1  HCT 35.1* 36.6  PLT 401* 446*   BMET Recent Labs    10/02/19 0342 10/03/19 0313  NA 137 133*  K 3.6 3.3*  CL 101 98  CO2 27 24  GLUCOSE 102* 156*  BUN 5* 5*  CREATININE 0.43* 0.52  CALCIUM 8.7* 8.6*    Studies/Results: No results found.  Assessment/Plan: Patient with history of polysubstance abuse underwent a C5-6 ACDF for abscess evacuation by Dr. Maurice Small on 09/29/2019. Gram stain with gram positive cocci in clusters (MRSA). ID treating with IV vancomycin and oral rifampin. Echocardiogram showed no vegetation.   LOS: 4 days    -Continue antibiotic therapy per ID -Mobilize with therapies; Patient refused yesterday due to pain; PT/OT recommending SNF at discharge -Pain management   Val Eagle, DNP, AGNP-C Nurse Practitioner  [facility] [address], [place], [place] [address] P: [phone]    F: [phone]  10/03/2019, 8:36 AM

## 2019-10-03 NOTE — Progress Notes (Addendum)
Dr. Benjamine Mola paged at 08:04; awaiting return call.  Charge nurse notified that patient's MEWS is a 2 d/t heart rate 120s and BP 119/94; overnight her heart rate had been in the 120s, as well as the day before; this is not an acute change.  Vital Signs MEWS/VS Documentation      10/03/2019 0036 10/03/2019 0228 10/03/2019 0756 10/03/2019 0757   MEWS Score:  1  1  2  2    MEWS Score Color:  Green  Green  Yellow  Yellow   Resp:  --  --  16  16   Pulse:  (!) 107  (!) 108  (!) 122  (!) 124   BP:  --  (!) 130/101  (!) 124/97  (!) 119/94   Temp:  --  99.2 F (37.3 C)  99.5 F (37.5 C)  99.5 F (37.5 C)   O2 Device:  Room           [place] 10/03/2019,8:06 AM

## 2019-10-03 NOTE — Progress Notes (Signed)
Pharmacy Antibiotic Note  Colleen Ramirez is a 39 y.o. female admitted on 09/29/2019 with MRSA cervical spine infection.  Pharmacy has been consulted for Vancomycin dosing.  The patient continues on Vancomycin - will plan to check levels on 1/16 evening and 1/17 to evaluate dose appropriateness. Renal function has been stable.   Plan: - Continue Vancomycin 1250 mg IV every 24 hours - Will obtain levels 1/16 and 1/17 for dose evaluation - ID planning 6 weeks of treatment  Height: 5\' 3"  (160 cm) Weight: 110 lb (49.9 kg) IBW/kg (Calculated) : 52.4  Temp (24hrs), Avg:99.2 F (37.3 C), Min:98.6 F (37 C), Max:99.8 F (37.7 C)  Recent Labs  Lab 09/29/19 1214 10/01/19 0445 10/01/19 2322 10/02/19 0342 10/03/19 0313  WBC 13.8* 14.9*  --  10.4 9.8  CREATININE 0.49 0.46  --  0.43* 0.52  LATICACIDVEN  --   --  1.6  --   --     Estimated Creatinine Clearance: 74.4 mL/min (by C-G formula based on SCr of 0.52 mg/dL).    No Known Allergies  Antimicrobials this admission: Zosyn 1/12 >> 1/13 Vanc 1/12 >> Rifampin 1/14 >>  Dose adjustments this admission: n/a  Microbiology results: 1/12 abscess cx - GPCs in clusters - MRSA  Thank you for allowing pharmacy to be a part of this patient's care.  3/12, PharmD, BCPS Clinical Pharmacist Clinical phone for 10/03/2019: 10/05/2019 10/03/2019 10:50 AM   **Pharmacist phone directory can now be found on [email or web address] ([email or web address]).  Listed under [facility].

## 2019-10-03 NOTE — Progress Notes (Signed)
Not an acute change. Will continue with vitals q2h.   Vital Signs MEWS/VS Documentation      10/03/2019 1413 10/03/2019 1414 10/03/2019 1454 10/03/2019 1633   MEWS Score:  3  3  3  2    MEWS Score Color:  Yellow  Yellow  Yellow  Yellow   Resp:  15  15  --  15   Pulse:  (!) 143  (!) 144  --  (!) 129   BP:  (!) 127/93  (!) 123/93  --  (!) 120/94   Temp:  100.3 F (37.9 C)  100.2 F (37.9 C)  --  98.2 F (36.8 C)   O2 Device:  Room Air  Room Air  --  Room Air   Level of Consciousness:  --  --  --  Alert           10/03/2019,4:39 PM

## 2019-10-04 DIAGNOSIS — A4901 Methicillin susceptible Staphylococcus aureus infection, unspecified site: Secondary | ICD-10-CM

## 2019-10-04 DIAGNOSIS — Z765 Malingerer [conscious simulation]: Secondary | ICD-10-CM

## 2019-10-04 LAB — CREATININE, SERUM
Creatinine, Ser: 0.48 mg/dL (ref 0.44–1.00)
GFR calc Af Amer: >60 mL/min (ref >60)
GFR calc non Af Amer: >60 mL/min (ref >60)

## 2019-10-04 LAB — VANCOMYCIN, TROUGH: Vancomycin Tr: <4 ug/mL — ABNORMAL LOW (ref 15–20)

## 2019-10-04 MED ORDER — VANCOMYCIN HCL IN DEXTROSE 1-5 GM/200ML-% IV SOLN
1000.0000 mg | INTRAVENOUS | Status: AC
  Administered 2019-10-04: 1000 mg via INTRAVENOUS
  Filled 2019-10-04: qty 200

## 2019-10-04 MED ORDER — VANCOMYCIN HCL IN DEXTROSE 750-5 MG/150ML-% IV SOLN
750.0000 mg | Freq: Three times a day (TID) | INTRAVENOUS | Status: DC
  Administered 2019-10-04 – 2019-10-09 (×14): 750 mg via INTRAVENOUS
  Filled 2019-10-04 (×15): qty 150

## 2019-10-04 MED ORDER — HYDROMORPHONE HCL 1 MG/ML IJ SOLN
1.0000 mg | INTRAMUSCULAR | Status: DC | PRN
  Administered 2019-10-04 – 2019-10-12 (×41): 1 mg via INTRAVENOUS
  Filled 2019-10-04 (×42): qty 1

## 2019-10-04 MED ORDER — METOPROLOL TARTRATE 5 MG/5ML IV SOLN
5.0000 mg | Freq: Once | INTRAVENOUS | Status: AC
  Administered 2019-10-04: 5 mg via INTRAVENOUS
  Filled 2019-10-04: qty 5

## 2019-10-04 NOTE — Significant Event (Signed)
Rapid Response Event Note  I received a call from the nurse [phone] but I was unable to answer the call, I was in isolation with another patient. I called the nurse back at 1827 and nurse informed me that she wanted to notify me of MEWS score of 3 (was a 2 earlier) - for HR 136 and BP 115/91. Per nurse, the tachycardia is not new, however is HR is little higher this afternoon. Nurse feels that the patient's ongoing pain seems to drive the tachycardia. Pain medications were adjusted earlier, nurse just administered some PRN pain medication. The nurse did not need RR to assess the patient at this time. I reviewed the chart and called the nurse back, we talked about the patient's presentation, I asked if the patient was exhibiting any signs of withdrawal and per nurse, patient was not.   Plan: - Treat pain as patient tolerates. - Monitors VS and check/recheck per orders and MEWS protocol - Monitor for signs for worsening infection - Encourage OOB and ambulation.   - Encourage IS.    Charlestine Rookstool R

## 2019-10-04 NOTE — Progress Notes (Signed)
   Providing Compassionate, Quality Care - Together   Subjective: Patient refuses to answer questions this morning.  Objective: Vital signs in last 24 hours: Temp:  [97.5 F (36.4 C)-100.3 F (37.9 C)] 97.8 F (36.6 C) (01/17 0607) Pulse Rate:  [105-144] 106 (01/17 0607) Resp:  [14-18] 14 (01/17 0001) BP: (115-127)/(93-100) 121/93 (01/17 0607) SpO2:  [97 %-100 %] 99 % (01/17 0607)  Intake/Output from previous day: 01/16 0701 - 01/17 0700 In: 840 [P.O.:840] Out: -  Intake/Output this shift: No intake/output data recorded.  Alert PERRLA MAE, Strength and sensation grossly intact Incision with Dermabond is clean, dry, and intact  Lab Results: Recent Labs    10/02/19 0342 10/03/19 0313  WBC 10.4 9.8  HGB 11.3* 12.1  HCT 35.1* 36.6  PLT 401* 446*   BMET Recent Labs    10/02/19 0342 10/03/19 0313  NA 137 133*  K 3.6 3.3*  CL 101 98  CO2 27 24  GLUCOSE 102* 156*  BUN 5* 5*  CREATININE 0.43* 0.52  CALCIUM 8.7* 8.6*    Studies/Results: No results found.  Assessment/Plan: Patient with history of polysubstance abuse underwent a C5-6 ACDF for abscess evacuation by Dr. Maurice Small on 09/29/2019. Gram stain with gram positive cocci in clusters (MRSA). ID treating with IV vancomycin and oral rifampin. Echocardiogram showed no vegetation.   LOS: 5 days    -Continue antibiotic therapy per ID -Mobilize with therapies; PT/OT recommending SNF at discharge -Pain management   Val Eagle, DNP, AGNP-C Nurse Practitioner  [facility] [address], [address], [place], [place] [address] P: [phone]    F: [phone]  10/04/2019, 9:45 AM

## 2019-10-04 NOTE — Progress Notes (Signed)
Pharmacy Antibiotic Note  Colleen Ramirez is a 39 y.o. female admitted on 09/29/2019 with MRSA cervical spine infection.  Pharmacy has been consulted for Vancomycin dosing.  The patient had two Vancomycin levels drawn - a peak 21 mcg/ml (true VP 26.6 mcg/ml) and a random which was before the trough and already undetectable at <4 mcg/ml. Will increase the dose and adjust to q8h dosing to help facilitate a quicker steady state and level checks.   Plan: - Vancomycin 1g IV x 1 followed by 750 mg IV every 8 hours - Will plan to repeat levels at steady state after 1/18 PM dose - ID planning 6 weeks of treatment  Height: 5\' 3"  (160 cm) Weight: 110 lb (49.9 kg) IBW/kg (Calculated) : 52.4  Temp (24hrs), Avg:98.9 F (37.2 C), Min:97.5 F (36.4 C), Max:100.3 F (37.9 C)  Recent Labs  Lab 09/29/19 1214 10/01/19 0445 10/01/19 2322 10/02/19 0342 10/03/19 0313 10/03/19 2053  WBC 13.8* 14.9*  --  10.4 9.8  --   CREATININE 0.49 0.46  --  0.43* 0.52  --   LATICACIDVEN  --   --  1.6  --   --   --   VANCOPEAK  --   --   --   --   --  21*    Estimated Creatinine Clearance: 74.4 mL/min (by C-G formula based on SCr of 0.52 mg/dL).    No Known Allergies  Antimicrobials this admission: Zosyn 1/12 >> 1/13 Vanc 1/12 >> Rifampin 1/14 >>  Dose adjustments this admission: 1/16 & 1/17 levels: 21 and <4 - 1g x 1 and adj to 750 mg/8h  Microbiology results: 1/12 abscess cx - GPCs in clusters - MRSA  Thank you for allowing pharmacy to be a part of this patient's care.  3/12, PharmD, BCPS Clinical Pharmacist Clinical phone for 10/04/2019: 10/06/2019 10/04/2019 8:56 AM   **Pharmacist phone directory can now be found on [email or web address] ([email or web address]).  Listed under [facility].

## 2019-10-04 NOTE — Progress Notes (Signed)
PROGRESS NOTE  Colleen Ramirez KKX:[id number] DOB: 1981/08/21 DOA: 09/29/2019 PCP: Patient, No Pcp Per   LOS: 5 days   Brief Narrative / Interim history: Colleen Ramirez is an 39 y.o. female IV drug use (methamphetamine), anemia, anxiety, depression,  s/p gastric bypass, and homelessness, who was admitted 09/29/2019 with cervical epidural abscess.  And underwent ACDF 09/29/2019 and started on vancomycin.  Cultures were positive for MRSA and rifampin was added to her regimen.  Echocardiogram without vegetation.  Of note patient is known to inject meth and has had multiple episodes of cellulitis in the past.  Subjective / 24h Interval events:  Patient states that she has not been able to sleep and is in pain.  However patient falls asleep mid sentence.  Assessment & Plan:  Cervical epidural abscess C5-6 ACDF on 09/29/2019 Intraoperative cultures growing MRSA Patient on vancomycin and rifampin day #5  Will likely need 6 weeks of IV antibiotics although echocardiogram is without any vegetations. ID following  Pain management Patient is quite oversedated. She is known with polysubstance use/IVDU so certainly will use narcotics if provided. We will decrease Dilaudid to 1 mg every 4 hours as needed Continue oxycodone 15 every 4 without change.  Dysphagia Status post decadron x 1 per NS  Polysubstance abuse Drug of choice is apparently with amphetamines which she uses IV Not a candidate for a PICC line due to ongoing IV drug abuse. TOC has been consulted given her homeless status, drug abuse and need for 6 weeks of abx  HCV positive Ongoing treatment as per ID as an outpatient    Scheduled Meds: . [START ON 07/30/2020] docusate sodium  100 mg Oral BID  . feeding supplement (ENSURE ENLIVE)  237 mL Oral BID BM  . heparin injection (subcutaneous)  5,000 Units Subcutaneous Q8H  . lidocaine  1 patch Transdermal Daily  . LORazepam  0.5 mg Intravenous Q12H  . LORazepam  0.5 mg Oral Once    . multivitamin with minerals  1 tablet Oral Daily  . mupirocin ointment   Nasal BID  . nicotine  21 mg Transdermal Daily  . rifampin  300 mg Oral Q12H  . sodium chloride flush  3 mL Intravenous Q12H   Continuous Infusions: . sodium chloride 250 mL (09/30/19 0126)  . vancomycin 1,000 mg (10/04/19 1504)  . vancomycin     PRN Meds:.acetaminophen **OR** acetaminophen, cyclobenzaprine, HYDROmorphone (DILAUDID) injection, menthol-cetylpyridinium **OR** phenol, ondansetron **OR** ondansetron (ZOFRAN) IV, oxyCODONE, polyethylene glycol, sodium chloride flush  DVT prophylaxis: SCDs as she is refusing heparin injections Code Status: Full code Anticipated d/c place: TBD Barriers to d/c: prolonged IV antibiotics and inability to discharge PICC line due to drug abuse  Procedures:  CERVICAL FIVE TO CERVICALSIX ANTERIOR DISCECTOMY AND INSTRUMENTED FUSION (N/A) by Dr. Zada Finders 1/12  2d echo IMPRESSIONS  1. Left ventricular ejection fraction, by visual estimation, is 65 to 70%. The left ventricle has hyperdynamic function. There is no left ventricular hypertrophy.  2. Left ventricular diastolic parameters are consistent with Grade I diastolic dysfunction (impaired relaxation).  3. The left ventricle has no regional wall motion abnormalities.  4. Global right ventricle has normal systolic function.The right ventricular size is normal. No increase in right ventricular wall thickness.  5. Left atrial size was normal.  6. Right atrial size was normal.  7. The mitral valve is normal in structure. No evidence of mitral valve regurgitation. No evidence of mitral stenosis.  8. The aortic valve is tricuspid. Aortic valve regurgitation is not visualized.  No evidence of aortic valve sclerosis or stenosis.  9. TR signal is inadequate for assessing pulmonary artery systolic pressure. 10. The inferior vena cava is normal in size with greater than 50% respiratory variability, suggesting right atrial pressure of 3  mmHg. 11. The tricuspid valve is normal in structure.       Objective: Vitals:   10/04/19 0001 10/04/19 0206 10/04/19 0607 10/04/19 1252  BP: (!) 122/100 (!) 126/96 (!) 121/93 (!) 127/100  Pulse: (!) 125 (!) 105 (!) 106 (!) 121  Resp: 14   16  Temp: 98.7 F (37.1 C) 98.6 F (37 C) 97.8 F (36.6 C) 98.5 F (36.9 C)  TempSrc: Oral Oral Oral Oral  SpO2: 100% 99% 99% 100%  Weight:      Height:        Intake/Output Summary (Last 24 hours) at 10/04/2019 1559 Last data filed at 10/03/2019 1700 Gross per 24 hour  Intake 120 ml  Output --  Net 120 ml   Filed Weights   09/29/19 2032  Weight: 49.9 kg    Examination:  In bed, talking on the phone   Data Reviewed: I have independently reviewed following labs and imaging studies   CBC: Recent Labs  Lab 09/29/19 1214 10/01/19 0445 10/02/19 0342 10/03/19 0313  WBC 13.8* 14.9* 10.4 9.8  NEUTROABS 11.7* 11.5*  --   --   HGB 12.5 11.1* 11.3* 12.1  HCT 38.8 34.2* 35.1* 36.6  MCV 86.6 86.4 86.0 84.9  PLT 316 308 401* 446*   Basic Metabolic Panel: Recent Labs  Lab 09/29/19 1214 10/01/19 0445 10/02/19 0342 10/03/19 0313 10/04/19 1206  NA 134* 135 137 133*  --   K 3.8 4.1 3.6 3.3*  --   CL 99 101 101 98  --   CO2 25 23 27 24   --   GLUCOSE 111* 98 102* 156*  --   BUN 10 7 5* 5*  --   CREATININE 0.49 0.46 0.43* 0.52 0.48  CALCIUM 8.8* 8.3* 8.7* 8.6*  --    Liver Function Tests: No results for input(s): AST, ALT, ALKPHOS, BILITOT, PROT, ALBUMIN in the last 168 hours. Coagulation Profile: No results for input(s): INR, PROTIME in the last 168 hours. HbA1C: No results for input(s): HGBA1C in the last 72 hours. CBG: No results for input(s): GLUCAP in the last 168 hours.  Recent Results (from the past 240 hour(s))  SARS CORONAVIRUS 2 (TAT 6-24 HRS) Nasopharyngeal Nasopharyngeal Swab     Status: None   Collection Time: 09/29/19 11:38 AM   Specimen: Nasopharyngeal Swab  Result Value Ref Range Status   SARS  Coronavirus 2 NEGATIVE NEGATIVE Final    Comment: (NOTE) SARS-CoV-2 target nucleic acids are NOT DETECTED. The SARS-CoV-2 RNA is generally detectable in upper and lower respiratory specimens during the acute phase of infection. Negative results do not preclude SARS-CoV-2 infection, do not rule out co-infections with other pathogens, and should not be used as the sole basis for treatment or other patient management decisions. Negative results must be combined with clinical observations, patient history, and epidemiological information. The expected result is Negative. Fact Sheet for Patients: 11/27/19 Fact Sheet for Healthcare Providers: HairSlick.no This test is not yet approved or cleared by the quierodirigir.com FDA and  has been authorized for detection and/or diagnosis of SARS-CoV-2 by FDA under an Emergency Use Authorization (EUA). This EUA will remain  in effect (meaning this test can be used) for the duration of the COVID-19 declaration under Section 56  4(b)(1) of the Act, 21 U.S.C. section 360bbb-3(b)(1), unless the authorization is terminated or revoked sooner. Performed at [facility], [address] [place], [place] [address]   Aerobic/Anaerobic Culture (surgical/deep wound)     Status: None (Preliminary result)   Collection Time: 09/29/19  9:43 PM   Specimen: Abscess  Result Value Ref Range Status   Specimen Description ABSCESS  Final   Special Requests CERVICAL EPIDURAL  Final   Gram Stain   Final    RARE WBC PRESENT, PREDOMINANTLY PMN FEW GRAM POSITIVE COCCI IN CLUSTERS Performed at [facility], [address], [place], [place] [address]    Culture   Final    ABUNDANT METHICILLIN RESISTANT STAPHYLOCOCCUS AUREUS NO ANAEROBES ISOLATED; CULTURE IN PROGRESS FOR 5 DAYS    Report Status PENDING  Incomplete   Organism ID, Bacteria METHICILLIN RESISTANT STAPHYLOCOCCUS AUREUS  Final       Susceptibility   Methicillin resistant staphylococcus aureus - MIC*    CIPROFLOXACIN >=8 RESISTANT Resistant     ERYTHROMYCIN >=8 RESISTANT Resistant     GENTAMICIN <=0.5 SENSITIVE Sensitive     OXACILLIN >=4 RESISTANT Resistant     TETRACYCLINE <=1 SENSITIVE Sensitive     VANCOMYCIN <=0.5 SENSITIVE Sensitive     TRIMETH/SULFA 80 RESISTANT Resistant     CLINDAMYCIN >=8 RESISTANT Resistant     RIFAMPIN <=0.5 SENSITIVE Sensitive     Inducible Clindamycin NEGATIVE Sensitive     * ABUNDANT METHICILLIN RESISTANT STAPHYLOCOCCUS AUREUS  MRSA PCR Screening     Status: Abnormal   Collection Time: 10/01/19  6:23 PM   Specimen: Nasal Mucosa; Nasopharyngeal  Result Value Ref Range Status   MRSA by PCR POSITIVE (A) NEGATIVE Final    Comment:        The GeneXpert MRSA Assay (FDA approved for NASAL specimens only), is one component of a comprehensive MRSA colonization surveillance program. It is not intended to diagnose MRSA infection nor to guide or monitor treatment for MRSA infections. RESULT CALLED TO, READ BACK BY AND VERIFIED WITH: A NEZIUS RN 10/01/19 2000 JDW Performed at [facility], [address] [place], [place] [address]      Marlin Canary DO [facility]  Between 7 am - 7 pm I am available, please contact me via Amion or Securechat  Between 7 pm - 7 am I am not available, please contact night coverage MD/APP via Amion

## 2019-10-04 NOTE — Plan of Care (Signed)

## 2019-10-04 NOTE — Progress Notes (Signed)
1930: pt refused VS for [facility], will try again later

## 2019-10-04 NOTE — Progress Notes (Signed)
Notified Bodenheimer about yellow MEWS.

## 2019-10-05 DIAGNOSIS — M25519 Pain in unspecified shoulder: Secondary | ICD-10-CM

## 2019-10-05 DIAGNOSIS — F331 Major depressive disorder, recurrent, moderate: Secondary | ICD-10-CM

## 2019-10-05 DIAGNOSIS — F1990 Other psychoactive substance use, unspecified, uncomplicated: Secondary | ICD-10-CM

## 2019-10-05 LAB — BASIC METABOLIC PANEL
Anion gap: 10 (ref 5–15)
BUN: 8 mg/dL (ref 6–20)
CO2: 25 mmol/L (ref 22–32)
Calcium: 9.3 mg/dL (ref 8.9–10.3)
Chloride: 100 mmol/L (ref 98–111)
Creatinine, Ser: 0.52 mg/dL (ref 0.44–1.00)
GFR calc Af Amer: >60 mL/min (ref >60)
GFR calc non Af Amer: >60 mL/min (ref >60)
Glucose, Bld: 99 mg/dL (ref 70–99)
Potassium: 4.8 mmol/L (ref 3.5–5.1)
Sodium: 135 mmol/L (ref 135–145)

## 2019-10-05 LAB — CBC
HCT: 40.9 % (ref 36.0–46.0)
Hemoglobin: 13.2 g/dL (ref 12.0–15.0)
MCH: 27.7 pg (ref 26.0–34.0)
MCHC: 32.3 g/dL (ref 30.0–36.0)
MCV: 85.7 fL (ref 80.0–100.0)
Platelets: 639 10*3/uL — ABNORMAL HIGH (ref 150–400)
RBC: 4.77 MIL/uL (ref 3.87–5.11)
RDW: 15.4 % (ref 11.5–15.5)
WBC: 11.2 10*3/uL — ABNORMAL HIGH (ref 4.0–10.5)
nRBC: 0 % (ref 0.0–0.2)

## 2019-10-05 LAB — HEPATITIS C GENOTYPE

## 2019-10-05 LAB — AEROBIC/ANAEROBIC CULTURE W GRAM STAIN (SURGICAL/DEEP WOUND)

## 2019-10-05 MED ORDER — CHLORHEXIDINE GLUCONATE CLOTH 2 % EX PADS
6.0000 | MEDICATED_PAD | Freq: Every day | CUTANEOUS | Status: DC
  Administered 2019-10-05 – 2019-12-11 (×45): 6 via TOPICAL

## 2019-10-05 NOTE — Progress Notes (Addendum)
Pt continuously called out for pain medicine, at least every hour. Sometimes patient would be sound asleep when I go in to give the med that she requested roughly 1-2 minutes prior. Pt also seems in a more sedated state, yet still demanding pain meds.  Left AC IV removed d/t infiltration. IV intact when removed.

## 2019-10-05 NOTE — Progress Notes (Signed)
[facility]  Date of Admission:  09/29/2019     Total days of antibiotics 7         ASSESSMENT:  Colleen Ramirez is POD 6 from C5-C6 cervical discectomy and fusion for cervical abscess with cultures positive for MRSA. Continues to have pain in her posterior neck and shoulders that she offers is poorly controlled, however chart notes indicate that she is sleepy and drowsy at times. Discussed importance of continued antibiotic therapy given the seriousness of her infection. Renal function has remained stable while on vancomycin. She will need at least 6 weeks of IV therapy from the date of her surgery 09/29/19.LIkely will need to remain hospitalized for duration of treatment given IVDU.     PLAN:  1. Continue vancomycin. 2. Therapeutic drug monitoring of renal function for nephrotoxicity.  3. Pain management / opioid use disorder per primary team.   Principal Problem:   Infection of cervical spine (HCC) Active Problems:   Sepsis (HCC)   Depression   Acquired fusion of cervical spine   IVDU (intravenous drug user)   Homeless   S/P cervical discectomy   Anxiety   Status post gastric bypass for obesity   Hepatitis C antibody positive in blood   Staph aureus infection   . Chlorhexidine Gluconate Cloth  6 each Topical Q0600  . [START ON 07/30/2020] docusate sodium  100 mg Oral BID  . feeding supplement (ENSURE ENLIVE)  237 mL Oral BID BM  . heparin injection (subcutaneous)  5,000 Units Subcutaneous Q8H  . lidocaine  1 patch Transdermal Daily  . LORazepam  0.5 mg Oral Once  . multivitamin with minerals  1 tablet Oral Daily  . mupirocin ointment   Nasal BID  . nicotine  21 mg Transdermal Daily  . rifampin  300 mg Oral Q12H  . sodium chloride flush  3 mL Intravenous Q12H    SUBJECTIVE:  Afebrile overnight. Rapid response called due to changing MEWS score likely related to pain.  Continues to have pain located in her posterior neck and shoulders. Has  some swelling of the right neck and also has some numbness/tingling of her left thumb and second finger which she has had since surgery.   No Known Allergies   Review of Systems: Review of Systems  Constitutional: Negative for chills, fever and weight loss.  Respiratory: Negative for cough, shortness of breath and wheezing.   Cardiovascular: Negative for chest pain and leg swelling.  Gastrointestinal: Negative for abdominal pain, constipation, diarrhea, nausea and vomiting.  Musculoskeletal: Positive for neck pain.       Positive for shoulder pain   Skin: Negative for rash.      OBJECTIVE: Vitals:   10/05/19 0024 10/05/19 0523 10/05/19 0816 10/05/19 1226  BP: (!) 116/95 (!) 118/95 122/87 (!) 114/92  Pulse: (!) 112 100 (!) 135 (!) 118  Resp:   18 18  Temp: 98.2 F (36.8 C)  98.4 F (36.9 C) 98.4 F (36.9 C)  TempSrc: Oral  Oral Oral  SpO2: 99% 99% 100% 100%  Weight:      Height:       Body mass index is 19.49 kg/m.  Physical Exam Constitutional:      General: She is not in acute distress.    Appearance: She is well-developed. She is ill-appearing.  Neck:     Comments: Surgical incision appears well approximated, clean and dry. There appears to be mild edema on the right side.  Cardiovascular:  Rate and Rhythm: Normal rate and regular rhythm.     Heart sounds: Normal heart sounds.  Pulmonary:     Effort: Pulmonary effort is normal.     Breath sounds: Normal breath sounds.  Skin:    General: Skin is warm and dry.  Neurological:     Mental Status: She is alert and oriented to person, place, and time.  Psychiatric:        Behavior: Behavior normal.        Thought Content: Thought content normal.        Judgment: Judgment normal.     Lab Results Lab Results  Component Value Date   WBC 11.2 (H) 10/05/2019   HGB 13.2 10/05/2019   HCT 40.9 10/05/2019   MCV 85.7 10/05/2019   PLT 639 (H) 10/05/2019    Lab Results  Component Value Date   CREATININE 0.52  10/05/2019   BUN 8 10/05/2019   NA 135 10/05/2019   K 4.8 10/05/2019   CL 100 10/05/2019   CO2 25 10/05/2019    Lab Results  Component Value Date   ALT 15 07/25/2019   AST 18 07/25/2019   ALKPHOS 57 07/25/2019   BILITOT 0.5 07/25/2019     Microbiology: Recent Results (from the past 240 hour(s))  SARS CORONAVIRUS 2 (TAT 6-24 HRS) Nasopharyngeal Nasopharyngeal Swab     Status: None   Collection Time: 09/29/19 11:38 AM   Specimen: Nasopharyngeal Swab  Result Value Ref Range Status   SARS Coronavirus 2 NEGATIVE NEGATIVE Final    Comment: (NOTE) SARS-CoV-2 target nucleic acids are NOT DETECTED. The SARS-CoV-2 RNA is generally detectable in upper and lower respiratory specimens during the acute phase of infection. Negative results do not preclude SARS-CoV-2 infection, do not rule out co-infections with other pathogens, and should not be used as the sole basis for treatment or other patient management decisions. Negative results must be combined with clinical observations, patient history, and epidemiological information. The expected result is Negative. Fact Sheet for Patients: SugarRoll.be Fact Sheet for Healthcare Providers: https://www.woods-mathews.com/ This test is not yet approved or cleared by the [place] FDA and  has been authorized for detection and/or diagnosis of SARS-CoV-2 by FDA under an Emergency Use Authorization (EUA). This EUA will remain  in effect (meaning this test can be used) for the duration of the COVID-19 declaration under Section 56 4(b)(1) of the Act, 21 U.S.C. section 360bbb-3(b)(1), unless the authorization is terminated or revoked sooner. Performed at [facility], [place] [address], [place], [place] [address]   Aerobic/Anaerobic Culture (surgical/deep wound)     Status: None (Preliminary result)   Collection Time: 09/29/19  9:43 PM   Specimen: Abscess  Result Value Ref Range Status   Specimen  Description ABSCESS  Final   Special Requests CERVICAL EPIDURAL  Final   Gram Stain   Final    RARE WBC PRESENT, PREDOMINANTLY PMN FEW GRAM POSITIVE COCCI IN CLUSTERS Performed at [facility], [place] [address], [place], [place] [address]    Culture   Final    ABUNDANT METHICILLIN RESISTANT STAPHYLOCOCCUS AUREUS NO ANAEROBES ISOLATED; CULTURE IN PROGRESS FOR 5 DAYS    Report Status PENDING  Incomplete   Organism ID, Bacteria METHICILLIN RESISTANT STAPHYLOCOCCUS AUREUS  Final      Susceptibility   Methicillin resistant staphylococcus aureus - MIC*    CIPROFLOXACIN >=8 RESISTANT Resistant     ERYTHROMYCIN >=8 RESISTANT Resistant     GENTAMICIN <=0.5 SENSITIVE Sensitive  OXACILLIN >=4 RESISTANT Resistant     TETRACYCLINE <=1 SENSITIVE Sensitive     VANCOMYCIN <=0.5 SENSITIVE Sensitive     TRIMETH/SULFA 80 RESISTANT Resistant     CLINDAMYCIN >=8 RESISTANT Resistant     RIFAMPIN <=0.5 SENSITIVE Sensitive     Inducible Clindamycin NEGATIVE Sensitive     * ABUNDANT METHICILLIN RESISTANT STAPHYLOCOCCUS AUREUS  MRSA PCR Screening     Status: Abnormal   Collection Time: 10/01/19  6:23 PM   Specimen: Nasal Mucosa; Nasopharyngeal  Result Value Ref Range Status   MRSA by PCR POSITIVE (A) NEGATIVE Final    Comment:        The GeneXpert MRSA Assay (FDA approved for NASAL specimens only), is one component of a comprehensive MRSA colonization surveillance program. It is not intended to diagnose MRSA infection nor to guide or monitor treatment for MRSA infections. RESULT CALLED TO, READ BACK BY AND VERIFIED WITH: A NEZIUS RN 10/01/19 2000 JDW Performed at [facility], [address] [place], [place] [address]      Marcos Eke, NP [facility] [phone] Pager  10/05/2019  12:27 PM

## 2019-10-05 NOTE — Plan of Care (Signed)

## 2019-10-05 NOTE — Progress Notes (Signed)
Physical Therapy Treatment Patient Details Name: Colleen Ramirez MRN: [id number] DOB: Jan 30, 1981 Today's Date: 10/05/2019    History of Present Illness Colleen Ramirez is a 39 y.o. F admitted to the ED with c/o severe neck pain secondary to a cervical epidural abscess. She is s/p C5-6 anterior cervical discectomy and instrumented fusion. PMH: IVDU, anemia, anxiety, depression, recent admission for facial cellulitis.     PT Comments    Pt admitted for above. She demonstrated steady progress with mobility today by increasing her gait distance. She required supervision with bed mobility and transfers, and min guard for gait for safety. She required cues for safety throughout session. She remains limited by pain, and declined further mobility or sitting in the chair. Continue to recommend SNF upon acute discharge due to lack of support. She would benefit from continued skilled PT intervention as described below.   Follow Up Recommendations  SNF     Equipment Recommendations  Other (comment)(TBD next venue)    Recommendations for Other Services       Precautions / Restrictions Precautions Precautions: Fall;Cervical Precaution Booklet Issued: Yes (comment) Restrictions Weight Bearing Restrictions: No    Mobility  Bed Mobility Overal bed mobility: Needs Assistance Bed Mobility: Rolling;Sidelying to Sit Rolling: Supervision Sidelying to sit: Supervision       General bed mobility comments: increased time and effort, but completed log roll towards L side without cueing; supervision for safety; mod use of bed rails  Transfers Overall transfer level: Needs assistance Equipment used: Rolling walker (2 wheeled) Transfers: Sit to/from Stand Sit to Stand: Supervision         General transfer comment: supervision for safety; minimal increased time to power up, no physical assistance required  Ambulation/Gait Ambulation/Gait assistance: Min guard Gait Distance (Feet): 40  Feet Assistive device: None Gait Pattern/deviations: Step-through pattern;Decreased stride length Gait velocity: decreased   General Gait Details: min guard for safety; pt completed 2 full laps around the room without AD; declined further mobility   Stairs             Wheelchair Mobility    Modified Rankin (Stroke Patients Only)       Balance Overall balance assessment: Mild deficits observed, not formally tested                                          Cognition Arousal/Alertness: Lethargic Behavior During Therapy: Flat affect Overall Cognitive Status: Within Functional Limits for tasks assessed                                        Exercises      General Comments        Pertinent Vitals/Pain Pain Assessment: Faces Faces Pain Scale: Hurts whole lot Pain Location: neck Pain Descriptors / Indicators: Aching;Constant;Discomfort;Grimacing;Guarding;Moaning;Shooting;Sharp Pain Intervention(s): Limited activity within patient's tolerance;Monitored during session;Repositioned    Home Living                      Prior Function            PT Goals (current goals can now be found in the care plan section) Acute Rehab PT Goals Patient Stated Goal: decrease pain PT Goal Formulation: With patient Time For Goal Achievement: 10/14/19 Potential to Achieve Goals: Good Progress towards PT  goals: Progressing toward goals    Frequency    Min 2X/week      PT Plan Frequency needs to be updated    Co-evaluation              AM-PAC PT "6 Clicks" Mobility   Outcome Measure  Help needed turning from your back to your side while in a flat bed without using bedrails?: A Little Help needed moving from lying on your back to sitting on the side of a flat bed without using bedrails?: A Little Help needed moving to and from a bed to a chair (including a wheelchair)?: None Help needed standing up from a chair using your  arms (e.g., wheelchair or bedside chair)?: None Help needed to walk in hospital room?: A Little Help needed climbing 3-5 steps with a railing? : A Little 6 Click Score: 20    End of Session Equipment Utilized During Treatment: Gait belt Activity Tolerance: Patient tolerated treatment well;Patient limited by pain Patient left: in bed;with call bell/phone within reach Nurse Communication: Mobility status PT Visit Diagnosis: Unsteadiness on feet (R26.81);Other abnormalities of gait and mobility (R26.89);Muscle weakness (generalized) (M62.81);Pain Pain - part of body: (neck)     Time: 8144-8185 PT Time Calculation (min) (ACUTE ONLY): 15 min  Charges:  $Gait Training: 8-22 mins                     Wyvonna Plum, SPT    Verdene Lennert 10/05/2019, 12:54 PM

## 2019-10-05 NOTE — Progress Notes (Signed)
PROGRESS NOTE  Colleen Ramirez URK:[id number] DOB: [birth year] DOA: 09/29/2019 PCP: Patient, No Pcp Per   LOS: 6 days   Brief Narrative / Interim history: Colleen Ramirez is an 39 y.o. female IV drug use (methamphetamine), anemia, anxiety, depression,  s/p gastric bypass, and homelessness, who initially presented with a 2-day history of severe sharp neck pain. Pain was constant, but worse with any kind of movement.   Admits to last using "Ice" for crystal meth approximately 2 days PTA. Patient injects into her arms.  Just recently hospitalized from12/1/20-12/8/20 with facial cellulitis. She was doxycycline 100 mg twice daily  to complete a 10-day course and clindamycin external solution, but it appears not to have completed this.  Subjective / 24h Interval events: Complains of persistent pain today, she alternates between deep sleep and awake and crying.   Assessment & Plan: Principal Problem Sepsis due to cervical epidural abscess -was tachycardic in ER, has elevated WBC -neurosurgery took patient to OR on 1/12 -intraop cultures with MRSA, infectious disease following and currently she is on vancomycin.  She needs prolonged IV antibiotics, not a candidate for PICC line due to IV drug use -Continue pain control, she tends to be oversedated at times  Active Problems Polysubstance abuse -mostly meth per patient -TOC consult -not a candidate for PICC line  HCV positive -per ID  Dysphagia -Status post Decadron x1 per neurosurgery   Scheduled Meds: . Chlorhexidine Gluconate Cloth  6 each Topical Q0600  . [START ON 07/30/2020] docusate sodium  100 mg Oral BID  . feeding supplement (ENSURE ENLIVE)  237 mL Oral BID BM  . heparin injection (subcutaneous)  5,000 Units Subcutaneous Q8H  . lidocaine  1 patch Transdermal Daily  . LORazepam  0.5 mg Oral Once  . multivitamin with minerals  1 tablet Oral Daily  . mupirocin ointment   Nasal BID  . nicotine  21 mg Transdermal Daily  . rifampin   300 mg Oral Q12H  . sodium chloride flush  3 mL Intravenous Q12H   Continuous Infusions: . sodium chloride 250 mL (09/30/19 0126)  . vancomycin 750 mg (10/05/19 0501)   PRN Meds:.acetaminophen **OR** acetaminophen, cyclobenzaprine, HYDROmorphone (DILAUDID) injection, menthol-cetylpyridinium **OR** phenol, ondansetron **OR** ondansetron (ZOFRAN) IV, oxyCODONE, polyethylene glycol, sodium chloride flush  DVT prophylaxis: SCDs Code Status: Full code Family Communication: no fmaily at bedside  Patient admitted from: home  Anticipated d/c place: TBD Barriers to d/c: prolonged IV antibiotics  Procedures:  CERVICAL FIVE TO CERVICALSIX ANTERIOR DISCECTOMY AND INSTRUMENTED FUSION (N/A) by Dr. Zada Finders 1/12  2d echo IMPRESSIONS  1. Left ventricular ejection fraction, by visual estimation, is 65 to 70%. The left ventricle has hyperdynamic function. There is no left ventricular hypertrophy.  2. Left ventricular diastolic parameters are consistent with Grade I diastolic dysfunction (impaired relaxation).  3. The left ventricle has no regional wall motion abnormalities.  4. Global right ventricle has normal systolic function.The right ventricular size is normal. No increase in right ventricular wall thickness.  5. Left atrial size was normal.  6. Right atrial size was normal.  7. The mitral valve is normal in structure. No evidence of mitral valve regurgitation. No evidence of mitral stenosis.  8. The aortic valve is tricuspid. Aortic valve regurgitation is not visualized. No evidence of aortic valve sclerosis or stenosis.  9. TR signal is inadequate for assessing pulmonary artery systolic pressure. 10. The inferior vena cava is normal in size with greater than 50% respiratory variability, suggesting right atrial pressure of 3 mmHg.  11. The tricuspid valve is normal in structure.  Microbiology  Abscess culture 1/12 - GPC in clusters, pending  SARS CoV 2 1/12 - negative    Antimicrobials: Vancomycin 1/12 >>    Objective: Vitals:   10/05/19 0024 10/05/19 0523 10/05/19 0816 10/05/19 1226  BP: (!) 116/95 (!) 118/95 122/87 (!) 114/92  Pulse: (!) 112 100 (!) 135 (!) 118  Resp:   18 18  Temp: 98.2 F (36.8 C)  98.4 F (36.9 C) 98.4 F (36.9 C)  TempSrc: Oral  Oral Oral  SpO2: 99% 99% 100% 100%  Weight:      Height:        Intake/Output Summary (Last 24 hours) at 10/05/2019 1349 Last data filed at 10/05/2019 2774 Gross per 24 hour  Intake 398.12 ml  Output 800 ml  Net -401.88 ml   Filed Weights   09/29/19 2032  Weight: 49.9 kg    Examination:  Constitutional: Sleeping when I entered the room but crying in pain upon waking up Eyes: No scleral icterus ENMT: Moist mucous membranes Neck: normal, supple.  Surgical scar present Respiratory: Clear bilaterally without wheezing or crackles, normal respiratory effort Cardiovascular: Regular rate and rhythm, no murmurs, no peripheral edema Abdomen: Soft, nontender, nondistended, bowel sounds positive Musculoskeletal: no clubbing / cyanosis.  Neurologic: No focal deficits   Data Reviewed: I have independently reviewed following labs and imaging studies   CBC: Recent Labs  Lab 09/29/19 1214 10/01/19 0445 10/02/19 0342 10/03/19 0313 10/05/19 0546  WBC 13.8* 14.9* 10.4 9.8 11.2*  NEUTROABS 11.7* 11.5*  --   --   --   HGB 12.5 11.1* 11.3* 12.1 13.2  HCT 38.8 34.2* 35.1* 36.6 40.9  MCV 86.6 86.4 86.0 84.9 85.7  PLT 316 308 401* 446* 639*   Basic Metabolic Panel: Recent Labs  Lab 09/29/19 1214 09/29/19 1214 10/01/19 0445 10/02/19 0342 10/03/19 0313 10/04/19 1206 10/05/19 0546  NA 134*  --  135 137 133*  --  135  K 3.8  --  4.1 3.6 3.3*  --  4.8  CL 99  --  101 101 98  --  100  CO2 25  --  23 27 24   --  25  GLUCOSE 111*  --  98 102* 156*  --  99  BUN 10  --  7 5* 5*  --  8  CREATININE 0.49   < > 0.46 0.43* 0.52 0.48 0.52  CALCIUM 8.8*  --  8.3* 8.7* 8.6*  --  9.3   < > = values  in this interval not displayed.   Liver Function Tests: No results for input(s): AST, ALT, ALKPHOS, BILITOT, PROT, ALBUMIN in the last 168 hours. Coagulation Profile: No results for input(s): INR, PROTIME in the last 168 hours. HbA1C: No results for input(s): HGBA1C in the last 72 hours. CBG: No results for input(s): GLUCAP in the last 168 hours.  Recent Results (from the past 240 hour(s))  SARS CORONAVIRUS 2 (TAT 6-24 HRS) Nasopharyngeal Nasopharyngeal Swab     Status: None   Collection Time: 09/29/19 11:38 AM   Specimen: Nasopharyngeal Swab  Result Value Ref Range Status   SARS Coronavirus 2 NEGATIVE NEGATIVE Final    Comment: (NOTE) SARS-CoV-2 target nucleic acids are NOT DETECTED. The SARS-CoV-2 RNA is generally detectable in upper and lower respiratory specimens during the acute phase of infection. Negative results do not preclude SARS-CoV-2 infection, do not rule out co-infections with other pathogens, and should not be used as the  sole basis for treatment or other patient management decisions. Negative results must be combined with clinical observations, patient history, and epidemiological information. The expected result is Negative. Fact Sheet for Patients: HairSlick.no Fact Sheet for Healthcare Providers: quierodirigir.com This test is not yet approved or cleared by the [place] FDA and  has been authorized for detection and/or diagnosis of SARS-CoV-2 by FDA under an Emergency Use Authorization (EUA). This EUA will remain  in effect (meaning this test can be used) for the duration of the COVID-19 declaration under Section 56 4(b)(1) of the Act, 21 U.S.C. section 360bbb-3(b)(1), unless the authorization is terminated or revoked sooner. Performed at [facility], [address] [place], [place] [address]   Aerobic/Anaerobic Culture (surgical/deep wound)     Status: None   Collection Time: 09/29/19   9:43 PM   Specimen: Abscess  Result Value Ref Range Status   Specimen Description ABSCESS  Final   Special Requests CERVICAL EPIDURAL  Final   Gram Stain   Final    RARE WBC PRESENT, PREDOMINANTLY PMN FEW GRAM POSITIVE COCCI IN CLUSTERS    Culture   Final    ABUNDANT METHICILLIN RESISTANT STAPHYLOCOCCUS AUREUS NO ANAEROBES ISOLATED Performed at [facility], [address] [place], [place] [address]    Report Status 10/05/2019 FINAL  Final   Organism ID, Bacteria METHICILLIN RESISTANT STAPHYLOCOCCUS AUREUS  Final      Susceptibility   Methicillin resistant staphylococcus aureus - MIC*    CIPROFLOXACIN >=8 RESISTANT Resistant     ERYTHROMYCIN >=8 RESISTANT Resistant     GENTAMICIN <=0.5 SENSITIVE Sensitive     OXACILLIN >=4 RESISTANT Resistant     TETRACYCLINE <=1 SENSITIVE Sensitive     VANCOMYCIN <=0.5 SENSITIVE Sensitive     TRIMETH/SULFA 80 RESISTANT Resistant     CLINDAMYCIN >=8 RESISTANT Resistant     RIFAMPIN <=0.5 SENSITIVE Sensitive     Inducible Clindamycin NEGATIVE Sensitive     * ABUNDANT METHICILLIN RESISTANT STAPHYLOCOCCUS AUREUS  MRSA PCR Screening     Status: Abnormal   Collection Time: 10/01/19  6:23 PM   Specimen: Nasal Mucosa; Nasopharyngeal  Result Value Ref Range Status   MRSA by PCR POSITIVE (A) NEGATIVE Final    Comment:        The GeneXpert MRSA Assay (FDA approved for NASAL specimens only), is one component of a comprehensive MRSA colonization surveillance program. It is not intended to diagnose MRSA infection nor to guide or monitor treatment for MRSA infections. RESULT CALLED TO, READ BACK BY AND VERIFIED WITH: A NEZIUS RN 10/01/19 2000 JDW Performed at [facility], [address], [place], [place] [address]      Radiology Studies: No results found.  Pamella Pert, MD, PhD [facility]  Between 7 am - 7 pm I am available, please contact me via Amion or Securechat  Between 7 pm - 7 am I am not available, please  contact night coverage MD/APP via Amion

## 2019-10-05 NOTE — Plan of Care (Signed)
  Problem: Health Behavior/Discharge Planning: Goal: Ability to manage health-related needs will improve Outcome: Progressing   Problem: Activity: Goal: Risk for activity intolerance will decrease Outcome: Progressing   Problem: Pain Managment: Goal: General experience of comfort will improve Outcome: Not Progressing   

## 2019-10-05 NOTE — Progress Notes (Signed)
Neurosurgery Service Progress Note  Subjective: No acute events overnight  Objective: Vitals:   10/04/19 2242 10/05/19 0024 10/05/19 0523 10/05/19 0816  BP: 107/85 (!) 116/95 (!) 118/95 122/87  Pulse: (!) 128 (!) 112 100 (!) 135  Resp: 14   18  Temp: 98.4 F (36.9 C) 98.2 F (36.8 C)  98.4 F (36.9 C)  TempSrc: Oral Oral  Oral  SpO2: 99% 99% 99% 100%  Weight:      Height:       Temp (24hrs), Avg:98.7 F (37.1 C), Min:98.2 F (36.8 C), Max:99.2 F (37.3 C)  CBC Latest Ref Rng & Units 10/05/2019 10/03/2019 10/02/2019  WBC 4.0 - 10.5 K/uL 11.2(H) 9.8 10.4  Hemoglobin 12.0 - 15.0 g/dL 03.5 59.7 11.3(L)  Hematocrit 36.0 - 46.0 % 40.9 36.6 35.1(L)  Platelets 150 - 400 K/uL 639(H) 446(H) 401(H)   BMP Latest Ref Rng & Units 10/05/2019 10/04/2019 10/03/2019  Glucose 70 - 99 mg/dL 99 - 416(L)  BUN 6 - 20 mg/dL 8 - 5(L)  Creatinine 8.45 - 1.00 mg/dL 3.64 6.80 3.21  Sodium 135 - 145 mmol/L 135 - 133(L)  Potassium 3.5 - 5.1 mmol/L 4.8 - 3.3(L)  Chloride 98 - 111 mmol/L 100 - 98  CO2 22 - 32 mmol/L 25 - 24  Calcium 8.9 - 10.3 mg/dL 9.3 - 8.6(L)    Intake/Output Summary (Last 24 hours) at 10/05/2019 0827 Last data filed at 10/05/2019 0307 Gross per 24 hour  Intake 758.12 ml  Output 800 ml  Net -41.88 ml    Current Facility-Administered Medications:  .  0.9 %  sodium chloride infusion, 250 mL, Intravenous, Continuous, Nancylee Gaines A, MD, Last Rate: 1 mL/hr at 09/30/19 0126, 250 mL at 09/30/19 0126 .  acetaminophen (TYLENOL) tablet 650 mg, 650 mg, Oral, Q4H PRN, 650 mg at 10/03/19 1430 **OR** acetaminophen (TYLENOL) suppository 650 mg, 650 mg, Rectal, Q4H PRN, Jadene Pierini, MD .  cyclobenzaprine (FLEXERIL) tablet 10 mg, 10 mg, Oral, TID PRN, Jadene Pierini, MD, 10 mg at 10/05/19 0604 .  [START ON 07/30/2020] docusate sodium (COLACE) capsule 100 mg, 100 mg, Oral, BID, Tyden Kann A, MD .  feeding supplement (ENSURE ENLIVE) (ENSURE ENLIVE) liquid 237 mL, 237 mL,  Oral, BID BM, Chequita Mofield A, MD, 237 mL at 10/04/19 1328 .  heparin injection 5,000 Units, 5,000 Units, Subcutaneous, Q8H, Nikolai Wilczak, Clovis Pu, MD, 5,000 Units at 10/05/19 0502 .  HYDROmorphone (DILAUDID) injection 1 mg, 1 mg, Intravenous, Q4H PRN, Leandro Reasoner Tublu, MD, 1 mg at 10/05/19 0425 .  lidocaine (LIDODERM) 5 % 1 patch, 1 patch, Transdermal, Daily, Vann, Jessica U, DO .  LORazepam (ATIVAN) tablet 0.5 mg, 0.5 mg, Oral, Once, [place], Wylder S, PA .  menthol-cetylpyridinium (CEPACOL) lozenge 3 mg, 1 lozenge, Oral, PRN **OR** phenol (CHLORASEPTIC) mouth spray 1 spray, 1 spray, Mouth/Throat, PRN, Sharda Keddy, Clovis Pu, MD .  multivitamin with minerals tablet 1 tablet, 1 tablet, Oral, Daily, Jadene Pierini, MD, 1 tablet at 10/04/19 0845 .  mupirocin ointment (BACTROBAN) 2 %, , Nasal, BID, Joseph Art, DO, Given at 10/04/19 2237 .  nicotine (NICODERM CQ - dosed in mg/24 hours) patch 21 mg, 21 mg, Transdermal, Daily, Katrinka Blazing, Rondell A, MD, 21 mg at 10/03/19 0956 .  ondansetron (ZOFRAN) tablet 4 mg, 4 mg, Oral, Q6H PRN **OR** ondansetron (ZOFRAN) injection 4 mg, 4 mg, Intravenous, Q6H PRN, Milley Vining A, MD .  oxyCODONE (Oxy IR/ROXICODONE) immediate release tablet 15 mg, 15 mg, Oral, Q4H PRN,  Caren Griffins, MD, 15 mg at 10/05/19 0746 .  polyethylene glycol (MIRALAX / GLYCOLAX) packet 17 g, 17 g, Oral, Daily PRN, Lollie Gunner A, MD .  rifampin (RIFADIN) capsule 300 mg, 300 mg, Oral, Q12H, Michel Bickers, MD, 300 mg at 10/04/19 2237 .  sodium chloride flush (NS) 0.9 % injection 3 mL, 3 mL, Intravenous, Q12H, Vinicio Lynk, Joyice Faster, MD, 3 mL at 10/04/19 2237 .  sodium chloride flush (NS) 0.9 % injection 3 mL, 3 mL, Intravenous, PRN, Judith Part, MD .  vancomycin (VANCOCIN) IVPB 750 mg/150 ml premix, 750 mg, Intravenous, Q8H, [name], [facility], Last Rate: 150 mL/hr at 10/05/19 0501, 750 mg at 10/05/19 0501   Assessment & Plan: 39 y.o. woman w/  polysubstance abuse, severe neck pain, cervical epidural abscess, 1/12 s/p C5-6 ACDF for abscess evacuation. Gram stain w/ GPCs in clusters. Echo w/ no vegetations  -medicine recs, ID recs, pharmacy recs, on vanc/rifampin -can wear a soft collar for comfort, may help with neck pain, can wear prn -PT/OT rec SNF, pt is homeless, SW recs for placement, from my perspective, ready for transfer from SNF when able -SCDs/TEDs, SQH   Judith Part  10/05/19 8:27 AM

## 2019-10-06 LAB — CBC
HCT: 39 % (ref 36.0–46.0)
Hemoglobin: 12.5 g/dL (ref 12.0–15.0)
MCH: 28 pg (ref 26.0–34.0)
MCHC: 32.1 g/dL (ref 30.0–36.0)
MCV: 87.2 fL (ref 80.0–100.0)
Platelets: 674 10*3/uL — ABNORMAL HIGH (ref 150–400)
RBC: 4.47 MIL/uL (ref 3.87–5.11)
RDW: 15.5 % (ref 11.5–15.5)
WBC: 11.8 10*3/uL — ABNORMAL HIGH (ref 4.0–10.5)
nRBC: 0 % (ref 0.0–0.2)

## 2019-10-06 LAB — BASIC METABOLIC PANEL
Anion gap: 13 (ref 5–15)
BUN: 8 mg/dL (ref 6–20)
CO2: 24 mmol/L (ref 22–32)
Calcium: 8.9 mg/dL (ref 8.9–10.3)
Chloride: 98 mmol/L (ref 98–111)
Creatinine, Ser: 0.6 mg/dL (ref 0.44–1.00)
GFR calc Af Amer: >60 mL/min (ref >60)
GFR calc non Af Amer: >60 mL/min (ref >60)
Glucose, Bld: 147 mg/dL — ABNORMAL HIGH (ref 70–99)
Potassium: 3.8 mmol/L (ref 3.5–5.1)
Sodium: 135 mmol/L (ref 135–145)

## 2019-10-06 LAB — VANCOMYCIN, PEAK: Vancomycin Pk: 32 ug/mL (ref 30–40)

## 2019-10-06 LAB — VANCOMYCIN, TROUGH: Vancomycin Tr: 11 ug/mL — ABNORMAL LOW (ref 15–20)

## 2019-10-06 NOTE — Progress Notes (Signed)
PROGRESS NOTE  Colleen Ramirez EAV:[id number] DOB: [birth year] DOA: 09/29/2019 PCP: Patient, No Pcp Per   LOS: 7 days   Brief Narrative / Interim history: Colleen Ramirez is an 39 y.o. female IV drug use (methamphetamine), anemia, anxiety, depression,  s/p gastric bypass, and homelessness, who initially presented with a 2-day history of severe sharp neck pain. Pain was constant, but worse with any kind of movement. Admits to last using "Ice" for crystal meth approximately 2 days PTA. Patient injects into her arms.  Just recently hospitalized from12/1/20-12/8/20 with facial cellulitis. She was doxycycline 100 mg twice daily  to complete a 10-day course and clindamycin external solution, but it appears not to have completed this.  Subjective / 24h Interval events: Continues to persistently requests additional pain medication, complaining of persistent pain.  No other complaints at this time.  Continues on IV antibiotics with rifampin/vancomycin; will likely remain inpatient for duration of therapy given active IVDU.  No other complaints or concerns at this time.  Denies headache, no chest pain, palpitations, no shortness of breath, no abdominal pain.  Refusing subcutaneous heparin. No other acute events overnight per nursing staff.  Assessment & Plan:  Sepsis due to cervical epidural abscess, present on admission Patient presented with tachycardia, elevated WBC count.  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  Patient underwent ACDF by neurosurgery on 09/29/2019.  Intraoperative cultures with MRSA. --Infectious disease, neurosurgery following, appreciate assistance --Continue vancomycin/rifampin per ID; plan 6-week course from surgery 09/29/2019 --Not a candidate for outpatient IV antibiotics with PICC line 2/2 current IVDU --Continue pain control, she tends to be oversedated at times; will  continue pain management current levels  Polysubstance abuse Patient reports mostly methamphetamine use in which he injects into her arms.  Not a candidate for PICC line as above. mostly meth per patient --TOC consult  HCV positive --Per infectious disease  Dysphagia --Status post Decadron x1 per neurosurgery   Scheduled Meds: . Chlorhexidine Gluconate Cloth  6 each Topical Q0600  . [START ON 07/30/2020] docusate sodium  100 mg Oral BID  . feeding supplement (ENSURE ENLIVE)  237 mL Oral BID BM  . heparin injection (subcutaneous)  5,000 Units Subcutaneous Q8H  . lidocaine  1 patch Transdermal Daily  . LORazepam  0.5 mg Oral Once  . multivitamin with minerals  1 tablet Oral Daily  . mupirocin ointment   Nasal BID  . nicotine  21 mg Transdermal Daily  . rifampin  300 mg Oral Q12H  . sodium chloride flush  3 mL Intravenous Q12H   Continuous Infusions: . sodium chloride 250 mL (09/30/19 0126)  . vancomycin 750 mg (10/06/19 1503)   PRN Meds:.acetaminophen **OR** acetaminophen, cyclobenzaprine, HYDROmorphone (DILAUDID) injection, menthol-cetylpyridinium **OR** phenol, ondansetron **OR** ondansetron (ZOFRAN) IV, oxyCODONE, polyethylene glycol, sodium chloride flush  DVT prophylaxis: SCDs, heparin Code Status: Full code Family Communication: no family at bedside  Patient admitted from: home  Anticipated d/c place: TBD Barriers to d/c: prolonged IV antibiotics  Procedures:  CERVICAL FIVE TO CERVICALSIX ANTERIOR DISCECTOMY AND INSTRUMENTED FUSION (N/A) by Dr. Maurice Small 1/12  2d echo IMPRESSIONS  1. Left ventricular ejection fraction, by visual estimation, is 65 to 70%. The left ventricle has hyperdynamic function. There is no left ventricular hypertrophy.  2. Left ventricular diastolic parameters are consistent with Grade I diastolic dysfunction (impaired relaxation).  3. The left ventricle has no regional wall motion abnormalities.  4. Global right ventricle has normal systolic  function.The right ventricular  size is normal. No increase in right ventricular wall thickness.  5. Left atrial size was normal.  6. Right atrial size was normal.  7. The mitral valve is normal in structure. No evidence of mitral valve regurgitation. No evidence of mitral stenosis.  8. The aortic valve is tricuspid. Aortic valve regurgitation is not visualized. No evidence of aortic valve sclerosis or stenosis.  9. TR signal is inadequate for assessing pulmonary artery systolic pressure. 10. The inferior vena cava is normal in size with greater than 50% respiratory variability, suggesting right atrial pressure of 3 mmHg. 11. The tricuspid valve is normal in structure.  Microbiology  Abscess culture 1/12 - MRSA SARS CoV 2 1/12 - negative   Antimicrobials: Vancomycin 1/12 >>  Rifampin 1/14>>  Anti-infectives (From admission, onward)   Start     Dose/Rate Route Frequency Ordered Stop   10/04/19 2200  vancomycin (VANCOCIN) IVPB 750 mg/150 ml premix     750 mg 150 mL/hr over 60 Minutes Intravenous Every 8 hours 10/04/19 1305     10/04/19 1400  vancomycin (VANCOCIN) IVPB 1000 mg/200 mL premix     1,000 mg 200 mL/hr over 60 Minutes Intravenous STAT 10/04/19 1305 10/04/19 1604   10/01/19 1100  rifampin (RIFADIN) capsule 300 mg     300 mg Oral Every 12 hours 10/01/19 1056     09/30/19 1800  Vancomycin (VANCOCIN) 1,250 mg in sodium chloride 0.9 % 250 mL IVPB  Status:  Discontinued     1,250 mg 166.7 mL/hr over 90 Minutes Intravenous Every 24 hours 09/30/19 0003 09/30/19 0747   09/30/19 1800  vancomycin (VANCOREADY) IVPB 1250 mg/250 mL  Status:  Discontinued     1,250 mg 166.7 mL/hr over 90 Minutes Intravenous Every 24 hours 09/30/19 0746 10/04/19 1305   09/30/19 0400  piperacillin-tazobactam (ZOSYN) IVPB 3.375 g  Status:  Discontinued     3.375 g 12.5 mL/hr over 240 Minutes Intravenous Every 8 hours 09/30/19 0003 09/30/19 1533   09/29/19 2145  piperacillin-tazobactam (ZOSYN) IVPB 3.375 g       3.375 g 12.5 mL/hr over 240 Minutes Intravenous STAT 09/29/19 2137 09/29/19 2148   09/29/19 2119  bacitracin 50,000 Units in sodium chloride 0.9 % 500 mL irrigation  Status:  Discontinued       As needed 09/29/19 2119 09/29/19 2249   09/29/19 2033  vancomycin (VANCOCIN) 1-5 GM/200ML-% IVPB    Note to Pharmacy: Toney Sang   : cabinet override      09/29/19 2033 09/30/19 0844        Objective: Vitals:   10/05/19 2049 10/06/19 0408 10/06/19 0733 10/06/19 1220  BP: 120/84 97/67 114/90 112/81  Pulse: (!) 107 100 (!) 116 (!) 121  Resp: 18 18 17 20   Temp: 98.5 F (36.9 C) 98.6 F (37 C) 98 F (36.7 C) 98.1 F (36.7 C)  TempSrc: Oral Oral Oral Oral  SpO2: 100% 99% 100% 100%  Weight:      Height:       No intake or output data in the 24 hours ending 10/06/19 1525 Filed Weights   09/29/19 2032  Weight: 49.9 kg    Examination:  Constitutional: Sleeping when I entered the room but crying in pain upon waking up Eyes: No scleral icterus ENMT: Moist mucous membranes Neck: normal, supple.  Surgical scar present with some surrounding ecchymosis Respiratory: Clear bilaterally without wheezing or crackles, normal respiratory effort, oxygenating well on room air Cardiovascular: Regular rate and rhythm, no murmurs, no peripheral edema  Abdomen: Soft, nontender, nondistended, bowel sounds positive Musculoskeletal: no clubbing / cyanosis.  Neurologic: No focal deficits   Data Reviewed: I have independently reviewed following labs and imaging studies   CBC: Recent Labs  Lab 10/01/19 0445 10/02/19 0342 10/03/19 0313 10/05/19 0546 10/06/19 0752  WBC 14.9* 10.4 9.8 11.2* 11.8*  NEUTROABS 11.5*  --   --   --   --   HGB 11.1* 11.3* 12.1 13.2 12.5  HCT 34.2* 35.1* 36.6 40.9 39.0  MCV 86.4 86.0 84.9 85.7 87.2  PLT 308 401* 446* 639* 674*   Basic Metabolic Panel: Recent Labs  Lab 10/01/19 0445 10/01/19 0445 10/02/19 0342 10/03/19 0313 10/04/19 1206 10/05/19 0546  10/06/19 0752  NA 135  --  137 133*  --  135 135  K 4.1  --  3.6 3.3*  --  4.8 3.8  CL 101  --  101 98  --  100 98  CO2 23  --  27 24  --  25 24  GLUCOSE 98  --  102* 156*  --  99 147*  BUN 7  --  5* 5*  --  8 8  CREATININE 0.46   < > 0.43* 0.52 0.48 0.52 0.60  CALCIUM 8.3*  --  8.7* 8.6*  --  9.3 8.9   < > = values in this interval not displayed.   Liver Function Tests: No results for input(s): AST, ALT, ALKPHOS, BILITOT, PROT, ALBUMIN in the last 168 hours. Coagulation Profile: No results for input(s): INR, PROTIME in the last 168 hours. HbA1C: No results for input(s): HGBA1C in the last 72 hours. CBG: No results for input(s): GLUCAP in the last 168 hours.  Recent Results (from the past 240 hour(s))  SARS CORONAVIRUS 2 (TAT 6-24 HRS) Nasopharyngeal Nasopharyngeal Swab     Status: None   Collection Time: 09/29/19 11:38 AM   Specimen: Nasopharyngeal Swab  Result Value Ref Range Status   SARS Coronavirus 2 NEGATIVE NEGATIVE Final    Comment: (NOTE) SARS-CoV-2 target nucleic acids are NOT DETECTED. The SARS-CoV-2 RNA is generally detectable in upper and lower respiratory specimens during the acute phase of infection. Negative results do not preclude SARS-CoV-2 infection, do not rule out co-infections with other pathogens, and should not be used as the sole basis for treatment or other patient management decisions. Negative results must be combined with clinical observations, patient history, and epidemiological information. The expected result is Negative. Fact Sheet for Patients: HairSlick.no Fact Sheet for Healthcare Providers: quierodirigir.com This test is not yet approved or cleared by the [place] FDA and  has been authorized for detection and/or diagnosis of SARS-CoV-2 by FDA under an Emergency Use Authorization (EUA). This EUA will remain  in effect (meaning this test can be used) for the duration of  the COVID-19 declaration under Section 56 4(b)(1) of the Act, 21 U.S.C. section 360bbb-3(b)(1), unless the authorization is terminated or revoked sooner. Performed at [facility], [address] [place], [place] [address]   Aerobic/Anaerobic Culture (surgical/deep wound)     Status: None   Collection Time: 09/29/19  9:43 PM   Specimen: Abscess  Result Value Ref Range Status   Specimen Description ABSCESS  Final   Special Requests CERVICAL EPIDURAL  Final   Gram Stain   Final    RARE WBC PRESENT, PREDOMINANTLY PMN FEW GRAM POSITIVE COCCI IN CLUSTERS    Culture   Final    ABUNDANT METHICILLIN RESISTANT STAPHYLOCOCCUS AUREUS NO ANAEROBES ISOLATED Performed  at [facility], [place] [address] [place], [place] [address]    Report Status 10/05/2019 FINAL  Final   Organism ID, Bacteria METHICILLIN RESISTANT STAPHYLOCOCCUS AUREUS  Final      Susceptibility   Methicillin resistant staphylococcus aureus - MIC*    CIPROFLOXACIN >=8 RESISTANT Resistant     ERYTHROMYCIN >=8 RESISTANT Resistant     GENTAMICIN <=0.5 SENSITIVE Sensitive     OXACILLIN >=4 RESISTANT Resistant     TETRACYCLINE <=1 SENSITIVE Sensitive     VANCOMYCIN <=0.5 SENSITIVE Sensitive     TRIMETH/SULFA 80 RESISTANT Resistant     CLINDAMYCIN >=8 RESISTANT Resistant     RIFAMPIN <=0.5 SENSITIVE Sensitive     Inducible Clindamycin NEGATIVE Sensitive     * ABUNDANT METHICILLIN RESISTANT STAPHYLOCOCCUS AUREUS  MRSA PCR Screening     Status: Abnormal   Collection Time: 10/01/19  6:23 PM   Specimen: Nasal Mucosa; Nasopharyngeal  Result Value Ref Range Status   MRSA by PCR POSITIVE (A) NEGATIVE Final    Comment:        The GeneXpert MRSA Assay (FDA approved for NASAL specimens only), is one component of a comprehensive MRSA colonization surveillance program. It is not intended to diagnose MRSA infection nor to guide or monitor treatment for MRSA infections. RESULT CALLED TO, READ BACK BY AND VERIFIED  WITH: A NEZIUS RN 10/01/19 2000 JDW Performed at [facility], [place] [address], [place], [place] [address]      Radiology Studies: No results found.   Time spent: 35 minutes spent on chart review, discussion with nursing staff, consultants, updating family and interview/physical exam; more than 50% of that time was spent in counseling and/or coordination of care.  Terek Bee [place] DO [facility]  Between 7 am - 7 pm I am available, please contact me via Amion or Securechat  Between 7 pm - 7 am I am not available, please contact night coverage MD/APP via [employer]

## 2019-10-06 NOTE — Progress Notes (Signed)
Pt's HR is consistently elevated. MDs aware. Pt on tele. Not an acute change. Pain meds given as ordered. Pt in no distress.

## 2019-10-06 NOTE — Progress Notes (Signed)
Neurosurgery Service Progress Note  Subjective: No acute events overnight, still having pain, but she looks overall less toxic and more comfortable, she agrees that she is starting to feel better  Objective: Vitals:   10/05/19 1226 10/05/19 1716 10/05/19 2049 10/06/19 0408  BP: (!) 114/92 (!) 119/91 120/84 97/67  Pulse: (!) 118 (!) 122 (!) 107 100  Resp: 18 17 18 18   Temp: 98.4 F (36.9 C) 98.6 F (37 C) 98.5 F (36.9 C) 98.6 F (37 C)  TempSrc: Oral Oral Oral Oral  SpO2: 100% 100% 100% 99%  Weight:      Height:       Temp (24hrs), Avg:98.5 F (36.9 C), Min:98.4 F (36.9 C), Max:98.6 F (37 C)  CBC Latest Ref Rng & Units 10/05/2019 10/03/2019 10/02/2019  WBC 4.0 - 10.5 K/uL 11.2(H) 9.8 10.4  Hemoglobin 12.0 - 15.0 g/dL 10/04/2019 27.7 11.3(L)  Hematocrit 36.0 - 46.0 % 40.9 36.6 35.1(L)  Platelets 150 - 400 K/uL 639(H) 446(H) 401(H)   BMP Latest Ref Rng & Units 10/05/2019 10/04/2019 10/03/2019  Glucose 70 - 99 mg/dL 99 - 10/05/2019)  BUN 6 - 20 mg/dL 8 - 5(L)  Creatinine 878(M - 1.00 mg/dL 7.67 2.09 4.70  Sodium 135 - 145 mmol/L 135 - 133(L)  Potassium 3.5 - 5.1 mmol/L 4.8 - 3.3(L)  Chloride 98 - 111 mmol/L 100 - 98  CO2 22 - 32 mmol/L 25 - 24  Calcium 8.9 - 10.3 mg/dL 9.3 - 8.6(L)   No intake or output data in the 24 hours ending 10/06/19 0711  Current Facility-Administered Medications:  .  0.9 %  sodium chloride infusion, 250 mL, Intravenous, Continuous, Rylin Seavey A, MD, Last Rate: 1 mL/hr at 09/30/19 0126, 250 mL at 09/30/19 0126 .  acetaminophen (TYLENOL) tablet 650 mg, 650 mg, Oral, Q4H PRN, 650 mg at 10/03/19 1430 **OR** acetaminophen (TYLENOL) suppository 650 mg, 650 mg, Rectal, Q4H PRN, 10/05/19, MD .  Chlorhexidine Gluconate Cloth 2 % PADS 6 each, 6 each, Topical, Q0600, Jadene Pierini, MD, 6 each at 10/06/19 [phone] .  cyclobenzaprine (FLEXERIL) tablet 10 mg, 10 mg, Oral, TID PRN, 8366, MD, 10 mg at 10/05/19 2219 .  [START ON 07/30/2020]  docusate sodium (COLACE) capsule 100 mg, 100 mg, Oral, BID, Atina Feeley A, MD .  feeding supplement (ENSURE ENLIVE) (ENSURE ENLIVE) liquid 237 mL, 237 mL, Oral, BID BM, Gabrille Kilbride A, MD, 237 mL at 10/05/19 1433 .  heparin injection 5,000 Units, 5,000 Units, Subcutaneous, Q8H, 10/07/19, MD, 5,000 Units at 10/06/19 0617 .  HYDROmorphone (DILAUDID) injection 1 mg, 1 mg, Intravenous, Q4H PRN, 10/08/19, MD, 1 mg at 10/06/19 0333 .  lidocaine (LIDODERM) 5 % 1 patch, 1 patch, Transdermal, Daily, Vann, Jessica U, DO .  LORazepam (ATIVAN) tablet 0.5 mg, 0.5 mg, Oral, Once, [place], Wylder S, PA .  menthol-cetylpyridinium (CEPACOL) lozenge 3 mg, 1 lozenge, Oral, PRN **OR** phenol (CHLORASEPTIC) mouth spray 1 spray, 1 spray, Mouth/Throat, PRN, Cherokee Boccio, 02-22-2000, MD .  multivitamin with minerals tablet 1 tablet, 1 tablet, Oral, Daily, Clovis Pu, MD, 1 tablet at 10/05/19 0856 .  mupirocin ointment (BACTROBAN) 2 %, , Nasal, BID, 10/07/19, DO, Given at 10/05/19 2224 .  nicotine (NICODERM CQ - dosed in mg/24 hours) patch 21 mg, 21 mg, Transdermal, Daily, 2225, Rondell A, MD, 21 mg at 10/05/19 0856 .  ondansetron (ZOFRAN) tablet 4 mg, 4 mg, Oral, Q6H PRN **OR** ondansetron (ZOFRAN) injection  4 mg, 4 mg, Intravenous, Q6H PRN, Judith Part, MD .  oxyCODONE (Oxy IR/ROXICODONE) immediate release tablet 15 mg, 15 mg, Oral, Q4H PRN, Caren Griffins, MD, 15 mg at 10/06/19 0616 .  polyethylene glycol (MIRALAX / GLYCOLAX) packet 17 g, 17 g, Oral, Daily PRN, Dashonda Bonneau A, MD .  rifampin (RIFADIN) capsule 300 mg, 300 mg, Oral, Q12H, Michel Bickers, MD, 300 mg at 10/05/19 2219 .  sodium chloride flush (NS) 0.9 % injection 3 mL, 3 mL, Intravenous, Q12H, Kayleah Appleyard, Joyice Faster, MD, 3 mL at 10/05/19 2231 .  sodium chloride flush (NS) 0.9 % injection 3 mL, 3 mL, Intravenous, PRN, Judith Part, MD .  vancomycin (VANCOCIN) IVPB 750 mg/150 ml premix, 750  mg, Intravenous, Q8H, [name], [facility], Last Rate: 150 mL/hr at 10/06/19 0618, 750 mg at 10/06/19 2947   Physical Exam: AOx3, strength grossly 5/5x4 but pain limited proximally in BUE, mild fingertip numbness bilaterally Incision c/d/i  Assessment & Plan: 39 y.o. woman w/ polysubstance abuse, severe neck pain, cervical epidural abscess, 1/12 s/p C5-6 ACDF for abscess evacuation. Gram stain w/ GPCs in clusters. Echo w/ no vegetations  -can wear a soft collar for comfort, may help with neck pain, can wear prn -PT/OT rec SNF, pt is homeless, SW recs for placement, from my perspective, ready for transfer from SNF when able -SCDs/TEDs, SQH   Judith Part  10/06/19 7:11 AM

## 2019-10-06 NOTE — Progress Notes (Signed)
Occupational Therapy Treatment Patient Details Name: Colleen Ramirez MRN: [id number] DOB: 1981/03/22 Today's Date: 10/06/2019    History of present illness Colleen Ramirez is a 39 y.o. F admitted to the ED with c/o severe neck pain secondary to a cervical epidural abscess. She is s/p C5-6 anterior cervical discectomy and instrumented fusion. PMH: IVDU, anemia, anxiety, depression, recent admission for facial cellulitis.    OT comments  Pt progressing towards OT goals. Pt completing room level mobility without AD, multiple seated/standing ADL during session at overall minguard-supervision level throughout. Pt requiring cues intermittently throughout session to ensure carryover of cervical precautions. Pt reports notable numbness/tingling and decreased fine motor strength/coordination in bil hands (LUE>RUE, more so in radial nerve aspect of hands). Educated in general activities to work on improving dexterity and strength, will follow up to provide further HEP in following sessions. HR fluctuating 112-128 with activity. Will continue per POC at this time.   Follow Up Recommendations  SNF;Supervision/Assistance - 24 hour    Equipment Recommendations  None recommended by OT          Precautions / Restrictions Precautions Precautions: Fall;Cervical Precaution Booklet Issued: Yes (comment) Restrictions Weight Bearing Restrictions: No       Mobility Bed Mobility Overal bed mobility: Modified Independent                Transfers Overall transfer level: Needs assistance Equipment used: None Transfers: Sit to/from Stand Sit to Stand: Supervision         General transfer comment: supervision for lines    Balance Overall balance assessment: Mild deficits observed, not formally tested                                         ADL either performed or assessed with clinical judgement   ADL Overall ADL's : Needs assistance/impaired     Grooming: Set up;Min  guard;Sitting;Standing Grooming Details (indicate cue type and reason): min cues/education for use of compensatory strategies during ADL task given cervical precautions  Upper Body Bathing: Set up;Sitting Upper Body Bathing Details (indicate cue type and reason): seated on BSC at sink in bathroom Lower Body Bathing: Supervison/ safety;Sit to/from stand;Sitting/lateral leans Lower Body Bathing Details (indicate cue type and reason): min cues for safety/precautions Upper Body Dressing : Set up;Sitting   Lower Body Dressing: Supervision/safety;Sit to/from stand Lower Body Dressing Details (indicate cue type and reason): pt able to utilize figure 4 for donning socks and mesh underwear Toilet Transfer: Supervision/safety;Ambulation   Toileting- Clothing Manipulation and Hygiene: Supervision/safety;Sit to/from stand;Sitting/lateral lean       Functional mobility during ADLs: Supervision/safety General ADL Comments: intermittent cues required throughout functional tasks for maintaining cervical precautions, pt requiring increased time for ADL given numbness/decreased coordination and fine motor strength in bil hands     Vision       Perception     Praxis      Cognition Arousal/Alertness: Awake/alert Behavior During Therapy: WFL for tasks assessed/performed Overall Cognitive Status: Within Functional Limits for tasks assessed                                          Exercises Exercises: Other exercises Other Exercises Other Exercises: instructed in general fine motor activities to increased strength and dexterity in bil hands   Shoulder  Instructions       General Comments HR 112-128 with activity    Pertinent Vitals/ Pain       Pain Assessment: Faces Faces Pain Scale: Hurts little more Pain Location: neck Pain Descriptors / Indicators: Aching;Discomfort;Grimacing;Guarding;Shooting;Sharp Pain Intervention(s): Limited activity within patient's  tolerance;Monitored during session;Repositioned  Home Living                                          Prior Functioning/Environment              Frequency  Min 2X/week        Progress Toward Goals  OT Goals(current goals can now be found in the care plan section)  Progress towards OT goals: Progressing toward goals  Acute Rehab OT Goals Patient Stated Goal: decrease pain OT Goal Formulation: With patient Time For Goal Achievement: 10/14/19 Potential to Achieve Goals: Good  Plan Discharge plan remains appropriate    Co-evaluation                 AM-PAC OT "6 Clicks" Daily Activity     Outcome Measure   Help from another person eating meals?: None Help from another person taking care of personal grooming?: None Help from another person toileting, which includes using toliet, bedpan, or urinal?: None Help from another person bathing (including washing, rinsing, drying)?: A Little Help from another person to put on and taking off regular upper body clothing?: None Help from another person to put on and taking off regular lower body clothing?: A Little 6 Click Score: 22    End of Session    OT Visit Diagnosis: Unsteadiness on feet (R26.81);Muscle weakness (generalized) (M62.81);Pain Pain - part of body: (neck)   Activity Tolerance Patient tolerated treatment well   Patient Left in bed;with call bell/phone within reach   Nurse Communication Mobility status        Time: 4097-3532 OT Time Calculation (min): 51 min  Charges: OT General Charges $OT Visit: 1 Visit OT Treatments $Self Care/Home Management : 38-52 mins  Marcy Siren, OT Supplemental Rehabilitation Services Pager [phone] Office [phone]    Orlando Penner 10/06/2019, 5:19 PM

## 2019-10-06 NOTE — Progress Notes (Signed)
Pharmacy Antibiotic Note  Colleen Ramirez is a 39 y.o. female admitted on 09/29/2019 with MRSA cervical spine infection.  Pharmacy has been consulted for Vancomycin dosing.  The patient had two Vancomycin levels drawn at steady state: Peak level 32 mcg/mL and trough level 11 mcg/mL. Calculated AUC is within goal at 521.5. Will continue current dose and continue to monitor renal function and clinical progression.  Plan: - Continue Vancomycin 750 mg IV every 8 hours - Will plan to repeat vancomycin levels on 1/22 - ID planning 6 weeks of treatment  Height: 5\' 3"  (160 cm) Weight: 110 lb (49.9 kg) IBW/kg (Calculated) : 52.4  Temp (24hrs), Avg:98.4 F (36.9 C), Min:98 F (36.7 C), Max:98.6 F (37 C)  Recent Labs  Lab 10/01/19 0445 10/01/19 0445 10/01/19 2322 10/02/19 0342 10/03/19 0313 10/03/19 2053 10/04/19 1206 10/05/19 0546 10/06/19 0752 10/06/19 1452  WBC 14.9*  --   --  10.4 9.8  --   --  11.2* 11.8*  --   CREATININE 0.46   < >  --  0.43* 0.52  --  0.48 0.52 0.60  --   LATICACIDVEN  --   --  1.6  --   --   --   --   --   --   --   VANCOTROUGH  --   --   --   --   --   --  <4*  --   --  11*  VANCOPEAK  --   --   --   --   --  21*  --   --  32  --    < > = values in this interval not displayed.    Estimated Creatinine Clearance: 74.4 mL/min (by C-G formula based on SCr of 0.6 mg/dL).    No Known Allergies  Antimicrobials this admission: Zosyn 1/12 >> 1/13 Vanc 1/12 >> Rifampin 1/14 >>  Dose adjustments this admission: 1/16 & 1/17 levels: 21 and <4 - 1g x 1 and adj to 750 mg/8h  Microbiology results: 1/12 abscess cx - GPCs in clusters - MRSA  3/12, PharmD PGY1 Pharmacy Resident Phone: [phone] 10/06/2019  3:51 PM  Please check [email or web address] for unit-specific pharmacy phone numbers.

## 2019-10-06 NOTE — Plan of Care (Signed)
  Problem: Activity: Goal: Risk for activity intolerance will decrease Outcome: Progressing   Problem: Nutrition: Goal: Adequate nutrition will be maintained Outcome: Progressing   Problem: Safety: Goal: Ability to remain free from injury will improve Outcome: Progressing   

## 2019-10-06 NOTE — Progress Notes (Signed)
Nutrition Follow-up  DOCUMENTATION CODES:   Not applicable  INTERVENTION:   -Continue MVI with minerals daily -Continue Ensure Enlive po BID, each supplement provides 350 kcal and 20 grams of protein  NUTRITION DIAGNOSIS:   Increased nutrient needs related to post-op healing as evidenced by estimated needs.  Ongoing  GOAL:   Patient will meet greater than or equal to 90% of their needs  Progressing   MONITOR:   PO intake, Supplement acceptance, Labs, Weight trends, Skin, I & O's  REASON FOR ASSESSMENT:   Malnutrition Screening Tool    ASSESSMENT:   This is a 39 y.o. woman that presents with severe neck pain in the setting of IVDU w/ methamphetamine. She also has a h/o anemia, anxiety, and depression, recent admission for facial cellulitis. She has had 2 days of severe bilateral neck / interscapular and bilateral arm pain. She is currently in severe pain and is not a detailed historian. She does endorse that the pain is sharp in quality, severe in intensity, worse with movement but present at rest. She denies any new weakness, but has pain limited strength in the BUE. She does endorse some bilateral hand numbness but cannot tell which fingers. No recent change in bowel or bladder function. No recent use of anti-platelet or anti-coagulant medications.  1/12- s/p PROCEDURE:C5-C6Anterior Cervical Discectomy and Instrumented Fusion  Reviewed I/O's: +6.4 L since admission  Pt lying in bed, sleeping at time of visit. She did not respond to voice.   Pt remains with good appetite; noted meal completion 100%. Pt is consuming Ensure supplements per MAR.   Pt will remain in the hospital for completion of IV antibiotics.   Labs reviewed.   Diet Order:   Diet Order            Diet regular Room service appropriate? Yes; Fluid consistency: Thin  Diet effective now              EDUCATION NEEDS:   No education needs have been identified at this time  Skin:  Skin  Assessment: Skin Integrity Issues: Skin Integrity Issues:: Incisions Incisions: closed neck  Last BM:  10/05/19  Height:   Ht Readings from Last 1 Encounters:  09/29/19 5\' 3"  (1.6 m)    Weight:   Wt Readings from Last 1 Encounters:  09/29/19 49.9 kg    Ideal Body Weight:  52.2 kg  BMI:  Body mass index is 19.49 kg/m.  Estimated Nutritional Needs:   Kcal:  1750-1950  Protein:  85-100 grams  Fluid:  > 1.7 L    Dmoni Fortson A. 11/27/19, RD, LDN, CDCES Registered Dietitian II Certified Diabetes Care and Education Specialist Pager: [phone] After hours Pager: [phone]

## 2019-10-07 DIAGNOSIS — A4102 Sepsis due to Methicillin resistant Staphylococcus aureus: Principal | ICD-10-CM

## 2019-10-07 DIAGNOSIS — R768 Other specified abnormal immunological findings in serum: Secondary | ICD-10-CM

## 2019-10-07 NOTE — Progress Notes (Signed)
Neurosurgery Service Progress Note  Subjective: No acute events overnight, shoulder / posterior neck pain worse today, no dysphagia / odynophagia / dysphonia  Objective: Vitals:   10/06/19 0733 10/06/19 1220 10/07/19 0023 10/07/19 0517  BP: 114/90 112/81 (!) 113/102 117/87  Pulse: (!) 116 (!) 121 (!) 110 98  Resp: 17 20 16 16   Temp: 98 F (36.7 C) 98.1 F (36.7 C) 97.9 F (36.6 C) 98.5 F (36.9 C)  TempSrc: Oral Oral Oral Oral  SpO2: 100% 100% 99% 99%  Weight:      Height:       Temp (24hrs), Avg:98.2 F (36.8 C), Min:97.9 F (36.6 C), Max:98.5 F (36.9 C)  CBC Latest Ref Rng & Units 10/06/2019 10/05/2019 10/03/2019  WBC 4.0 - 10.5 K/uL 11.8(H) 11.2(H) 9.8  Hemoglobin 12.0 - 15.0 g/dL 12.5 13.2 12.1  Hematocrit 36.0 - 46.0 % 39.0 40.9 36.6  Platelets 150 - 400 K/uL 674(H) 639(H) 446(H)   BMP Latest Ref Rng & Units 10/06/2019 10/05/2019 10/04/2019  Glucose 70 - 99 mg/dL 147(H) 99 -  BUN 6 - 20 mg/dL 8 8 -  Creatinine 0.44 - 1.00 mg/dL 0.60 0.52 0.48  Sodium 135 - 145 mmol/L 135 135 -  Potassium 3.5 - 5.1 mmol/L 3.8 4.8 -  Chloride 98 - 111 mmol/L 98 100 -  CO2 22 - 32 mmol/L 24 25 -  Calcium 8.9 - 10.3 mg/dL 8.9 9.3 -   No intake or output data in the 24 hours ending 10/07/19 0839  Current Facility-Administered Medications:  .  0.9 %  sodium chloride infusion, 250 mL, Intravenous, Continuous, Akiba Melfi A, MD, Last Rate: 1 mL/hr at 09/30/19 0126, 250 mL at 09/30/19 0126 .  acetaminophen (TYLENOL) tablet 650 mg, 650 mg, Oral, Q4H PRN, 650 mg at 10/03/19 1430 **OR** acetaminophen (TYLENOL) suppository 650 mg, 650 mg, Rectal, Q4H PRN, Judith Part, MD .  Chlorhexidine Gluconate Cloth 2 % PADS 6 each, 6 each, Topical, Q0600, Caren Griffins, MD, 6 each at 10/07/19 0525 .  cyclobenzaprine (FLEXERIL) tablet 10 mg, 10 mg, Oral, TID PRN, Judith Part, MD, 10 mg at 10/07/19 0524 .  [START ON 07/30/2020] docusate sodium (COLACE) capsule 100 mg, 100 mg, Oral,  BID, Jadee Golebiewski A, MD .  feeding supplement (ENSURE ENLIVE) (ENSURE ENLIVE) liquid 237 mL, 237 mL, Oral, BID BM, Arriyana Rodell A, MD, 237 mL at 10/06/19 1336 .  heparin injection 5,000 Units, 5,000 Units, Subcutaneous, Q8H, Judith Part, MD, 5,000 Units at 10/06/19 0617 .  HYDROmorphone (DILAUDID) injection 1 mg, 1 mg, Intravenous, Q4H PRN, Vashti Hey, MD, 1 mg at 10/07/19 0702 .  lidocaine (LIDODERM) 5 % 1 patch, 1 patch, Transdermal, Daily, Vann, Jessica U, DO .  menthol-cetylpyridinium (CEPACOL) lozenge 3 mg, 1 lozenge, Oral, PRN **OR** phenol (CHLORASEPTIC) mouth spray 1 spray, 1 spray, Mouth/Throat, PRN, Azoria Abbett, Joyice Faster, MD .  multivitamin with minerals tablet 1 tablet, 1 tablet, Oral, Daily, Judith Part, MD, 1 tablet at 10/06/19 0949 .  mupirocin ointment (BACTROBAN) 2 %, , Nasal, BID, Geradine Girt, DO, Given at 10/06/19 2200 .  nicotine (NICODERM CQ - dosed in mg/24 hours) patch 21 mg, 21 mg, Transdermal, Daily, Tamala Julian, Rondell A, MD, 21 mg at 10/06/19 0950 .  ondansetron (ZOFRAN) tablet 4 mg, 4 mg, Oral, Q6H PRN **OR** ondansetron (ZOFRAN) injection 4 mg, 4 mg, Intravenous, Q6H PRN, Rakisha Pincock A, MD .  oxyCODONE (Oxy IR/ROXICODONE) immediate release tablet 15 mg, 15 mg, Oral,  Q4H PRN, Leatha Gilding, MD, 15 mg at 10/07/19 0524 .  polyethylene glycol (MIRALAX / GLYCOLAX) packet 17 g, 17 g, Oral, Daily PRN, Nikolette Reindl A, MD .  rifampin (RIFADIN) capsule 300 mg, 300 mg, Oral, Q12H, Cliffton Asters, MD, 300 mg at 10/06/19 2158 .  sodium chloride flush (NS) 0.9 % injection 3 mL, 3 mL, Intravenous, Q12H, Poet Hineman, Clovis Pu, MD, 3 mL at 10/06/19 0953 .  sodium chloride flush (NS) 0.9 % injection 3 mL, 3 mL, Intravenous, PRN, Jadene Pierini, MD .  vancomycin (VANCOCIN) IVPB 750 mg/150 ml premix, 750 mg, Intravenous, Q8H, Ann Held, [facility], Last Rate: 150 mL/hr at 10/07/19 0522, 750 mg at 10/07/19 0522   Physical  Exam: AOx3, strength grossly 5/5x4 but pain limited proximally in BUE, mild fingertip numbness bilaterally Incision c/d/i, some lateral swelling underneath the incision, neck otherwise soft, normal voice tone, able to swallow saliva without issue  Assessment & Plan: 39 y.o. woman w/ polysubstance abuse, severe neck pain, cervical epidural abscess, 1/12 s/p C5-6 ACDF for abscess evacuation. Gram stain w/ GPCs in clusters. Echo w/ no vegetations  -some swelling under the incision today, appears asymptomatic. She is very thin so it is easy to feel her anatomy, it is lateral to the midline structures and is not compressing larynx / esophagus, no concerning complaints this morning, no intervention needed, reassured her that it should resolve without intervention -can wear a soft collar for comfort, may help with neck pain, can wear prn -PT/OT rec SNF, pt is homeless, SW recs for placement, from my perspective, ready for transfer from SNF when able -SCDs/TEDs, SQH   Jadene Pierini  10/07/19 8:39 AM

## 2019-10-07 NOTE — Progress Notes (Signed)
Occupational Therapy Treatment Patient Details Name: Colleen Ramirez MRN: [id number] DOB: 1980-12-27 Today's Date: 10/07/2019    History of present illness Colleen Ramirez is a 39 y.o. F admitted to the ED with c/o severe neck pain secondary to a cervical epidural abscess. She is s/p C5-6 anterior cervical discectomy and instrumented fusion. PMH: IVDU, anemia, anxiety, depression, recent admission for facial cellulitis.    OT comments  Pt making minimal progress towards OT goals this session with pt reporting increased pain declining OOB mobility. Pt agreeable to supine [place] therex to facilitate increased AROM for ADL participation. Pt completed therex as listed below and issued pt HEP to maximize carryover. DC Plan remains appropriate, will follow acutely per POC.    Follow Up Recommendations  SNF;Supervision/Assistance - 24 hour    Equipment Recommendations  None recommended by OT    Recommendations for Other Services      Precautions / Restrictions Precautions Precautions: Fall;Cervical Precaution Booklet Issued: Yes (comment) Restrictions Weight Bearing Restrictions: No       Mobility Bed Mobility               General bed mobility comments: declined OOB mobility  Transfers                 General transfer comment: declined transfer training    Balance Overall balance assessment: Mild deficits observed, not formally tested                                         ADL either performed or assessed with clinical judgement   ADL                                         General ADL Comments: declined ADL participation d/t pain     Vision Baseline Vision/History: No visual deficits     Perception     Praxis      Cognition Arousal/Alertness: Awake/alert Behavior During Therapy: WFL for tasks assessed/performed;Restless Overall Cognitive Status: Within Functional Limits for tasks assessed                                  General Comments: pt with increased pain this session noted to become restless with activity        Exercises Hand Exercises Digit Composite Flexion: AROM;Both;10 reps;Supine Composite Extension: AROM;Both;10 reps;Supine Digit Composite Abduction: AROM;Both;10 reps;Supine Digit Composite Adduction: AROM;Both;10 reps;Supine Digit Lifts: AROM;Both;10 reps;Supine Opposition: AROM;Both;10 reps;Supine Other Exercises Other Exercises: issued [place] HEP and visually demo'ed all therex   Shoulder Instructions       General Comments      Pertinent Vitals/ Pain       Pain Assessment: Faces Faces Pain Scale: Hurts whole lot Pain Location: neck Pain Descriptors / Indicators: Aching;Discomfort;Grimacing;Guarding;Shooting;Sharp Pain Intervention(s): Limited activity within patient's tolerance;Monitored during session;RN gave pain meds during session  Home Living                                          Prior Functioning/Environment              Frequency  Min 2X/week  Progress Toward Goals  OT Goals(current goals can now be found in the care plan section)  Progress towards OT goals: Not progressing toward goals - comment(limited by increased pain declining functional mobility)  Acute Rehab OT Goals Patient Stated Goal: decrease pain OT Goal Formulation: With patient Time For Goal Achievement: 10/14/19 Potential to Achieve Goals: Good  Plan Discharge plan remains appropriate    Co-evaluation                 AM-PAC OT "6 Clicks" Daily Activity     Outcome Measure   Help from another person eating meals?: None Help from another person taking care of personal grooming?: None Help from another person toileting, which includes using toliet, bedpan, or urinal?: None Help from another person bathing (including washing, rinsing, drying)?: A Little Help from another person to put on and taking off regular upper body clothing?:  None Help from another person to put on and taking off regular lower body clothing?: A Little 6 Click Score: 22    End of Session    OT Visit Diagnosis: Unsteadiness on feet (R26.81);Muscle weakness (generalized) (M62.81);Pain   Activity Tolerance Patient limited by pain   Patient Left in bed;with call bell/phone within reach   Nurse Communication Mobility status;Other (comment)(requesting to speak with RN)        Time: 4680-3212 OT Time Calculation (min): 10 min  Charges: OT General Charges $OT Visit: 1 Visit OT Treatments $Therapeutic Exercise: 8-22 mins  Audery Amel., COTA/L Acute Rehabilitation Services [phone]    Angelina Pih 10/07/2019, 3:52 PM

## 2019-10-07 NOTE — Progress Notes (Signed)
Physical Therapy Treatment Patient Details Name: Colleen Ramirez MRN: [id number] DOB: 13-Sep-1981 Today's Date: 10/07/2019    History of Present Illness Colleen Ramirez is a 39 y.o. F admitted to the ED with c/o severe neck pain secondary to a cervical epidural abscess. She is s/p C5-6 anterior cervical discectomy and instrumented fusion. PMH: IVDU, anemia, anxiety, depression, recent admission for facial cellulitis.     PT Comments    Pt in bed and restless due to pain with positioning upon PT arrival, initially declining PT session, but agreeable to assistance for general mobility/repositioning. The pt continues to present with limitations in ROM and sig pain due to above dx, but was able to demo sig improvements in standing balance and independence with ambulation in the room today. The pt continues to benefit from supervision and verbal cues for log roll technique for bed mobility, but was educated thoroughly and was agreeable to education at this time. The pt will continue to benefit from skilled therapy to maximize stability, mobility, and understanding of precautions prior to d/c.     Follow Up Recommendations  SNF     Equipment Recommendations  Other (comment)(TBD next venue)    Recommendations for Other Services       Precautions / Restrictions Precautions Precautions: Fall;Cervical Precaution Booklet Issued: Yes (comment) Restrictions Weight Bearing Restrictions: No    Mobility  Bed Mobility Overal bed mobility: Modified Independent Bed Mobility: Rolling;Sidelying to Sit Rolling: Supervision Sidelying to sit: Supervision       General bed mobility comments: pt able to complete transition to sitting EOB without assist or use of rails, benefits from VCs for log roll technique. States she has not been informed of log roll in past despite prior documentation of instruction  Transfers Overall transfer level: Needs assistance Equipment used: None Transfers: Sit to/from  Stand Sit to Stand: Modified independent (Device/Increase time)         General transfer comment: pt was able to stand without use of device, good stability upon initial stand, able to lean down to get tele box without assist  Ambulation/Gait Ambulation/Gait assistance: Modified independent (Device/Increase time) Gait Distance (Feet): 20 Feet Assistive device: None Gait Pattern/deviations: Step-through pattern;Decreased stride length Gait velocity: decreased Gait velocity interpretation: 1.31 - 2.62 ft/sec, indicative of limited community ambulator General Gait Details: Pt stood and ambulated to the other side of the room of her own volition, no LOB or instability noted, pt declined further mobility at this time   Optometrist    Modified Rankin (Stroke Patients Only)       Balance Overall balance assessment: Mild deficits observed, not formally tested                                          Cognition Arousal/Alertness: Awake/alert Behavior During Therapy: [facility] for tasks assessed/performed;Restless Overall Cognitive Status: Within Functional Limits for tasks assessed                                 General Comments: pt reporting sig pain in her neck that limits full therapy session, became very restless and uncomfortable regarding positioning in bed      Exercises      General Comments        Pertinent  Vitals/Pain Pain Assessment: Faces Faces Pain Scale: Hurts little more Pain Location: neck Pain Descriptors / Indicators: Aching;Discomfort;Grimacing;Guarding;Shooting;Sharp Pain Intervention(s): Monitored during session;Limited activity within patient's tolerance;Premedicated before session;Repositioned    Home Living                      Prior Function            PT Goals (current goals can now be found in the care plan section) Acute Rehab PT Goals Patient Stated Goal: decrease  pain PT Goal Formulation: With patient Time For Goal Achievement: 10/14/19 Potential to Achieve Goals: Good Progress towards PT goals: Progressing toward goals    Frequency    Min 2X/week      PT Plan Current plan remains appropriate    Co-evaluation              AM-PAC PT "6 Clicks" Mobility   Outcome Measure  Help needed turning from your back to your side while in a flat bed without using bedrails?: A Little Help needed moving from lying on your back to sitting on the side of a flat bed without using bedrails?: A Little Help needed moving to and from a bed to a chair (including a wheelchair)?: None Help needed standing up from a chair using your arms (e.g., wheelchair or bedside chair)?: None Help needed to walk in hospital room?: None Help needed climbing 3-5 steps with a railing? : A Little 6 Click Score: 21    End of Session Equipment Utilized During Treatment: Other (comment)(none) Activity Tolerance: Patient tolerated treatment well;Patient limited by pain Patient left: in bed;with call bell/phone within reach(sidelying) Nurse Communication: Mobility status PT Visit Diagnosis: Unsteadiness on feet (R26.81);Other abnormalities of gait and mobility (R26.89);Muscle weakness (generalized) (M62.81);Pain Pain - Right/Left: Right(central) Pain - part of body: (neck)     Time: 1005-1016 PT Time Calculation (min) (ACUTE ONLY): 11 min  Charges:  $Therapeutic Activity: 8-22 mins                     Karma Ganja, PT, DPT   Acute Rehabilitation Department Pager #: [phone]   Otho Bellows 10/07/2019, 12:26 PM

## 2019-10-07 NOTE — Progress Notes (Signed)
PROGRESS NOTE  Colleen Ramirez DQQ:[id number] DOB: [birth year] DOA: 09/29/2019 PCP: Patient, No Pcp Per  Brief Narrative / Interim history: Colleen Solitariois an 39 y.o.femaleIV drug use (methamphetamine), anemia, anxiety, depression,s/pgastric bypass, and homelessness, whoinitiallypresented witha 2-day history ofseveresharpneck pain.Pain was constant,but worse with any kind of movement. Admits to last using"Ice"for crystal meth approximately 2 days PTA. Patient injects intoher arms. Just recently hospitalized from12/1/20-12/8/20withfacial cellulitis. She was doxycycline100 mg twice daily to complete a 10-day course and clindamycin external solution, but it appears not to have completed this.  Patient underwent ACDF by neurosurgery on 09/29/2019.  Intraoperative cultures with MRSA Continues on IV antibiotics with rifampin/vancomycin; will likely remain inpatient for duration of therapy given active IVDU.  Continues to persistently requests additional pain medication, complaining of persistent pain.   HPI/Recap of past 24 hours:  C/o right side neck pain, c/o pain meds is not helping, denies pain with swallowing, denies difficulty breathing   Assessment/Plan: Principal Problem:   Infection of cervical spine (HCC) Active Problems:   Sepsis ([place])   Depression   Acquired fusion of cervical spine   IVDU (intravenous drug user)   Homeless   S/P cervical discectomy   Anxiety   Status post gastric bypass for obesity   Hepatitis C antibody positive in blood   Staph aureus infection  Sepsis due to cervical epidural abscess, present on admission Patient presented with tachycardia, elevated WBC count.  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  Patient underwent ACDF by neurosurgery on 09/29/2019.  Intraoperative cultures with MRSA. --Infectious disease, neurosurgery  following, appreciate assistance --Continue vancomycin/rifampin per ID; plan 6-week course from surgery 09/29/2019 --Not a candidate for outpatient IV antibiotics with PICC line 2/2 current IVDU --Continue pain control, she tends to be oversedated at times; will continue pain management current levels  -Neck swalling , consider soft tissue US if does not get better, will follow neurosurgery recommendation -She did require Decadron x1.  For dysphagia , currently she denies dysphagia  Polysubstance abuse Patient reports mostly methamphetamine use in which he injects into her arms.  Not a candidate for PICC line as above. mostly meth per patient --TOC consult  HCV positive --Per infectious disease  DVT Prophylaxis: Subcu heparin ordered, she refused it intermittently  Code Status: Full  Family Communication: patient   Disposition Plan: Need skilled nursing facility, need long-term IV antibiotic therapy, history of polysubstance abuse, homeless per report difficult placement   Consultants:  Neurosurgery  Infectious disease  Social worker  PT /OT  Procedures: ACDF by neurosurgery on 09/29/2019.  Antibiotics:  IV vanc   Objective: BP 113/90 (BP Location: Left Arm)   Pulse (!) 106   Temp 97.9 F (36.6 C) (Oral)   Resp 17   Ht 5\' 3"  (1.6 m)   Wt 49.9 kg   SpO2 100%   BMI 19.49 kg/m  No intake or output data in the 24 hours ending 10/07/19 1111 Filed Weights   09/29/19 2032  Weight: 49.9 kg    Exam: Patient is examined daily including today on 10/07/2019, exams remain the same as of yesterday except that has changed    General:  NAD, right neck postop changes with associated edema  Cardiovascular: tachycardia  Respiratory: CTABL  Abdomen: Soft/ND/NT, positive BS  Musculoskeletal: No Edema  Neuro: alert, oriented   Data Reviewed: Basic Metabolic Panel: Recent Labs  Lab 10/01/19 0445 10/01/19 0445 10/02/19 0342 10/03/19 0313 10/04/19 1206  10/05/19 0546 10/06/19  0752  NA 135  --  137 133*  --  135 135  K 4.1  --  3.6 3.3*  --  4.8 3.8  CL 101  --  101 98  --  100 98  CO2 23  --  27 24  --  25 24  GLUCOSE 98  --  102* 156*  --  99 147*  BUN 7  --  5* 5*  --  8 8  CREATININE 0.46   < > 0.43* 0.52 0.48 0.52 0.60  CALCIUM 8.3*  --  8.7* 8.6*  --  9.3 8.9   < > = values in this interval not displayed.   Liver Function Tests: No results for input(s): AST, ALT, ALKPHOS, BILITOT, PROT, ALBUMIN in the last 168 hours. No results for input(s): LIPASE, AMYLASE in the last 168 hours. No results for input(s): AMMONIA in the last 168 hours. CBC: Recent Labs  Lab 10/01/19 0445 10/02/19 0342 10/03/19 0313 10/05/19 0546 10/06/19 0752  WBC 14.9* 10.4 9.8 11.2* 11.8*  NEUTROABS 11.5*  --   --   --   --   HGB 11.1* 11.3* 12.1 13.2 12.5  HCT 34.2* 35.1* 36.6 40.9 39.0  MCV 86.4 86.0 84.9 85.7 87.2  PLT 308 401* 446* 639* 674*   Cardiac Enzymes:   No results for input(s): CKTOTAL, CKMB, CKMBINDEX, TROPONINI in the last 168 hours. BNP (last 3 results) No results for input(s): BNP in the last 8760 hours.  ProBNP (last 3 results) No results for input(s): PROBNP in the last 8760 hours.  CBG: No results for input(s): GLUCAP in the last 168 hours.  Recent Results (from the past 240 hour(s))  SARS CORONAVIRUS 2 (TAT 6-24 HRS) Nasopharyngeal Nasopharyngeal Swab     Status: None   Collection Time: 09/29/19 11:38 AM   Specimen: Nasopharyngeal Swab  Result Value Ref Range Status   SARS Coronavirus 2 NEGATIVE NEGATIVE Final    Comment: (NOTE) SARS-CoV-2 target nucleic acids are NOT DETECTED. The SARS-CoV-2 RNA is generally detectable in upper and lower respiratory specimens during the acute phase of infection. Negative results do not preclude SARS-CoV-2 infection, do not rule out co-infections with other pathogens, and should not be used as the sole basis for treatment or other patient management decisions. Negative results must  be combined with clinical observations, patient history, and epidemiological information. The expected result is Negative. Fact Sheet for Patients: HairSlick.no Fact Sheet for Healthcare Providers: quierodirigir.com This test is not yet approved or cleared by the [place] FDA and  has been authorized for detection and/or diagnosis of SARS-CoV-2 by FDA under an Emergency Use Authorization (EUA). This EUA will remain  in effect (meaning this test can be used) for the duration of the COVID-19 declaration under Section 56 4(b)(1) of the Act, 21 U.S.C. section 360bbb-3(b)(1), unless the authorization is terminated or revoked sooner. Performed at [facility], [address] [place], [place] [address]   Aerobic/Anaerobic Culture (surgical/deep wound)     Status: None   Collection Time: 09/29/19  9:43 PM   Specimen: Abscess  Result Value Ref Range Status   Specimen Description ABSCESS  Final   Special Requests CERVICAL EPIDURAL  Final   Gram Stain   Final    RARE WBC PRESENT, PREDOMINANTLY PMN FEW GRAM POSITIVE COCCI IN CLUSTERS    Culture   Final    ABUNDANT METHICILLIN RESISTANT STAPHYLOCOCCUS AUREUS NO ANAEROBES ISOLATED Performed at [facility], [address],  [place], [place] [address]    Report Status 10/05/2019 FINAL  Final   Organism ID, Bacteria METHICILLIN RESISTANT STAPHYLOCOCCUS AUREUS  Final      Susceptibility   Methicillin resistant staphylococcus aureus - MIC*    CIPROFLOXACIN >=8 RESISTANT Resistant     ERYTHROMYCIN >=8 RESISTANT Resistant     GENTAMICIN <=0.5 SENSITIVE Sensitive     OXACILLIN >=4 RESISTANT Resistant     TETRACYCLINE <=1 SENSITIVE Sensitive     VANCOMYCIN <=0.5 SENSITIVE Sensitive     TRIMETH/SULFA 80 RESISTANT Resistant     CLINDAMYCIN >=8 RESISTANT Resistant     RIFAMPIN <=0.5 SENSITIVE Sensitive     Inducible Clindamycin NEGATIVE Sensitive     * ABUNDANT METHICILLIN  RESISTANT STAPHYLOCOCCUS AUREUS  MRSA PCR Screening     Status: Abnormal   Collection Time: 10/01/19  6:23 PM   Specimen: Nasal Mucosa; Nasopharyngeal  Result Value Ref Range Status   MRSA by PCR POSITIVE (A) NEGATIVE Final    Comment:        The GeneXpert MRSA Assay (FDA approved for NASAL specimens only), is one component of a comprehensive MRSA colonization surveillance program. It is not intended to diagnose MRSA infection nor to guide or monitor treatment for MRSA infections. RESULT CALLED TO, READ BACK BY AND VERIFIED WITH: A NEZIUS RN 10/01/19 2000 JDW Performed at [facility], [address] [place] [address] [place], [place] [address]      Studies: No results found.  Scheduled Meds: . Chlorhexidine Gluconate Cloth  6 each Topical Q0600  . [START ON 07/30/2020] docusate sodium  100 mg Oral BID  . feeding supplement (ENSURE ENLIVE)  237 mL Oral BID BM  . heparin injection (subcutaneous)  5,000 Units Subcutaneous Q8H  . lidocaine  1 patch Transdermal Daily  . multivitamin with minerals  1 tablet Oral Daily  . mupirocin ointment   Nasal BID  . nicotine  21 mg Transdermal Daily  . rifampin  300 mg Oral Q12H  . sodium chloride flush  3 mL Intravenous Q12H    Continuous Infusions: . sodium chloride 250 mL (09/30/19 0126)  . vancomycin 750 mg (10/07/19 0522)     Time spent: I have personally reviewed and interpreted on  10/07/2019 daily labs imagings as discussed above under date review session and assessment and plans.  I reviewed all nursing notes, pharmacy notes, consultant notes,  vitals, pertinent old records  I have discussed plan of care as described above with RN , patient  on 10/07/2019   Albertine Grates MD, PhD, FACP  [facility] Pager [phone]. If 7PM-7AM, please contact night-coverage at [email or web address], password [facility] 10/07/2019, 11:11 AM  LOS: 8 days

## 2019-10-07 NOTE — Plan of Care (Signed)
Pt's MEWS went to yellow d/t HR 124. Pt currently in pain, not experiencing any other sx. Roda Shutters, MD notified via pager, waiting to hear back. Will continue to monitor.   Problem: Education: Goal: Knowledge of General Education information will improve Description: Including pain rating scale, medication(s)/side effects and non-pharmacologic comfort measures Outcome: Progressing   Problem: Health Behavior/Discharge Planning: Goal: Ability to manage health-related needs will improve Outcome: Progressing   Problem: Clinical Measurements: Goal: Will remain free from infection Outcome: Progressing Goal: Cardiovascular complication will be avoided Outcome: Progressing   Problem: Coping: Goal: Level of anxiety will decrease Outcome: Progressing   Problem: Pain Managment: Goal: General experience of comfort will improve Outcome: Progressing   Problem: Safety: Goal: Ability to remain free from injury will improve Outcome: Progressing   Problem: Skin Integrity: Goal: Risk for impaired skin integrity will decrease Outcome: Progressing

## 2019-10-07 NOTE — Plan of Care (Signed)
  Problem: Education: Goal: Knowledge of General Education information will improve Description: Including pain rating scale, medication(s)/side effects and non-pharmacologic comfort measures 10/07/2019 1703 by Theo Dills, RN Outcome: Progressing 10/07/2019 1242 by Theo Dills, RN Outcome: Progressing   Problem: Health Behavior/Discharge Planning: Goal: Ability to manage health-related needs will improve Outcome: Progressing   Problem: Clinical Measurements: Goal: Will remain free from infection 10/07/2019 1703 by Theo Dills, RN Outcome: Progressing 10/07/2019 1242 by Theo Dills, RN Outcome: Progressing Goal: Cardiovascular complication will be avoided Outcome: Progressing   Problem: Activity: Goal: Risk for activity intolerance will decrease Outcome: Progressing   Problem: Coping: Goal: Level of anxiety will decrease 10/07/2019 1703 by Theo Dills, RN Outcome: Progressing 10/07/2019 1242 by Theo Dills, RN Outcome: Progressing   Problem: Pain Managment: Goal: General experience of comfort will improve 10/07/2019 1703 by Theo Dills, RN Outcome: Progressing 10/07/2019 1242 by Theo Dills, RN Outcome: Progressing   Problem: Safety: Goal: Ability to remain free from injury will improve 10/07/2019 1703 by Theo Dills, RN Outcome: Progressing 10/07/2019 1242 by Theo Dills, RN Outcome: Progressing   Problem: Skin Integrity: Goal: Risk for impaired skin integrity will decrease 10/07/2019 1703 by Theo Dills, RN Outcome: Progressing 10/07/2019 1242 by Theo Dills, RN Outcome: Progressing

## 2019-10-08 LAB — TSH: TSH: 1.813 u[IU]/mL (ref 0.350–4.500)

## 2019-10-08 MED ORDER — SENNOSIDES-DOCUSATE SODIUM 8.6-50 MG PO TABS
1.0000 | ORAL_TABLET | Freq: Every day | ORAL | Status: DC
  Administered 2019-10-08 – 2019-10-10 (×2): 1 via ORAL
  Filled 2019-10-08 (×3): qty 1

## 2019-10-08 NOTE — Plan of Care (Addendum)
  Problem: Skin Integrity: Goal: Risk for impaired skin integrity will decrease Outcome: Progressing   Problem: Pain Managment: Goal: General experience of comfort will improve Outcome: Progressing   Problem: Coping: Goal: Level of anxiety will decrease Outcome: Progressing   Problem: Clinical Measurements: Goal: Cardiovascular complication will be avoided Outcome: Progressing   Problem: Clinical Measurements: Goal: Will remain free from infection Outcome: Progressing   Problem: Clinical Measurements: Goal: Ability to maintain clinical measurements within normal limits will improve Outcome: Progressing   Call out Rapid Response Nurse updated concerning [place]: elevated pulse.  Noted no change, has been elevated during the day shift prior. Tele monitor maintained. Pt with frequent calls for pain med's.(neck pain) rated 8-9 out of 10. Prn pain med's given per MD order. See mar. Neck incision site, C/D/I.   Pt with request for pure wick. Phone and call light in reach, frequent verbal contacts for reassurance.

## 2019-10-08 NOTE — Progress Notes (Signed)
PROGRESS NOTE  Colleen Ramirez GQQ:[id number] DOB: Oct 22, 1980 DOA: 09/29/2019 PCP: Colleen Ramirez, No Pcp Per  Brief Narrative / Interim history: Colleen Ramirez an 39 y.o.femaleIV drug use (methamphetamine), anemia, anxiety, depression,s/pgastric bypass, and homelessness, whoinitiallypresented witha 2-day history ofseveresharpneck pain.Pain was constant,but worse with any kind of movement. Admits to last using"Ice"for crystal meth approximately 2 days PTA. Colleen Ramirez injects intoher arms. Just recently hospitalized from12/1/20-12/8/20withfacial cellulitis. She was doxycycline100 mg twice daily to complete a 10-day course and clindamycin external solution, but it appears not to have completed this.  Colleen Ramirez underwent ACDF by neurosurgery on 09/29/2019.  Intraoperative cultures with MRSA Continues on IV antibiotics with rifampin/vancomycin; will likely remain inpatient for duration of therapy given active IVDU.  Continues to persistently requests additional pain medication, complaining of persistent pain.   HPI/Recap of past 24 hours:  C/o right side neck edema appears has improved some She remains tachycardic, she continues to c/o inadequate pain control She  denies pain with swallowing, denies difficulty breathing   Assessment/Plan: Principal Problem:   Infection of cervical spine (HCC) Active Problems:   Sepsis ([place])   Depression   Acquired fusion of cervical spine   IVDU (intravenous drug user)   Homeless   S/P cervical discectomy   Anxiety   Status post gastric bypass for obesity   Hepatitis C antibody positive in blood   Staph aureus infection  Sepsis due to cervical epidural abscess, present on admission -Colleen Ramirez presented with tachycardia, elevated WBC count.  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  Colleen Ramirez underwent ACDF by neurosurgery on  09/29/2019.  Intraoperative cultures with MRSA. --Infectious disease, neurosurgery following, appreciate assistance --Continue vancomycin/rifampin per ID; plan 6-week course from surgery 09/29/2019 --Not a candidate for outpatient IV antibiotics with PICC line 2/2 current IVDU --Continue pain control, she tends to be oversedated at times; will continue pain management current levels  --She did require Decadron x1  For dysphagia post op , currently she denies dysphagia -neck swelling seems has improved some today  Sinus tachycardia -likely reactive, tsh wnl -continue tele since she required frequent prn opioids analgesics   Polysubstance abuse Colleen Ramirez reports mostly methamphetamine use in which he injects into her arms.  Not a candidate for PICC line as above. mostly meth per Colleen Ramirez --TOC consult  HCV positive --Per infectious disease  DVT Prophylaxis: Subcu heparin ordered, she refused it intermittently  Code Status: Full  Family Communication: Colleen Ramirez   Disposition Plan: Need skilled nursing facility, need long-term IV antibiotic therapy, history of polysubstance abuse, homeless per report difficult placement   Consultants:  Neurosurgery  Infectious disease  Social worker  PT /OT  Procedures: ACDF by neurosurgery on 09/29/2019.  Antibiotics:  IV vanc   Objective: BP 106/86 (BP Location: Right Arm)   Pulse (!) 104 Comment: Nurse Vee is aware.  Temp 97.8 F (36.6 C) (Oral)   Resp 14   Ht 5\' 3"  (1.6 m)   Wt 49.9 kg   SpO2 100%   BMI 19.49 kg/m   Intake/Output Summary (Last 24 hours) at 10/08/2019 0725 Last data filed at 10/08/2019 0546 Gross per 24 hour  Intake 303 ml  Output 2 ml  Net 301 ml   Filed Weights   09/29/19 2032  Weight: 49.9 kg    Exam: Colleen Ramirez is examined daily including today on 10/08/2019, exams remain the same as of yesterday except that has changed    General:  NAD, right neck postop changes with less  edema  Cardiovascular:  tachycardia  Respiratory: CTABL  Abdomen: Soft/ND/NT, positive BS  Musculoskeletal: No Edema  Neuro: alert, oriented   Data Reviewed: Basic Metabolic Panel: Recent Labs  Lab 10/02/19 0342 10/03/19 0313 10/04/19 1206 10/05/19 0546 10/06/19 0752  NA 137 133*  --  135 135  K 3.6 3.3*  --  4.8 3.8  CL 101 98  --  100 98  CO2 27 24  --  25 24  GLUCOSE 102* 156*  --  99 147*  BUN 5* 5*  --  8 8  CREATININE 0.43* 0.52 0.48 0.52 0.60  CALCIUM 8.7* 8.6*  --  9.3 8.9   Liver Function Tests: No results for input(s): AST, ALT, ALKPHOS, BILITOT, PROT, ALBUMIN in the last 168 hours. No results for input(s): LIPASE, AMYLASE in the last 168 hours. No results for input(s): AMMONIA in the last 168 hours. CBC: Recent Labs  Lab 10/02/19 0342 10/03/19 0313 10/05/19 0546 10/06/19 0752  WBC 10.4 9.8 11.2* 11.8*  HGB 11.3* 12.1 13.2 12.5  HCT 35.1* 36.6 40.9 39.0  MCV 86.0 84.9 85.7 87.2  PLT 401* 446* 639* 674*   Cardiac Enzymes:   No results for input(s): CKTOTAL, CKMB, CKMBINDEX, TROPONINI in the last 168 hours. BNP (last 3 results) No results for input(s): BNP in the last 8760 hours.  ProBNP (last 3 results) No results for input(s): PROBNP in the last 8760 hours.  CBG: No results for input(s): GLUCAP in the last 168 hours.  Recent Results (from the past 240 hour(s))  SARS CORONAVIRUS 2 (TAT 6-24 HRS) Nasopharyngeal Nasopharyngeal Swab     Status: None   Collection Time: 09/29/19 11:38 AM   Specimen: Nasopharyngeal Swab  Result Value Ref Range Status   SARS Coronavirus 2 NEGATIVE NEGATIVE Final    Comment: (NOTE) SARS-CoV-2 target nucleic acids are NOT DETECTED. The SARS-CoV-2 RNA is generally detectable in upper and lower respiratory specimens during the acute phase of infection. Negative results do not preclude SARS-CoV-2 infection, do not rule out co-infections with other pathogens, and should not be used as the sole basis for treatment or other Colleen Ramirez management  decisions. Negative results must be combined with clinical observations, Colleen Ramirez history, and epidemiological information. The expected result is Negative. Fact Sheet for Patients: HairSlick.no Fact Sheet for Healthcare Providers: quierodirigir.com This test is not yet approved or cleared by the [place] FDA and  has been authorized for detection and/or diagnosis of SARS-CoV-2 by FDA under an Emergency Use Authorization (EUA). This EUA will remain  in effect (meaning this test can be used) for the duration of the COVID-19 declaration under Section 56 4(b)(1) of the Act, 21 U.S.C. section 360bbb-3(b)(1), unless the authorization is terminated or revoked sooner. Performed at [facility], [address], [place], [place] [address]   Aerobic/Anaerobic Culture (surgical/deep wound)     Status: None   Collection Time: 09/29/19  9:43 PM   Specimen: Abscess  Result Value Ref Range Status   Specimen Description ABSCESS  Final   Special Requests CERVICAL EPIDURAL  Final   Gram Stain   Final    RARE WBC PRESENT, PREDOMINANTLY PMN FEW GRAM POSITIVE COCCI IN CLUSTERS    Culture   Final    ABUNDANT METHICILLIN RESISTANT STAPHYLOCOCCUS AUREUS NO ANAEROBES ISOLATED Performed at [facility], [address] [place], [place] [address]    Report Status 10/05/2019 FINAL  Final   Organism ID, Bacteria METHICILLIN RESISTANT STAPHYLOCOCCUS AUREUS  Final  Susceptibility   Methicillin resistant staphylococcus aureus - MIC*    CIPROFLOXACIN >=8 RESISTANT Resistant     ERYTHROMYCIN >=8 RESISTANT Resistant     GENTAMICIN <=0.5 SENSITIVE Sensitive     OXACILLIN >=4 RESISTANT Resistant     TETRACYCLINE <=1 SENSITIVE Sensitive     VANCOMYCIN <=0.5 SENSITIVE Sensitive     TRIMETH/SULFA 80 RESISTANT Resistant     CLINDAMYCIN >=8 RESISTANT Resistant     RIFAMPIN <=0.5 SENSITIVE Sensitive     Inducible Clindamycin NEGATIVE  Sensitive     * ABUNDANT METHICILLIN RESISTANT STAPHYLOCOCCUS AUREUS  MRSA PCR Screening     Status: Abnormal   Collection Time: 10/01/19  6:23 PM   Specimen: Nasal Mucosa; Nasopharyngeal  Result Value Ref Range Status   MRSA by PCR POSITIVE (A) NEGATIVE Final    Comment:        The GeneXpert MRSA Assay (FDA approved for NASAL specimens only), is one component of a comprehensive MRSA colonization surveillance program. It is not intended to diagnose MRSA infection nor to guide or monitor treatment for MRSA infections. RESULT CALLED TO, READ BACK BY AND VERIFIED WITH: A NEZIUS RN 10/01/19 2000 JDW Performed at [facility], [address], [place], [place] [address]      Studies: No results found.  Scheduled Meds: . Chlorhexidine Gluconate Cloth  6 each Topical Q0600  . [START ON 07/30/2020] docusate sodium  100 mg Oral BID  . feeding supplement (ENSURE ENLIVE)  237 mL Oral BID BM  . heparin injection (subcutaneous)  5,000 Units Subcutaneous Q8H  . lidocaine  1 patch Transdermal Daily  . multivitamin with minerals  1 tablet Oral Daily  . mupirocin ointment   Nasal BID  . nicotine  21 mg Transdermal Daily  . rifampin  300 mg Oral Q12H  . sodium chloride flush  3 mL Intravenous Q12H    Continuous Infusions: . sodium chloride 250 mL (09/30/19 0126)  . vancomycin 750 mg (10/08/19 0546)     Time spent: I have personally reviewed and interpreted on  10/08/2019 daily labs imagings as discussed above under date review session and assessment and plans.  I reviewed all nursing notes, pharmacy notes, consultant notes,  vitals, pertinent old records  I have discussed plan of care as described above with RN , Colleen Ramirez  on 10/08/2019   Albertine Grates MD, PhD, FACP  [facility] Pager [phone]. If 7PM-7AM, please contact night-coverage at [email or web address], password [facility] 10/08/2019, 7:25 AM  LOS: 9 days

## 2019-10-08 NOTE — Plan of Care (Signed)

## 2019-10-09 LAB — BASIC METABOLIC PANEL WITH GFR
Anion gap: 10 (ref 5–15)
BUN: 7 mg/dL (ref 6–20)
CO2: 29 mmol/L (ref 22–32)
Calcium: 9 mg/dL (ref 8.9–10.3)
Chloride: 97 mmol/L — ABNORMAL LOW (ref 98–111)
Creatinine, Ser: 0.57 mg/dL (ref 0.44–1.00)
GFR calc Af Amer: >60 mL/min (ref >60)
GFR calc non Af Amer: >60 mL/min (ref >60)
Glucose, Bld: 95 mg/dL (ref 70–99)
Potassium: 4.4 mmol/L (ref 3.5–5.1)
Sodium: 136 mmol/L (ref 135–145)

## 2019-10-09 LAB — CBC WITH DIFFERENTIAL/PLATELET
Abs Immature Granulocytes: 0.14 K/uL — ABNORMAL HIGH (ref 0.00–0.07)
Basophils Absolute: 0.1 K/uL (ref 0.0–0.1)
Basophils Relative: 1 %
Eosinophils Absolute: 0.2 K/uL (ref 0.0–0.5)
Eosinophils Relative: 2 %
HCT: 36.5 % (ref 36.0–46.0)
Hemoglobin: 11.8 g/dL — ABNORMAL LOW (ref 12.0–15.0)
Immature Granulocytes: 1 %
Lymphocytes Relative: 20 %
Lymphs Abs: 2.4 K/uL (ref 0.7–4.0)
MCH: 28.1 pg (ref 26.0–34.0)
MCHC: 32.3 g/dL (ref 30.0–36.0)
MCV: 86.9 fL (ref 80.0–100.0)
Monocytes Absolute: 0.8 K/uL (ref 0.1–1.0)
Monocytes Relative: 7 %
Neutro Abs: 8.1 K/uL — ABNORMAL HIGH (ref 1.7–7.7)
Neutrophils Relative %: 69 %
Platelets: 729 K/uL — ABNORMAL HIGH (ref 150–400)
RBC: 4.2 MIL/uL (ref 3.87–5.11)
RDW: 15.3 % (ref 11.5–15.5)
WBC: 11.7 K/uL — ABNORMAL HIGH (ref 4.0–10.5)
nRBC: 0 % (ref 0.0–0.2)

## 2019-10-09 LAB — VANCOMYCIN, TROUGH: Vancomycin Tr: 14 ug/mL — ABNORMAL LOW (ref 15–20)

## 2019-10-09 LAB — VANCOMYCIN, PEAK
Vancomycin Pk: 15 ug/mL — ABNORMAL LOW (ref 30–40)
Vancomycin Pk: 29 ug/mL — ABNORMAL LOW (ref 30–40)

## 2019-10-09 MED ORDER — OXYCODONE HCL 5 MG PO TABS
15.0000 mg | ORAL_TABLET | ORAL | Status: DC | PRN
  Administered 2019-10-09 – 2019-10-12 (×14): 15 mg via ORAL
  Filled 2019-10-09 (×17): qty 3

## 2019-10-09 MED ORDER — VANCOMYCIN HCL IN DEXTROSE 1-5 GM/200ML-% IV SOLN
1000.0000 mg | Freq: Two times a day (BID) | INTRAVENOUS | Status: DC
  Administered 2019-10-09 – 2019-10-12 (×7): 1000 mg via INTRAVENOUS
  Filled 2019-10-09 (×8): qty 200

## 2019-10-09 NOTE — Progress Notes (Signed)
Neurosurgery Service Progress Note  Subjective: No acute events overnight, no new complaints  Objective: Vitals:   10/08/19 1631 10/08/19 1938 10/09/19 0338 10/09/19 0752  BP: (!) 115/91 123/86 112/88 (!) 114/95  Pulse: (!) 106 (!) 115 92 93  Resp: 17 15  17   Temp: 98.2 F (36.8 C) 97.9 F (36.6 C) 97.6 F (36.4 C) 97.8 F (36.6 C)  TempSrc: Oral Oral Oral Oral  SpO2: 100% 100% 100% 99%  Weight:      Height:       Temp (24hrs), Avg:98 F (36.7 C), Min:97.6 F (36.4 C), Max:98.3 F (36.8 C)  CBC Latest Ref Rng & Units 10/09/2019 10/06/2019 10/05/2019  WBC 4.0 - 10.5 K/uL 11.7(H) 11.8(H) 11.2(H)  Hemoglobin 12.0 - 15.0 g/dL 11.8(L) 12.5 13.2  Hematocrit 36.0 - 46.0 % 36.5 39.0 40.9  Platelets 150 - 400 K/uL 729(H) 674(H) 639(H)   BMP Latest Ref Rng & Units 10/09/2019 10/06/2019 10/05/2019  Glucose 70 - 99 mg/dL 95 10/07/2019) 99  BUN 6 - 20 mg/dL 7 8 8   Creatinine 0.44 - 1.00 mg/dL 454(U 9.81  Sodium 135 - 145 mmol/L 136 135 135  Potassium 3.5 - 5.1 mmol/L 4.4 3.8 4.8  Chloride 98 - 111 mmol/L 97(L) 98 100  CO2 22 - 32 mmol/L 29 24 25   Calcium 8.9 - 10.3 mg/dL 9.0 8.9 9.3    Intake/Output Summary (Last 24 hours) at 10/09/2019 0800 Last data filed at 10/08/2019 2156 Gross per 24 hour  Intake 390 ml  Output 1 ml  Net 389 ml    Current Facility-Administered Medications:  .  0.9 %  sodium chloride infusion, 250 mL, Intravenous, Continuous, Corydon Schweiss A, MD, Last Rate: 1 mL/hr at 09/30/19 0126, 250 mL at 09/30/19 0126 .  acetaminophen (TYLENOL) tablet 650 mg, 650 mg, Oral, Q4H PRN, 650 mg at 10/03/19 1430 **OR** acetaminophen (TYLENOL) suppository 650 mg, 650 mg, Rectal, Q4H PRN, 10/02/19, MD .  Chlorhexidine Gluconate Cloth 2 % PADS 6 each, 6 each, Topical, Q0600, 10/02/19, MD, 6 each at 10/09/19 0604 .  cyclobenzaprine (FLEXERIL) tablet 10 mg, 10 mg, Oral, TID PRN, Jadene Pierini, MD, 10 mg at 10/07/19 1411 .  feeding supplement (ENSURE  ENLIVE) (ENSURE ENLIVE) liquid 237 mL, 237 mL, Oral, BID BM, Shresta Risden A, MD, 237 mL at 10/08/19 0913 .  heparin injection 5,000 Units, 5,000 Units, Subcutaneous, Q8H, Madalynn Pickelsimer, Jadene Pierini, MD, 5,000 Units at 10/08/19 1302 .  HYDROmorphone (DILAUDID) injection 1 mg, 1 mg, Intravenous, Q4H PRN, 10/10/19, MD, 1 mg at 10/09/19 0734 .  lidocaine (LIDODERM) 5 % 1 patch, 1 patch, Transdermal, Daily, Vann, Jessica U, DO .  menthol-cetylpyridinium (CEPACOL) lozenge 3 mg, 1 lozenge, Oral, PRN **OR** phenol (CHLORASEPTIC) mouth spray 1 spray, 1 spray, Mouth/Throat, PRN, Ailie Gage, Pieter Partridge, MD .  multivitamin with minerals tablet 1 tablet, 1 tablet, Oral, Daily, 10/11/19, MD, 1 tablet at 10/08/19 0909 .  mupirocin ointment (BACTROBAN) 2 %, , Nasal, BID, Clovis Pu, DO, Given at 10/08/19 2154 .  nicotine (NICODERM CQ - dosed in mg/24 hours) patch 21 mg, 21 mg, Transdermal, Daily, Joseph Art, Rondell A, MD, 21 mg at 10/08/19 0909 .  ondansetron (ZOFRAN) tablet 4 mg, 4 mg, Oral, Q6H PRN **OR** ondansetron (ZOFRAN) injection 4 mg, 4 mg, Intravenous, Q6H PRN, Cesare Sumlin A, MD .  oxyCODONE (Oxy IR/ROXICODONE) immediate release tablet 15 mg, 15 mg, Oral, Q4H PRN, 2155, Katrinka Blazing, MD, 15 mg  at 10/09/19 0603 .  polyethylene glycol (MIRALAX / GLYCOLAX) packet 17 g, 17 g, Oral, Daily PRN, Shandon Matson A, MD .  rifampin (RIFADIN) capsule 300 mg, 300 mg, Oral, Q12H, Michel Bickers, MD, 300 mg at 10/08/19 2152 .  senna-docusate (Senokot-S) tablet 1 tablet, 1 tablet, Oral, QHS, Florencia Reasons, MD, 1 tablet at 10/08/19 2152 .  sodium chloride flush (NS) 0.9 % injection 3 mL, 3 mL, Intravenous, Q12H, Shawnita Krizek, Joyice Faster, MD, 3 mL at 10/08/19 2154 .  sodium chloride flush (NS) 0.9 % injection 3 mL, 3 mL, Intravenous, PRN, Colleen Part, MD .  vancomycin (VANCOCIN) IVPB 750 mg/150 ml premix, 750 mg, Intravenous, Q8H, [name], [facility], Last Rate: 150 mL/hr at 10/09/19 0743,  750 mg at 10/09/19 7494   Physical Exam: AOx3, strength grossly 5/5x4 but pain limited proximally in BUE, mild fingertip numbness bilaterally Incision c/d/i Swelling around incision improving, neck soft, no dysphonia Wearing soft cervical collar  Assessment & Plan: 39 y.o. woman w/ polysubstance abuse, severe neck pain, cervical epidural abscess, 1/12 s/p C5-6 ACDF for abscess evacuation. Gram stain w/ GPCs in clusters. Echo w/ no vegetations  -can wear a soft collar for comfort, may help with neck pain, can wear prn -SW recs for placement, from my perspective, ready for transfer from SNF when able -my partners will be covering for me this weekend and are available should an issue arise, otherwise I will see Colleen Ramirez on Monday, please contact our service with any concerns or questions  Colleen Ramirez  10/09/19 8:00 AM

## 2019-10-09 NOTE — Plan of Care (Signed)
  Problem: Clinical Measurements: Goal: Ability to maintain clinical measurements within normal limits will improve Outcome: Progressing   Problem: Activity: Goal: Risk for activity intolerance will decrease Outcome: Progressing   Problem: Nutrition: Goal: Adequate nutrition will be maintained Outcome: Progressing   Problem: Coping: Goal: Level of anxiety will decrease Outcome: Progressing   Problem: Elimination: Goal: Will not experience complications related to bowel motility Outcome: Progressing   Problem: Safety: Goal: Ability to remain free from injury will improve Outcome: Progressing   Problem: Skin Integrity: Goal: Risk for impaired skin integrity will decrease Outcome: Progressing   

## 2019-10-09 NOTE — Progress Notes (Signed)
PROGRESS NOTE  Colleen Ramirez IZT:[id number] DOB: 12-04-1980 DOA: 09/29/2019 PCP: Patient, No Pcp Per  Brief Narrative / Interim history: Colleen Ramirez an 39 y.o.femaleIV drug use (methamphetamine), anemia, anxiety, depression,s/pgastric bypass, and homelessness, whoinitiallypresented witha 2-day history ofseveresharpneck pain.Pain was constant,but worse with any kind of movement. Admits to last using"Ice"for crystal meth approximately 2 days PTA. Patient injects intoher arms. Just recently hospitalized from12/1/20-12/8/20withfacial cellulitis. She was doxycycline100 mg twice daily to complete a 10-day course and clindamycin external solution, but it appears not to have completed this.  Patient underwent ACDF by neurosurgery on 09/29/2019.  Intraoperative cultures with MRSA Continues on IV antibiotics with rifampin/vancomycin; will likely remain inpatient for duration of therapy given active IVDU.  Continues to persistently requests additional pain medication, complaining of persistent pain.   HPI/Recap of past 24 hours:   Less  tachycardic,  she continues to c/o inadequate pain control She  denies pain with swallowing, denies difficulty breathing   Assessment/Plan: Principal Problem:   Infection of cervical spine (HCC) Active Problems:   Sepsis (HCC)   Depression   Acquired fusion of cervical spine   IVDU (intravenous drug user)   Homeless   S/P cervical discectomy   Anxiety   Status post gastric bypass for obesity   Hepatitis C antibody positive in blood   Staph aureus infection  Sepsis due to cervical epidural abscess, present on admission -Patient presented with tachycardia, elevated WBC count.  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  Patient underwent ACDF by neurosurgery on 09/29/2019.  Intraoperative cultures with MRSA. --Infectious  disease, neurosurgery following, appreciate assistance --Continue vancomycin/rifampin per ID; plan 6-week course from surgery 09/29/2019 --Not a candidate for outpatient IV antibiotics with PICC line 2/2 current IVDU --Continue pain control, keep IV Dilaudid at current dose and frequency, increase oxycodone 15 mg from every 4 hours as needed to every 3 hours as needed - she tends to be oversedated at times per previous hospitalist colleagues, continue close monitoring  --She did require Decadron x1  For dysphagia post op , currently she denies dysphagia -neck swelling seems has improved   Sinus tachycardia -likely reactive, tsh wnl -continue tele since she required frequent prn opioids analgesics  -Appear improving  Polysubstance abuse Patient reports mostly methamphetamine use in which he injects into her arms.  Not a candidate for PICC line as above. mostly meth per patient --TOC consult  HCV positive --Per infectious disease  DVT Prophylaxis: Subcu heparin ordered, she refused it intermittently  Code Status: Full  Family Communication: patient   Disposition Plan: Need skilled nursing facility, need long-term IV antibiotic therapy, history of polysubstance abuse, homeless per report  difficult placement   Consultants:  Neurosurgery  Infectious disease  Social worker  PT /OT  Procedures: ACDF by neurosurgery on 09/29/2019.  Antibiotics:  IV vanc   Objective: BP (!) 106/94 (BP Location: Left Arm)   Pulse 98   Temp 97.6 F (36.4 C) (Oral)   Resp 15   Ht 5\' 3"  (1.6 m)   Wt 49.9 kg   SpO2 100%   BMI 19.49 kg/m   Intake/Output Summary (Last 24 hours) at 10/09/2019 1452 Last data filed at 10/09/2019 1100 Gross per 24 hour  Intake 803.25 ml  Output --  Net 803.25 ml   Filed Weights   09/29/19 2032  Weight: 49.9 kg    Exam: Patient is examined daily including today on 10/09/2019, exams remain the same as of yesterday  except that has changed     General:  NAD, right neck postop changes with less edema, has soft neck collar on  Cardiovascular: Less tachycardia  Respiratory: CTABL  Abdomen: Soft/ND/NT, positive BS  Musculoskeletal: No Edema  Neuro: alert, oriented   Data Reviewed: Basic Metabolic Panel: Recent Labs  Lab 10/03/19 0313 10/04/19 1206 10/05/19 0546 10/06/19 0752 10/09/19 0530  NA 133*  --  135 135 136  K 3.3*  --  4.8 3.8 4.4  CL 98  --  100 98 97*  CO2 24  --  25 24 29   GLUCOSE 156*  --  99 147* 95  BUN 5*  --  8 8 7   CREATININE 0.52 0.48 0.52 0.60 0.57  CALCIUM 8.6*  --  9.3 8.9 9.0   Liver Function Tests: No results for input(s): AST, ALT, ALKPHOS, BILITOT, PROT, ALBUMIN in the last 168 hours. No results for input(s): LIPASE, AMYLASE in the last 168 hours. No results for input(s): AMMONIA in the last 168 hours. CBC: Recent Labs  Lab 10/03/19 0313 10/05/19 0546 10/06/19 0752 10/09/19 0530  WBC 9.8 11.2* 11.8* 11.7*  NEUTROABS  --   --   --  8.1*  HGB 12.1 13.2 12.5 11.8*  HCT 36.6 40.9 39.0 36.5  MCV 84.9 85.7 87.2 86.9  PLT 446* 639* 674* 729*   Cardiac Enzymes:   No results for input(s): CKTOTAL, CKMB, CKMBINDEX, TROPONINI in the last 168 hours. BNP (last 3 results) No results for input(s): BNP in the last 8760 hours.  ProBNP (last 3 results) No results for input(s): PROBNP in the last 8760 hours.  CBG: No results for input(s): GLUCAP in the last 168 hours.  Recent Results (from the past 240 hour(s))  Aerobic/Anaerobic Culture (surgical/deep wound)     Status: None   Collection Time: 09/29/19  9:43 PM   Specimen: Abscess  Result Value Ref Range Status   Specimen Description ABSCESS  Final   Special Requests CERVICAL EPIDURAL  Final   Gram Stain   Final    RARE WBC PRESENT, PREDOMINANTLY PMN FEW GRAM POSITIVE COCCI IN CLUSTERS    Culture   Final    ABUNDANT METHICILLIN RESISTANT STAPHYLOCOCCUS AUREUS NO ANAEROBES ISOLATED Performed at [facility], [address], [place], [address] [place]    Report Status 10/05/2019 FINAL  Final   Organism ID, Bacteria METHICILLIN RESISTANT STAPHYLOCOCCUS AUREUS  Final      Susceptibility   Methicillin resistant staphylococcus aureus - MIC*    CIPROFLOXACIN >=8 RESISTANT Resistant     ERYTHROMYCIN >=8 RESISTANT Resistant     GENTAMICIN <=0.5 SENSITIVE Sensitive     OXACILLIN >=4 RESISTANT Resistant     TETRACYCLINE <=1 SENSITIVE Sensitive     VANCOMYCIN <=0.5 SENSITIVE Sensitive     TRIMETH/SULFA 80 RESISTANT Resistant     CLINDAMYCIN >=8 RESISTANT Resistant     RIFAMPIN <=0.5 SENSITIVE Sensitive     Inducible Clindamycin NEGATIVE Sensitive     * ABUNDANT METHICILLIN RESISTANT STAPHYLOCOCCUS AUREUS  MRSA PCR Screening     Status: Abnormal   Collection Time: 10/01/19  6:23 PM   Specimen: Nasal Mucosa; Nasopharyngeal  Result Value Ref Range Status   MRSA by PCR POSITIVE (A) NEGATIVE Final    Comment:        The GeneXpert MRSA Assay (FDA approved for NASAL specimens only), is one component of a comprehensive MRSA colonization surveillance program. It is not intended to diagnose MRSA infection nor to guide or  monitor treatment for MRSA infections. RESULT CALLED TO, READ BACK BY AND VERIFIED WITH: A NEZIUS RN 10/01/19 2000 JDW Performed at [facility], [place] [address], [place], [place] [address]      Studies: No results found.  Scheduled Meds: . Chlorhexidine Gluconate Cloth  6 each Topical Q0600  . feeding supplement (ENSURE ENLIVE)  237 mL Oral BID BM  . heparin injection (subcutaneous)  5,000 Units Subcutaneous Q8H  . lidocaine  1 patch Transdermal Daily  . multivitamin with minerals  1 tablet Oral Daily  . mupirocin ointment   Nasal BID  . nicotine  21 mg Transdermal Daily  . rifampin  300 mg Oral Q12H  . senna-docusate  1 tablet Oral QHS  . sodium chloride flush  3 mL Intravenous Q12H    Continuous Infusions: . sodium chloride 250 mL (09/30/19 0126)  . vancomycin        Time spent: 34mins I have personally reviewed and interpreted on  10/09/2019 daily labs imagings as discussed above under date review session and assessment and plans.  I reviewed all nursing notes, pharmacy notes, consultant notes,  vitals, pertinent old records  I have discussed plan of care as described above with RN , patient  on 10/09/2019   Florencia Reasons MD, PhD, FACP  [facility] Pager [phone]. If 7PM-7AM, please contact night-coverage at [email or web address], password [facility] 10/09/2019, 2:52 PM  LOS: 10 days

## 2019-10-09 NOTE — Progress Notes (Addendum)
Pharmacy Antibiotic Note  Colleen Ramirez is a 39 y.o. female admitted on 09/29/2019 with MRSA cervical spine infection. Pharmacy has been consulted for Vancomycin dosing - levels pending for today. Also on rifampin. Renal function remains stable.  Plan: - Continue Vancomycin 750 mg IV every 8 hours - Rifampin 300mg  PO q12h - Watch renal function. Will f/u vancomycin levels on 1/22 - ID planning 6 weeks of treatment  Height: 5\' 3"  (160 cm) Weight: 110 lb (49.9 kg) IBW/kg (Calculated) : 52.4  Temp (24hrs), Avg:98 F (36.7 C), Min:97.6 F (36.4 C), Max:98.3 F (36.8 C)  Recent Labs  Lab 10/03/19 0313 10/03/19 2053 10/04/19 1206 10/04/19 1206 10/05/19 0546 10/06/19 0752 10/06/19 1452 10/09/19 0530  WBC 9.8  --   --   --  11.2* 11.8*  --  11.7*  CREATININE 0.52  --  0.48  --  0.52 0.60  --  0.57  VANCOTROUGH  --   --  <4*   < >  --   --  11* 14*  VANCOPEAK  --    < >  --   --   --  32  --  15*   < > = values in this interval not displayed.    Estimated Creatinine Clearance: 74.4 mL/min (by C-G formula based on SCr of 0.57 mg/dL).    No Known Allergies  Antimicrobials this admission: Zosyn 1/12 >> 1/13 Vanc 1/12 >> Rifampin 1/14 >>  Dose adjustments this admission: 1/16 & 1/17: 21 and <4 - 1g x 1 and adj to 750 mg/8h 1/19: VP 32, VT 11, AUC 521 - no change  Microbiology results: 1/12 abscess cx - MRSA  2/19, PharmD, BCPS Please check AMION for all [facility] contact numbers Clinical Pharmacist 10/09/2019 10:44 AM

## 2019-10-09 NOTE — Progress Notes (Signed)
Pharmacy Antibiotic Note  Colleen Ramirez is a 39 y.o. female admitted on 09/29/2019 with MRSA cervical spine infection. Pharmacy has been consulted for Vancomycin dosing. Also on rifampin. Renal function remains stable.  Vancomycin peak 29, trough of 14 calculated AUC 542.5. Trough was extrapolated since the peak was not drawn last night. Pt specific kinetics: Vd: 29.4L, Ke: 0.1409 T 1/2 4.9 hours  Patient still within the goal AUC on 750mg  q8h but her levels are increasing from previous, will reduce her frequency to q12h. Calculated AUC on 1000mg  q12h is 481.5.  Plan: - Continue Vancomycin 1000 mg IV every 12 hours - Rifampin 300mg  PO q12h - Watch renal function. - ID planning 6 weeks of treatment  Height: 5\' 3"  (160 cm) Weight: 110 lb (49.9 kg) IBW/kg (Calculated) : 52.4  Temp (24hrs), Avg:98 F (36.7 C), Min:97.6 F (36.4 C), Max:98.3 F (36.8 C)  Recent Labs  Lab 10/03/19 0313 10/03/19 2053 10/04/19 1206 10/04/19 1206 10/05/19 0546 10/06/19 0752 10/06/19 0752 10/06/19 1452 10/09/19 0530 10/09/19 1005  WBC 9.8  --   --   --  11.2* 11.8*  --   --  11.7*  --   CREATININE 0.52  --  0.48  --  0.52 0.60  --   --  0.57  --   VANCOTROUGH  --   --  <4*   < >  --   --   --  11* 14*  --   VANCOPEAK  --    < >  --   --   --  32   < >  --  15* 29*   < > = values in this interval not displayed.    Estimated Creatinine Clearance: 74.4 mL/min (by C-G formula based on SCr of 0.57 mg/dL).    No Known Allergies  Antimicrobials this admission: Zosyn 1/12 >> 1/13 Vanc 1/12 >> Rifampin 1/14 >>  Dose adjustments this admission: 1/16 & 1/17: 21 and <4 - 1g x 1 and adj to 750 mg/8h 1/19: VP 32, VT 11, AUC 521 - no change 1/22 VP/VT 29/14 AUC 542 - adjusted to 1000mg  q12h  Microbiology results: 1/12 abscess cx - MRSA  03-24-1973, PharmD PGY2 Infectious Disease Pharmacy Resident  10/09/2019 11:16 AM

## 2019-10-09 NOTE — Plan of Care (Addendum)
  Problem: Skin Integrity: Goal: Risk for impaired skin integrity will decrease Outcome: Progressing   Problem: Pain Managment: Goal: General experience of comfort will improve Outcome: Progressing   Problem: Elimination: Goal: Will not experience complications related to bowel motility Outcome: Progressing   Problem: Clinical Measurements: Goal: Cardiovascular complication will be avoided Outcome: Progressing   Problem: Clinical Measurements: Goal: Will remain free from infection Outcome: Progressing   Problem: Clinical Measurements: Goal: Ability to maintain clinical measurements within normal limits will improve Outcome: Progressing    Pt continues to refuse Heparin SQ scheduled doses. Educated pt to importance, pt verbalized understanding.

## 2019-10-10 MED ORDER — ENOXAPARIN SODIUM 40 MG/0.4ML ~~LOC~~ SOLN
40.0000 mg | SUBCUTANEOUS | Status: DC
  Administered 2019-10-10: 40 mg via SUBCUTANEOUS
  Filled 2019-10-10 (×11): qty 0.4

## 2019-10-10 MED ORDER — METOPROLOL TARTRATE 5 MG/5ML IV SOLN: 2.5000 mg | Freq: Three times a day (TID) | INTRAVENOUS | Status: DC

## 2019-10-10 MED ORDER — METOPROLOL TARTRATE 5 MG/5ML IV SOLN: 2.5000 mg | Freq: Three times a day (TID) | INTRAVENOUS | Status: DC | PRN

## 2019-10-10 MED ORDER — OXYCODONE-ACETAMINOPHEN 5-325 MG PO TABS
1.0000 | ORAL_TABLET | Freq: Every day | ORAL | Status: AC
  Administered 2019-10-10 – 2019-10-11 (×2): 1 via ORAL
  Filled 2019-10-10 (×2): qty 1

## 2019-10-10 NOTE — Plan of Care (Signed)

## 2019-10-10 NOTE — Plan of Care (Signed)

## 2019-10-10 NOTE — Progress Notes (Signed)
Pt is upset about the timing of PRN pain medications, RN explained the need for safety and the need to space out such medications. Pt verbalized understanding but still wishes to speak with her provider. Will continue to monitor and educate.  Daxon Kyne Mena Goes, RN 10/10/2019 2:13 PM

## 2019-10-10 NOTE — Progress Notes (Addendum)
PROGRESS NOTE  Colleen Ramirez QPY:[id number] DOB: December 25, 1980 DOA: 09/29/2019 PCP: Patient, No Pcp Per  Brief Narrative / Interim history: Colleen Solitariois an 39 y.o.femaleIV drug use (methamphetamine), anemia, anxiety, depression,s/pgastric bypass, and homelessness, whoinitiallypresented witha 2-day history ofseveresharpneck pain.Pain was constant,but worse with any kind of movement. Admits to last using"Ice"for crystal meth approximately 2 days PTA. Patient injects intoher arms. Just recently hospitalized from12/1/20-12/8/20withfacial cellulitis. She was doxycycline100 mg twice daily to complete a 10-day course and clindamycin external solution, but it appears not to have completed this.  Patient underwent ACDF by neurosurgery on 09/29/2019.  Intraoperative cultures with MRSA Continues on IV antibiotics with rifampin/vancomycin; will likely remain inpatient for duration of therapy given active IVDU.  Continues to persistently requests additional pain medication, complaining of persistent pain.   HPI/Recap of past 24 hours:   She is ordering meals over the phone, no distress noticed  Moving all extremities spontaneously   Assessment/Plan: Principal Problem:   Infection of cervical spine (HCC) Active Problems:   Sepsis (HCC)   Depression   Acquired fusion of cervical spine   IVDU (intravenous drug user)   Homeless   S/P cervical discectomy   Anxiety   Status post gastric bypass for obesity   Hepatitis C antibody positive in blood   Staph aureus infection  Sepsis due to cervical epidural abscess, present on admission -Patient presented with tachycardia, elevated WBC count.  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  Patient underwent ACDF by neurosurgery on 09/29/2019.  Intraoperative cultures with MRSA. --Infectious disease, neurosurgery following,  appreciate assistance --Continue vancomycin/rifampin per ID; plan 6-week course from surgery 09/29/2019 --Not a candidate for outpatient IV antibiotics with PICC line 2/2 current IVDU --Continue pain control, keep IV Dilaudid at current dose and frequency, increase oxycodone 15 mg from every 4 hours as needed to every 3 hours as needed on 1/22, scheduled Percocet 5/325 nightly due to c/o  more pain at night not able to sleep well - she tends to be oversedated at times per previous hospitalist colleagues, continue close monitoring, she is alert today  --She did require Decadron x1  For dysphagia post op , currently she denies dysphagia -neck swelling seems has improved   Sinus tachycardia -likely reactive, tsh wnl -continue tele since she required frequent prn opioids analgesics  -Appear is improving  Polysubstance abuse Patient reports mostly methamphetamine use in which he injects into her arms.  Not a candidate for PICC line as above. mostly meth per patient --TOC consult  HCV positive --Per infectious disease  DVT Prophylaxis: Subcu heparin ordered, she refused it intermittently, I have discussed with her to change to subcu Lovenox to decrease injection frequency , she agreed to give it a  Try.  Code Status: Full  Family Communication: patient   Disposition Plan: Need skilled nursing facility, need long-term IV antibiotic therapy, history of polysubstance abuse, homeless per report , no insurance  difficult placement   Consultants:  Neurosurgery  Infectious disease  Social worker  PT /OT  Procedures: ACDF by neurosurgery on 09/29/2019.  Antibiotics:  IV vanc   Objective: BP 118/90 (BP Location: Right Arm)   Pulse (!) 108   Temp 98.3 F (36.8 C) (Oral)   Resp 15   Ht 5\' 3"  (1.6 m)   Wt 49.9 kg   SpO2 100%   BMI 19.49 kg/m   Intake/Output Summary (Last 24 hours) at 10/10/2019 1138 Last data filed at 10/10/2019 0900 Gross  per 24 hour  Intake 480 ml    Output 2500 ml  Net -2020 ml   Filed Weights   09/29/19 2032  Weight: 49.9 kg    Exam: Patient is examined daily including today on 10/10/2019, exams remain the same as of yesterday except that has changed    General:  NAD, right neck postop changes with less edema, has soft neck collar on  Cardiovascular: Less tachycardia  Respiratory: CTABL  Abdomen: Soft/ND/NT, positive BS  Musculoskeletal: No Edema  Neuro: alert, oriented   Data Reviewed: Basic Metabolic Panel: Recent Labs  Lab 10/04/19 1206 10/05/19 0546 10/06/19 0752 10/09/19 0530  NA  --  135 135 136  K  --  4.8 3.8 4.4  CL  --  100 98 97*  CO2  --  25 24 29   GLUCOSE  --  99 147* 95  BUN  --  8 8 7   CREATININE 0.48 0.52 0.60 0.57  CALCIUM  --  9.3 8.9 9.0   Liver Function Tests: No results for input(s): AST, ALT, ALKPHOS, BILITOT, PROT, ALBUMIN in the last 168 hours. No results for input(s): LIPASE, AMYLASE in the last 168 hours. No results for input(s): AMMONIA in the last 168 hours. CBC: Recent Labs  Lab 10/05/19 0546 10/06/19 0752 10/09/19 0530  WBC 11.2* 11.8* 11.7*  NEUTROABS  --   --  8.1*  HGB 13.2 12.5 11.8*  HCT 40.9 39.0 36.5  MCV 85.7 87.2 86.9  PLT 639* 674* 729*   Cardiac Enzymes:   No results for input(s): CKTOTAL, CKMB, CKMBINDEX, TROPONINI in the last 168 hours. BNP (last 3 results) No results for input(s): BNP in the last 8760 hours.  ProBNP (last 3 results) No results for input(s): PROBNP in the last 8760 hours.  CBG: No results for input(s): GLUCAP in the last 168 hours.  Recent Results (from the past 240 hour(s))  MRSA PCR Screening     Status: Abnormal   Collection Time: 10/01/19  6:23 PM   Specimen: Nasal Mucosa; Nasopharyngeal  Result Value Ref Range Status   MRSA by PCR POSITIVE (A) NEGATIVE Final    Comment:        The GeneXpert MRSA Assay (FDA approved for NASAL specimens only), is one component of a comprehensive MRSA colonization surveillance program.  It is not intended to diagnose MRSA infection nor to guide or monitor treatment for MRSA infections. RESULT CALLED TO, READ BACK BY AND VERIFIED WITH: A NEZIUS RN 10/01/19 2000 JDW Performed at [facility], [address], [place], [address] [place]      Studies: No results found.  Scheduled Meds: . Chlorhexidine Gluconate Cloth  6 each Topical Q0600  . feeding supplement (ENSURE ENLIVE)  237 mL Oral BID BM  . heparin injection (subcutaneous)  5,000 Units Subcutaneous Q8H  . lidocaine  1 patch Transdermal Daily  . multivitamin with minerals  1 tablet Oral Daily  . mupirocin ointment   Nasal BID  . nicotine  21 mg Transdermal Daily  . rifampin  300 mg Oral Q12H  . senna-docusate  1 tablet Oral QHS  . sodium chloride flush  3 mL Intravenous Q12H    Continuous Infusions: . sodium chloride 250 mL (10/09/19 2024)  . vancomycin 1,000 mg (10/10/19 0816)     Time spent: 2025 I have personally reviewed and interpreted on  10/10/2019 daily labs imagings as discussed above under date review session and assessment and plans.  I reviewed all nursing notes, pharmacy  notes, consultant notes,  vitals, pertinent old records  I have discussed plan of care as described above with RN , patient  on 10/10/2019   Florencia Reasons MD, PhD, FACP  [facility] Pager [phone]. If 7PM-7AM, please contact night-coverage at [email or web address], password [facility] 10/10/2019, 11:38 AM  LOS: 11 days

## 2019-10-10 NOTE — Social Work (Signed)
CSW acknowledging consult for substance use. Will evaluate pt needs and interest in resources.  Pt will need to remain inpatient to complete iv antibiotics due to multiple barriers including but not limited to homelessness, substance use, and no current insurance. TOC team will follow with pt - Pt was provided with MATCH with $0 copay, will need dates updated at dc. Has appt at [facility], will need rescheduled at dc  Octavio Graves, MSW, [facility] Clinical Social Work

## 2019-10-11 LAB — HEPATIC FUNCTION PANEL
ALT: 19 U/L (ref 0–44)
AST: 18 U/L (ref 15–41)
Albumin: 2.5 g/dL — ABNORMAL LOW (ref 3.5–5.0)
Alkaline Phosphatase: 82 U/L (ref 38–126)
Bilirubin, Direct: <0.1 mg/dL (ref 0.0–0.2)
Total Bilirubin: 0.4 mg/dL (ref 0.3–1.2)
Total Protein: 6.9 g/dL (ref 6.5–8.1)

## 2019-10-11 MED ORDER — SENNOSIDES-DOCUSATE SODIUM 8.6-50 MG PO TABS
1.0000 | ORAL_TABLET | Freq: Two times a day (BID) | ORAL | Status: DC
  Administered 2019-10-11 – 2019-12-03 (×21): 1 via ORAL
  Filled 2019-10-11 (×79): qty 1

## 2019-10-11 NOTE — Progress Notes (Signed)
PROGRESS NOTE  Colleen Ramirez OMA:[id number] DOB: 03/03/1981 DOA: 09/29/2019 PCP: Patient, No Pcp Per  Brief Narrative / Interim history: Colleen Solitariois an 39 y.o.femaleIV drug use (methamphetamine), anemia, anxiety, depression,s/pgastric bypass, and homelessness, whoinitiallypresented witha 2-day history ofseveresharpneck pain.Pain was constant,but worse with any kind of movement. Admits to last using"Ice"for crystal meth approximately 2 days PTA. Patient injects intoher arms. Just recently hospitalized from12/1/20-12/8/20withfacial cellulitis. She was doxycycline100 mg twice daily to complete a 10-day course and clindamycin external solution, but it appears not to have completed this.  Patient underwent ACDF by neurosurgery on 09/29/2019.  Intraoperative cultures with MRSA Continues on IV antibiotics with rifampin/vancomycin; will likely remain inpatient for duration of therapy given active IVDU.  Continues to persistently requests additional pain medication, complaining of persistent pain.   HPI/Recap of past 24 hours:   She reports pain not well controlled at night, she is feeling ok this am  Moving all extremities spontaneously , neck collar on  Assessment/Plan: Principal Problem:   Infection of cervical spine (HCC) Active Problems:   Sepsis (HCC)   Depression   Acquired fusion of cervical spine   IVDU (intravenous drug user)   Homeless   S/P cervical discectomy   Anxiety   Status post gastric bypass for obesity   Hepatitis C antibody positive in blood   Staph aureus infection  Sepsis due to cervical epidural abscess, present on admission -Patient presented with tachycardia, elevated WBC count.  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  Patient underwent ACDF by neurosurgery on 09/29/2019.  Intraoperative cultures with MRSA. --Infectious  disease, neurosurgery following, appreciate assistance --Continue vancomycin/rifampin per ID; plan 6-week course from surgery 09/29/2019 --Not a candidate for outpatient IV antibiotics with PICC line 2/2 current IVDU --Continue pain control, keep IV Dilaudid at current dose and frequency, increase oxycodone 15 mg from every 4 hours as needed to every 3 hours as needed on 1/22, scheduled Percocet 5/325 nightly due to c/o  more pain at night not able to sleep well - she tends to be oversedated at times per previous hospitalist colleagues, continue close monitoring, she is alert today  --She did require Decadron x1  For dysphagia post op , currently she denies dysphagia -neck swelling seems has improved   Sinus tachycardia -likely reactive, tsh wnl -continue tele since she required frequent prn opioids analgesics , prn metoprol for hr >120 -Appear is improving  Polysubstance abuse Patient reports mostly methamphetamine use in which he injects into her arms.  Not a candidate for PICC line as above. mostly meth per patient --TOC consult  HCV positive --Per infectious disease  DVT Prophylaxis: Subcu heparin ordered, she refused it intermittently, I have discussed with her to change to subcu Lovenox to decrease injection frequency , she agreed to give it a  Try.  Code Status: Full  Family Communication: patient   Disposition Plan: Need skilled nursing facility, need long-term IV antibiotic therapy, history of polysubstance abuse, homeless per report , no insurance  difficult placement   Consultants:  Neurosurgery  Infectious disease  Social worker  PT /OT  Procedures: ACDF by neurosurgery on 09/29/2019.  Antibiotics:  IV vanc   Objective: BP 107/77 (BP Location: Left Arm)   Pulse 98   Temp 98.4 F (36.9 C) (Oral)   Resp 17   Ht 5\' 3"  (1.6 m)   Wt 49.9 kg   SpO2 100%   BMI 19.49 kg/m   Intake/Output Summary (Last 24  hours) at 10/11/2019 0731 Last data filed at  10/11/2019 0100 Gross per 24 hour  Intake 840 ml  Output 1800 ml  Net -960 ml   Filed Weights   09/29/19 2032  Weight: 49.9 kg    Exam: Patient is examined daily including today on 10/11/2019, exams remain the same as of yesterday except that has changed    General:  NAD, right neck postop changes with less edema, has soft neck collar on  Cardiovascular: Less tachycardia  Respiratory: CTABL  Abdomen: Soft/ND/NT, positive BS  Musculoskeletal: No Edema  Neuro: alert, oriented   Data Reviewed: Basic Metabolic Panel: Recent Labs  Lab 10/04/19 1206 10/05/19 0546 10/06/19 0752 10/09/19 0530  NA  --  135 135 136  K  --  4.8 3.8 4.4  CL  --  100 98 97*  CO2  --  25 24 29   GLUCOSE  --  99 147* 95  BUN  --  8 8 7   CREATININE 0.48 0.52 0.60 0.57  CALCIUM  --  9.3 8.9 9.0   Liver Function Tests: Recent Labs  Lab 10/11/19 0616  AST 18  ALT 19  ALKPHOS 82  BILITOT 0.4  PROT 6.9  ALBUMIN 2.5*   No results for input(s): LIPASE, AMYLASE in the last 168 hours. No results for input(s): AMMONIA in the last 168 hours. CBC: Recent Labs  Lab 10/05/19 0546 10/06/19 0752 10/09/19 0530  WBC 11.2* 11.8* 11.7*  NEUTROABS  --   --  8.1*  HGB 13.2 12.5 11.8*  HCT 40.9 39.0 36.5  MCV 85.7 87.2 86.9  PLT 639* 674* 729*   Cardiac Enzymes:   No results for input(s): CKTOTAL, CKMB, CKMBINDEX, TROPONINI in the last 168 hours. BNP (last 3 results) No results for input(s): BNP in the last 8760 hours.  ProBNP (last 3 results) No results for input(s): PROBNP in the last 8760 hours.  CBG: No results for input(s): GLUCAP in the last 168 hours.  Recent Results (from the past 240 hour(s))  MRSA PCR Screening     Status: Abnormal   Collection Time: 10/01/19  6:23 PM   Specimen: Nasal Mucosa; Nasopharyngeal  Result Value Ref Range Status   MRSA by PCR POSITIVE (A) NEGATIVE Final    Comment:        The GeneXpert MRSA Assay (FDA approved for NASAL specimens only), is one  component of a comprehensive MRSA colonization surveillance program. It is not intended to diagnose MRSA infection nor to guide or monitor treatment for MRSA infections. RESULT CALLED TO, READ BACK BY AND VERIFIED WITH: A NEZIUS RN 10/01/19 2000 JDW Performed at [facility], [address] [place], [address] [place]      Studies: No results found.  Scheduled Meds: . Chlorhexidine Gluconate Cloth  6 each Topical Q0600  . enoxaparin (LOVENOX) injection  40 mg Subcutaneous Q24H  . feeding supplement (ENSURE ENLIVE)  237 mL Oral BID BM  . lidocaine  1 patch Transdermal Daily  . multivitamin with minerals  1 tablet Oral Daily  . mupirocin ointment   Nasal BID  . nicotine  21 mg Transdermal Daily  . oxyCODONE-acetaminophen  1 tablet Oral QHS  . rifampin  300 mg Oral Q12H  . senna-docusate  1 tablet Oral BID  . sodium chloride flush  3 mL Intravenous Q12H    Continuous Infusions: . sodium chloride 250 mL (10/09/19 2024)  . vancomycin 1,000 mg (10/10/19 2010)     Time spent: 2025 I  have personally reviewed and interpreted on  10/11/2019 daily labs imagings as discussed above under date review session and assessment and plans.  I reviewed all nursing notes, pharmacy notes, consultant notes,  vitals, pertinent old records  I have discussed plan of care as described above with RN , patient  on 10/11/2019   Florencia Reasons MD, PhD, FACP  [facility] Pager [phone]. If 7PM-7AM, please contact night-coverage at [email or web address], password [facility] 10/11/2019, 7:31 AM  LOS: 12 days

## 2019-10-11 NOTE — Plan of Care (Signed)
  Problem: Education: Goal: Knowledge of General Education information will improve Description: Including pain rating scale, medication(s)/side effects and non-pharmacologic comfort measures Outcome: Progressing   Problem: Activity: Goal: Risk for activity intolerance will decrease Outcome: Progressing   Problem: Nutrition: Goal: Adequate nutrition will be maintained Outcome: Progressing   Problem: Coping: Goal: Level of anxiety will decrease Outcome: Progressing   

## 2019-10-11 NOTE — Plan of Care (Signed)
  Problem: Pain Managment: Goal: General experience of comfort will improve Outcome: Progressing   Problem: Safety: Goal: Ability to remain free from injury will improve Outcome: Progressing   Problem: Skin Integrity: Goal: Risk for impaired skin integrity will decrease Outcome: Progressing   

## 2019-10-12 DIAGNOSIS — M546 Pain in thoracic spine: Secondary | ICD-10-CM

## 2019-10-12 LAB — BASIC METABOLIC PANEL
Anion gap: 11 (ref 5–15)
BUN: 10 mg/dL (ref 6–20)
CO2: 29 mmol/L (ref 22–32)
Calcium: 8.9 mg/dL (ref 8.9–10.3)
Chloride: 95 mmol/L — ABNORMAL LOW (ref 98–111)
Creatinine, Ser: 0.59 mg/dL (ref 0.44–1.00)
GFR calc Af Amer: >60 mL/min (ref >60)
GFR calc non Af Amer: >60 mL/min (ref >60)
Glucose, Bld: 120 mg/dL — ABNORMAL HIGH (ref 70–99)
Potassium: 4.2 mmol/L (ref 3.5–5.1)
Sodium: 135 mmol/L (ref 135–145)

## 2019-10-12 LAB — VANCOMYCIN, PEAK: Vancomycin Pk: 24 ug/mL — ABNORMAL LOW (ref 30–40)

## 2019-10-12 MED ORDER — HYDROMORPHONE HCL 1 MG/ML IJ SOLN
0.5000 mg | INTRAMUSCULAR | Status: DC | PRN
  Administered 2019-10-12 – 2019-10-26 (×81): 0.5 mg via INTRAVENOUS
  Filled 2019-10-12 (×81): qty 1

## 2019-10-12 MED ORDER — OXYCODONE HCL 5 MG PO TABS
20.0000 mg | ORAL_TABLET | ORAL | Status: DC | PRN
  Administered 2019-10-12 – 2019-10-25 (×81): 20 mg via ORAL
  Filled 2019-10-12 (×83): qty 4

## 2019-10-12 MED ORDER — KETOROLAC TROMETHAMINE 30 MG/ML IJ SOLN
30.0000 mg | Freq: Four times a day (QID) | INTRAMUSCULAR | Status: AC | PRN
  Administered 2019-10-12 – 2019-10-17 (×4): 30 mg via INTRAVENOUS
  Filled 2019-10-12 (×5): qty 1

## 2019-10-12 NOTE — Progress Notes (Signed)
Physical Therapy Treatment Patient Details Name: Colleen Ramirez MRN: [id number] DOB: 09/02/1981 Today's Date: 10/12/2019    History of Present Illness Colleen Ramirez is a 39 y.o. F admitted to the ED with c/o severe neck pain secondary to a cervical epidural abscess. She is s/p C5-6 anterior cervical discectomy and instrumented fusion. PMH: IVDU, anemia, anxiety, depression, recent admission for facial cellulitis.     PT Comments    Pt ambulated good hallway distance this session, limited by neck pain and fatigue. PT encouraged pt to ambulate daily with RN staff, as pt with no LOB noted. Min verbal cuing for safety with cervical precautions during mobility, but overall pt is doing well with mobility. PT to continue to follow acutely.    Follow Up Recommendations  SNF     Equipment Recommendations  Other (comment)(TBD next venue)    Recommendations for Other Services       Precautions / Restrictions Precautions Precautions: Fall;Cervical Precaution Comments: reviewed cervical precautions with pt Required Braces or Orthoses: Cervical Brace Cervical Brace: Soft collar;At all times Restrictions Weight Bearing Restrictions: No    Mobility  Bed Mobility Overal bed mobility: Modified Independent Bed Mobility: Rolling;Sidelying to Sit Rolling: Supervision Sidelying to sit: Supervision       General bed mobility comments: pt up in chair upon PT arrival, requesting stay in recliner upon PT exit  Transfers Overall transfer level: Needs assistance Equipment used: None Transfers: Sit to/from Stand Sit to Stand: Supervision         General transfer comment: supervision for safety, increased time to rise.  Ambulation/Gait Ambulation/Gait assistance: Modified independent (Device/Increase time) Gait Distance (Feet): 220 Feet Assistive device: None Gait Pattern/deviations: Step-through pattern;Decreased stride length Gait velocity: decr   General Gait Details: supervision  for safety, slow and steady gait. Pt with HRmax 136 bpm during ambulation, pt states "my heart rate is always high". Verbal cuing for turning body as a unit to prevent neck/spine torsion, upright head positioning.   Stairs             Wheelchair Mobility    Modified Rankin (Stroke Patients Only)       Balance Overall balance assessment: Mild deficits observed, not formally tested                                          Cognition Arousal/Alertness: Awake/alert Behavior During Therapy: WFL for tasks assessed/performed Overall Cognitive Status: Within Functional Limits for tasks assessed                                 General Comments: pt pleasant and conversant      Exercises Other Exercises Other Exercises: issued yellow theraputty to pt and instructed pt on FMC/strenthening exercises; pt able to return demo    General Comments        Pertinent Vitals/Pain Pain Assessment: 0-10 Pain Score: 5  Pain Location: neck Pain Descriptors / Indicators: Discomfort;Sore Pain Intervention(s): Limited activity within patient's tolerance;Monitored during session;Premedicated before session;Repositioned    Home Living                      Prior Function            PT Goals (current goals can now be found in the care plan section) Acute Rehab PT Goals Patient  Stated Goal: decrease pain PT Goal Formulation: With patient Time For Goal Achievement: 10/14/19 Potential to Achieve Goals: Good Progress towards PT goals: Progressing toward goals    Frequency    Min 2X/week      PT Plan Current plan remains appropriate    Co-evaluation              AM-PAC PT "6 Clicks" Mobility   Outcome Measure  Help needed turning from your back to your side while in a flat bed without using bedrails?: A Little Help needed moving from lying on your back to sitting on the side of a flat bed without using bedrails?: A Little Help  needed moving to and from a bed to a chair (including a wheelchair)?: None Help needed standing up from a chair using your arms (e.g., wheelchair or bedside chair)?: None Help needed to walk in hospital room?: None Help needed climbing 3-5 steps with a railing? : A Little 6 Click Score: 21    End of Session Equipment Utilized During Treatment: Cervical collar Activity Tolerance: Patient tolerated treatment well;Patient limited by pain Patient left: with call bell/phone within reach;in chair   PT Visit Diagnosis: Unsteadiness on feet (R26.81);Other abnormalities of gait and mobility (R26.89);Muscle weakness (generalized) (M62.81);Pain Pain - Right/Left: Right(central) Pain - part of body: (neck)     Time: 3825-0539 PT Time Calculation (min) (ACUTE ONLY): 13 min  Charges:  $Gait Training: 8-22 mins                     Iza Preston E, PT [place] Pager [phone]  Office [phone]   Alechia Lezama D Elonda Husky 10/12/2019, 5:21 PM

## 2019-10-12 NOTE — Plan of Care (Signed)
  Problem: Pain Managment: Goal: General experience of comfort will improve Outcome: Progressing   Problem: Safety: Goal: Ability to remain free from injury will improve Outcome: Progressing   Problem: Skin Integrity: Goal: Risk for impaired skin integrity will decrease Outcome: Progressing   

## 2019-10-12 NOTE — Progress Notes (Signed)
Neurosurgery Service Progress Note  Subjective: No acute events overnight, no new complaints, still having some  Stable inter-scapular pain bilaterally, otherwise no complaints  Objective: Vitals:   10/11/19 2037 10/12/19 0446 10/12/19 0843 10/12/19 0845  BP: 109/77 111/76  (!) 122/92  Pulse: (!) 108 99 96 92  Resp: 16 20  17   Temp: 97.7 F (36.5 C) 98.5 F (36.9 C)  98.4 F (36.9 C)  TempSrc: Oral Oral  Oral  SpO2: 100% 100% 100% 100%  Weight:      Height:       Temp (24hrs), Avg:98.3 F (36.8 C), Min:97.7 F (36.5 C), Max:98.5 F (36.9 C)  CBC Latest Ref Rng & Units 10/09/2019 10/06/2019 10/05/2019  WBC 4.0 - 10.5 K/uL 11.7(H) 11.8(H) 11.2(H)  Hemoglobin 12.0 - 15.0 g/dL 11.8(L) 12.5 13.2  Hematocrit 36.0 - 46.0 % 36.5 39.0 40.9  Platelets 150 - 400 K/uL 729(H) 674(H) 639(H)   BMP Latest Ref Rng & Units 10/12/2019 10/09/2019 10/06/2019  Glucose 70 - 99 mg/dL 10/08/2019) 95 169(C)  BUN 6 - 20 mg/dL 10 7 8   Creatinine 0.44 - 1.00 mg/dL 789(F 8.10  Sodium 135 - 145 mmol/L 135 136 135  Potassium 3.5 - 5.1 mmol/L 4.2 4.4 3.8  Chloride 98 - 111 mmol/L 95(L) 97(L) 98  CO2 22 - 32 mmol/L 29 29 24   Calcium 8.9 - 10.3 mg/dL 8.9 9.0 8.9    Intake/Output Summary (Last 24 hours) at 10/12/2019 0934 Last data filed at 10/12/2019 0600 Gross per 24 hour  Intake 240 ml  Output 1300 ml  Net -1060 ml    Current Facility-Administered Medications:  .  0.9 %  sodium chloride infusion, 250 mL, Intravenous, Continuous, Keela Rubert A, MD, Last Rate: 1 mL/hr at 10/09/19 2024, 250 mL at 10/09/19 2024 .  acetaminophen (TYLENOL) tablet 650 mg, 650 mg, Oral, Q4H PRN, 650 mg at 10/03/19 1430 **OR** acetaminophen (TYLENOL) suppository 650 mg, 650 mg, Rectal, Q4H PRN, 10/11/19, MD .  Chlorhexidine Gluconate Cloth 2 % PADS 6 each, 6 each, Topical, Q0600, 2025, MD, 6 each at 10/12/19 [phone] .  cyclobenzaprine (FLEXERIL) tablet 10 mg, 10 mg, Oral, TID PRN, Leatha Gilding,  MD, 10 mg at 10/11/19 1024 .  enoxaparin (LOVENOX) injection 40 mg, 40 mg, Subcutaneous, Q24H, 5852, MD, 40 mg at 10/10/19 2347 .  feeding supplement (ENSURE ENLIVE) (ENSURE ENLIVE) liquid 237 mL, 237 mL, Oral, BID BM, 10/13/19, MD, 237 mL at 10/12/19 0846 .  HYDROmorphone (DILAUDID) injection 1 mg, 1 mg, Intravenous, Q4H PRN, 10/12/19 Tublu, MD, 1 mg at 10/12/19 0559 .  lidocaine (LIDODERM) 5 % 1 patch, 1 patch, Transdermal, Daily, 10/14/19 U, DO, 1 patch at 10/12/19 0845 .  menthol-cetylpyridinium (CEPACOL) lozenge 3 mg, 1 lozenge, Oral, PRN **OR** phenol (CHLORASEPTIC) mouth spray 1 spray, 1 spray, Mouth/Throat, PRN, Kayvion Arneson A, MD .  metoprolol tartrate (LOPRESSOR) injection 2.5 mg, 2.5 mg, Intravenous, Q8H PRN, 10/14/19, MD .  multivitamin with minerals tablet 1 tablet, 1 tablet, Oral, Daily, Marlin Canary, MD, 1 tablet at 10/12/19 0845 .  mupirocin ointment (BACTROBAN) 2 %, , Nasal, BID, Albertine Grates, DO, Given at 10/12/19 0846 .  nicotine (NICODERM CQ - dosed in mg/24 hours) patch 21 mg, 21 mg, Transdermal, Daily, 10/14/19, Rondell A, MD, 21 mg at 10/12/19 0845 .  ondansetron (ZOFRAN) tablet 4 mg, 4 mg, Oral, Q6H PRN **OR** ondansetron (ZOFRAN) injection 4 mg, 4 mg, Intravenous,  Q6H PRN, Judith Part, MD .  oxyCODONE (Oxy IR/ROXICODONE) immediate release tablet 15 mg, 15 mg, Oral, Q3H PRN, Florencia Reasons, MD, 15 mg at 10/12/19 0844 .  polyethylene glycol (MIRALAX / GLYCOLAX) packet 17 g, 17 g, Oral, Daily PRN, Siera Beyersdorf A, MD .  rifampin (RIFADIN) capsule 300 mg, 300 mg, Oral, Q12H, Michel Bickers, MD, 300 mg at 10/12/19 0845 .  senna-docusate (Senokot-S) tablet 1 tablet, 1 tablet, Oral, BID, Florencia Reasons, MD, 1 tablet at 10/11/19 2201 .  sodium chloride flush (NS) 0.9 % injection 3 mL, 3 mL, Intravenous, Q12H, Zacariah Belue, Joyice Faster, MD, 3 mL at 10/12/19 0846 .  sodium chloride flush (NS) 0.9 % injection 3 mL, 3 mL, Intravenous, PRN, Judith Part, MD .  vancomycin (VANCOCIN) IVPB 1000 mg/200 mL premix, 1,000 mg, Intravenous, Q12H, Florencia Reasons, MD, Last Rate: 200 mL/hr at 10/12/19 0855, 1,000 mg at 10/12/19 0855   Physical Exam: AOx3, strength grossly 5/5x4 but pain limited proximally in BUE, mild fingertip numbness bilaterally Incision c/d/i  Assessment & Plan: 39 y.o. woman w/ polysubstance abuse, severe neck pain, cervical epidural abscess, 1/12 s/p C5-6 ACDF for abscess evacuation. Gram stain w/ GPCs in clusters. Echo w/ no vegetations  -pt now 2 weeks post-op, can use today as her 2 week post-op visit, discussed usual course with her, neck pain will likely persist until infection is fully treated, can wear a soft collar for comfort, may help with neck pain, can wear prn. I do not think I can contribute anything further to her care at this point and she is likely to remain inpatient for an extended period for continued ABx Tx, will sign off. But I am happy to answer any questions/concerns or re-evaluate her as needed.  Judith Part  10/12/19 9:34 AM

## 2019-10-12 NOTE — Progress Notes (Signed)
[facility] PROGRESS NOTE    Progress Note  Colleen Ramirez  JOA:[id number] DOB: Mar 03, 1981 DOA: 09/29/2019 PCP: Patient, No Pcp Per     Brief Narrative:   Colleen Ramirez is an 39 y.o. female past medical history of IV drugs (methamphetamine) anemia and satiety status post gastric bypass and homeless who presents with a 2-day history of severe sharp neck pain constant.  She was just recently hospitalized and discharged on 08/25/2019 for facial cellulitis was on doxycycline twice daily for which she completed a 10-day course and clindamycin topical.  She underwent ACDF by neurosurgery on 09/29/2019.  Intraperitoneal culture grew MRSA she is currently on IV vancomycin and rifampin.  Will likely remain inpatient for the duration of her IV therapy due to her IVDU.  Assessment/Plan:   Sepsis due to cervical MRSA epidural abscess MRI of C-spine showed discitis and osteomyelitis of C5-C6, C6 and C7 and pronounced paravertebral/retropharyngeal soft tissue phlegmon with abscess formation. She underwent ACDF by neurosurgery in 09/29/2019, intraoperative culture grew MRSA. Infectious disease was consulted recommended IV Vanco and rifampin and plan for 6 weeks of IV antibiotics from day of surgery of 09/29/2019. She is not a candidate for outpatient IV antibiotics with a PICC line due to her concurrent IV drug abuse. As per previous physician she tends to be oversedated.  Decrease the IV narcotics increase her oral narcotics.  I had a long conversation with the patient about decreasing her IV narcotics and she became significantly emotional started swearing and being belligerent. He is not a candidate for IV antibiotics for 6 weeks with a PICC line at home.  Sinus tachycardia : Likely reactive.  Polysubstance abuse/IVDU: The methamphetamines injected into her arm. Worker has been consulted.  HSV positivity: Per infectious disease will need to follow-up with them as an outpatient.  History of  depression   DVT prophylaxis: lovenox Family Communication:none Disposition Plan/Barrier to D/C: Once she is off her IV antibiotics. Code Status:     Code Status Orders  (From admission, onward)         Start     Ordered   09/29/19 2336  Full code  Continuous     09/29/19 2335        Code Status History    Date Active Date Inactive Code Status Order ID Comments User Context   08/18/2019 1444 08/25/2019 1517 Full Code [id number]  Jonah Blue, MD Inpatient   07/24/2019 1114 07/31/2019 1404 Full Code [id number]  Alba Cory, MD Inpatient   Advance Care Planning Activity        IV Access:    Peripheral IV   Procedures and diagnostic studies:   No results found.   Medical Consultants:    None.  Anti-Infectives:   IV bank  Subjective:    Colleen Ramirez patient emotional and belligerent.  Objective:    Vitals:   10/11/19 2037 10/12/19 0446 10/12/19 0843 10/12/19 0845  BP: 109/77 111/76  (!) 122/92  Pulse: (!) 108 99 96 92  Resp: 16 20  17   Temp: 97.7 F (36.5 C) 98.5 F (36.9 C)  98.4 F (36.9 C)  TempSrc: Oral Oral  Oral  SpO2: 100% 100% 100% 100%  Weight:      Height:       SpO2: 100 %   Intake/Output Summary (Last 24 hours) at 10/12/2019 0934 Last data filed at 10/12/2019 0600 Gross per 24 hour  Intake 240 ml  Output 1300 ml  Net -1060 ml   10/14/2019  09/29/19 2032  Weight: 49.9 kg    Exam: General exam: In no acute distress. Respiratory system: Good air movement and clear to auscultation. Cardiovascular system: S1 & S2 heard, RRR. No JVD. Gastrointestinal system: Abdomen is nondistended, soft and nontender.  Central nervous system: Alert and oriented.  She relates some numbness in her upper extremities Extremities: No pedal edema. Skin: No rashes, lesions or ulcers Psychiatry: Patient with poor insight very emotional and labile affect.  Data Reviewed:    Labs: Basic Metabolic Panel: Recent Labs  Lab 10/06/19  0752 10/06/19 0752 10/09/19 0530 10/12/19 0621  NA 135  --  136 135  K 3.8   < > 4.4 4.2  CL 98  --  97* 95*  CO2 24  --  29 29  GLUCOSE 147*  --  95 120*  BUN 8  --  7 10  CREATININE 0.60  --  0.57 0.59  CALCIUM 8.9  --  9.0 8.9   < > = values in this interval not displayed.   GFR Estimated Creatinine Clearance: 74.4 mL/min (by C-G formula based on SCr of 0.59 mg/dL). Liver Function Tests: Recent Labs  Lab 10/11/19 0616  AST 18  ALT 19  ALKPHOS 82  BILITOT 0.4  PROT 6.9  ALBUMIN 2.5*   No results for input(s): LIPASE, AMYLASE in the last 168 hours. No results for input(s): AMMONIA in the last 168 hours. Coagulation profile No results for input(s): INR, PROTIME in the last 168 hours. COVID-19 Labs  No results for input(s): DDIMER, FERRITIN, LDH, CRP in the last 72 hours.  Lab Results  Component Value Date   SARSCOV2NAA NEGATIVE 09/29/2019   SARSCOV2NAA NEGATIVE 08/18/2019   SARSCOV2NAA NEGATIVE 07/24/2019    CBC: Recent Labs  Lab 10/06/19 0752 10/09/19 0530  WBC 11.8* 11.7*  NEUTROABS  --  8.1*  HGB 12.5 11.8*  HCT 39.0 36.5  MCV 87.2 86.9  PLT 674* 729*   Cardiac Enzymes: No results for input(s): CKTOTAL, CKMB, CKMBINDEX, TROPONINI in the last 168 hours. BNP (last 3 results) No results for input(s): PROBNP in the last 8760 hours. CBG: No results for input(s): GLUCAP in the last 168 hours. D-Dimer: No results for input(s): DDIMER in the last 72 hours. Hgb A1c: No results for input(s): HGBA1C in the last 72 hours. Lipid Profile: No results for input(s): CHOL, HDL, LDLCALC, TRIG, CHOLHDL, LDLDIRECT in the last 72 hours. Thyroid function studies: No results for input(s): TSH, T4TOTAL, T3FREE, THYROIDAB in the last 72 hours.  Invalid input(s): FREET3 Anemia work up: No results for input(s): VITAMINB12, FOLATE, FERRITIN, TIBC, IRON, RETICCTPCT in the last 72 hours. Sepsis Labs: Recent Labs  Lab 10/06/19 0752 10/09/19 0530  WBC 11.8* 11.7*    Microbiology No results found for this or any previous visit (from the past 240 hour(s)).   Medications:   . Chlorhexidine Gluconate Cloth  6 each Topical Q0600  . enoxaparin (LOVENOX) injection  40 mg Subcutaneous Q24H  . feeding supplement (ENSURE ENLIVE)  237 mL Oral BID BM  . lidocaine  1 patch Transdermal Daily  . multivitamin with minerals  1 tablet Oral Daily  . mupirocin ointment   Nasal BID  . nicotine  21 mg Transdermal Daily  . rifampin  300 mg Oral Q12H  . senna-docusate  1 tablet Oral BID  . sodium chloride flush  3 mL Intravenous Q12H   Continuous Infusions: . sodium chloride 250 mL (10/09/19 2024)  . vancomycin 1,000 mg (10/12/19 0855)  LOS: 13 days   Charlynne Cousins  [facility]  10/12/2019, 9:34 AM

## 2019-10-12 NOTE — Progress Notes (Signed)
Occupational Therapy Treatment Patient Details Name: Colleen Ramirez MRN: [id number] DOB: May 13, 1981 Today's Date: 10/12/2019    History of present illness Colleen Ramirez is a 39 y.o. F admitted to the ED with c/o severe neck pain secondary to a cervical epidural abscess. She is s/p C5-6 anterior cervical discectomy and instrumented fusion. PMH: IVDU, anemia, anxiety, depression, recent admission for facial cellulitis.    OT comments  Pt making good progress with functional goals, tx session focused on ADLs, toilet transfers, toileting and FMC/strenghtinen exercises of hands to improve ADL function. OT will continue to follow acutely  Follow Up Recommendations  SNF;Supervision/Assistance - 24 hour    Equipment Recommendations  None recommended by OT    Recommendations for Other Services      Precautions / Restrictions Precautions Precautions: Fall;Cervical Precaution Comments: reviewed cervical precautions with pt Restrictions Weight Bearing Restrictions: No       Mobility Bed Mobility Overal bed mobility: Modified Independent Bed Mobility: Rolling;Sidelying to Sit Rolling: Supervision Sidelying to sit: Supervision          Transfers Overall transfer level: Needs assistance Equipment used: None Transfers: Sit to/from Stand Sit to Stand: Modified independent (Device/Increase time)              Balance Overall balance assessment: Mild deficits observed, not formally tested                                         ADL either performed or assessed with clinical judgement   ADL Overall ADL's : Needs assistance/impaired     Grooming: Wash/dry hands;Wash/dry face;Supervision/safety;Set up;Standing           Upper Body Dressing : Set up;Sitting   Lower Body Dressing: Supervision/safety;Sitting/lateral leans Lower Body Dressing Details (indicate cue type and reason): pt donned socks while long sitting in bed Toilet Transfer: Modified  Independent;Ambulation   Toileting- Clothing Manipulation and Hygiene: Sit to/from stand;Modified independent   Tub/ Shower Transfer: Modified independent;Ambulation   Functional mobility during ADLs: Modified independent General ADL Comments: pt declined bathing activity in bathroom at sink this session     Vision Baseline Vision/History: No visual deficits Patient Visual Report: No change from baseline     Perception     Praxis      Cognition Arousal/Alertness: Awake/alert Behavior During Therapy: WFL for tasks assessed/performed;Restless Overall Cognitive Status: Within Functional Limits for tasks assessed                                          Exercises Other Exercises Other Exercises: issued yellow theraputty to pt and instructed pt on FMC/strenthening exercises; pt able to return demo   Shoulder Instructions       General Comments      Pertinent Vitals/ Pain       Pain Assessment: 0-10 Pain Score: 5  Pain Location: neck Pain Descriptors / Indicators: Aching;Discomfort;Sharp Pain Intervention(s): Monitored during session;Premedicated before session;Repositioned  Home Living                                          Prior Functioning/Environment              Frequency  Min 2X/week  Progress Toward Goals  OT Goals(current goals can now be found in the care plan section)  Progress towards OT goals: Progressing toward goals  ADL Goals Pt Will Transfer to Toilet: Independently Additional ADL Goal #1: Pt will tolerate hand exercises with theraputty to improve FMC/strengthening for ADLs and ADL mobility  Plan Discharge plan remains appropriate    Co-evaluation                 AM-PAC OT "6 Clicks" Daily Activity     Outcome Measure   Help from another person eating meals?: None Help from another person taking care of personal grooming?: None Help from another person toileting, which includes  using toliet, bedpan, or urinal?: None Help from another person bathing (including washing, rinsing, drying)?: A Little Help from another person to put on and taking off regular upper body clothing?: None Help from another person to put on and taking off regular lower body clothing?: A Little 6 Click Score: 22    End of Session    OT Visit Diagnosis: Unsteadiness on feet (R26.81);Muscle weakness (generalized) (M62.81);Pain Pain - part of body: (neck)   Activity Tolerance Patient tolerated treatment well   Patient Left with call bell/phone within reach;in chair   Nurse Communication          Time: 8280-0349 OT Time Calculation (min): 33 min  Charges: OT General Charges $OT Visit: 1 Visit OT Treatments $Self Care/Home Management : 8-22 mins $Therapeutic Exercise: 8-22 mins   Galen Manila 10/12/2019, 3:28 PM

## 2019-10-12 NOTE — Plan of Care (Signed)
  Problem: Pain Managment: Goal: General experience of comfort will improve Outcome: Progressing   

## 2019-10-13 LAB — VANCOMYCIN, TROUGH: Vancomycin Tr: 9 ug/mL — ABNORMAL LOW (ref 15–20)

## 2019-10-13 MED ORDER — VANCOMYCIN HCL 1.25 G IV SOLR
1250.0000 mg | Freq: Two times a day (BID) | INTRAVENOUS | Status: DC
  Filled 2019-10-13 (×2): qty 1250

## 2019-10-13 MED ORDER — VANCOMYCIN HCL 1.25 G IV SOLR
1250.0000 mg | Freq: Two times a day (BID) | INTRAVENOUS | Status: DC
  Administered 2019-10-13 (×2): 1250 mg via INTRAVENOUS
  Filled 2019-10-13 (×3): qty 1250

## 2019-10-13 NOTE — Plan of Care (Signed)

## 2019-10-13 NOTE — Progress Notes (Signed)
Pharmacy Antibiotic Note  Colleen Ramirez is a 39 y.o. female admitted on 09/29/2019 with MRSA cervical spine infection. Pharmacy has been consulted for Vancomycin dosing. Also on rifampin. Renal function remains stable.  Vancomycin peak 24, trough of 9 calculated AUC 420.2. Pt specific kinetics: Vd: 42.1L, Ke: 0.1132 T 1/2 6.1 hours  Patient still within the goal AUC on 1000 mg q12h but her levels are decreasing from prior levels. Will increase her dose to 1250 mg. Calculated AUC on 1250 mg Q12h is 524.  Plan: - Increase Vancomycin to 1250 mg IV every 12 hours - Rifampin 300mg  PO q12h - Watch renal function. - ID planning 6 weeks of treatment  Height: 5\' 3"  (160 cm) Weight: 110 lb (49.9 kg) IBW/kg (Calculated) : 52.4  Temp (24hrs), Avg:98 F (36.7 C), Min:97.8 F (36.6 C), Max:98.2 F (36.8 C)  Recent Labs  Lab 10/09/19 0530 10/09/19 0530 10/09/19 1005 10/12/19 0621 10/12/19 2242 10/13/19 0722  WBC 11.7*  --   --   --   --   --   CREATININE 0.57  --   --  0.59  --   --   VANCOTROUGH 14*  --   --   --   --  9*  VANCOPEAK 15*   < > 29*  --  24*  --    < > = values in this interval not displayed.    Estimated Creatinine Clearance: 74.4 mL/min (by C-G formula based on SCr of 0.59 mg/dL).    No Known Allergies  Antimicrobials this admission: Zosyn 1/12 >> 1/13 Vanc 1/12 >> Rifampin 1/14 >>  Dose adjustments this admission: 1/16 & 1/17: 21 and <4 - 1g x 1 and adj to 750 mg/8h 1/19: VP 32, VT 11, AUC 521 - no change 1/22 VP/VT 29/14 AUC 542 - adjusted to 1000mg  q12h 1/26: VP/VT 24/9 AUC 420 - adjusted to 1250 mg q12h  Microbiology results: 1/12 abscess cx - MRSA  , PharmD PGY1 Pharmacy Resident Phone: [phone] 10/13/2019  9:07 AM  Please check [email or web address] for unit-specific pharmacy phone numbers.

## 2019-10-13 NOTE — Progress Notes (Signed)
Nutrition Follow-up  DOCUMENTATION CODES:   Not applicable  INTERVENTION:   -Continue Ensure Enlive po BID, each supplement provides 350 kcal and 20 grams of protein -Continue MVI with minerals daily  NUTRITION DIAGNOSIS:   Increased nutrient needs related to post-op healing as evidenced by estimated needs.  Ongoing  GOAL:   Patient will meet greater than or equal to 90% of their needs  Progressing   MONITOR:   PO intake, Supplement acceptance, Labs, Weight trends, Skin, I & O's  REASON FOR ASSESSMENT:   Malnutrition Screening Tool    ASSESSMENT:   This is a 39 y.o. woman that presents with severe neck pain in the setting of IVDU w/ methamphetamine. She also has a h/o anemia, anxiety, and depression, recent admission for facial cellulitis. She has had 2 days of severe bilateral neck / interscapular and bilateral arm pain. She is currently in severe pain and is not a detailed historian. She does endorse that the pain is sharp in quality, severe in intensity, worse with movement but present at rest. She denies any new weakness, but has pain limited strength in the BUE. She does endorse some bilateral hand numbness but cannot tell which fingers. No recent change in bowel or bladder function. No recent use of anti-platelet or anti-coagulant medications.  1/12- s/pPROCEDURE:C5-C6Anterior Cervical Discectomy and Instrumented Fusion  Reviewed I/O's: -1.1 L x 24 hours and +2.8 L since admission  UOP: 1.1 L x 24 hours  Attempted to speak with pt via phone, however, phone line busy.   Pt remains with good appetite; noted meal completion 100%. Pt is consuming Ensure supplements per MAR.   No new wt to assess at this time.   Medications reviewed and include senna and vancomycin.   Pt will remain in the hospital for completion of IV antibiotics.   Labs reviewed.   Diet Order:   Diet Order            Diet regular Room service appropriate? Yes; Fluid consistency: Thin   Diet effective now              EDUCATION NEEDS:   No education needs have been identified at this time  Skin:  Skin Assessment: Skin Integrity Issues: Skin Integrity Issues:: Incisions Incisions: closed neck  Last BM:  10/11/19  Height:   Ht Readings from Last 1 Encounters:  09/29/19 5\' 3"  (1.6 m)    Weight:   Wt Readings from Last 1 Encounters:  09/29/19 49.9 kg    Ideal Body Weight:  52.2 kg  BMI:  Body mass index is 19.49 kg/m.  Estimated Nutritional Needs:   Kcal:  1750-1950  Protein:  85-100 grams  Fluid:  > 1.7 L    Xyler Terpening A. 11/27/19, RD, LDN, CDCES Registered Dietitian II Certified Diabetes Care and Education Specialist Pager: [phone] After hours Pager: [phone]

## 2019-10-13 NOTE — Progress Notes (Addendum)
[facility] PROGRESS NOTE    Progress Note  Colleen Ramirez  OZH:[id number] DOB: 1981/02/29 DOA: 09/29/2019 PCP: Patient, No Pcp Per     Brief Narrative:   Colleen Ramirez is an 39 y.o. female past medical history of IV drugs (methamphetamine) anemia and satiety status post gastric bypass and homeless who presents with a 2-day history of severe sharp neck pain constant.  She was just recently hospitalized and discharged on 08/25/2019 for facial cellulitis was on doxycycline twice daily for which she completed a 10-day course and clindamycin topical.  She underwent ACDF by neurosurgery on 09/29/2019.  Intraperitoneal culture grew MRSA she is currently on IV vancomycin and rifampin.  Will likely remain inpatient for the duration of her IV therapy due to her IVDU.  Assessment/Plan:   Sepsis due to cervical MRSA epidural abscess MRI of C-spine showed discitis and osteomyelitis of C5-C6, C6 and C7 and pronounced paravertebral/retropharyngeal soft tissue phlegmon with abscess formation. She underwent ACDF by neurosurgery in 09/29/2019, intraoperative culture grew MRSA. Infectious disease was consulted recommended IV Vanco and rifampin and plan for 6 weeks of IV antibiotics from day of surgery of 09/29/2019. She is not a candidate for outpatient IV antibiotics with a PICC line due to her concurrent IV drug abuse. As per previous physician she tends to be oversedated. Cont current narcotic regimen. Patient again became very emotional belligerent and raising her voice. He is not a candidate for IV antibiotics for 6 weeks with a PICC line at home.  Sinus tachycardia : Likely reactive.  Narcotic seeking behavior polysubstance abuse/IVDU: The methamphetamines injected into her arm. Worker has been consulted.  HSV positivity: Per infectious disease will need to follow-up with them as an outpatient.  History of depression   DVT prophylaxis: lovenox Family Communication:none Disposition  Plan/Barrier to D/C: Once she is off her IV antibiotics. Code Status:     Code Status Orders  (From admission, onward)         Start     Ordered   09/29/19 2336  Full code  Continuous     09/29/19 2335        Code Status History    Date Active Date Inactive Code Status Order ID Comments User Context   08/18/2019 1444 08/25/2019 1517 Full Code [id number]  Karmen Bongo, MD Inpatient   07/24/2019 1114 07/31/2019 1404 Full Code [id number]  Elmarie Shiley, MD Inpatient   Advance Care Planning Activity        IV Access:    Peripheral IV   Procedures and diagnostic studies:   No results found.   Medical Consultants:    None.  Anti-Infectives:   IV bank  Subjective:    Colleen Ramirez patient emotional and belligerent.  Objective:    Vitals:   10/12/19 1543 10/12/19 1924 10/13/19 0343 10/13/19 0751  BP: 112/77 113/84 120/85 122/79  Pulse: (!) 109 98 88 94  Resp: 17 18 17 18   Temp: 98.2 F (36.8 C) 98 F (36.7 C) 98.1 F (36.7 C) 97.8 F (36.6 C)  TempSrc: Oral Oral Oral Oral  SpO2: 100% 100% 98% 97%  Weight:      Height:       SpO2: 97 %   Intake/Output Summary (Last 24 hours) at 10/13/2019 0833 Last data filed at 10/12/2019 1500 Gross per 24 hour  Intake --  Output 1100 ml  Net -1100 ml   Filed Weights   09/29/19 2032  Weight: 49.9 kg    Exam: General exam: In  no acute distress. Respiratory system: Good air movement and clear to auscultation. Cardiovascular system: S1 & S2 heard, RRR. No JVD. Gastrointestinal system: Abdomen is nondistended, soft and nontender.  Central nervous system: Alert and oriented. No focal neurological deficits. Extremities: No pedal edema. Skin: No rashes, lesions or ulcers  Data Reviewed:    Labs: Basic Metabolic Panel: Recent Labs  Lab 10/09/19 0530 10/12/19 0621  NA 136 135  K 4.4 4.2  CL 97* 95*  CO2 29 29  GLUCOSE 95 120*  BUN 7 10  CREATININE 0.57 0.59  CALCIUM 9.0 8.9    GFR Estimated Creatinine Clearance: 74.4 mL/min (by C-G formula based on SCr of 0.59 mg/dL). Liver Function Tests: Recent Labs  Lab 10/11/19 0616  AST 18  ALT 19  ALKPHOS 82  BILITOT 0.4  PROT 6.9  ALBUMIN 2.5*   No results for input(s): LIPASE, AMYLASE in the last 168 hours. No results for input(s): AMMONIA in the last 168 hours. Coagulation profile No results for input(s): INR, PROTIME in the last 168 hours. COVID-19 Labs  No results for input(s): DDIMER, FERRITIN, LDH, CRP in the last 72 hours.  Lab Results  Component Value Date   SARSCOV2NAA NEGATIVE 09/29/2019   SARSCOV2NAA NEGATIVE 08/18/2019   SARSCOV2NAA NEGATIVE 07/24/2019    CBC: Recent Labs  Lab 10/09/19 0530  WBC 11.7*  NEUTROABS 8.1*  HGB 11.8*  HCT 36.5  MCV 86.9  PLT 729*   Cardiac Enzymes: No results for input(s): CKTOTAL, CKMB, CKMBINDEX, TROPONINI in the last 168 hours. BNP (last 3 results) No results for input(s): PROBNP in the last 8760 hours. CBG: No results for input(s): GLUCAP in the last 168 hours. D-Dimer: No results for input(s): DDIMER in the last 72 hours. Hgb A1c: No results for input(s): HGBA1C in the last 72 hours. Lipid Profile: No results for input(s): CHOL, HDL, LDLCALC, TRIG, CHOLHDL, LDLDIRECT in the last 72 hours. Thyroid function studies: No results for input(s): TSH, T4TOTAL, T3FREE, THYROIDAB in the last 72 hours.  Invalid input(s): FREET3 Anemia work up: No results for input(s): VITAMINB12, FOLATE, FERRITIN, TIBC, IRON, RETICCTPCT in the last 72 hours. Sepsis Labs: Recent Labs  Lab 10/09/19 0530  WBC 11.7*   Microbiology No results found for this or any previous visit (from the past 240 hour(s)).   Medications:   . Chlorhexidine Gluconate Cloth  6 each Topical Q0600  . enoxaparin (LOVENOX) injection  40 mg Subcutaneous Q24H  . feeding supplement (ENSURE ENLIVE)  237 mL Oral BID BM  . lidocaine  1 patch Transdermal Daily  . multivitamin with minerals   1 tablet Oral Daily  . nicotine  21 mg Transdermal Daily  . rifampin  300 mg Oral Q12H  . senna-docusate  1 tablet Oral BID  . sodium chloride flush  3 mL Intravenous Q12H   Continuous Infusions: . sodium chloride 250 mL (10/09/19 2024)  . vancomycin 1,000 mg (10/12/19 1939)      LOS: 14 days   Marinda Elk  [facility]  10/13/2019, 8:33 AM

## 2019-10-14 LAB — CBC WITH DIFFERENTIAL/PLATELET
Abs Immature Granulocytes: 0.05 10*3/uL (ref 0.00–0.07)
Basophils Absolute: 0.1 10*3/uL (ref 0.0–0.1)
Basophils Relative: 1 %
Eosinophils Absolute: 0.2 10*3/uL (ref 0.0–0.5)
Eosinophils Relative: 2 %
HCT: 35.9 % — ABNORMAL LOW (ref 36.0–46.0)
Hemoglobin: 11.5 g/dL — ABNORMAL LOW (ref 12.0–15.0)
Immature Granulocytes: 1 %
Lymphocytes Relative: 27 %
Lymphs Abs: 2.4 10*3/uL (ref 0.7–4.0)
MCH: 28.4 pg (ref 26.0–34.0)
MCHC: 32 g/dL (ref 30.0–36.0)
MCV: 88.6 fL (ref 80.0–100.0)
Monocytes Absolute: 0.7 10*3/uL (ref 0.1–1.0)
Monocytes Relative: 8 %
Neutro Abs: 5.6 10*3/uL (ref 1.7–7.7)
Neutrophils Relative %: 61 %
Platelets: 638 10*3/uL — ABNORMAL HIGH (ref 150–400)
RBC: 4.05 MIL/uL (ref 3.87–5.11)
RDW: 15.5 % (ref 11.5–15.5)
WBC: 8.9 10*3/uL (ref 4.0–10.5)
nRBC: 0 % (ref 0.0–0.2)

## 2019-10-14 MED ORDER — VANCOMYCIN HCL 1250 MG/250ML IV SOLN
1250.0000 mg | Freq: Two times a day (BID) | INTRAVENOUS | Status: DC
  Administered 2019-10-14 – 2019-10-27 (×28): 1250 mg via INTRAVENOUS
  Filled 2019-10-14 (×30): qty 250

## 2019-10-14 NOTE — Progress Notes (Signed)
Occupational Therapy Treatment Patient Details Name: Colleen Ramirez MRN: [id number] DOB: 01-06-1981 Today's Date: 10/14/2019    History of present illness Colleen Ramirez is a 39 y.o. F admitted to the ED with c/o severe neck pain secondary to a cervical epidural abscess. She is s/p C5-6 anterior cervical discectomy and instrumented fusion. PMH: IVDU, anemia, anxiety, depression, recent admission for facial cellulitis.    OT comments  Pt making good progress with functional goals. Session focused on ADLs, ADL mobility and digit/hand FMC and strengthening exercises with level 1 theraputty. Pt reports that she has not done exercises since last OT session and OT encouraged theraputty exercises daily to improve her functional performance and independence. Pt has progressed enough to not require any skilled OT follow-up services, therefore d/c recommendations and goals have been updated.  OT will continue to follow acutely  Follow Up Recommendations  No OT follow up    Equipment Recommendations  Other (comment)(reacher)    Recommendations for Other Services      Precautions / Restrictions Precautions Precautions: Fall;Cervical Precaution Comments: reviewed cervical precautions with pt Required Braces or Orthoses: Cervical Brace Cervical Brace: Soft collar;At all times Restrictions Weight Bearing Restrictions: No       Mobility Bed Mobility Overal bed mobility: Modified Independent             General bed mobility comments: pt in recliner upon arrival  Transfers Overall transfer level: Needs assistance Equipment used: None Transfers: Sit to/from Stand Sit to Stand: Supervision         General transfer comment: supervision for safety, increased time to rise.    Balance Overall balance assessment: Mild deficits observed, not formally tested                                         ADL either performed or assessed with clinical judgement   ADL Overall  ADL's : Needs assistance/impaired Eating/Feeding: Set up;Independent;Sitting Eating/Feeding Details (indicate cue type and reason): assist with opening bottles and conatiners, condiment packets Grooming: Wash/dry hands;Wash/dry face;Supervision/safety;Standing           Upper Body Dressing : Set up;Supervision/safety;Sitting   Lower Body Dressing: Supervision/safety;Sitting/lateral leans;Sit to/from stand Lower Body Dressing Details (indicate cue type and reason): simulated Toilet Transfer: Modified Independent;Ambulation   Toileting- Clothing Manipulation and Hygiene: Sit to/from stand;Modified independent       Functional mobility during ADLs: Modified independent General ADL Comments: initiated ADL A/E education for using reacher due to cervical precautions     Vision Patient Visual Report: No change from baseline     Perception     Praxis      Cognition Arousal/Alertness: Awake/alert Behavior During Therapy: WFL for tasks assessed/performed Overall Cognitive Status: Within Functional Limits for tasks assessed                                          Exercises Other Exercises Other Exercises: Pt participated in level 1 (yellow) theraputty exercises at table top to improve digit/hand [place] and strengthening for ADLs and functional task   Shoulder Instructions       General Comments      Pertinent Vitals/ Pain       Pain Assessment: Faces Pain Score: 6  Faces Pain Scale: Hurts even more Pain Location: neck Pain  Descriptors / Indicators: Discomfort;Sore Pain Intervention(s): Monitored during session;Repositioned  Home Living                                          Prior Functioning/Environment              Frequency  Min 2X/week        Progress Toward Goals  OT Goals(current goals can now be found in the care plan section)  Progress towards OT goals: Progressing toward goals  Acute Rehab OT Goals Patient  Stated Goal: decrease pain ADL Goals Pt Will Perform Grooming: with modified independence;Independently Pt Will Perform Upper Body Dressing: with modified independence;Independently Pt Will Perform Lower Body Dressing: with modified independence;Independently Pt Will Transfer to Toilet: with modified independence;Independently Pt Will Perform Toileting - Clothing Manipulation and hygiene: with modified independence;Independently Additional ADL Goal #2: Pt will gather and put away ADL toiletry items with use sup - Mod I  Plan Discharge plan needs to be updated    Co-evaluation                 AM-PAC OT "6 Clicks" Daily Activity     Outcome Measure   Help from another person eating meals?: None Help from another person taking care of personal grooming?: None Help from another person toileting, which includes using toliet, bedpan, or urinal?: None Help from another person bathing (including washing, rinsing, drying)?: A Little Help from another person to put on and taking off regular upper body clothing?: None Help from another person to put on and taking off regular lower body clothing?: A Little 6 Click Score: 22    End of Session    OT Visit Diagnosis: Unsteadiness on feet (R26.81);Muscle weakness (generalized) (M62.81);Pain Pain - part of body: (neck)   Activity Tolerance Patient tolerated treatment well   Patient Left with call bell/phone within reach;in chair   Nurse Communication          Time: 1610-9604 OT Time Calculation (min): 26 min  Charges: OT General Charges $OT Visit: 1 Visit OT Treatments $Self Care/Home Management : 8-22 mins $Therapeutic Exercise: 8-22 mins     Galen Manila 10/14/2019, 2:24 PM

## 2019-10-14 NOTE — Progress Notes (Signed)
Physical Therapy Treatment Patient Details Name: Colleen Ramirez MRN: [id number] DOB: 17-Jan-1981 Today's Date: 10/14/2019    History of Present Illness Colleen Ramirez is a 39 y.o. F admitted to the ED with c/o severe neck pain secondary to a cervical epidural abscess. She is s/p C5-6 anterior cervical discectomy and instrumented fusion. PMH: IVDU, anemia, anxiety, depression, recent admission for facial cellulitis.     PT Comments    Pt making great progress with mobility and overall at a mod I to supervision level with functional mobility tasks. PT will continue to follow pt acutely to progress mobility as tolerated. However, pt has progressed enough to not need any skilled PT follow-up services, therefore d/c recommendations have been updated.      Follow Up Recommendations  No PT follow up     Equipment Recommendations  None recommended by PT    Recommendations for Other Services       Precautions / Restrictions Precautions Precautions: Fall;Cervical Required Braces or Orthoses: Cervical Brace Cervical Brace: Soft collar;At all times Restrictions Weight Bearing Restrictions: No    Mobility  Bed Mobility Overal bed mobility: Modified Independent                Transfers Overall transfer level: Needs assistance Equipment used: None Transfers: Sit to/from Stand Sit to Stand: Supervision         General transfer comment: supervision for safety, increased time to rise.  Ambulation/Gait Ambulation/Gait assistance: Supervision Gait Distance (Feet): 220 Feet Assistive device: None Gait Pattern/deviations: Step-through pattern;Decreased stride length Gait velocity: decreased   General Gait Details: supervision for safety, no LOB or need for physical assistance   Stairs             Wheelchair Mobility    Modified Rankin (Stroke Patients Only)       Balance Overall balance assessment: Mild deficits observed, not formally tested                                           Cognition Arousal/Alertness: Awake/alert Behavior During Therapy: WFL for tasks assessed/performed Overall Cognitive Status: Within Functional Limits for tasks assessed                                        Exercises      General Comments        Pertinent Vitals/Pain Pain Assessment: Faces Faces Pain Scale: Hurts even more Pain Location: neck Pain Descriptors / Indicators: Discomfort;Sore Pain Intervention(s): Monitored during session;Repositioned    Home Living                      Prior Function            PT Goals (current goals can now be found in the care plan section) Acute Rehab PT Goals PT Goal Formulation: With patient Time For Goal Achievement: 10/28/19 Potential to Achieve Goals: Good Progress towards PT goals: Progressing toward goals    Frequency    Min 3X/week      PT Plan Discharge plan needs to be updated;Frequency needs to be updated    Co-evaluation              AM-PAC PT "6 Clicks" Mobility   Outcome Measure  Help needed turning from your back to  your side while in a flat bed without using bedrails?: None Help needed moving from lying on your back to sitting on the side of a flat bed without using bedrails?: None Help needed moving to and from a bed to a chair (including a wheelchair)?: None Help needed standing up from a chair using your arms (e.g., wheelchair or bedside chair)?: None Help needed to walk in hospital room?: None Help needed climbing 3-5 steps with a railing? : A Little 6 Click Score: 23    End of Session Equipment Utilized During Treatment: Cervical collar Activity Tolerance: Patient limited by pain Patient left: in bed;with call bell/phone within reach Nurse Communication: Mobility status PT Visit Diagnosis: Unsteadiness on feet (R26.81);Other abnormalities of gait and mobility (R26.89);Muscle weakness (generalized) (M62.81);Pain Pain - part  of body: (neck)     Time: 9604-5409 PT Time Calculation (min) (ACUTE ONLY): 21 min  Charges:  $Gait Training: 8-22 mins                     Anastasio Champion, DPT  Acute Rehabilitation Services Pager [phone] Office [place] 10/14/2019, 1:16 PM

## 2019-10-14 NOTE — Progress Notes (Addendum)
[facility] PROGRESS NOTE    Progress Note  Colleen Ramirez  YWV:[id number] DOB: Feb 19, 1981 DOA: 09/29/2019 PCP: Patient, No Pcp Per     Brief Narrative:   Colleen Ramirez is an 39 y.o. female past medical history of IV drugs (methamphetamine) anemia and satiety status post gastric bypass and homeless who presents with a 2-day history of severe sharp neck pain constant.  She was just recently hospitalized and discharged on 08/25/2019 for facial cellulitis was on doxycycline twice daily for which she completed a 10-day course and clindamycin topical.  She underwent ACDF by neurosurgery on 09/29/2019. Intraperitoneal culture grew MRSA she is currently on IV vancomycin and rifampin and will likely remain inpatient for the duration of her IV therapy due to her IVDU history.  Assessment/Plan:   Sepsis due to cervical MRSA epidural abscess MRI of C-spine showed discitis and osteomyelitis of C5-C6, C6 and C7 and pronounced paravertebral/retropharyngeal soft tissue phlegmon with abscess formation. She underwent ACDF by neurosurgery in 09/29/2019, intraoperative culture grew MRSA. Infectious disease was consulted recommended IV Vanco and rifampin and plan for 6 weeks of IV antibiotics from day of surgery of 09/29/2019. She is not a candidate for outpatient IV antibiotics with a PICC line due to her concurrent IV drug abuse. As per previous physician she tends to be oversedated. Cont current narcotic regimen. Patient again became very emotional belligerent and raising her voice. She is not a candidate for IV antibiotics for 6 weeks with a PICC line at home.  Sinus tachycardia: Likely reactive.  Narcotic seeking behavior polysubstance abuse/IVDU: Current regimen: Dilaudid 0.5 mg q3h PRN, Toradol 30 mg q6h PRN, Lidoderm patch, Oxycodone 20 mg q3h PRN  HSV positivity: Per infectious disease will need to follow-up with them as an outpatient.  History of depression:  [place] Use: Nicotine patch  21 mg   DVT prophylaxis: lovenox Family Communication: none Disposition Plan/Barrier to D/C: Once she is off her IV antibiotics. PT/OT report no needs. Code Status:     Code Status Orders  (From admission, onward)         Start     Ordered   09/29/19 2336  Full code  Continuous     09/29/19 2335        Code Status History    Date Active Date Inactive Code Status Order ID Comments User Context   08/18/2019 1444 08/25/2019 1517 Full Code [id number]  Jonah Blue, MD Inpatient   07/24/2019 1114 07/31/2019 1404 Full Code [id number]  Alba Cory, MD Inpatient   Advance Care Planning Activity        IV Access:    Peripheral IV   Procedures and diagnostic studies:   No results found.   Medical Consultants:    None.  Anti-Infectives:   IV bank  Subjective:    Colleen Ramirez is resting in bed on her side. Pleasant. Reports a headache and does not want to move. She questions if her infection in her neck came from the one in her abdomen a few weeks ago. Discussed plan and no further questions.   Objective:    Vitals:   10/13/19 2016 10/14/19 0416 10/14/19 0911 10/14/19 1300  BP: 122/89 119/90 117/86 (!) 117/97  Pulse: (!) 109 91  (!) 102  Resp: 15 15    Temp: 98.3 F (36.8 C) 97.8 F (36.6 C)    TempSrc: Oral Oral    SpO2: 98% 100%  99%  Weight:      Height:  SpO2: 99 %   Intake/Output Summary (Last 24 hours) at 10/14/2019 1437 Last data filed at 10/14/2019 0300 Gross per 24 hour  Intake 509.59 ml  Output -  Net 509.59 ml   Filed Weights   09/29/19 2032  Weight: 49.9 kg    Exam: General exam: In no acute distress. AAOx4. Neck brace on. Respiratory system: Good air movement and clear to auscultation. Cardiovascular system: S1 & S2 heard, RRR. No JVD. Gastrointestinal system: Abdomen is nondistended, soft and nontender.  Central nervous system: Alert and oriented. No focal neurological deficits. Extremities: No pedal edema. Skin:  No rashes, lesions or ulcers  Data Reviewed:    Labs: Basic Metabolic Panel: Recent Labs  Lab 10/09/19 0530 10/12/19 0621  NA 136 135  K 4.4 4.2  CL 97* 95*  CO2 29 29  GLUCOSE 95 120*  BUN 7 10  CREATININE 0.57 0.59  CALCIUM 9.0 8.9   GFR Estimated Creatinine Clearance: 74.4 mL/min (by C-G formula based on SCr of 0.59 mg/dL). Liver Function Tests: Recent Labs  Lab 10/11/19 0616  AST 18  ALT 19  ALKPHOS 82  BILITOT 0.4  PROT 6.9  ALBUMIN 2.5*   No results for input(s): LIPASE, AMYLASE in the last 168 hours. No results for input(s): AMMONIA in the last 168 hours. Coagulation profile No results for input(s): INR, PROTIME in the last 168 hours. COVID-19 Labs  No results for input(s): DDIMER, FERRITIN, LDH, CRP in the last 72 hours.  Lab Results  Component Value Date   SARSCOV2NAA NEGATIVE 09/29/2019   [place] NEGATIVE 08/18/2019   [place] NEGATIVE 07/24/2019    CBC: Recent Labs  Lab 10/09/19 0530 10/14/19 0605  WBC 11.7* 8.9  NEUTROABS 8.1* 5.6  HGB 11.8* 11.5*  HCT 36.5 35.9*  MCV 86.9 88.6  PLT 729* 638*   Cardiac Enzymes: No results for input(s): CKTOTAL, CKMB, CKMBINDEX, TROPONINI in the last 168 hours. BNP (last 3 results) No results for input(s): PROBNP in the last 8760 hours. CBG: No results for input(s): GLUCAP in the last 168 hours. D-Dimer: No results for input(s): DDIMER in the last 72 hours. Hgb A1c: No results for input(s): HGBA1C in the last 72 hours. Lipid Profile: No results for input(s): CHOL, HDL, LDLCALC, TRIG, CHOLHDL, LDLDIRECT in the last 72 hours. Thyroid function studies: No results for input(s): TSH, T4TOTAL, T3FREE, THYROIDAB in the last 72 hours.  Invalid input(s): FREET3 Anemia work up: No results for input(s): VITAMINB12, FOLATE, FERRITIN, TIBC, IRON, RETICCTPCT in the last 72 hours. Sepsis Labs: Recent Labs  Lab 10/09/19 0530 10/14/19 0605  WBC 11.7* 8.9   Microbiology No results found for this or  any previous visit (from the past 240 hour(s)).   Medications:   . Chlorhexidine Gluconate Cloth  6 each Topical Q0600  . enoxaparin (LOVENOX) injection  40 mg Subcutaneous Q24H  . feeding supplement (ENSURE ENLIVE)  237 mL Oral BID BM  . lidocaine  1 patch Transdermal Daily  . multivitamin with minerals  1 tablet Oral Daily  . nicotine  21 mg Transdermal Daily  . rifampin  300 mg Oral Q12H  . senna-docusate  1 tablet Oral BID  . sodium chloride flush  3 mL Intravenous Q12H   Continuous Infusions: . sodium chloride 250 mL (10/09/19 2024)  . vancomycin 1,250 mg (10/14/19 0916)      LOS: 15 days   Chauncey Mann, MD  [facility]  10/14/2019, 2:37 PM

## 2019-10-14 NOTE — Plan of Care (Signed)

## 2019-10-14 NOTE — Plan of Care (Signed)

## 2019-10-15 LAB — BASIC METABOLIC PANEL
Anion gap: 9 (ref 5–15)
BUN: 10 mg/dL (ref 6–20)
CO2: 29 mmol/L (ref 22–32)
Calcium: 9.2 mg/dL (ref 8.9–10.3)
Chloride: 101 mmol/L (ref 98–111)
Creatinine, Ser: 0.61 mg/dL (ref 0.44–1.00)
GFR calc Af Amer: >60 mL/min (ref >60)
GFR calc non Af Amer: >60 mL/min (ref >60)
Glucose, Bld: 96 mg/dL (ref 70–99)
Potassium: 4.3 mmol/L (ref 3.5–5.1)
Sodium: 139 mmol/L (ref 135–145)

## 2019-10-15 LAB — HEPATITIS B SURFACE ANTIGEN: Hepatitis B Surface Ag: NONREACTIVE

## 2019-10-15 MED ORDER — TRAZODONE HCL 50 MG PO TABS
50.0000 mg | ORAL_TABLET | Freq: Every evening | ORAL | Status: DC | PRN
  Administered 2019-10-15: 50 mg via ORAL
  Filled 2019-10-15: qty 1

## 2019-10-15 NOTE — Progress Notes (Signed)
PT Cancellation Note  Patient Details Name: Colleen Ramirez MRN: [id number] DOB: [birth year]   Cancelled Treatment:    Reason Eval/Treat Not Completed: Other (comment). Attempted multiple times to see pt today; however, pt refusing x3 secondary to waiting on breakfast and waiting on pain meds. Then cancelled a fourth time as pt was having an IV placed with ultrasound. PT will continue to f/u with pt acutely as available.    Alessandra Bevels Carmine Youngberg 10/15/2019, 2:09 PM

## 2019-10-15 NOTE — Plan of Care (Signed)
  Problem: Education: Goal: Knowledge of General Education information will improve Description: Including pain rating scale, medication(s)/side effects and non-pharmacologic comfort measures Outcome: Progressing   Problem: Health Behavior/Discharge Planning: Goal: Ability to manage health-related needs will improve Outcome: Progressing   Problem: Coping: Goal: Level of anxiety will decrease Outcome: Progressing   Problem: Elimination: Goal: Will not experience complications related to bowel motility Outcome: Progressing Goal: Will not experience complications related to urinary retention Outcome: Progressing   

## 2019-10-15 NOTE — Progress Notes (Signed)
[facility] PROGRESS NOTE    Progress Note  Colleen Ramirez  JJO:[id number] DOB: 15-Aug-1981 DOA: 09/29/2019 PCP: Patient, No Pcp Per     Brief Narrative:   Colleen Ramirez is an 39 y.o. female past medical history of IV drugs (methamphetamine) anemia and satiety status post gastric bypass and homeless who presents with a 2-day history of severe sharp neck pain constant.  She was just recently hospitalized and discharged on 08/25/2019 for facial cellulitis was on doxycycline twice daily for which she completed a 10-day course and clindamycin topical.  She underwent ACDF by neurosurgery on 09/29/2019. Intraperitoneal culture grew MRSA she is currently on IV vancomycin and rifampin and will likely remain inpatient for the duration of her IV therapy due to her IVDU history.  Assessment/Plan:   Sepsis due to cervical MRSA epidural abscess MRI of C-spine showed discitis and osteomyelitis of C5-C6, C6 and C7 and pronounced paravertebral/retropharyngeal soft tissue phlegmon with abscess formation. She underwent ACDF by neurosurgery in 09/29/2019, intraoperative culture grew MRSA. Infectious disease was consulted recommended IV Vanco and rifampin and plan for 6 weeks of IV antibiotics from day of surgery of 09/29/2019. She is not a candidate for outpatient IV antibiotics with a PICC line due to her concurrent IV drug abuse. As per previous physician she tends to be oversedated.   Patient again requested escalation of her opioids but will continue current regimen for now.  She is not a candidate for IV antibiotics for 6 weeks with a PICC line at home.  Sinus tachycardia: Likely reactive.  Narcotic seeking behavior polysubstance abuse/IVDU: Current regimen: Dilaudid 0.5 mg q3h PRN, Toradol 30 mg q6h PRN, Lidoderm patch, Oxycodone 20 mg q3h PRN  HSV positivity: Per infectious disease will need to follow-up with them as an outpatient.  History of depression:  Tobaccos Use: Nicotine patch 21  mg  Insomnia:  Will start trazodone hs as needed.   History of depression/anxiety:  Reports being on psych meds at home.  Home med rec not done from admission, requested pharmacy consult.   DVT prophylaxis: lovenox Family Communication: none Disposition Plan/Barrier to D/C: Once she is off her IV antibiotics. PT/OT report no needs. Code Status:     Code Status Orders  (From admission, onward)         Start     Ordered   09/29/19 2336  Full code  Continuous     09/29/19 2335        Code Status History    Date Active Date Inactive Code Status Order ID Comments User Context   08/18/2019 1444 08/25/2019 1517 Full Code [id number]  Jonah Blue, MD Inpatient   07/24/2019 1114 07/31/2019 1404 Full Code [id number]  Alba Cory, MD Inpatient   Advance Care Planning Activity        IV Access:    Peripheral IV   Procedures and diagnostic studies:   No results found.   Medical Consultants:    None.  Anti-Infectives:   IV bank  Subjective:    Colleen Ramirez was sitting in bed, in NAD. Reported having some redness over her forehead, between her eyebrows due to "previous infection" but no fluctuance, discharge, or erythema noted on exam. She reports not being able to sleep well for days, agrees to trying trazodone for sleep.   Objective:    Vitals:   10/14/19 1300 10/14/19 2016 10/15/19 0400 10/15/19 0843  BP: (!) 117/97 94/67 (!) 117/92 119/84  Pulse: (!) 102 100 89 92  Resp:  16 16 17   Temp:  98.7 F (37.1 C) (!) 97.3 F (36.3 C) 97.8 F (36.6 C)  TempSrc:  Oral Axillary Axillary  SpO2: 99% 99% 99% 99%  Weight:      Height:       SpO2: 99 %   Intake/Output Summary (Last 24 hours) at 10/15/2019 1222 Last data filed at 10/15/2019 0500 Gross per 24 hour  Intake 526 ml  Output --  Net 526 ml   Filed Weights   09/29/19 2032  Weight: 49.9 kg    Exam: General exam: In no acute distress. AAOx4.  Respiratory system: Good air movement and no  respiratory distress.  Cardiovascular system: S1 & S2 present, RRR.  Gastrointestinal system: Abdomen is nondistended, soft and nontender.  Central nervous system: Alert and oriented. No focal neurological deficits. Extremities: No pedal edema. Skin: No rashes, lesions or ulcers  Data Reviewed:    Labs: Basic Metabolic Panel: Recent Labs  Lab 10/09/19 0530 10/09/19 0530 10/12/19 0621 10/15/19 0556  NA 136  --  135 139  K 4.4   < > 4.2 4.3  CL 97*  --  95* 101  CO2 29  --  29 29  GLUCOSE 95  --  120* 96  BUN 7  --  10 10  CREATININE 0.57  --  0.59 0.61  CALCIUM 9.0  --  8.9 9.2   < > = values in this interval not displayed.   GFR Estimated Creatinine Clearance: 74.4 mL/min (by C-G formula based on SCr of 0.61 mg/dL). Liver Function Tests: Recent Labs  Lab 10/11/19 0616  AST 18  ALT 19  ALKPHOS 82  BILITOT 0.4  PROT 6.9  ALBUMIN 2.5*   No results for input(s): LIPASE, AMYLASE in the last 168 hours. No results for input(s): AMMONIA in the last 168 hours. Coagulation profile No results for input(s): INR, PROTIME in the last 168 hours. COVID-19 Labs  No results for input(s): DDIMER, FERRITIN, LDH, CRP in the last 72 hours.  Lab Results  Component Value Date   SARSCOV2NAA NEGATIVE 09/29/2019   [place] NEGATIVE 08/18/2019   [place] NEGATIVE 07/24/2019    CBC: Recent Labs  Lab 10/09/19 0530 10/14/19 0605  WBC 11.7* 8.9  NEUTROABS 8.1* 5.6  HGB 11.8* 11.5*  HCT 36.5 35.9*  MCV 86.9 88.6  PLT 729* 638*   Cardiac Enzymes: No results for input(s): CKTOTAL, CKMB, CKMBINDEX, TROPONINI in the last 168 hours. BNP (last 3 results) No results for input(s): PROBNP in the last 8760 hours. CBG: No results for input(s): GLUCAP in the last 168 hours. D-Dimer: No results for input(s): DDIMER in the last 72 hours. Hgb A1c: No results for input(s): HGBA1C in the last 72 hours. Lipid Profile: No results for input(s): CHOL, HDL, LDLCALC, TRIG, CHOLHDL,  LDLDIRECT in the last 72 hours. Thyroid function studies: No results for input(s): TSH, T4TOTAL, T3FREE, THYROIDAB in the last 72 hours.  Invalid input(s): FREET3 Anemia work up: No results for input(s): VITAMINB12, FOLATE, FERRITIN, TIBC, IRON, RETICCTPCT in the last 72 hours. Sepsis Labs: Recent Labs  Lab 10/09/19 0530 10/14/19 0605  WBC 11.7* 8.9   Microbiology No results found for this or any previous visit (from the past 240 hour(s)).   Medications:   . Chlorhexidine Gluconate Cloth  6 each Topical Q0600  . enoxaparin (LOVENOX) injection  40 mg Subcutaneous Q24H  . feeding supplement (ENSURE ENLIVE)  237 mL Oral BID BM  . lidocaine  1 patch Transdermal Daily  .  multivitamin with minerals  1 tablet Oral Daily  . nicotine  21 mg Transdermal Daily  . rifampin  300 mg Oral Q12H  . senna-docusate  1 tablet Oral BID  . sodium chloride flush  3 mL Intravenous Q12H   Continuous Infusions: . sodium chloride 250 mL (10/09/19 2024)  . vancomycin Stopped (10/15/19 1124)      LOS: 16 days   Ky Barban, MD  [facility]  10/15/2019, 12:22 PM

## 2019-10-16 MED ORDER — TRAZODONE HCL 50 MG PO TABS
100.0000 mg | ORAL_TABLET | Freq: Every evening | ORAL | Status: DC | PRN
  Administered 2019-10-16 – 2019-10-21 (×3): 100 mg via ORAL
  Administered 2019-10-23 – 2019-10-24 (×2): 50 mg via ORAL
  Administered 2019-10-26 – 2019-12-10 (×7): 100 mg via ORAL
  Filled 2019-10-16 (×15): qty 2

## 2019-10-16 NOTE — Progress Notes (Signed)
Pharmacy Antibiotic Note  Colleen Ramirez is a 39 y.o. female admitted on 09/29/2019 with MRSA cervical spine infection. Pharmacy has been consulted for Vancomycin dosing. Also on rifampin. Patient remains afebrile with stable renal function.  Vancomycin levels collected 1/26 as follows: Vancomycin peak 24, trough of 9 calculated AUC 420.2. Pt specific kinetics: Vd: 42.1L, Ke: 0.1132 T 1/2 6.1 hours. Dose was increased for an estimated AUC of 524. Will plan for levels again next week.  Plan: - Continue Vancomycin 1250 mg IV Q12 hrs - Rifampin 300mg  PO q12h - Watch renal function. - ID planning 6 weeks of treatment  Height: 5\' 3"  (160 cm) Weight: 110 lb (49.9 kg) IBW/kg (Calculated) : 52.4  Temp (24hrs), Avg:97.9 F (36.6 C), Min:97.6 F (36.4 C), Max:98 F (36.7 C)  Recent Labs  Lab 10/09/19 1005 10/12/19 0621 10/12/19 2242 10/13/19 0722 10/14/19 0605 10/15/19 0556  WBC  --   --   --   --  8.9  --   CREATININE  --  0.59  --   --   --  0.61  VANCOTROUGH  --   --   --  9*  --   --   VANCOPEAK 29*  --  24*  --   --   --     Estimated Creatinine Clearance: 74.4 mL/min (by C-G formula based on SCr of 0.61 mg/dL).    No Known Allergies  Antimicrobials this admission: Zosyn 1/12 >> 1/13 Vanc 1/12 >> Rifampin 1/14 >>  Dose adjustments this admission: 1/16 & 1/17: 21 and <4 - 1g x 1 and adj to 750 mg/8h 1/19: VP 32, VT 11, AUC 521 - no change 1/22 VP/VT 29/14 AUC 542 - adjusted to 1000mg  q12h 1/26: VP/VT 24/9 AUC 420 - adjusted to 1250 mg q12h  Microbiology results: 1/12 abscess cx - MRSA  , PharmD PGY1 Pharmacy Resident Phone: [phone] 10/16/2019  8:11 AM  Please check [email or web address] for unit-specific pharmacy phone numbers.

## 2019-10-16 NOTE — Plan of Care (Signed)
°  Problem: Education: °Goal: Knowledge of General Education information will improve °Description: Including pain rating scale, medication(s)/side effects and non-pharmacologic comfort measures °Outcome: Progressing °  °Problem: Health Behavior/Discharge Planning: °Goal: Ability to manage health-related needs will improve °Outcome: Progressing °  °Problem: Clinical Measurements: °Goal: Ability to maintain clinical measurements within normal limits will improve °Outcome: Progressing °Goal: Will remain free from infection °Outcome: Progressing °Goal: Diagnostic test results will improve °Outcome: Progressing °  °Problem: Activity: °Goal: Risk for activity intolerance will decrease °Outcome: Progressing °  °Problem: Nutrition: °Goal: Adequate nutrition will be maintained °Outcome: Progressing °  °Problem: Coping: °Goal: Level of anxiety will decrease °Outcome: Progressing °  °Problem: Pain Managment: °Goal: General experience of comfort will improve °Outcome: Progressing °  °Problem: Safety: °Goal: Ability to remain free from injury will improve °Outcome: Progressing °  °Problem: Skin Integrity: °Goal: Risk for impaired skin integrity will decrease °Outcome: Progressing °  °

## 2019-10-16 NOTE — Social Work (Signed)
CSW acknowledging consult for substance use. Will evaluate pt needs and interest in resources.  Pt will need to remain inpatient to complete iv antibiotics due to multiple barriers including but not limited to homelessness, substance use, and no current insurance. TOC team will follow with pt - Pt was provided with MATCH with $0 copay, will need dates updated at dc. Has appt at [facility], will need rescheduled at dc  Alexx Mcburney, MSW, LCSWA [place] Clinical Social Work   

## 2019-10-16 NOTE — Progress Notes (Signed)
Physical Therapy Treatment Patient Details Name: Colleen Ramirez MRN: [id number] DOB: 09/22/1980 Today's Date: 10/16/2019    History of Present Illness Colleen Ramirez is a 39 y.o. F admitted to the ED with c/o severe neck pain secondary to a cervical epidural abscess. She is s/p C5-6 anterior cervical discectomy and instrumented fusion. PMH: IVDU, anemia, anxiety, depression, recent admission for facial cellulitis.     PT Comments    Pt initially declining treatment, stating she was very nauseous today and movement seemed to exacerbate it. Pt then began stating she felt her walking was fine but she felt very weak in the shoulders and UEs. Provided pt with basic UE ther ex.  However if she is able to tolerate resisted UE movements, she may benefit from UE HEP with use of therabands. Will continue to follow acutely.     Follow Up Recommendations  No PT follow up     Equipment Recommendations  None recommended by PT    Recommendations for Other Services       Precautions / Restrictions Precautions Precautions: Fall;Cervical Precaution Booklet Issued: Yes (comment) Precaution Comments: reviewed cervical precautions with pt Required Braces or Orthoses: Cervical Brace Cervical Brace: Soft collar;At all times Restrictions Weight Bearing Restrictions: No    Mobility  Bed Mobility Overal bed mobility: Modified Independent             General bed mobility comments: in chair on arrival  Transfers                    Ambulation/Gait                 Stairs             Wheelchair Mobility    Modified Rankin (Stroke Patients Only)       Balance                                            Cognition Arousal/Alertness: Awake/alert Behavior During Therapy: WFL for tasks assessed/performed Overall Cognitive Status: Within Functional Limits for tasks assessed                                        Exercises General  Exercises - Upper Extremity Shoulder Flexion: AROM;Both;10 reps;Seated Shoulder ABduction: AROM;Both;10 reps;Seated Elbow Flexion: AROM;Both;10 reps;Seated Other Exercises Other Exercises: Scapular retraction; both; 5 reps; seated. Pt needing to take rest after performing 3 secondary to muscle fatigue. She struggles to hold trunk upright while performing scapular retraction.    General Comments        Pertinent Vitals/Pain Pain Assessment: Faces Faces Pain Scale: Hurts little more Pain Location: neck Pain Descriptors / Indicators: Discomfort;Sore Pain Intervention(s): Monitored during session;Limited activity within patient's tolerance    Home Living                      Prior Function            PT Goals (current goals can now be found in the care plan section) Acute Rehab PT Goals Patient Stated Goal: decrease pain PT Goal Formulation: With patient Time For Goal Achievement: 10/28/19 Potential to Achieve Goals: Good Progress towards PT goals: Progressing toward goals    Frequency    Min 3X/week  PT Plan Current plan remains appropriate    Co-evaluation              AM-PAC PT "6 Clicks" Mobility   Outcome Measure  Help needed turning from your back to your side while in a flat bed without using bedrails?: None Help needed moving from lying on your back to sitting on the side of a flat bed without using bedrails?: None Help needed moving to and from a bed to a chair (including a wheelchair)?: None Help needed standing up from a chair using your arms (e.g., wheelchair or bedside chair)?: None Help needed to walk in hospital room?: None Help needed climbing 3-5 steps with a railing? : A Little 6 Click Score: 23    End of Session Equipment Utilized During Treatment: Cervical collar Activity Tolerance: Other (comment)(nausea) Patient left: with call bell/phone within reach;in chair Nurse Communication: Mobility status PT Visit Diagnosis:  Unsteadiness on feet (R26.81);Other abnormalities of gait and mobility (R26.89);Muscle weakness (generalized) (M62.81);Pain Pain - Right/Left: Right(central) Pain - part of body: (neck)     Time: 2641-5830 PT Time Calculation (min) (ACUTE ONLY): 15 min  Charges:  $Therapeutic Exercise: 8-22 mins                     Benjiman Core, [place] Pager [phone] Acute Rehab   Allena Katz 10/16/2019, 1:50 PM

## 2019-10-16 NOTE — Plan of Care (Signed)

## 2019-10-16 NOTE — Progress Notes (Signed)
[facility] PROGRESS NOTE    Progress Note  Colleen Ramirez  [id number] DOB: [birth year] DOA: 09/29/2019 PCP: Patient, No Pcp Per     Brief Narrative:   Colleen Ramirez is an 39 y.o. female past medical history of IV drugs (methamphetamine) anemia and satiety status post gastric bypass and homeless who presents with a 2-day history of severe sharp neck pain constant.  She was just recently hospitalized and discharged on 08/25/2019 for facial cellulitis was on doxycycline twice daily for which she completed a 10-day course and clindamycin topical.  She underwent ACDF by neurosurgery on 09/29/2019. Intraperitoneal culture grew MRSA she is currently on IV vancomycin and rifampin and will likely remain inpatient for the duration of her IV therapy due to her IVDU history.  Assessment/Plan:   Sepsis due to cervical MRSA epidural abscess MRI of C-spine showed discitis and osteomyelitis of C5-C6, C6 and C7 and pronounced paravertebral/retropharyngeal soft tissue phlegmon with abscess formation. She underwent ACDF by neurosurgery in 09/29/2019, intraoperative culture grew MRSA. Infectious disease was consulted recommended IV Vanco and rifampin and plan for 6 weeks of IV antibiotics from day of surgery of 09/29/2019. She is not a candidate for outpatient IV antibiotics with a PICC line due to her concurrent IV drug abuse. As per previous physician she tends to be oversedated.   Patient again requested escalation of her opioids but will continue current regimen for now.  She is not a candidate for IV antibiotics for 6 weeks with a PICC line at home.   Sinus tachycardia: Likely reactive. Improving.  Narcotic seeking behavior polysubstance abuse/IVDU: Current regimen: Dilaudid 0.5 mg q3h PRN, Toradol 30 mg q6h PRN, Lidoderm patch, Oxycodone 20 mg q3h PRN  HSV positivity: Per infectious disease will need to follow-up with them as an outpatient.  History of depression and anxiety: Patient  denies SI and HI Reports that she has her own psychiatrist and does not want to see one while inpa tient.  Reports being on psych meds at home.  Home med rec not done from admission, requested pharmacy consult.   Tobaccos Use: Nicotine patch 21 mg  Insomnia:  Will continue trazodone hs as needed, increase the dose.   DVT prophylaxis: lovenox Family Communication: none Disposition Plan/Barrier to D/C: Once she is off her IV antibiotics. PT/OT report no needs. Code Status:     Code Status Orders  (From admission, onward)         Start     Ordered   09/29/19 2336  Full code  Continuous     09/29/19 2335        Code Status History    Date Active Date Inactive Code Status Order ID Comments User Context   08/18/2019 1444 08/25/2019 1517 Full Code [id number]  Karmen Bongo, MD Inpatient   07/24/2019 1114 07/31/2019 1404 Full Code [id number]  Elmarie Shiley, MD Inpatient   Advance Care Planning Activity        IV Access:    Peripheral IV   Procedures and diagnostic studies:   No results found.   Medical Consultants:    None.  Anti-Infectives:   IV bank  Subjective:    Colleen Ramirez was sitting in chair, using materials for hand strengthening exercises as recommended per OT. Wearing her soft neck collar today. Reports that she slept better last night.   Objective:    Vitals:   10/15/19 1900 10/16/19 0334 10/16/19 0745 10/16/19 1406  BP: 115/86 110/82 110/82 112/89  Pulse: 98 89 (!)  105 (!) 103  Resp: 18 15 17 17   Temp: 98 F (36.7 C) 97.9 F (36.6 C) 98 F (36.7 C) 99.4 F (37.4 C)  TempSrc: Oral  Oral Oral  SpO2:  100% 99% 99%  Weight:      Height:       SpO2: 99 %   Intake/Output Summary (Last 24 hours) at 10/16/2019 1818 Last data filed at 10/16/2019 1100 Gross per 24 hour  Intake 720 ml  Output --  Net 720 ml   Filed Weights   09/29/19 2032  Weight: 49.9 kg    Exam: General exam: In no acute distress. AAOx4.  Respiratory  system: Good air movement and no respiratory distress.  Cardiovascular system: S1 & S2 present, RRR.  Gastrointestinal system: Abdomen is nondistended, soft and nontender.  Central nervous system: Alert and oriented. No focal neurological deficits. Extremities: No pedal edema. Skin: No rashes, lesions or ulcers  Data Reviewed:    Labs: Basic Metabolic Panel: Recent Labs  Lab 10/12/19 0621 10/15/19 0556  NA 135 139  K 4.2 4.3  CL 95* 101  CO2 29 29  GLUCOSE 120* 96  BUN 10 10  CREATININE 0.59 0.61  CALCIUM 8.9 9.2   GFR Estimated Creatinine Clearance: 74.4 mL/min (by C-G formula based on SCr of 0.61 mg/dL). Liver Function Tests: Recent Labs  Lab 10/11/19 0616  AST 18  ALT 19  ALKPHOS 82  BILITOT 0.4  PROT 6.9  ALBUMIN 2.5*   No results for input(s): LIPASE, AMYLASE in the last 168 hours. No results for input(s): AMMONIA in the last 168 hours. Coagulation profile No results for input(s): INR, PROTIME in the last 168 hours. COVID-19 Labs  No results for input(s): DDIMER, FERRITIN, LDH, CRP in the last 72 hours.  Lab Results  Component Value Date   SARSCOV2NAA NEGATIVE 09/29/2019   SARSCOV2NAA NEGATIVE 08/18/2019   SARSCOV2NAA NEGATIVE 07/24/2019    CBC: Recent Labs  Lab 10/14/19 0605  WBC 8.9  NEUTROABS 5.6  HGB 11.5*  HCT 35.9*  MCV 88.6  PLT 638*   Cardiac Enzymes: No results for input(s): CKTOTAL, CKMB, CKMBINDEX, TROPONINI in the last 168 hours. BNP (last 3 results) No results for input(s): PROBNP in the last 8760 hours. CBG: No results for input(s): GLUCAP in the last 168 hours. D-Dimer: No results for input(s): DDIMER in the last 72 hours. Hgb A1c: No results for input(s): HGBA1C in the last 72 hours. Lipid Profile: No results for input(s): CHOL, HDL, LDLCALC, TRIG, CHOLHDL, LDLDIRECT in the last 72 hours. Thyroid function studies: No results for input(s): TSH, T4TOTAL, T3FREE, THYROIDAB in the last 72 hours.  Invalid input(s):  FREET3 Anemia work up: No results for input(s): VITAMINB12, FOLATE, FERRITIN, TIBC, IRON, RETICCTPCT in the last 72 hours. Sepsis Labs: Recent Labs  Lab 10/14/19 0605  WBC 8.9   Microbiology No results found for this or any previous visit (from the past 240 hour(s)).   Medications:   . Chlorhexidine Gluconate Cloth  6 each Topical Q0600  . enoxaparin (LOVENOX) injection  40 mg Subcutaneous Q24H  . feeding supplement (ENSURE ENLIVE)  237 mL Oral BID BM  . lidocaine  1 patch Transdermal Daily  . multivitamin with minerals  1 tablet Oral Daily  . nicotine  21 mg Transdermal Daily  . rifampin  300 mg Oral Q12H  . senna-docusate  1 tablet Oral BID  . sodium chloride flush  3 mL Intravenous Q12H   Continuous Infusions: . sodium chloride 250  mL (10/15/19 2134)  . vancomycin Stopped (10/16/19 1609)      LOS: 17 days   Ky Barban, MD  [facility]  10/16/2019, 6:18 PM

## 2019-10-17 NOTE — Plan of Care (Signed)

## 2019-10-17 NOTE — Plan of Care (Signed)
  Problem: Pain Managment: Goal: General experience of comfort will improve Outcome: Progressing   

## 2019-10-17 NOTE — Progress Notes (Signed)
[facility] PROGRESS NOTE    Progress Note  Colleen Ramirez  NKN:[id number] DOB: July 19, 1981 DOA: 09/29/2019 PCP: Patient, No Pcp Per     Brief Narrative:   Colleen Ramirez is an 39 y.o. female past medical history of IV drugs (methamphetamine) anemia and satiety status post gastric bypass and homeless who presents with a 2-day history of severe sharp neck pain constant.  She was just recently hospitalized and discharged on 08/25/2019 for facial cellulitis was on doxycycline twice daily for which she completed a 10-day course and clindamycin topical.  She underwent ACDF by neurosurgery on 09/29/2019. Intraperitoneal culture grew MRSA she is currently on IV vancomycin and rifampin and will likely remain inpatient for the duration of her IV therapy due to her IVDU history.  Assessment/Plan:   Sepsis due to cervical MRSA epidural abscess MRI of C-spine showed discitis and osteomyelitis of C5-C6, C6 and C7 and pronounced paravertebral/retropharyngeal soft tissue phlegmon with abscess formation. She underwent ACDF by neurosurgery in 09/29/2019, intraoperative culture grew MRSA. Infectious disease was consulted recommended IV Vanco and rifampin and plan for 6 weeks of IV antibiotics from day of surgery of 09/29/2019. She is not a candidate for outpatient IV antibiotics with a PICC line due to her concurrent IV drug abuse. As per previous physician she tends to be oversedated.   Patient again requested escalation of her opioids but will continue current regimen for now.  She is not a candidate for IV antibiotics for 6 weeks with a PICC line at home.   Sinus tachycardia: Likely reactive. Improving. Patient refusing telemetry now, this was discontinued.   Narcotic seeking behavior polysubstance abuse/IVDU: Current regimen: Dilaudid 0.5 mg q3h PRN, Lidoderm patch, Oxycodone 20 mg q3h PRN. Toradol discontinued as per recs at the patient had been on it for 5 days but may be restarted if needed.    HSV positivity: Per infectious disease will need to follow-up with them as an outpatient.  History of depression and anxiety: Patient denies SI and HI Reports that she has her own psychiatrist and does not want to see one while inpa tient.  Reports being on psych meds at home.  Home med rec done now but patient declines having her Wellbutrin and Abilify started without starting Valium and Adderall. Given her tachycardia, adderall would not be recommended.  Valium could not be started without an outpatient follow up for continuation for this medication--she reports that all her medications are lost and she is not sure if she will have refill for them.  She again declined seeing inpatient psychiatry to restart her medications and have outpatient follow up here stating that she only wanted the medications prescribed by her previous Psychiatrist.   Tobaccos Use: Nicotine patch 21 mg  Insomnia:  Will continue trazodone hs as needed.   DVT prophylaxis: lovenox Family Communication: none Disposition Plan/Barrier to D/C: Once she is off her IV antibiotics. PT/OT report no needs. Code Status:     Code Status Orders  (From admission, onward)         Start     Ordered   09/29/19 2336  Full code  Continuous     09/29/19 2335        Code Status History    Date Active Date Inactive Code Status Order ID Comments User Context   08/18/2019 1444 08/25/2019 1517 Full Code [id number]  Jonah Blue, MD Inpatient   07/24/2019 1114 07/31/2019 1404 Full Code [id number]  Alba Cory, MD Inpatient   Advance Care  Planning Activity        IV Access:    Peripheral IV   Procedures and diagnostic studies:   No results found.   Medical Consultants:    None.  Anti-Infectives:   IV bank  Subjective:    Colleen Ramirez was resting in bed. She was irritable today and using explicit language at times.   Objective:    Vitals:   10/16/19 1406 10/16/19 2057 10/17/19 0344  10/17/19 1258  BP: 112/89 (!) 110/93 99/76 107/80  Pulse: (!) 103 98 90 91  Resp: 17 15 16 19   Temp: 99.4 F (37.4 C) 97.8 F (36.6 C) 98.5 F (36.9 C) 97.7 F (36.5 C)  TempSrc: Oral   Oral  SpO2: 99% 100% 100% 100%  Weight:      Height:       SpO2: 100 %   Intake/Output Summary (Last 24 hours) at 10/17/2019 1926 Last data filed at 10/17/2019 1500 Gross per 24 hour  Intake 1200 ml  Output --  Net 1200 ml   Filed Weights   09/29/19 2032  Weight: 49.9 kg    Exam: General exam: In no acute distress. AAOx4.  Respiratory system: Good air movement and no respiratory distress.  Cardiovascular system: S1 & S2 present, RRR.  Gastrointestinal system: Abdomen is nondistended, soft and nontender.  Central nervous system: Alert and oriented. No focal neurological deficits. Extremities: No pedal edema. Skin: No rashes, lesions or ulcers  Data Reviewed:    Labs: Basic Metabolic Panel: Recent Labs  Lab 10/12/19 0621 10/15/19 0556  NA 135 139  K 4.2 4.3  CL 95* 101  CO2 29 29  GLUCOSE 120* 96  BUN 10 10  CREATININE 0.59 0.61  CALCIUM 8.9 9.2   GFR Estimated Creatinine Clearance: 74.4 mL/min (by C-G formula based on SCr of 0.61 mg/dL). Liver Function Tests: Recent Labs  Lab 10/11/19 0616  AST 18  ALT 19  ALKPHOS 82  BILITOT 0.4  PROT 6.9  ALBUMIN 2.5*   No results for input(s): LIPASE, AMYLASE in the last 168 hours. No results for input(s): AMMONIA in the last 168 hours. Coagulation profile No results for input(s): INR, PROTIME in the last 168 hours. COVID-19 Labs  No results for input(s): DDIMER, FERRITIN, LDH, CRP in the last 72 hours.  Lab Results  Component Value Date   SARSCOV2NAA NEGATIVE 09/29/2019   SARSCOV2NAA NEGATIVE 08/18/2019   SARSCOV2NAA NEGATIVE 07/24/2019    CBC: Recent Labs  Lab 10/14/19 0605  WBC 8.9  NEUTROABS 5.6  HGB 11.5*  HCT 35.9*  MCV 88.6  PLT 638*   Cardiac Enzymes: No results for input(s): CKTOTAL, CKMB,  CKMBINDEX, TROPONINI in the last 168 hours. BNP (last 3 results) No results for input(s): PROBNP in the last 8760 hours. CBG: No results for input(s): GLUCAP in the last 168 hours. D-Dimer: No results for input(s): DDIMER in the last 72 hours. Hgb A1c: No results for input(s): HGBA1C in the last 72 hours. Lipid Profile: No results for input(s): CHOL, HDL, LDLCALC, TRIG, CHOLHDL, LDLDIRECT in the last 72 hours. Thyroid function studies: No results for input(s): TSH, T4TOTAL, T3FREE, THYROIDAB in the last 72 hours.  Invalid input(s): FREET3 Anemia work up: No results for input(s): VITAMINB12, FOLATE, FERRITIN, TIBC, IRON, RETICCTPCT in the last 72 hours. Sepsis Labs: Recent Labs  Lab 10/14/19 0605  WBC 8.9   Microbiology No results found for this or any previous visit (from the past 240 hour(s)).   Medications:   .  Chlorhexidine Gluconate Cloth  6 each Topical Q0600  . enoxaparin (LOVENOX) injection  40 mg Subcutaneous Q24H  . feeding supplement (ENSURE ENLIVE)  237 mL Oral BID BM  . lidocaine  1 patch Transdermal Daily  . multivitamin with minerals  1 tablet Oral Daily  . nicotine  21 mg Transdermal Daily  . rifampin  300 mg Oral Q12H  . senna-docusate  1 tablet Oral BID  . sodium chloride flush  3 mL Intravenous Q12H   Continuous Infusions: . sodium chloride 250 mL (10/15/19 2134)  . vancomycin 1,250 mg (10/17/19 1018)      LOS: 18 days   Blain Pais, MD  [facility]  10/17/2019, 7:26 PM

## 2019-10-18 LAB — BASIC METABOLIC PANEL
Anion gap: 12 (ref 5–15)
BUN: 12 mg/dL (ref 6–20)
CO2: 23 mmol/L (ref 22–32)
Calcium: 9 mg/dL (ref 8.9–10.3)
Chloride: 102 mmol/L (ref 98–111)
Creatinine, Ser: 0.65 mg/dL (ref 0.44–1.00)
GFR calc Af Amer: >60 mL/min (ref >60)
GFR calc non Af Amer: >60 mL/min (ref >60)
Glucose, Bld: 159 mg/dL — ABNORMAL HIGH (ref 70–99)
Potassium: 4 mmol/L (ref 3.5–5.1)
Sodium: 137 mmol/L (ref 135–145)

## 2019-10-18 MED ORDER — GABAPENTIN 100 MG PO CAPS
100.0000 mg | ORAL_CAPSULE | Freq: Three times a day (TID) | ORAL | Status: DC
  Administered 2019-10-18 – 2019-10-21 (×12): 100 mg via ORAL
  Filled 2019-10-18 (×12): qty 1

## 2019-10-18 NOTE — Plan of Care (Signed)
  Problem: Pain Managment: Goal: General experience of comfort will improve Outcome: Progressing   Problem: Safety: Goal: Ability to remain free from injury will improve Outcome: Progressing   Problem: Skin Integrity: Goal: Risk for impaired skin integrity will decrease Outcome: Progressing   

## 2019-10-18 NOTE — Plan of Care (Signed)
  Problem: Pain Managment: Goal: General experience of comfort will improve Outcome: Progressing   

## 2019-10-18 NOTE — Progress Notes (Signed)
[facility] PROGRESS NOTE    Progress Note  Colleen Ramirez  IRC:[id number] DOB: [birth year] DOA: 09/29/2019 PCP: Patient, No Pcp Per     Brief Narrative:   Colleen Ramirez is an 39 y.o. female past medical history of IV drugs (methamphetamine) anemia and satiety status post gastric bypass and homeless who presents with a 2-day history of severe sharp neck pain constant.  She was just recently hospitalized and discharged on 08/25/2019 for facial cellulitis was on doxycycline twice daily for which she completed a 10-day course and clindamycin topical.  She underwent ACDF by neurosurgery on 09/29/2019. Intraperitoneal culture grew MRSA she is currently on IV vancomycin and rifampin and will likely remain inpatient for the duration of her IV therapy due to her IVDU history.  Assessment/Plan:   Sepsis due to cervical MRSA epidural abscess MRI of C-spine showed discitis and osteomyelitis of C5-C6, C6 and C7 and pronounced paravertebral/retropharyngeal soft tissue phlegmon with abscess formation. She underwent ACDF by neurosurgery in 09/29/2019, intraoperative culture grew MRSA. Infectious disease was consulted recommended IV Vanco and rifampin and plan for 6 weeks of IV antibiotics from day of surgery of 09/29/2019. She is not a candidate for outpatient IV antibiotics with a PICC line due to her concurrent IV drug abuse. As per previous physician she tends to be oversedated.   Patient again requested escalation of her opioids but will continue current regimen for now.  She is not a candidate for IV antibiotics for 6 weeks with a PICC line at home.   Sinus tachycardia: Likely reactive. Improving. Patient refusing telemetry now, this was discontinued.   Narcotic seeking behavior polysubstance abuse/IVDU: Current regimen: Dilaudid 0.5 mg q3h PRN, Lidoderm patch, Oxycodone 20 mg q3h PRN. Toradol discontinued as per recs at the patient had been on it for 5 days but may be restarted if needed.   -Start gabapentin 100mg  po TID for what she describes as never pain in her neck.   HSV positivity: Per infectious disease will need to follow-up with them as an outpatient.  History of depression and anxiety: Patient denies SI and HI Reports that she has her own psychiatrist and does not want to see one while inpa tient.  Reports being on psych meds at home.  Home med rec done now but patient declines having her Wellbutrin and Abilify started without starting Valium and Adderall. Given her tachycardia, adderall would not be recommended.  Valium could not be started without an outpatient follow up for continuation for this medication--she reports that all her medications are lost and she is not sure if she will have refill for them.  She again declined seeing inpatient psychiatry to restart her medications and have outpatient follow up here stating that she only wanted the medications prescribed by her previous Psychiatrist.  -She requested that Pharmacy let her know if her Psychiatrist could send electronic prescriptions of her medications. She reports that her Psychiatrist practices out of [place]. I contacted inpatient Pharmacy, they are not sure if her controlled medications could be prescribed that way. They will contact the [facility] outpatient Pharmacy tomorrow and verify.   Tobaccos Use: Nicotine patch 21 mg  Insomnia:  Will continue trazodone hs as needed.   DVT prophylaxis: lovenox Family Communication: none Disposition Plan/Barrier to D/C: Once she is off her IV antibiotics. PT/OT report no needs. Code Status:     Code Status Orders  (From admission, onward)         Start     Ordered  09/29/19 2336  Full code  Continuous     09/29/19 2335        Code Status History    Date Active Date Inactive Code Status Order ID Comments User Context   08/18/2019 1444 08/25/2019 1517 Full Code [id number]  Karmen Bongo, MD Inpatient   07/24/2019 1114 07/31/2019 1404 Full Code [id number]   Elmarie Shiley, MD Inpatient   Advance Care Planning Activity        IV Access:    Peripheral IV   Procedures and diagnostic studies:   No results found.   Medical Consultants:    None.  Anti-Infectives:   IV bank  Subjective:    Colleen Ramirez was walking in her room. No apparent distress. Her mood has improved today.   Objective:    Vitals:   10/17/19 0344 10/17/19 1258 10/17/19 2039 10/18/19 0329  BP: 99/76 107/80 107/83 102/70  Pulse: 90 91 (!) 106 97  Resp: 16 19 16 14   Temp: 98.5 F (36.9 C) 97.7 F (36.5 C) 98.3 F (36.8 C)   TempSrc:  Oral Oral   SpO2: 100% 100% 99% 98%  Weight:      Height:       SpO2: 98 %   Intake/Output Summary (Last 24 hours) at 10/18/2019 1735 Last data filed at 10/18/2019 0900 Gross per 24 hour  Intake 360 ml  Output -  Net 360 ml   Filed Weights   09/29/19 2032  Weight: 49.9 kg    Exam: General exam: In no acute distress. AAOx4.  Respiratory system: Good air movement and no respiratory distress.  Cardiovascular system: S1 & S2 present, RRR.  Gastrointestinal system: Abdomen is nondistended, soft and nontender.  Central nervous system: Alert and oriented. No focal neurological deficits. Extremities: No pedal edema. Skin: No rashes, lesions or ulcers  Data Reviewed:    Labs: Basic Metabolic Panel: Recent Labs  Lab 10/12/19 0621 10/12/19 0621 10/15/19 0556 10/18/19 0537  NA 135  --  139 137  K 4.2   < > 4.3 4.0  CL 95*  --  101 102  CO2 29  --  29 23  GLUCOSE 120*  --  96 159*  BUN 10  --  10 12  CREATININE 0.59  --  0.61 0.65  CALCIUM 8.9  --  9.2 9.0   < > = values in this interval not displayed.   GFR Estimated Creatinine Clearance: 74.4 mL/min (by C-G formula based on SCr of 0.65 mg/dL). Liver Function Tests: No results for input(s): AST, ALT, ALKPHOS, BILITOT, PROT, ALBUMIN in the last 168 hours. No results for input(s): LIPASE, AMYLASE in the last 168 hours. No results for  input(s): AMMONIA in the last 168 hours. Coagulation profile No results for input(s): INR, PROTIME in the last 168 hours. COVID-19 Labs  No results for input(s): DDIMER, FERRITIN, LDH, CRP in the last 72 hours.  Lab Results  Component Value Date   SARSCOV2NAA NEGATIVE 09/29/2019   [place] NEGATIVE 08/18/2019   [place] NEGATIVE 07/24/2019    CBC: Recent Labs  Lab 10/14/19 0605  WBC 8.9  NEUTROABS 5.6  HGB 11.5*  HCT 35.9*  MCV 88.6  PLT 638*   Cardiac Enzymes: No results for input(s): CKTOTAL, CKMB, CKMBINDEX, TROPONINI in the last 168 hours. BNP (last 3 results) No results for input(s): PROBNP in the last 8760 hours. CBG: No results for input(s): GLUCAP in the last 168 hours. D-Dimer: No results for input(s): DDIMER in the last 72  hours. Hgb A1c: No results for input(s): HGBA1C in the last 72 hours. Lipid Profile: No results for input(s): CHOL, HDL, LDLCALC, TRIG, CHOLHDL, LDLDIRECT in the last 72 hours. Thyroid function studies: No results for input(s): TSH, T4TOTAL, T3FREE, THYROIDAB in the last 72 hours.  Invalid input(s): FREET3 Anemia work up: No results for input(s): VITAMINB12, FOLATE, FERRITIN, TIBC, IRON, RETICCTPCT in the last 72 hours. Sepsis Labs: Recent Labs  Lab 10/14/19 0605  WBC 8.9   Microbiology No results found for this or any previous visit (from the past 240 hour(s)).   Medications:   . Chlorhexidine Gluconate Cloth  6 each Topical Q0600  . enoxaparin (LOVENOX) injection  40 mg Subcutaneous Q24H  . feeding supplement (ENSURE ENLIVE)  237 mL Oral BID BM  . gabapentin  100 mg Oral TID  . lidocaine  1 patch Transdermal Daily  . multivitamin with minerals  1 tablet Oral Daily  . nicotine  21 mg Transdermal Daily  . rifampin  300 mg Oral Q12H  . senna-docusate  1 tablet Oral BID  . sodium chloride flush  3 mL Intravenous Q12H   Continuous Infusions: . sodium chloride 250 mL (10/15/19 2134)  . vancomycin 1,250 mg (10/18/19  0927)      LOS: 19 days   Ky Barban, MD  [facility]  10/18/2019, 5:35 PM

## 2019-10-19 DIAGNOSIS — L0211 Cutaneous abscess of neck: Secondary | ICD-10-CM

## 2019-10-19 DIAGNOSIS — G061 Intraspinal abscess and granuloma: Secondary | ICD-10-CM

## 2019-10-19 MED ORDER — BUPROPION HCL ER (XL) 150 MG PO TB24
150.0000 mg | ORAL_TABLET | Freq: Every day | ORAL | Status: DC
  Administered 2019-10-19 – 2019-12-11 (×54): 150 mg via ORAL
  Filled 2019-10-19 (×54): qty 1

## 2019-10-19 MED ORDER — NICOTINE 14 MG/24HR TD PT24
14.0000 mg | MEDICATED_PATCH | Freq: Every day | TRANSDERMAL | Status: DC
  Administered 2019-10-20 – 2019-12-03 (×41): 14 mg via TRANSDERMAL
  Filled 2019-10-19 (×49): qty 1

## 2019-10-19 MED ORDER — ARIPIPRAZOLE 5 MG PO TABS
20.0000 mg | ORAL_TABLET | Freq: Every day | ORAL | Status: DC
  Administered 2019-10-19 – 2019-12-11 (×54): 20 mg via ORAL
  Filled 2019-10-19 (×56): qty 4

## 2019-10-19 MED ORDER — AMPHETAMINE-DEXTROAMPHETAMINE 10 MG PO TABS
20.0000 mg | ORAL_TABLET | Freq: Every day | ORAL | Status: DC
  Administered 2019-10-19 – 2019-10-20 (×2): 20 mg via ORAL
  Filled 2019-10-19 (×2): qty 2

## 2019-10-19 MED ORDER — DIAZEPAM 5 MG PO TABS
5.0000 mg | ORAL_TABLET | Freq: Three times a day (TID) | ORAL | Status: DC | PRN
  Administered 2019-10-20 – 2019-12-09 (×40): 5 mg via ORAL
  Filled 2019-10-19 (×51): qty 1

## 2019-10-19 NOTE — Progress Notes (Signed)
Nutrition Follow-up  RD working remotely.  DOCUMENTATION CODES:   Not applicable  INTERVENTION:   -Continue Ensure Enlive po BID, each supplement provides 350 kcal and 20 grams of protein -Continue MVI with minerals daily -RD to sign off for medical stability (plan to remain hospitalized until completion of IV antibiotics); if further nutrition-related needs arise, please re-consult RD  NUTRITION DIAGNOSIS:   Increased nutrient needs related to post-op healing as evidenced by estimated needs.  Ongoing  GOAL:   Patient will meet greater than or equal to 90% of their needs  Progressing   MONITOR:   PO intake, Supplement acceptance, Labs, Weight trends, Skin, I & O's  REASON FOR ASSESSMENT:   Malnutrition Screening Tool    ASSESSMENT:   This is a 39 y.o. woman that presents with severe neck pain in the setting of IVDU w/ methamphetamine. She also has a h/o anemia, anxiety, and depression, recent admission for facial cellulitis. She has had 2 days of severe bilateral neck / interscapular and bilateral arm pain. She is currently in severe pain and is not a detailed historian. She does endorse that the pain is sharp in quality, severe in intensity, worse with movement but present at rest. She denies any new weakness, but has pain limited strength in the BUE. She does endorse some bilateral hand numbness but cannot tell which fingers. No recent change in bowel or bladder function. No recent use of anti-platelet or anti-coagulant medications.  1/12- s/pPROCEDURE:C5-C6Anterior Cervical Discectomy and Instrumented Fusion  Reviewed I/O's: +1.1 L x 24 hours and +630 ml since 10/05/19  Attempted to speak with pt via phone call, however, call disconnected.  Pt remains with good appetite; noted meal completion 100%. Pt is consuming Ensure supplements per MAR.   No new wt to assess at this time.   Pt will remain in the hospital for completion of IV antibiotics.  Labs reviewed.    Diet Order:   Diet Order            Diet regular Room service appropriate? Yes; Fluid consistency: Thin  Diet effective now              EDUCATION NEEDS:   No education needs have been identified at this time  Skin:  Skin Assessment: Skin Integrity Issues: Skin Integrity Issues:: Incisions Incisions: closed neck  Last BM:  10/18/19  Height:   Ht Readings from Last 1 Encounters:  09/29/19 5\' 3"  (1.6 m)    Weight:   Wt Readings from Last 1 Encounters:  09/29/19 49.9 kg    Ideal Body Weight:  52.2 kg  BMI:  Body mass index is 19.49 kg/m.  Estimated Nutritional Needs:   Kcal:  1750-1950  Protein:  85-100 grams  Fluid:  > 1.7 L    Marquitta Persichetti A. 11/27/19, RD, LDN, CDCES Registered Dietitian II Certified Diabetes Care and Education Specialist Pager: [phone] After hours Pager: [phone]

## 2019-10-19 NOTE — Progress Notes (Addendum)
Physical Therapy Treatment Patient Details Name: Colleen Ramirez MRN: [id number] DOB: 1981/02/13 Today's Date: 10/19/2019    History of Present Illness Colleen Ramirez is a 39 y.o. F admitted to the ED with c/o severe neck pain secondary to a cervical epidural abscess. She is s/p C5-6 anterior cervical discectomy and instrumented fusion. PMH: IVDU, anemia, anxiety, depression, recent admission for facial cellulitis.     PT Comments    Pt pleasant, supine on arrival reports having walked in the halls earlier. Pt states she cannot get OOB without assist of HOB elevated and has difficulty with ADLs due to lack of coordination of bil UE and weakness. Pt educated for sequence of bed level transfers and general UE exercises as well as cervical precautions and brace wear. Encouraged continued mobility and will follow with decreased frequency.     Follow Up Recommendations  No PT follow up     Equipment Recommendations  None recommended by PT    Recommendations for Other Services       Precautions / Restrictions Precautions Precautions: Fall;Cervical Precaution Comments: reviewed cervical precautions with pt Required Braces or Orthoses: Cervical Brace Cervical Brace: Soft collar;At all times Restrictions Weight Bearing Restrictions: No Other Position/Activity Restrictions: pt without collar on arrival with assist to don    Mobility  Bed Mobility   Bed Mobility: Rolling;Sidelying to Sit Rolling: Supervision Sidelying to sit: Min assist       General bed mobility comments: cues for sequence with min assist to elevate trunk from bed flat  Transfers     Transfers: Sit to/from Stand Sit to Stand: Modified independent (Device/Increase time)         General transfer comment: pt able to transition to stand and sit without assist, supervision for lines  Ambulation/Gait Ambulation/Gait assistance: Modified independent (Device/Increase time) Gait Distance (Feet): 600  Feet Assistive device: None Gait Pattern/deviations: Step-through pattern;WFL(Within Functional Limits)   Gait velocity interpretation: >4.37 ft/sec, indicative of normal walking speed General Gait Details: steady gait with good speed   Stairs             Wheelchair Mobility    Modified Rankin (Stroke Patients Only)       Balance Overall balance assessment: No apparent balance deficits (not formally assessed)                                          Cognition Arousal/Alertness: Awake/alert Behavior During Therapy: WFL for tasks assessed/performed Overall Cognitive Status: Within Functional Limits for tasks assessed                                        Exercises General Exercises - Upper Extremity Shoulder Flexion: AROM;Both;Seated;5 reps Shoulder ABduction: AROM;Both;5 reps;Seated Elbow Flexion: AROM;Both;10 reps;Seated Elbow Extension: AROM;Both;Seated;10 reps    General Comments        Pertinent Vitals/Pain Pain Score: 5  Pain Location: right shoulder and neck Pain Descriptors / Indicators: Discomfort;Sore;Stabbing Pain Intervention(s): Limited activity within patient's tolerance;Monitored during session;Premedicated before session;Repositioned    Home Living                      Prior Function            PT Goals (current goals can now be found in the care plan section)  Progress towards PT goals: Progressing toward goals    Frequency    Min 2X/week      PT Plan Current plan remains appropriate;Frequency needs to be updated    Co-evaluation              AM-PAC PT "6 Clicks" Mobility   Outcome Measure  Help needed turning from your back to your side while in a flat bed without using bedrails?: None Help needed moving from lying on your back to sitting on the side of a flat bed without using bedrails?: A Little Help needed moving to and from a bed to a chair (including a wheelchair)?:  None Help needed standing up from a chair using your arms (e.g., wheelchair or bedside chair)?: None Help needed to walk in hospital room?: None Help needed climbing 3-5 steps with a railing? : A Little 6 Click Score: 22    End of Session Equipment Utilized During Treatment: Cervical collar Activity Tolerance: Patient tolerated treatment well Patient left: in chair;with call bell/phone within reach Nurse Communication: Mobility status PT Visit Diagnosis: Other abnormalities of gait and mobility (R26.89);Muscle weakness (generalized) (M62.81);Pain     Time: 1657-9038 PT Time Calculation (min) (ACUTE ONLY): 26 min  Charges:  $Gait Training: 8-22 mins $Therapeutic Exercise: 8-22 mins                     Colleen Ramirez P, PT Acute Rehabilitation Services Pager: [phone] Office: [place] 10/19/2019, 12:44 PM

## 2019-10-19 NOTE — Progress Notes (Signed)
Occupational Therapy Treatment Patient Details Name: Colleen Ramirez MRN: [id number] DOB: [birth year] Today's Date: 10/19/2019    History of present illness Colleen Ramirez is a 39 y.o. F admitted to the ED with c/o severe neck pain secondary to a cervical epidural abscess. She is s/p C5-6 anterior cervical discectomy and instrumented fusion. PMH: IVDU, anemia, anxiety, depression, recent admission for facial cellulitis.    OT comments  Pt in recliner upon arrival without soft cervical collar on. Assisted pt with donning neck brace. OT reiterated to pt that soft collar is to be worn at all times, pt verbalizes understanding. Pt declined ADLs and toileting activity this afternoon, but agreeable to b UE there ex. Pt instructed on gentle shoulder exercises with level 1 theraband. Pt able to return demo with sup. Pt also performed B hand FMC/strength exercises with level 1 theraputty. OT will continue to follow acutely  Follow Up Recommendations  No OT follow up    Equipment Recommendations  Other (comment)(reacher)    Recommendations for Other Services      Precautions / Restrictions Precautions Precautions: Fall;Cervical Precaution Comments: reviewed cervical precautions with pt Required Braces or Orthoses: Cervical Brace Cervical Brace: Soft collar;At all times Restrictions Weight Bearing Restrictions: No Other Position/Activity Restrictions: pt without collar on arrival       Mobility Bed Mobility   Bed Mobility: Rolling;Sidelying to Sit Rolling: Supervision Sidelying to sit: Min assist       General bed mobility comments: pt in recliner upon arrival  Transfers Overall transfer level: Needs assistance Equipment used: None Transfers: Sit to/from Stand Sit to Stand: Modified independent (Device/Increase time)         General transfer comment: pt able to transition to stand and sit without assist, supervision for lines    Balance Overall balance assessment: No apparent  balance deficits (not formally assessed)                                         ADL either performed or assessed with clinical judgement   ADL Overall ADL's : Needs assistance/impaired                                       General ADL Comments: pt declined ADLs this session     Vision Baseline Vision/History: No visual deficits Patient Visual Report: No change from baseline     Perception     Praxis      Cognition Arousal/Alertness: Awake/alert Behavior During Therapy: WFL for tasks assessed/performed Overall Cognitive Status: Within Functional Limits for tasks assessed                                          Exercises General Exercises - Upper Extremity Shoulder Flexion: AROM;Both;Seated;5 reps Shoulder ABduction: AROM;Both;5 reps;Seated Elbow Flexion: AROM;Both;10 reps;Seated Elbow Extension: AROM;Both;Seated;10 reps Other Exercises Other Exercises: pt instructed on gentle shoulder exercises with level 1 theraband. Pt able to return demo with sup. Pt also performed B hand FMC/strength exercises with level 1 theraputty   Shoulder Instructions       General Comments      Pertinent Vitals/ Pain       Pain Assessment: 0-10 Pain Score: 5  Pain Location:  right shoulder and neck Pain Descriptors / Indicators: Discomfort;Sore;Stabbing Pain Intervention(s): Monitored during session;Repositioned  Home Living                                          Prior Functioning/Environment              Frequency  Min 2X/week        Progress Toward Goals  OT Goals(current goals can now be found in the care plan section)  Progress towards OT goals: OT to reassess next treatment  ADL Goals Additional ADL Goal #1: Pt will tolerate gentle shoulder exercises with level 1 theraband and B hand exercises with theraputty to improve FMC/strengthening for ADLs and ADL mobility  Plan Discharge plan needs to  be updated    Co-evaluation                 AM-PAC OT "6 Clicks" Daily Activity     Outcome Measure   Help from another person eating meals?: None Help from another person taking care of personal grooming?: None Help from another person toileting, which includes using toliet, bedpan, or urinal?: None Help from another person bathing (including washing, rinsing, drying)?: A Little Help from another person to put on and taking off regular upper body clothing?: None Help from another person to put on and taking off regular lower body clothing?: A Little 6 Click Score: 22    End of Session    OT Visit Diagnosis: Unsteadiness on feet (R26.81);Muscle weakness (generalized) (M62.81);Pain Pain - Right/Left: Right Pain - part of body: Shoulder(neck)   Activity Tolerance Patient tolerated treatment well   Patient Left with call bell/phone within reach;in chair   Nurse Communication          Time: 7628-3151 OT Time Calculation (min): 20 min  Charges: OT General Charges $OT Visit: 1 Visit OT Treatments $Therapeutic Exercise: 8-22 mins     Galen Manila 10/19/2019, 2:28 PM

## 2019-10-19 NOTE — Progress Notes (Signed)
Pharmacy Antibiotic Note  Colleen Ramirez is a 39 y.o. female admitted on 09/29/2019 with MRSA cervical spine infection. Pharmacy has been consulted for Vancomycin dosing. Also on rifampin. Patient remains afebrile with stable renal function.  Vancomycin levels collected 1/26 as follows: Vancomycin peak 24, trough of 9 calculated AUC 420.2. Pt specific kinetics: Vd: 42.1L, Ke: 0.1132 T 1/2 6.1 hours. Dose was increased for an estimated AUC of 524. Will plan for levels again this week.  Plan: - Continue Vancomycin 1250 mg IV Q12 hrs - Rifampin 300mg  PO q12h - Watch renal function. - ID planning 6 weeks of treatment  Height: 5\' 3"  (160 cm) Weight: 110 lb (49.9 kg) IBW/kg (Calculated) : 52.4  No data recorded.  Recent Labs  Lab 10/12/19 2242 10/13/19 0722 10/14/19 0605 10/15/19 0556 10/18/19 0537  WBC  --   --  8.9  --   --   CREATININE  --   --   --  0.61 0.65  VANCOTROUGH  --  9*  --   --   --   VANCOPEAK 24*  --   --   --   --     Estimated Creatinine Clearance: 74.4 mL/min (by C-G formula based on SCr of 0.65 mg/dL).    No Known Allergies  Antimicrobials this admission: Zosyn 1/12 >> 1/13 Vanc 1/12 >> Rifampin 1/14 >>  Dose adjustments this admission: 1/16 & 1/17: 21 and <4 - 1g x 1 and adj to 750 mg/8h 1/19: VP 32, VT 11, AUC 521 - no change 1/22 VP/VT 29/14 AUC 542 - adjusted to 1000mg  q12h 1/26: VP/VT 24/9 AUC 420 - adjusted to 1250 mg q12h  Microbiology results: 1/12 abscess cx - MRSA  Leverne Amrhein A. , PharmD, BCPS, FNKF Clinical Pharmacist [place] Please utilize Amion for appropriate phone number to reach the unit pharmacist [facility])

## 2019-10-19 NOTE — Progress Notes (Signed)
PROGRESS NOTE    Colleen Ramirez   IEP:[id number]  DOB: 20-Apr-1981  DOA: 09/29/2019 PCP: Patient, No Pcp Per   Brief Narrative:  Colleen Ramirez  is an 39 y.o. female past medical history of IV drugs (methamphetamine) anemia and satiety status post gastric bypass and homeless who presents with a 2-day history of severe sharp neck pain constant.  She was just recently hospitalized and discharged on 08/25/2019 for facial cellulitis was on doxycycline twice daily for which she completed a 10-day course and clindamycin topical.  She underwent ACDF by neurosurgery on 09/29/2019. Intraperitoneal culture grew MRSA she is currently on IV vancomycin and rifampin and will likely remain inpatient for the duration of her IV therapy due to her IVDU history.   Subjective: Pain in her neck limits her ROM. Poor appetite. Very weak.     Assessment & Plan:   Principal Problem:   Abscess in epidural space of cervical spine/ IVDU (intravenous drug user) - ACDF by neurosurgery in 09/29/2019- MRSA in intra-op cultures - cont Vanc until course completed on 2/22 followed by Doxycycline for at least 1 month - on Rifampin as well -cont pain control with Neurontin, Dilaudid, Flexeril, Oxycodone and Lidoderm patch -cont PT   Active Problems:   Depression/ anxiety and ADD - resuming Adderal, Welbutrin, Abilify and Valium today     Homeless - social services requested to help   Nicotine abuse - cont Nicotine patch    Status post gastric bypass for obesity   Hepatitis C antibody positive in blood   Time spent in minutes: 35 DVT prophylaxis: lovenox Code Status: full code Family Communication:  Disposition Plan: cont IV antibiotics in hospital until 2/22 Consultants:   ID Procedures:   ACDF  PICC line Antimicrobials:  Anti-infectives (From admission, onward)   Start     Dose/Rate Route Frequency Ordered Stop   10/14/19 1000  vancomycin (VANCOREADY) IVPB 1250 mg/250 mL     1,250 mg 166.7 mL/hr  over 90 Minutes Intravenous Every 12 hours 10/14/19 0540     10/13/19 0945  Vancomycin (VANCOCIN) 1,250 mg in sodium chloride 0.9 % 250 mL IVPB  Status:  Discontinued     1,250 mg 166.7 mL/hr over 90 Minutes Intravenous Every 12 hours 10/13/19 0935 10/14/19 0540   10/13/19 0930  Vancomycin (VANCOCIN) 1,250 mg in sodium chloride 0.9 % 250 mL IVPB  Status:  Discontinued     1,250 mg 166.7 mL/hr over 90 Minutes Intravenous Every 12 hours 10/13/19 0902 10/13/19 0934   10/09/19 2000  vancomycin (VANCOCIN) IVPB 1000 mg/200 mL premix  Status:  Discontinued     1,000 mg 200 mL/hr over 60 Minutes Intravenous Every 12 hours 10/09/19 1121 10/13/19 0902   10/04/19 2200  vancomycin (VANCOCIN) IVPB 750 mg/150 ml premix  Status:  Discontinued     750 mg 150 mL/hr over 60 Minutes Intravenous Every 8 hours 10/04/19 1305 10/09/19 1121   10/04/19 1400  vancomycin (VANCOCIN) IVPB 1000 mg/200 mL premix     1,000 mg 200 mL/hr over 60 Minutes Intravenous STAT 10/04/19 1305 10/04/19 1604   10/01/19 1100  rifampin (RIFADIN) capsule 300 mg     300 mg Oral Every 12 hours 10/01/19 1056     09/30/19 1800  Vancomycin (VANCOCIN) 1,250 mg in sodium chloride 0.9 % 250 mL IVPB  Status:  Discontinued     1,250 mg 166.7 mL/hr over 90 Minutes Intravenous Every 24 hours 09/30/19 0003 09/30/19 0747   09/30/19 1800  vancomycin (VANCOREADY) IVPB  1250 mg/250 mL  Status:  Discontinued     1,250 mg 166.7 mL/hr over 90 Minutes Intravenous Every 24 hours 09/30/19 0746 10/04/19 1305   09/30/19 0400  piperacillin-tazobactam (ZOSYN) IVPB 3.375 g  Status:  Discontinued     3.375 g 12.5 mL/hr over 240 Minutes Intravenous Every 8 hours 09/30/19 0003 09/30/19 1533   09/29/19 2145  piperacillin-tazobactam (ZOSYN) IVPB 3.375 g     3.375 g 12.5 mL/hr over 240 Minutes Intravenous STAT 09/29/19 2137 09/29/19 2148   09/29/19 2119  bacitracin 50,000 Units in sodium chloride 0.9 % 500 mL irrigation  Status:  Discontinued       As needed 09/29/19  2119 09/29/19 2249   09/29/19 2033  vancomycin (VANCOCIN) 1-5 GM/200ML-% IVPB    Note to Pharmacy: Toney Sang   : cabinet override      09/29/19 2033 09/30/19 0844       Objective: Vitals:   10/18/19 2000 10/19/19 0430 10/19/19 0822 10/19/19 1516  BP: 106/85 111/83 106/84 111/82  Pulse: 100 90 97 98  Resp: 14 16 17 18   Temp:    98.2 F (36.8 C)  TempSrc:    Oral  SpO2: 99% 99% 98% 98%  Weight:      Height:       No intake or output data in the 24 hours ending 10/19/19 1706 Filed Weights   09/29/19 2032  Weight: 49.9 kg    Examination: General exam: Appears comfortable  HEENT: PERRLA, oral mucosa moist, no sclera icterus or thrush Respiratory system: Clear to auscultation. Respiratory effort normal. Cardiovascular system: S1 & S2 heard, RRR.   Gastrointestinal system: Abdomen soft, non-tender, nondistended. Normal bowel sounds. Central nervous system: Alert and oriented. No focal neurological deficits. Extremities: No cyanosis, clubbing or edema Skin: No rashes or ulcers Psychiatry:  Mood & affect appropriate.     Data Reviewed: I have personally reviewed following labs and imaging studies  CBC: Recent Labs  Lab 10/14/19 0605  WBC 8.9  NEUTROABS 5.6  HGB 11.5*  HCT 35.9*  MCV 88.6  PLT 638*   Basic Metabolic Panel: Recent Labs  Lab 10/15/19 0556 10/18/19 0537  NA 139 137  K 4.3 4.0  CL 101 102  CO2 29 23  GLUCOSE 96 159*  BUN 10 12  CREATININE 0.61 0.65  CALCIUM 9.2 9.0   GFR: Estimated Creatinine Clearance: 74.4 mL/min (by C-G formula based on SCr of 0.65 mg/dL). Liver Function Tests: No results for input(s): AST, ALT, ALKPHOS, BILITOT, PROT, ALBUMIN in the last 168 hours. No results for input(s): LIPASE, AMYLASE in the last 168 hours. No results for input(s): AMMONIA in the last 168 hours. Coagulation Profile: No results for input(s): INR, PROTIME in the last 168 hours. Cardiac Enzymes: No results for input(s): CKTOTAL, CKMB,  CKMBINDEX, TROPONINI in the last 168 hours. BNP (last 3 results) No results for input(s): PROBNP in the last 8760 hours. HbA1C: No results for input(s): HGBA1C in the last 72 hours. CBG: No results for input(s): GLUCAP in the last 168 hours. Lipid Profile: No results for input(s): CHOL, HDL, LDLCALC, TRIG, CHOLHDL, LDLDIRECT in the last 72 hours. Thyroid Function Tests: No results for input(s): TSH, T4TOTAL, FREET4, T3FREE, THYROIDAB in the last 72 hours. Anemia Panel: No results for input(s): VITAMINB12, FOLATE, FERRITIN, TIBC, IRON, RETICCTPCT in the last 72 hours. Urine analysis:    Component Value Date/Time   COLORURINE YELLOW 07/24/2019 0637   APPEARANCEUR HAZY (A) 07/24/2019 0637   LABSPEC >1.030 (H)  07/24/2019 0637   PHURINE 6.0 07/24/2019 0637   GLUCOSEU NEGATIVE 07/24/2019 0637   HGBUR NEGATIVE 07/24/2019 0637   BILIRUBINUR NEGATIVE 07/24/2019 0637   KETONESUR NEGATIVE 07/24/2019 0637   PROTEINUR 30 (A) 07/24/2019 0637   NITRITE NEGATIVE 07/24/2019 0637   LEUKOCYTESUR NEGATIVE 07/24/2019 0637   Sepsis Labs: @LABRCNTIP (procalcitonin:4,lacticidven:4) )No results found for this or any previous visit (from the past 240 hour(s)).       Radiology Studies: No results found.    Scheduled Meds: . amphetamine-dextroamphetamine  20 mg Oral Daily  . ARIPiprazole  20 mg Oral Daily  . buPROPion  150 mg Oral Daily  . Chlorhexidine Gluconate Cloth  6 each Topical Q0600  . enoxaparin (LOVENOX) injection  40 mg Subcutaneous Q24H  . feeding supplement (ENSURE ENLIVE)  237 mL Oral BID BM  . gabapentin  100 mg Oral TID  . lidocaine  1 patch Transdermal Daily  . multivitamin with minerals  1 tablet Oral Daily  . nicotine  21 mg Transdermal Daily  . rifampin  300 mg Oral Q12H  . senna-docusate  1 tablet Oral BID  . sodium chloride flush  3 mL Intravenous Q12H   Continuous Infusions: . sodium chloride 250 mL (10/15/19 2134)  . vancomycin 1,250 mg (10/19/19 0927)     LOS:  20 days      12/17/19, MD [facility] Pager: [email or web address] Password [facility] 10/19/2019, 5:06 PM

## 2019-10-19 NOTE — Progress Notes (Addendum)
Subjective: Patient complaining of intermittent neck pain and shoulder pain   Antibiotics:  Anti-infectives (From admission, onward)   Start     Dose/Rate Route Frequency Ordered Stop   10/14/19 1000  vancomycin (VANCOREADY) IVPB 1250 mg/250 mL     1,250 mg 166.7 mL/hr over 90 Minutes Intravenous Every 12 hours 10/14/19 0540     10/13/19 0945  Vancomycin (VANCOCIN) 1,250 mg in sodium chloride 0.9 % 250 mL IVPB  Status:  Discontinued     1,250 mg 166.7 mL/hr over 90 Minutes Intravenous Every 12 hours 10/13/19 0935 10/14/19 0540   10/13/19 0930  Vancomycin (VANCOCIN) 1,250 mg in sodium chloride 0.9 % 250 mL IVPB  Status:  Discontinued     1,250 mg 166.7 mL/hr over 90 Minutes Intravenous Every 12 hours 10/13/19 0902 10/13/19 0934   10/09/19 2000  vancomycin (VANCOCIN) IVPB 1000 mg/200 mL premix  Status:  Discontinued     1,000 mg 200 mL/hr over 60 Minutes Intravenous Every 12 hours 10/09/19 1121 10/13/19 0902   10/04/19 2200  vancomycin (VANCOCIN) IVPB 750 mg/150 ml premix  Status:  Discontinued     750 mg 150 mL/hr over 60 Minutes Intravenous Every 8 hours 10/04/19 1305 10/09/19 1121   10/04/19 1400  vancomycin (VANCOCIN) IVPB 1000 mg/200 mL premix     1,000 mg 200 mL/hr over 60 Minutes Intravenous STAT 10/04/19 1305 10/04/19 1604   10/01/19 1100  rifampin (RIFADIN) capsule 300 mg     300 mg Oral Every 12 hours 10/01/19 1056     09/30/19 1800  Vancomycin (VANCOCIN) 1,250 mg in sodium chloride 0.9 % 250 mL IVPB  Status:  Discontinued     1,250 mg 166.7 mL/hr over 90 Minutes Intravenous Every 24 hours 09/30/19 0003 09/30/19 0747   09/30/19 1800  vancomycin (VANCOREADY) IVPB 1250 mg/250 mL  Status:  Discontinued     1,250 mg 166.7 mL/hr over 90 Minutes Intravenous Every 24 hours 09/30/19 0746 10/04/19 1305   09/30/19 0400  piperacillin-tazobactam (ZOSYN) IVPB 3.375 g  Status:  Discontinued     3.375 g 12.5 mL/hr over 240 Minutes Intravenous Every 8 hours 09/30/19 0003  09/30/19 1533   09/29/19 2145  piperacillin-tazobactam (ZOSYN) IVPB 3.375 g     3.375 g 12.5 mL/hr over 240 Minutes Intravenous STAT 09/29/19 2137 09/29/19 2148   09/29/19 2119  bacitracin 50,000 Units in sodium chloride 0.9 % 500 mL irrigation  Status:  Discontinued       As needed 09/29/19 2119 09/29/19 2249   09/29/19 2033  vancomycin (VANCOCIN) 1-5 GM/200ML-% IVPB    Note to Pharmacy: Ubaldo Glassing   : cabinet override      09/29/19 2033 09/30/19 0844      Medications: Scheduled Meds: . amphetamine-dextroamphetamine  20 mg Oral Daily  . ARIPiprazole  20 mg Oral Daily  . buPROPion  150 mg Oral Daily  . Chlorhexidine Gluconate Cloth  6 each Topical Q0600  . enoxaparin (LOVENOX) injection  40 mg Subcutaneous Q24H  . feeding supplement (ENSURE ENLIVE)  237 mL Oral BID BM  . gabapentin  100 mg Oral TID  . lidocaine  1 patch Transdermal Daily  . multivitamin with minerals  1 tablet Oral Daily  . nicotine  21 mg Transdermal Daily  . rifampin  300 mg Oral Q12H  . senna-docusate  1 tablet Oral BID  . sodium chloride flush  3 mL Intravenous Q12H   Continuous Infusions: . sodium chloride 250 mL (  10/15/19 2134)  . vancomycin 1,250 mg (10/19/19 0927)   PRN Meds:.acetaminophen **OR** acetaminophen, cyclobenzaprine, diazepam, HYDROmorphone (DILAUDID) injection, menthol-cetylpyridinium **OR** phenol, metoprolol tartrate, ondansetron **OR** ondansetron (ZOFRAN) IV, oxyCODONE, polyethylene glycol, sodium chloride flush, traZODone    Objective: Weight change:   Intake/Output Summary (Last 24 hours) at 10/19/2019 1432 Last data filed at 10/18/2019 1700 Gross per 24 hour  Intake 360 ml  Output --  Net 360 ml   Blood pressure 106/84, pulse 97, temperature 98.3 F (36.8 C), temperature source Oral, resp. rate 17, height 5\' 3"  (1.6 m), weight 49.9 kg, SpO2 98 %. Pulse Rate:  [90-100] 97 (02/01 0822) Resp:  [14-17] 17 (02/01 0822) BP: (106-111)/(83-85) 106/84 (02/01 0822) SpO2:  [98 %-99  %] 98 % (02/01 06-04-1981)  Physical Exam: General: Alert and awake, oriented x3, not in any acute distress. HEENT: anicteric sclera, EOMI CVS regular rate, normal  Chest: , no wheezing, no respiratory distress Abdomen: soft non-distended,  Extremities: no edema or deformity noted bilaterally Skin: no rashes Neuro: nonfocal  CBC:    BMET Recent Labs    10/18/19 0537  NA 137  K 4.0  CL 102  CO2 23  GLUCOSE 159*  BUN 12  CREATININE 0.65  CALCIUM 9.0     Liver Panel  No results for input(s): PROT, ALBUMIN, AST, ALT, ALKPHOS, BILITOT, BILIDIR, IBILI in the last 72 hours.     Sedimentation Rate No results for input(s): ESRSEDRATE in the last 72 hours. C-Reactive Protein No results for input(s): CRP in the last 72 hours.  Micro Results: Recent Results (from the past 720 hour(s))  SARS CORONAVIRUS 2 (TAT 6-24 HRS) Nasopharyngeal Nasopharyngeal Swab     Status: None   Collection Time: 09/29/19 11:38 AM   Specimen: Nasopharyngeal Swab  Result Value Ref Range Status   SARS Coronavirus 2 NEGATIVE NEGATIVE Final    Comment: (NOTE) SARS-CoV-2 target nucleic acids are NOT DETECTED. The SARS-CoV-2 RNA is generally detectable in upper and lower respiratory specimens during the acute phase of infection. Negative results do not preclude SARS-CoV-2 infection, do not rule out co-infections with other pathogens, and should not be used as the sole basis for treatment or other patient management decisions. Negative results must be combined with clinical observations, patient history, and epidemiological information. The expected result is Negative. Fact Sheet for Patients: 11/27/19 Fact Sheet for Healthcare Providers: HairSlick.no This test is not yet approved or cleared by the quierodirigir.com FDA and  has been authorized for detection and/or diagnosis of SARS-CoV-2 by FDA under an Emergency Use Authorization (EUA). This  EUA will remain  in effect (meaning this test can be used) for the duration of the COVID-19 declaration under Section 56 4(b)(1) of the Act, 21 U.S.C. section 360bbb-3(b)(1), unless the authorization is terminated or revoked sooner. Performed at [facility], [address], [place], [place]   Aerobic/Anaerobic Culture (surgical/deep wound)     Status: None   Collection Time: 09/29/19  9:43 PM   Specimen: Abscess  Result Value Ref Range Status   Specimen Description ABSCESS  Final   Special Requests CERVICAL EPIDURAL  Final   Gram Stain   Final    RARE WBC PRESENT, PREDOMINANTLY PMN FEW GRAM POSITIVE COCCI IN CLUSTERS    Culture   Final    ABUNDANT METHICILLIN RESISTANT STAPHYLOCOCCUS AUREUS NO ANAEROBES ISOLATED Performed at [facility], [address], [place], [place]    Report Status 10/05/2019 FINAL  Final  Organism ID, Bacteria METHICILLIN RESISTANT STAPHYLOCOCCUS AUREUS  Final      Susceptibility   Methicillin resistant staphylococcus aureus - MIC*    CIPROFLOXACIN >=8 RESISTANT Resistant     ERYTHROMYCIN >=8 RESISTANT Resistant     GENTAMICIN <=0.5 SENSITIVE Sensitive     OXACILLIN >=4 RESISTANT Resistant     TETRACYCLINE <=1 SENSITIVE Sensitive     VANCOMYCIN <=0.5 SENSITIVE Sensitive     TRIMETH/SULFA 80 RESISTANT Resistant     CLINDAMYCIN >=8 RESISTANT Resistant     RIFAMPIN <=0.5 SENSITIVE Sensitive     Inducible Clindamycin NEGATIVE Sensitive     * ABUNDANT METHICILLIN RESISTANT STAPHYLOCOCCUS AUREUS  MRSA PCR Screening     Status: Abnormal   Collection Time: 10/01/19  6:23 PM   Specimen: Nasal Mucosa; Nasopharyngeal  Result Value Ref Range Status   MRSA by PCR POSITIVE (A) NEGATIVE Final    Comment:        The GeneXpert MRSA Assay (FDA approved for NASAL specimens only), is one component of a comprehensive MRSA colonization surveillance program. It is not intended to diagnose MRSA infection nor to guide or monitor  treatment for MRSA infections. RESULT CALLED TO, READ BACK BY AND VERIFIED WITH: A NEZIUS RN 10/01/19 2000 JDW Performed at [facility], [address] [place], [place] [address]     Studies/Results: No results found.    Assessment/Plan:  INTERVAL HISTORY: No new significant events   Principal Problem:   Infection of cervical spine (HCC) Active Problems:   Sepsis (HCC)   Depression   Acquired fusion of cervical spine   IVDU (intravenous drug user)   Homeless   S/P cervical discectomy   Anxiety   Status post gastric bypass for obesity   Hepatitis C antibody positive in blood   Staph aureus infection    Colleen Ramirez is a 39 y.o. female with  Hx of IVDU who had cervical abscess an dis sp C5-C6 cervical discectomy and fusion   #1 C spine infectionn:  Would complete 2 weeks of postoperative vancomycin which would finish on February 22.  Would then discharge on oral doxycycline 100mg  bid  for at least a month with follow-up with in our clinic.   Jessee Newnam has an appointment on March 3 at 2:45 PM with 08-01-1986  The [facility] for Infectious Disease is located in the [facility] at  [address] [place] in [place].  [address], which is located to the left of the elevators.  Phone: [phone]  Fax: [phone]  [email or web address]  She should arrive 15 minutes prior to her appointment.    Please call with further questions, I will sign off for now.    LOS: 20 days   [phone] 10/19/2019, 2:32 PM

## 2019-10-19 NOTE — Plan of Care (Signed)
  Problem: Education: Goal: Knowledge of General Education information will improve Description: Including pain rating scale, medication(s)/side effects and non-pharmacologic comfort measures Outcome: Progressing   Problem: Activity: Goal: Risk for activity intolerance will decrease Outcome: Progressing   Problem: Nutrition: Goal: Adequate nutrition will be maintained Outcome: Progressing   

## 2019-10-20 LAB — VANCOMYCIN, TROUGH: Vancomycin Tr: 11 ug/mL — ABNORMAL LOW (ref 15–20)

## 2019-10-20 LAB — VANCOMYCIN, PEAK: Vancomycin Pk: 26 ug/mL — ABNORMAL LOW (ref 30–40)

## 2019-10-20 MED ORDER — AMPHETAMINE-DEXTROAMPHETAMINE 10 MG PO TABS
30.0000 mg | ORAL_TABLET | Freq: Two times a day (BID) | ORAL | Status: DC
  Administered 2019-10-20 – 2019-12-11 (×104): 30 mg via ORAL
  Filled 2019-10-20 (×104): qty 3

## 2019-10-20 NOTE — Progress Notes (Signed)
PROGRESS NOTE    Colleen Ramirez   WUX:[id number]  DOB: 1981-09-02  DOA: 09/29/2019 PCP: Patient, No Pcp Per   Brief Narrative:  Colleen Ramirez  is an 39 y.o. female past medical history of IV drugs (methamphetamine) anemia and satiety status post gastric bypass and homeless who presents with a 2-day history of severe sharp neck pain constant.  She was just recently hospitalized and discharged on 08/25/2019 for facial cellulitis was on doxycycline twice daily for which she completed a 10-day course and clindamycin topical.  She underwent ACDF by neurosurgery on 09/29/2019. Intraperitoneal culture grew MRSA she is currently on IV vancomycin and rifampin and will likely remain inpatient for the duration of her IV therapy due to her IVDU history.   Subjective: Sitting up in a chair today. Still having quite a bit of pain but tolerating it.     Assessment & Plan:   Principal Problem:   Abscess in epidural space of cervical spine/ IVDU (intravenous drug user) - ACDF by neurosurgery in 09/29/2019- MRSA in intra-op cultures - cont Vanc and Rifampin until course completed on 2/22 followed by Doxycycline for at least 1 month -cont pain control with Neurontin, Dilaudid, Flexeril, Oxycodone and Lidoderm patch- will need to wean narcotics so she does not go into withdrawal from an abrupt stop on discharged -cont PT   Active Problems:   Depression/ anxiety and ADD - resuming Adderal, Welbutrin, Abilify and Valium      Homeless - social services requested to help   Nicotine abuse - cont Nicotine patch    Status post gastric bypass for obesity    Hepatitis C antibody positive    Time spent in minutes: 35 DVT prophylaxis: lovenox Code Status: full code Family Communication:  Disposition Plan: cont IV antibiotics in hospital until 2/22 Consultants:   ID Procedures:   ACDF  PICC line Antimicrobials:  Anti-infectives (From admission, onward)   Start     Dose/Rate Route Frequency  Ordered Stop   10/14/19 1000  vancomycin (VANCOREADY) IVPB 1250 mg/250 mL     1,250 mg 166.7 mL/hr over 90 Minutes Intravenous Every 12 hours 10/14/19 0540     10/13/19 0945  Vancomycin (VANCOCIN) 1,250 mg in sodium chloride 0.9 % 250 mL IVPB  Status:  Discontinued     1,250 mg 166.7 mL/hr over 90 Minutes Intravenous Every 12 hours 10/13/19 0935 10/14/19 0540   10/13/19 0930  Vancomycin (VANCOCIN) 1,250 mg in sodium chloride 0.9 % 250 mL IVPB  Status:  Discontinued     1,250 mg 166.7 mL/hr over 90 Minutes Intravenous Every 12 hours 10/13/19 0902 10/13/19 0934   10/09/19 2000  vancomycin (VANCOCIN) IVPB 1000 mg/200 mL premix  Status:  Discontinued     1,000 mg 200 mL/hr over 60 Minutes Intravenous Every 12 hours 10/09/19 1121 10/13/19 0902   10/04/19 2200  vancomycin (VANCOCIN) IVPB 750 mg/150 ml premix  Status:  Discontinued     750 mg 150 mL/hr over 60 Minutes Intravenous Every 8 hours 10/04/19 1305 10/09/19 1121   10/04/19 1400  vancomycin (VANCOCIN) IVPB 1000 mg/200 mL premix     1,000 mg 200 mL/hr over 60 Minutes Intravenous STAT 10/04/19 1305 10/04/19 1604   10/01/19 1100  rifampin (RIFADIN) capsule 300 mg     300 mg Oral Every 12 hours 10/01/19 1056     09/30/19 1800  Vancomycin (VANCOCIN) 1,250 mg in sodium chloride 0.9 % 250 mL IVPB  Status:  Discontinued     1,250 mg 166.7  mL/hr over 90 Minutes Intravenous Every 24 hours 09/30/19 0003 09/30/19 0747   09/30/19 1800  vancomycin (VANCOREADY) IVPB 1250 mg/250 mL  Status:  Discontinued     1,250 mg 166.7 mL/hr over 90 Minutes Intravenous Every 24 hours 09/30/19 0746 10/04/19 1305   09/30/19 0400  piperacillin-tazobactam (ZOSYN) IVPB 3.375 g  Status:  Discontinued     3.375 g 12.5 mL/hr over 240 Minutes Intravenous Every 8 hours 09/30/19 0003 09/30/19 1533   09/29/19 2145  piperacillin-tazobactam (ZOSYN) IVPB 3.375 g     3.375 g 12.5 mL/hr over 240 Minutes Intravenous STAT 09/29/19 2137 09/29/19 2148   09/29/19 2119  bacitracin  50,000 Units in sodium chloride 0.9 % 500 mL irrigation  Status:  Discontinued       As needed 09/29/19 2119 09/29/19 2249   09/29/19 2033  vancomycin (VANCOCIN) 1-5 GM/200ML-% IVPB    Note to Pharmacy: Toney Sang   : cabinet override      09/29/19 2033 09/30/19 0844       Objective: Vitals:   10/19/19 2037 10/20/19 0512 10/20/19 0848 10/20/19 1440  BP: 119/85 112/87 118/87 113/81  Pulse: (!) 115 95 (!) 102 95  Resp: 16 16 16 17   Temp: 98.5 F (36.9 C) 98.1 F (36.7 C) (!) 97.5 F (36.4 C) 98.6 F (37 C)  TempSrc: Oral Oral Oral Oral  SpO2: 98% 100% 100% 99%  Weight:      Height:        Intake/Output Summary (Last 24 hours) at 10/20/2019 1451 Last data filed at 10/20/2019 0900 Gross per 24 hour  Intake 360 ml  Output --  Net 360 ml   Filed Weights   09/29/19 2032  Weight: 49.9 kg    Examination: General exam: Appears comfortable  HEENT: PERRLA, oral mucosa moist, no sclera icterus or thrush- incision on neck noted- decreased ROM in neck Respiratory system: Clear to auscultation. Respiratory effort normal. Cardiovascular system: S1 & S2 heard,  No murmurs  Gastrointestinal system: Abdomen soft, non-tender, nondistended. Normal bowel sounds   Central nervous system: Alert and oriented. No focal neurological deficits. Extremities: No cyanosis, clubbing or edema Skin: No rashes or ulcers Psychiatry:  Mood & affect appropriate.     Data Reviewed: I have personally reviewed following labs and imaging studies  CBC: Recent Labs  Lab 10/14/19 0605  WBC 8.9  NEUTROABS 5.6  HGB 11.5*  HCT 35.9*  MCV 88.6  PLT 638*   Basic Metabolic Panel: Recent Labs  Lab 10/15/19 0556 10/18/19 0537  NA 139 137  K 4.3 4.0  CL 101 102  CO2 29 23  GLUCOSE 96 159*  BUN 10 12  CREATININE 0.61 0.65  CALCIUM 9.2 9.0   GFR: Estimated Creatinine Clearance: 74.4 mL/min (by C-G formula based on SCr of 0.65 mg/dL). Liver Function Tests: No results for input(s): AST, ALT,  ALKPHOS, BILITOT, PROT, ALBUMIN in the last 168 hours. No results for input(s): LIPASE, AMYLASE in the last 168 hours. No results for input(s): AMMONIA in the last 168 hours. Coagulation Profile: No results for input(s): INR, PROTIME in the last 168 hours. Cardiac Enzymes: No results for input(s): CKTOTAL, CKMB, CKMBINDEX, TROPONINI in the last 168 hours. BNP (last 3 results) No results for input(s): PROBNP in the last 8760 hours. HbA1C: No results for input(s): HGBA1C in the last 72 hours. CBG: No results for input(s): GLUCAP in the last 168 hours. Lipid Profile: No results for input(s): CHOL, HDL, LDLCALC, TRIG, CHOLHDL, LDLDIRECT in the  last 72 hours. Thyroid Function Tests: No results for input(s): TSH, T4TOTAL, FREET4, T3FREE, THYROIDAB in the last 72 hours. Anemia Panel: No results for input(s): VITAMINB12, FOLATE, FERRITIN, TIBC, IRON, RETICCTPCT in the last 72 hours. Urine analysis:    Component Value Date/Time   COLORURINE YELLOW 07/24/2019 0637   APPEARANCEUR HAZY (A) 07/24/2019 0637   LABSPEC >1.030 (H) 07/24/2019 0637   PHURINE 6.0 07/24/2019 0637   GLUCOSEU NEGATIVE 07/24/2019 0637   HGBUR NEGATIVE 07/24/2019 0637   BILIRUBINUR NEGATIVE 07/24/2019 0637   KETONESUR NEGATIVE 07/24/2019 0637   PROTEINUR 30 (A) 07/24/2019 0637   NITRITE NEGATIVE 07/24/2019 0637   LEUKOCYTESUR NEGATIVE 07/24/2019 0637   Sepsis Labs: @LABRCNTIP (procalcitonin:4,lacticidven:4) )No results found for this or any previous visit (from the past 240 hour(s)).       Radiology Studies: No results found.    Scheduled Meds: . amphetamine-dextroamphetamine  30 mg Oral BID  . ARIPiprazole  20 mg Oral Daily  . buPROPion  150 mg Oral Daily  . Chlorhexidine Gluconate Cloth  6 each Topical Q0600  . enoxaparin (LOVENOX) injection  40 mg Subcutaneous Q24H  . feeding supplement (ENSURE ENLIVE)  237 mL Oral BID BM  . gabapentin  100 mg Oral TID  . lidocaine  1 patch Transdermal Daily  .  multivitamin with minerals  1 tablet Oral Daily  . nicotine  14 mg Transdermal Daily  . rifampin  300 mg Oral Q12H  . senna-docusate  1 tablet Oral BID  . sodium chloride flush  3 mL Intravenous Q12H   Continuous Infusions: . sodium chloride Stopped (10/19/19 1738)  . vancomycin 1,250 mg (10/20/19 1014)     LOS: 21 days      12/18/19, MD [facility] Pager: [email or web address] Password [email or web address] 10/20/2019, 2:51 PM

## 2019-10-20 NOTE — Plan of Care (Signed)

## 2019-10-20 NOTE — Progress Notes (Signed)
Pharmacy Antibiotic Note  Colleen Ramirez is a 39 y.o. female admitted on 09/29/2019 with MRSA cervical spine infection. Pharmacy has been consulted for Vancomycin dosing. Also on rifampin. Patient remains afebrile with stable renal function.  Vancomycin levels this evening remain therapeutic (AUC 480). Cr has been stable,   Plan: - Continue Vancomycin 1250 mg IV Q12 hrs - Rifampin 300mg  PO q12h - Watch renal function. - ID planning 6 weeks of treatment  Height: 5\' 3"  (160 cm) Weight: 110 lb (49.9 kg) IBW/kg (Calculated) : 52.4  Temp (24hrs), Avg:98 F (36.7 C), Min:97.5 F (36.4 C), Max:98.6 F (37 C)  Recent Labs  Lab 10/14/19 0605 10/15/19 0556 10/18/19 0537 10/20/19 1403 10/20/19 2131  WBC 8.9  --   --   --   --   CREATININE  --  0.61 0.65  --   --   VANCOTROUGH  --   --   --   --  11*  VANCOPEAK  --   --   --  26*  --     Estimated Creatinine Clearance: 74.4 mL/min (by C-G formula based on SCr of 0.65 mg/dL).    No Known Allergies  Antimicrobials this admission: Zosyn 1/12 >> 1/13 Vanc 1/12 >> Rifampin 1/14 >>  Dose adjustments this admission: 1/16 & 1/17: 21 and <4 - 1g x 1 and adj to 750 mg/8h 1/19: VP 32, VT 11, AUC 521 - no change 1/22 VP/VT 29/14 AUC 542 - adjusted to 1000mg  q12h 1/26: VP/VT 24/9 AUC 420 - adjusted to 1250 mg q12h  Microbiology results: 1/12 abscess cx - MRSA  , PharmD, BCPS Clinical Pharmacist [phone] Please check AMION for all [facility] numbers 10/20/2019

## 2019-10-21 LAB — CBC WITH DIFFERENTIAL/PLATELET
Abs Immature Granulocytes: 0.03 10*3/uL (ref 0.00–0.07)
Basophils Absolute: 0.1 10*3/uL (ref 0.0–0.1)
Basophils Relative: 1 %
Eosinophils Absolute: 0.2 10*3/uL (ref 0.0–0.5)
Eosinophils Relative: 3 %
HCT: 34.7 % — ABNORMAL LOW (ref 36.0–46.0)
Hemoglobin: 11.2 g/dL — ABNORMAL LOW (ref 12.0–15.0)
Immature Granulocytes: 1 %
Lymphocytes Relative: 28 %
Lymphs Abs: 1.6 10*3/uL (ref 0.7–4.0)
MCH: 28.4 pg (ref 26.0–34.0)
MCHC: 32.3 g/dL (ref 30.0–36.0)
MCV: 87.8 fL (ref 80.0–100.0)
Monocytes Absolute: 0.7 10*3/uL (ref 0.1–1.0)
Monocytes Relative: 11 %
Neutro Abs: 3.3 10*3/uL (ref 1.7–7.7)
Neutrophils Relative %: 56 %
Platelets: 360 10*3/uL (ref 150–400)
RBC: 3.95 MIL/uL (ref 3.87–5.11)
RDW: 15.5 % (ref 11.5–15.5)
WBC: 5.8 10*3/uL (ref 4.0–10.5)
nRBC: 0 % (ref 0.0–0.2)

## 2019-10-21 LAB — BASIC METABOLIC PANEL
Anion gap: 8 (ref 5–15)
BUN: 11 mg/dL (ref 6–20)
CO2: 28 mmol/L (ref 22–32)
Calcium: 8.9 mg/dL (ref 8.9–10.3)
Chloride: 102 mmol/L (ref 98–111)
Creatinine, Ser: 0.5 mg/dL (ref 0.44–1.00)
GFR calc Af Amer: >60 mL/min (ref >60)
GFR calc non Af Amer: >60 mL/min (ref >60)
Glucose, Bld: 93 mg/dL (ref 70–99)
Potassium: 4 mmol/L (ref 3.5–5.1)
Sodium: 138 mmol/L (ref 135–145)

## 2019-10-21 NOTE — Progress Notes (Signed)
PROGRESS NOTE    Colleen Ramirez   TDV:[id number]  DOB: [birth year]  DOA: 09/29/2019 PCP: Patient, No Pcp Per   Brief Narrative:  Colleen Ramirez  is an 39 y.o. female past medical history of IV drugs (methamphetamine) anemia and satiety status post gastric bypass and homeless who presents with a 2-day history of severe sharp neck pain constant.  She was just recently hospitalized and discharged on 08/25/2019 for facial cellulitis was on doxycycline twice daily for which she completed a 10-day course and clindamycin topical.  She underwent ACDF by neurosurgery on 09/29/2019. Intraperitoneal culture grew MRSA she is currently on IV vancomycin and rifampin and will likely remain inpatient for the duration of her IV therapy due to her IVDU history.   Subjective: Sitting up in chair again. Showered herself yesterday and now back and arm muscles are sore. Has occasional sharp neck pain and does not feel she is ready to stop the Dilaudid yet.     Assessment & Plan:   Principal Problem:   Abscess in epidural space of cervical spine/ IVDU (intravenous drug user) - ACDF by neurosurgery in 09/29/2019- MRSA in intra-op cultures - cont Vanc and Rifampin until course completed on 2/22 followed by Doxycycline for at least 1 month -cont pain control with Neurontin, Dilaudid, Flexeril, Oxycodone and Lidoderm patch- will need to wean narcotics so she does not go into withdrawal from an abrupt stop on discharged -cont PT - OK to get wound wet per Dr Maurice Small whom I texted today   Active Problems:   Depression/ anxiety and ADD - resumed Adderal 30 bid, Welbutrin, Abilify and Valium      Homeless - social services requested to help   Nicotine abuse - cont Nicotine patch    Status post gastric bypass for obesity    Hepatitis C antibody positive    Time spent in minutes: 35 DVT prophylaxis: lovenox Code Status: full code Family Communication:  Disposition Plan: cont IV antibiotics in hospital  until 2/22 Consultants:   ID Procedures:   ACDF  PICC line Antimicrobials:  Anti-infectives (From admission, onward)   Start     Dose/Rate Route Frequency Ordered Stop   10/14/19 1000  vancomycin (VANCOREADY) IVPB 1250 mg/250 mL     1,250 mg 166.7 mL/hr over 90 Minutes Intravenous Every 12 hours 10/14/19 0540     10/13/19 0945  Vancomycin (VANCOCIN) 1,250 mg in sodium chloride 0.9 % 250 mL IVPB  Status:  Discontinued     1,250 mg 166.7 mL/hr over 90 Minutes Intravenous Every 12 hours 10/13/19 0935 10/14/19 0540   10/13/19 0930  Vancomycin (VANCOCIN) 1,250 mg in sodium chloride 0.9 % 250 mL IVPB  Status:  Discontinued     1,250 mg 166.7 mL/hr over 90 Minutes Intravenous Every 12 hours 10/13/19 0902 10/13/19 0934   10/09/19 2000  vancomycin (VANCOCIN) IVPB 1000 mg/200 mL premix  Status:  Discontinued     1,000 mg 200 mL/hr over 60 Minutes Intravenous Every 12 hours 10/09/19 1121 10/13/19 0902   10/04/19 2200  vancomycin (VANCOCIN) IVPB 750 mg/150 ml premix  Status:  Discontinued     750 mg 150 mL/hr over 60 Minutes Intravenous Every 8 hours 10/04/19 1305 10/09/19 1121   10/04/19 1400  vancomycin (VANCOCIN) IVPB 1000 mg/200 mL premix     1,000 mg 200 mL/hr over 60 Minutes Intravenous STAT 10/04/19 1305 10/04/19 1604   10/01/19 1100  rifampin (RIFADIN) capsule 300 mg     300 mg Oral Every 12 hours  10/01/19 1056     09/30/19 1800  Vancomycin (VANCOCIN) 1,250 mg in sodium chloride 0.9 % 250 mL IVPB  Status:  Discontinued     1,250 mg 166.7 mL/hr over 90 Minutes Intravenous Every 24 hours 09/30/19 0003 09/30/19 0747   09/30/19 1800  vancomycin (VANCOREADY) IVPB 1250 mg/250 mL  Status:  Discontinued     1,250 mg 166.7 mL/hr over 90 Minutes Intravenous Every 24 hours 09/30/19 0746 10/04/19 1305   09/30/19 0400  piperacillin-tazobactam (ZOSYN) IVPB 3.375 g  Status:  Discontinued     3.375 g 12.5 mL/hr over 240 Minutes Intravenous Every 8 hours 09/30/19 0003 09/30/19 1533   09/29/19  2145  piperacillin-tazobactam (ZOSYN) IVPB 3.375 g     3.375 g 12.5 mL/hr over 240 Minutes Intravenous STAT 09/29/19 2137 09/29/19 2148   09/29/19 2119  bacitracin 50,000 Units in sodium chloride 0.9 % 500 mL irrigation  Status:  Discontinued       As needed 09/29/19 2119 09/29/19 2249   09/29/19 2033  vancomycin (VANCOCIN) 1-5 GM/200ML-% IVPB    Note to Pharmacy: Ubaldo Glassing   : cabinet override      09/29/19 2033 09/30/19 0844       Objective: Vitals:   10/20/19 0848 10/20/19 1440 10/20/19 2052 10/21/19 0818  BP: 118/87 113/81 115/88 114/86  Pulse: (!) 102 95 (!) 106 95  Resp: 16 17  17   Temp: (!) 97.5 F (36.4 C) 98.6 F (37 C) 97.6 F (36.4 C) 98.2 F (36.8 C)  TempSrc: Oral Oral Oral Oral  SpO2: 100% 99% 100% 98%  Weight:      Height:       No intake or output data in the 24 hours ending 10/21/19 1112 Filed Weights   09/29/19 2032  Weight: 49.9 kg    Examination: General exam: Appears comfortable  HEENT: PERRLA, oral mucosa moist, no sclera icterus or thrush Respiratory system: Clear to auscultation. Respiratory effort normal. Cardiovascular system: S1 & S2 heard,  No murmurs  Gastrointestinal system: Abdomen soft, non-tender, nondistended. Normal bowel sounds   Central nervous system: Alert and oriented. No focal neurological deficits. Extremities: No cyanosis, clubbing or edema MSK: severe neck stiffness Skin: No rashes or ulcers- wound on neck healing Psychiatry:  Mood & affect appropriate.     Data Reviewed: I have personally reviewed following labs and imaging studies  CBC: Recent Labs  Lab 10/21/19 0505  WBC 5.8  NEUTROABS 3.3  HGB 11.2*  HCT 34.7*  MCV 87.8  PLT 563   Basic Metabolic Panel: Recent Labs  Lab 10/15/19 0556 10/18/19 0537 10/21/19 0505  NA 139 137 138  K 4.3 4.0 4.0  CL 101 102 102  CO2 29 23 28   GLUCOSE 96 159* 93  BUN 10 12 11   CREATININE 0.61 0.65 0.50  CALCIUM 9.2 9.0 8.9   GFR: Estimated Creatinine  Clearance: 74.4 mL/min (by C-G formula based on SCr of 0.5 mg/dL). Liver Function Tests: No results for input(s): AST, ALT, ALKPHOS, BILITOT, PROT, ALBUMIN in the last 168 hours. No results for input(s): LIPASE, AMYLASE in the last 168 hours. No results for input(s): AMMONIA in the last 168 hours. Coagulation Profile: No results for input(s): INR, PROTIME in the last 168 hours. Cardiac Enzymes: No results for input(s): CKTOTAL, CKMB, CKMBINDEX, TROPONINI in the last 168 hours. BNP (last 3 results) No results for input(s): PROBNP in the last 8760 hours. HbA1C: No results for input(s): HGBA1C in the last 72 hours. CBG: No results  for input(s): GLUCAP in the last 168 hours. Lipid Profile: No results for input(s): CHOL, HDL, LDLCALC, TRIG, CHOLHDL, LDLDIRECT in the last 72 hours. Thyroid Function Tests: No results for input(s): TSH, T4TOTAL, FREET4, T3FREE, THYROIDAB in the last 72 hours. Anemia Panel: No results for input(s): VITAMINB12, FOLATE, FERRITIN, TIBC, IRON, RETICCTPCT in the last 72 hours. Urine analysis:    Component Value Date/Time   COLORURINE YELLOW 07/24/2019 0637   APPEARANCEUR HAZY (A) 07/24/2019 0637   LABSPEC >1.030 (H) 07/24/2019 0637   PHURINE 6.0 07/24/2019 0637   GLUCOSEU NEGATIVE 07/24/2019 0637   HGBUR NEGATIVE 07/24/2019 0637   BILIRUBINUR NEGATIVE 07/24/2019 0637   KETONESUR NEGATIVE 07/24/2019 0637   PROTEINUR 30 (A) 07/24/2019 0637   NITRITE NEGATIVE 07/24/2019 0637   LEUKOCYTESUR NEGATIVE 07/24/2019 0637   Sepsis Labs: @LABRCNTIP (procalcitonin:4,lacticidven:4) )No results found for this or any previous visit (from the past 240 hour(s)).       Radiology Studies: No results found.    Scheduled Meds: . amphetamine-dextroamphetamine  30 mg Oral BID  . ARIPiprazole  20 mg Oral Daily  . buPROPion  150 mg Oral Daily  . Chlorhexidine Gluconate Cloth  6 each Topical Q0600  . enoxaparin (LOVENOX) injection  40 mg Subcutaneous Q24H  . feeding  supplement (ENSURE ENLIVE)  237 mL Oral BID BM  . gabapentin  100 mg Oral TID  . lidocaine  1 patch Transdermal Daily  . multivitamin with minerals  1 tablet Oral Daily  . nicotine  14 mg Transdermal Daily  . rifampin  300 mg Oral Q12H  . senna-docusate  1 tablet Oral BID  . sodium chloride flush  3 mL Intravenous Q12H   Continuous Infusions: . sodium chloride Stopped (10/19/19 1738)  . vancomycin 1,250 mg (10/21/19 1010)     LOS: 22 days      12/19/19, MD [facility] Pager: [email or web address] Password [email or web address] 10/21/2019, 11:12 AM

## 2019-10-22 MED ORDER — GABAPENTIN 100 MG PO CAPS
200.0000 mg | ORAL_CAPSULE | Freq: Three times a day (TID) | ORAL | Status: DC
  Administered 2019-10-22 – 2019-10-24 (×7): 200 mg via ORAL
  Administered 2019-10-24: 18:00:00 100 mg via ORAL
  Administered 2019-10-25 (×3): 200 mg via ORAL
  Filled 2019-10-22 (×12): qty 2

## 2019-10-22 NOTE — Progress Notes (Signed)
Occupational Therapy Treatment Patient Details Name: Colleen Ramirez MRN: [id number] DOB: June 09, 1981 Today's Date: 10/22/2019    History of present illness Colleen Ramirez is a 39 y.o. F admitted to the ED with c/o severe neck pain secondary to a cervical epidural abscess. She is s/p C5-6 anterior cervical discectomy and instrumented fusion. PMH: IVDU, anemia, anxiety, depression, recent admission for facial cellulitis.    OT comments  Pt making good progress with functional goals. Pt admitted that she has not been consistent with UE/hand exercises for strength and coordination as instructed by OT. Re educated pt on the importance and benefits of these exerices.Provided pt with additional exercises for L wrist strength and FMC for B hands with squeeze ball and opening containers of toiletry items and using spray bottle for digit strength and coordination.   Follow Up Recommendations  No OT follow up    Equipment Recommendations  Other (comment)(reacher)    Recommendations for Other Services      Precautions / Restrictions Precautions Precautions: Fall;Cervical Precaution Comments: reviewed cervical precautions with pt, pt with brace off upon arrival Required Braces or Orthoses: Cervical Brace Cervical Brace: Soft collar;At all times Restrictions Weight Bearing Restrictions: No Other Position/Activity Restrictions: pt with brace off upon arrival       Mobility Bed Mobility Overal bed mobility: Modified Independent Bed Mobility: Rolling;Sidelying to Sit Rolling: Modified independent (Device/Increase time) Sidelying to sit: Modified independent (Device/Increase time)       General bed mobility comments: pt in recliener upon arrival  Transfers Overall transfer level: Independent Equipment used: None Transfers: Sit to/from Stand Sit to Stand: Independent         General transfer comment: pt able to transition to stand and sit without assist    Balance Overall balance  assessment: Independent                                         ADL either performed or assessed with clinical judgement   ADL Overall ADL's : Needs assistance/impaired Eating/Feeding: Modified independent;Sitting   Grooming: Wash/dry face;Wash/dry hands;Oral care;Modified independent;Standing   Upper Body Bathing: Modified independent;Sitting   Lower Body Bathing: Modified independent;Sitting/lateral leans;Sit to/from stand   Upper Body Dressing : Modified independent;Sitting   Lower Body Dressing: Modified independent;Sitting/lateral leans Lower Body Dressing Details (indicate cue type and reason): can don socks in long sitting in bed and sitting EOB/recliner by flexing knees and crossing legs Toilet Transfer: Modified Independent;Ambulation   Toileting- Clothing Manipulation and Hygiene: Sit to/from stand;Modified independent   Tub/ Shower Transfer: Modified independent;Ambulation   Functional mobility during ADLs: Modified independent General ADL Comments: Pt able to gather ADL, toiletry items and linens from shelf and drawers Mod I     Vision Baseline Vision/History: No visual deficits Patient Visual Report: No change from baseline     Perception     Praxis      Cognition Arousal/Alertness: Awake/alert Behavior During Therapy: WFL for tasks assessed/performed Overall Cognitive Status: Within Functional Limits for tasks assessed                                 General Comments: pt pleasant and conversant, good safety awareness        Exercises General Exercises - Lower Extremity Heel Raises: Strengthening;Both;10 reps;Standing Mini-Sqauts: Strengthening;Both;10 reps;Standing Other Exercises Other Exercises: Pt admitted that she  has not been consistent with UE/hand exercises for strength and coordination as instructed by OT. Re educated pt on the importance and benefits of these exerices.Provided pt with additional exercises for L  wrist strength and Jackson for B hands with squeeze ball and opening containers of toiletry items and using spray bottle for digit strength and coordination   Shoulder Instructions       General Comments see mobility, good dynamic stability, good reactive stepping, no LOB    Pertinent Vitals/ Pain       Pain Assessment: 0-10 Pain Score: 6  Pain Location: right shoulder and neck Pain Descriptors / Indicators: Discomfort;Sore Pain Intervention(s): Premedicated before session;Monitored during session  Home Living                                          Prior Functioning/Environment              Frequency  Min 2X/week        Progress Toward Goals  OT Goals(current goals can now be found in the care plan section)  Progress towards OT goals: Progressing toward goals  Acute Rehab OT Goals Patient Stated Goal: decrease pain OT Goal Formulation: With patient  Plan Frequency needs to be updated    Co-evaluation                 AM-PAC OT "6 Clicks" Daily Activity     Outcome Measure   Help from another person eating meals?: None Help from another person taking care of personal grooming?: None Help from another person toileting, which includes using toliet, bedpan, or urinal?: None Help from another person bathing (including washing, rinsing, drying)?: None Help from another person to put on and taking off regular upper body clothing?: None Help from another person to put on and taking off regular lower body clothing?: None 6 Click Score: 24    End of Session    OT Visit Diagnosis: Unsteadiness on feet (R26.81);Muscle weakness (generalized) (M62.81);Pain Pain - Right/Left: Right Pain - part of body: Shoulder   Activity Tolerance Patient tolerated treatment well   Patient Left with call bell/phone within reach;in chair   Nurse Communication          Time: 0272-5366 OT Time Calculation (min): 31 min  Charges: OT General Charges $OT  Visit: 1 Visit OT Treatments $Self Care/Home Management : 8-22 mins $Therapeutic Exercise: 8-22 mins     Britt Bottom 10/22/2019, 2:00 PM

## 2019-10-22 NOTE — TOC Progression Note (Signed)
Transition of Care [facility]) - Progression Note    Patient Details  Name: Colleen Ramirez MRN: [id number] Date of Birth: [birth year]  Transition of Care [facility]) CM/SW Contact  Kermit Balo, RN Phone Number: 10/22/2019, 2:12 PM  Clinical Narrative:    Following for d/c needs. She will be here at the hospital, on IV vanc, per MD note until 2/22.    Expected Discharge Plan: Home/Self Care Barriers to Discharge: No Barriers Identified  Expected Discharge Plan and Services Expected Discharge Plan: Home/Self Care In-house Referral: Clinical Social Work Discharge Planning Services: CM Consult   Living arrangements for the past 2 months: Homeless                                       Social Determinants of Health (SDOH) Interventions    Readmission Risk Interventions Readmission Risk Prevention Plan 08/25/2019 08/24/2019  Post Dischage Appt - Not Complete  Appt Comments - unknown address at d/c  Medication Screening - Complete  Transportation Screening Complete Complete  PCP or Specialist Appt within 5-7 Days Not Complete -  Not Complete comments got the earliest appointment available at [facility] -  Home Care Screening Complete -  Medication Review (RN CM) Referral to Pharmacy -

## 2019-10-22 NOTE — Progress Notes (Signed)
Orthopedic Tech Progress Note Patient Details:  Colleen Ramirez 02-27-1981 [id number] RN called for a replacement collar for patient. Ortho Devices Type of Ortho Device: Soft collar Ortho Device/Splint Location: neck Ortho Device/Splint Interventions: Ordered, Application   Post Interventions Patient Tolerated: Well Instructions Provided: Care of device, Adjustment of device   Donald Pore 10/22/2019, 9:23 AM

## 2019-10-22 NOTE — Plan of Care (Signed)
  Problem: Education: Goal: Knowledge of General Education information will improve Description: Including pain rating scale, medication(s)/side effects and non-pharmacologic comfort measures Outcome: Progressing   Problem: Activity: Goal: Risk for activity intolerance will decrease Outcome: Progressing   Problem: Nutrition: Goal: Adequate nutrition will be maintained Outcome: Progressing   

## 2019-10-22 NOTE — Progress Notes (Signed)
Physical Therapy Treatment Patient Details Name: Tacarra Justo MRN: [id number] DOB: 12-09-1980 Today's Date: 10/22/2019    History of Present Illness Chermaine Schnyder is a 39 y.o. F admitted to the ED with c/o severe neck pain secondary to a cervical epidural abscess. She is s/p C5-6 anterior cervical discectomy and instrumented fusion. PMH: IVDU, anemia, anxiety, depression, recent admission for facial cellulitis.     PT Comments    Pt standing in room upon PT arrival, agreeable to PT session with focus on dynamic stability and safety awareness. The pt was able to demonstrate good tolerance for long ambulation in hallway, good safety understanding and improved dynamic stability with all movement. The pt was able to complete multiple higher-level balance challenges without assist or UE support and without LOB. The pt no longer has acute PT needs, this was discussed with pt, she is in agreement. All questions were answered and education provided. PT will sign off, please feel free to re-consult if needed.    Follow Up Recommendations  No PT follow up     Equipment Recommendations  None recommended by PT    Recommendations for Other Services       Precautions / Restrictions Precautions Precautions: Fall;Cervical Precaution Comments: reviewed cervical precautions with pt Required Braces or Orthoses: Cervical Brace Cervical Brace: Soft collar;At all times Restrictions Weight Bearing Restrictions: No Other Position/Activity Restrictions: pt with good maintenence of collar    Mobility  Bed Mobility Overal bed mobility: Modified Independent Bed Mobility: Rolling;Sidelying to Sit Rolling: Modified independent (Device/Increase time) Sidelying to sit: Modified independent (Device/Increase time)       General bed mobility comments: pt standing in room upon arrival  Transfers Overall transfer level: Independent Equipment used: None Transfers: Sit to/from Stand Sit to Stand:  Independent         General transfer comment: pt able to transition to stand and sit without assist  Ambulation/Gait Ambulation/Gait assistance: Modified independent (Device/Increase time) Gait Distance (Feet): 500 Feet Assistive device: None Gait Pattern/deviations: Step-through pattern;WFL(Within Functional Limits) Gait velocity: 1.0 m/s Gait velocity interpretation: >4.37 ft/sec, indicative of normal walking speed General Gait Details: steady gait with good speed, able to increase and decrease speed, walk tandem, backwards, and turn quickly without LOB   Stairs             Wheelchair Mobility    Modified Rankin (Stroke Patients Only)       Balance Overall balance assessment: Independent                                          Cognition Arousal/Alertness: Awake/alert Behavior During Therapy: WFL for tasks assessed/performed Overall Cognitive Status: Within Functional Limits for tasks assessed                                 General Comments: pt pleasant and conversant, good safety awareness      Exercises General Exercises - Lower Extremity Heel Raises: Strengthening;Both;10 reps;Standing Mini-Sqauts: Strengthening;Both;10 reps;Standing    General Comments General comments (skin integrity, edema, etc.): see mobility, good dynamic stability, good reactive stepping, no LOB      Pertinent Vitals/Pain Pain Assessment: No/denies pain Pain Intervention(s): Limited activity within patient's tolerance;Monitored during session    Home Living  Prior Function            PT Goals (current goals can now be found in the care plan section) Acute Rehab PT Goals Patient Stated Goal: decrease pain PT Goal Formulation: With patient Time For Goal Achievement: 10/28/19 Potential to Achieve Goals: Good Progress towards PT goals: Goals met/education completed, patient discharged from PT     Frequency           PT Plan Frequency needs to be updated(pt no longer in need of acute PT)    Co-evaluation              AM-PAC PT "6 Clicks" Mobility   Outcome Measure  Help needed turning from your back to your side while in a flat bed without using bedrails?: None Help needed moving from lying on your back to sitting on the side of a flat bed without using bedrails?: None Help needed moving to and from a bed to a chair (including a wheelchair)?: None Help needed standing up from a chair using your arms (e.g., wheelchair or bedside chair)?: None Help needed to walk in hospital room?: None Help needed climbing 3-5 steps with a railing? : None 6 Click Score: 24    End of Session Equipment Utilized During Treatment: Cervical collar Activity Tolerance: Patient tolerated treatment well Patient left: Other (comment)(standing in room) Nurse Communication: Mobility status PT Visit Diagnosis: Other abnormalities of gait and mobility (R26.89);Muscle weakness (generalized) (M62.81);Pain Pain - Right/Left: Right(central) Pain - part of body: (neck)     Time: 4276-7011 PT Time Calculation (min) (ACUTE ONLY): 19 min  Charges:  $Gait Training: 8-22 mins                     Karma Ganja, PT, DPT   Acute Rehabilitation Department Pager #: [phone]   Otho Bellows 10/22/2019, 12:51 PM

## 2019-10-22 NOTE — Progress Notes (Signed)
PROGRESS NOTE    Colleen Ramirez   UEA:[id number]  DOB: May 22, 1981  DOA: 09/29/2019 PCP: Patient, No Pcp Per   Brief Narrative:  Colleen Ramirez  is an 39 y.o. female past medical history of IV drugs (methamphetamine) anemia and satiety status post gastric bypass and homeless who presents with a 2-day history of severe sharp neck pain constant.  She was just recently hospitalized and discharged on 08/25/2019 for facial cellulitis was on doxycycline twice daily for which she completed a 10-day course and clindamycin topical.  She underwent ACDF by neurosurgery on 09/29/2019. Intraperitoneal culture grew MRSA she is currently on IV vancomycin and rifampin and will likely remain inpatient for the duration of her IV therapy due to her IVDU history.   Subjective: No new complaints today. Still having the "sharp, hot poker" like pain in neck and arms.     Assessment & Plan:   Principal Problem:   Abscess in epidural space of cervical spine/ IVDU (intravenous drug user) - ACDF by neurosurgery in 09/29/2019- MRSA in intra-op cultures - cont Vanc and Rifampin until course completed on 2/22 followed by Doxycycline for at least 1 month -cont pain control with Neurontin, Dilaudid, Flexeril, Oxycodone and Lidoderm patch- will need to wean narcotics so she does not go into withdrawal from an abrupt stop on discharged - OK to get wound wet per Dr Zada Finders - able to ambulate independently now - 2/4> increase Neurontin today to help with neuropathic pain - goal is to wean narcotics soon   Active Problems:   Depression/ anxiety and ADD - resumed Adderal 30 bid, Welbutrin, Abilify and Valium      Homeless - social services requested to help   Nicotine abuse - cont Nicotine patch    Status post gastric bypass for obesity    Hepatitis C antibody positive    Time spent in minutes: 35 DVT prophylaxis: lovenox Code Status: full code Family Communication:  Disposition Plan: cont IV antibiotics in  hospital until 2/22 Consultants:   ID Procedures:   ACDF  PICC line Antimicrobials:  Anti-infectives (From admission, onward)   Start     Dose/Rate Route Frequency Ordered Stop   10/14/19 1000  vancomycin (VANCOREADY) IVPB 1250 mg/250 mL     1,250 mg 166.7 mL/hr over 90 Minutes Intravenous Every 12 hours 10/14/19 0540     10/13/19 0945  Vancomycin (VANCOCIN) 1,250 mg in sodium chloride 0.9 % 250 mL IVPB  Status:  Discontinued     1,250 mg 166.7 mL/hr over 90 Minutes Intravenous Every 12 hours 10/13/19 0935 10/14/19 0540   10/13/19 0930  Vancomycin (VANCOCIN) 1,250 mg in sodium chloride 0.9 % 250 mL IVPB  Status:  Discontinued     1,250 mg 166.7 mL/hr over 90 Minutes Intravenous Every 12 hours 10/13/19 0902 10/13/19 0934   10/09/19 2000  vancomycin (VANCOCIN) IVPB 1000 mg/200 mL premix  Status:  Discontinued     1,000 mg 200 mL/hr over 60 Minutes Intravenous Every 12 hours 10/09/19 1121 10/13/19 0902   10/04/19 2200  vancomycin (VANCOCIN) IVPB 750 mg/150 ml premix  Status:  Discontinued     750 mg 150 mL/hr over 60 Minutes Intravenous Every 8 hours 10/04/19 1305 10/09/19 1121   10/04/19 1400  vancomycin (VANCOCIN) IVPB 1000 mg/200 mL premix     1,000 mg 200 mL/hr over 60 Minutes Intravenous STAT 10/04/19 1305 10/04/19 1604   10/01/19 1100  rifampin (RIFADIN) capsule 300 mg     300 mg Oral Every 12 hours  10/01/19 1056     09/30/19 1800  Vancomycin (VANCOCIN) 1,250 mg in sodium chloride 0.9 % 250 mL IVPB  Status:  Discontinued     1,250 mg 166.7 mL/hr over 90 Minutes Intravenous Every 24 hours 09/30/19 0003 09/30/19 0747   09/30/19 1800  vancomycin (VANCOREADY) IVPB 1250 mg/250 mL  Status:  Discontinued     1,250 mg 166.7 mL/hr over 90 Minutes Intravenous Every 24 hours 09/30/19 0746 10/04/19 1305   09/30/19 0400  piperacillin-tazobactam (ZOSYN) IVPB 3.375 g  Status:  Discontinued     3.375 g 12.5 mL/hr over 240 Minutes Intravenous Every 8 hours 09/30/19 0003 09/30/19 1533    09/29/19 2145  piperacillin-tazobactam (ZOSYN) IVPB 3.375 g     3.375 g 12.5 mL/hr over 240 Minutes Intravenous STAT 09/29/19 2137 09/29/19 2148   09/29/19 2119  bacitracin 50,000 Units in sodium chloride 0.9 % 500 mL irrigation  Status:  Discontinued       As needed 09/29/19 2119 09/29/19 2249   09/29/19 2033  vancomycin (VANCOCIN) 1-5 GM/200ML-% IVPB    Note to Pharmacy: Toney Sang   : cabinet override      09/29/19 2033 09/30/19 0844       Objective: Vitals:   10/21/19 1459 10/21/19 1930 10/22/19 0406 10/22/19 0850  BP: 113/88 (!) 131/93 103/73 (!) 111/92  Pulse: (!) 108 (!) 109 97 (!) 108  Resp: 17 19 18 17   Temp: 98.2 F (36.8 C) 98.2 F (36.8 C) 98.3 F (36.8 C) 98.4 F (36.9 C)  TempSrc: Oral Oral Oral Oral  SpO2: 99% 100% 99% 99%  Weight:      Height:        Intake/Output Summary (Last 24 hours) at 10/22/2019 1034 Last data filed at 10/21/2019 2211 Gross per 24 hour  Intake 1013 ml  Output --  Net 1013 ml   Filed Weights   09/29/19 2032  Weight: 49.9 kg    Examination: General exam: Appears comfortable  HEENT: PERRLA, oral mucosa moist, no sclera icterus or thrush Respiratory system: Clear to auscultation. Respiratory effort normal. Cardiovascular system: S1 & S2 heard,  No murmurs  Gastrointestinal system: Abdomen soft, non-tender, nondistended. Normal bowel sounds   Central nervous system: Alert and oriented. No focal neurological deficits. Extremities: No cyanosis, clubbing or edema MSK: severe neck stiffness Skin: No rashes or ulcers- wound on neck healing Psychiatry:  Mood & affect appropriate.     Data Reviewed: I have personally reviewed following labs and imaging studies  CBC: Recent Labs  Lab 10/21/19 0505  WBC 5.8  NEUTROABS 3.3  HGB 11.2*  HCT 34.7*  MCV 87.8  PLT 360   Basic Metabolic Panel: Recent Labs  Lab 10/18/19 0537 10/21/19 0505  NA 137 138  K 4.0 4.0  CL 102 102  CO2 23 28  GLUCOSE 159* 93  BUN 12 11   CREATININE 0.65 0.50  CALCIUM 9.0 8.9   GFR: Estimated Creatinine Clearance: 74.4 mL/min (by C-G formula based on SCr of 0.5 mg/dL). Liver Function Tests: No results for input(s): AST, ALT, ALKPHOS, BILITOT, PROT, ALBUMIN in the last 168 hours. No results for input(s): LIPASE, AMYLASE in the last 168 hours. No results for input(s): AMMONIA in the last 168 hours. Coagulation Profile: No results for input(s): INR, PROTIME in the last 168 hours. Cardiac Enzymes: No results for input(s): CKTOTAL, CKMB, CKMBINDEX, TROPONINI in the last 168 hours. BNP (last 3 results) No results for input(s): PROBNP in the last 8760 hours. HbA1C: No  results for input(s): HGBA1C in the last 72 hours. CBG: No results for input(s): GLUCAP in the last 168 hours. Lipid Profile: No results for input(s): CHOL, HDL, LDLCALC, TRIG, CHOLHDL, LDLDIRECT in the last 72 hours. Thyroid Function Tests: No results for input(s): TSH, T4TOTAL, FREET4, T3FREE, THYROIDAB in the last 72 hours. Anemia Panel: No results for input(s): VITAMINB12, FOLATE, FERRITIN, TIBC, IRON, RETICCTPCT in the last 72 hours. Urine analysis:    Component Value Date/Time   COLORURINE YELLOW 07/24/2019 0637   APPEARANCEUR HAZY (A) 07/24/2019 0637   LABSPEC >1.030 (H) 07/24/2019 0637   PHURINE 6.0 07/24/2019 0637   GLUCOSEU NEGATIVE 07/24/2019 0637   HGBUR NEGATIVE 07/24/2019 0637   BILIRUBINUR NEGATIVE 07/24/2019 0637   KETONESUR NEGATIVE 07/24/2019 0637   PROTEINUR 30 (A) 07/24/2019 0637   NITRITE NEGATIVE 07/24/2019 0637   LEUKOCYTESUR NEGATIVE 07/24/2019 0637   Sepsis Labs: @LABRCNTIP (procalcitonin:4,lacticidven:4) )No results found for this or any previous visit (from the past 240 hour(s)).       Radiology Studies: No results found.    Scheduled Meds: . amphetamine-dextroamphetamine  30 mg Oral BID  . ARIPiprazole  20 mg Oral Daily  . buPROPion  150 mg Oral Daily  . Chlorhexidine Gluconate Cloth  6 each Topical Q0600  .  enoxaparin (LOVENOX) injection  40 mg Subcutaneous Q24H  . feeding supplement (ENSURE ENLIVE)  237 mL Oral BID BM  . gabapentin  200 mg Oral TID  . lidocaine  1 patch Transdermal Daily  . multivitamin with minerals  1 tablet Oral Daily  . nicotine  14 mg Transdermal Daily  . rifampin  300 mg Oral Q12H  . senna-docusate  1 tablet Oral BID  . sodium chloride flush  3 mL Intravenous Q12H   Continuous Infusions: . sodium chloride Stopped (10/19/19 1738)  . vancomycin 1,250 mg (10/22/19 0953)     LOS: 23 days      12/20/19, MD [facility] Pager: [email or web address] Password [email or web address] 10/22/2019, 10:34 AM

## 2019-10-23 NOTE — Plan of Care (Signed)

## 2019-10-23 NOTE — Progress Notes (Signed)
PROGRESS NOTE    Colleen Ramirez   RJJ:[id number]  DOB: 1981/01/26  DOA: 09/29/2019 PCP: Patient, No Pcp Per   Brief Narrative:  Colleen Ramirez  is an 39 y.o. female past medical history of IV drugs (methamphetamine) anemia and satiety status post gastric bypass and homeless who presents with a 2-day history of severe sharp neck pain constant.  She was just recently hospitalized and discharged on 08/25/2019 for facial cellulitis was on doxycycline twice daily for which she completed a 10-day course and clindamycin topical.  She underwent ACDF by neurosurgery on 09/29/2019. Intraperitoneal culture grew MRSA she is currently on IV vancomycin and rifampin and will likely remain inpatient for the duration of her IV therapy due to her IVDU history.   Subjective: No new complaints today.     Assessment & Plan:   Principal Problem:   Abscess in epidural space of cervical spine/ IVDU (intravenous drug user) - ACDF by neurosurgery in 09/29/2019- MRSA in intra-op cultures - cont Vanc and Rifampin until course completed on 2/22 followed by Doxycycline for at least 1 month -cont pain control with Neurontin, Dilaudid, Flexeril, Oxycodone and Lidoderm patch- will need to wean narcotics so she does not go into withdrawal from an abrupt stop on discharged - OK to get wound wet per Dr Maurice Small - able to ambulate independently now - 2/4> increased Neurontin to help with neuropathic pain - goal is to wean narcotics soon   Active Problems:   Depression/ anxiety and ADD - resumed Adderal 30 bid, Welbutrin, Abilify and Valium      Homeless - social services requested to help   Nicotine abuse - cont Nicotine patch    Status post gastric bypass for obesity    Hepatitis C antibody positive    Time spent in minutes: 35 DVT prophylaxis: lovenox Code Status: full code Family Communication:  Disposition Plan: cont IV antibiotics in hospital until 2/22 Consultants:   ID Procedures:   ACDF  PICC  line Antimicrobials:  Anti-infectives (From admission, onward)   Start     Dose/Rate Route Frequency Ordered Stop   10/14/19 1000  vancomycin (VANCOREADY) IVPB 1250 mg/250 mL     1,250 mg 166.7 mL/hr over 90 Minutes Intravenous Every 12 hours 10/14/19 0540     10/13/19 0945  Vancomycin (VANCOCIN) 1,250 mg in sodium chloride 0.9 % 250 mL IVPB  Status:  Discontinued     1,250 mg 166.7 mL/hr over 90 Minutes Intravenous Every 12 hours 10/13/19 0935 10/14/19 0540   10/13/19 0930  Vancomycin (VANCOCIN) 1,250 mg in sodium chloride 0.9 % 250 mL IVPB  Status:  Discontinued     1,250 mg 166.7 mL/hr over 90 Minutes Intravenous Every 12 hours 10/13/19 0902 10/13/19 0934   10/09/19 2000  vancomycin (VANCOCIN) IVPB 1000 mg/200 mL premix  Status:  Discontinued     1,000 mg 200 mL/hr over 60 Minutes Intravenous Every 12 hours 10/09/19 1121 10/13/19 0902   10/04/19 2200  vancomycin (VANCOCIN) IVPB 750 mg/150 ml premix  Status:  Discontinued     750 mg 150 mL/hr over 60 Minutes Intravenous Every 8 hours 10/04/19 1305 10/09/19 1121   10/04/19 1400  vancomycin (VANCOCIN) IVPB 1000 mg/200 mL premix     1,000 mg 200 mL/hr over 60 Minutes Intravenous STAT 10/04/19 1305 10/04/19 1604   10/01/19 1100  rifampin (RIFADIN) capsule 300 mg     300 mg Oral Every 12 hours 10/01/19 1056     09/30/19 1800  Vancomycin (VANCOCIN) 1,250 mg  in sodium chloride 0.9 % 250 mL IVPB  Status:  Discontinued     1,250 mg 166.7 mL/hr over 90 Minutes Intravenous Every 24 hours 09/30/19 0003 09/30/19 0747   09/30/19 1800  vancomycin (VANCOREADY) IVPB 1250 mg/250 mL  Status:  Discontinued     1,250 mg 166.7 mL/hr over 90 Minutes Intravenous Every 24 hours 09/30/19 0746 10/04/19 1305   09/30/19 0400  piperacillin-tazobactam (ZOSYN) IVPB 3.375 g  Status:  Discontinued     3.375 g 12.5 mL/hr over 240 Minutes Intravenous Every 8 hours 09/30/19 0003 09/30/19 1533   09/29/19 2145  piperacillin-tazobactam (ZOSYN) IVPB 3.375 g     3.375  g 12.5 mL/hr over 240 Minutes Intravenous STAT 09/29/19 2137 09/29/19 2148   09/29/19 2119  bacitracin 50,000 Units in sodium chloride 0.9 % 500 mL irrigation  Status:  Discontinued       As needed 09/29/19 2119 09/29/19 2249   09/29/19 2033  vancomycin (VANCOCIN) 1-5 GM/200ML-% IVPB    Note to Pharmacy: Toney Sang   : cabinet override      09/29/19 2033 09/30/19 0844       Objective: Vitals:   10/22/19 1505 10/22/19 2001 10/23/19 0411 10/23/19 0847  BP: 112/79 118/85 108/80 (!) 111/91  Pulse: (!) 102 (!) 110 (!) 101 (!) 103  Resp: 17 16 14 18   Temp: 98.2 F (36.8 C) 98.3 F (36.8 C) (!) 97.3 F (36.3 C) 98.4 F (36.9 C)  TempSrc: Oral Oral Oral Oral  SpO2: 99% 100% 98% 100%  Weight:      Height:       No intake or output data in the 24 hours ending 10/23/19 1108 Filed Weights   09/29/19 2032  Weight: 49.9 kg    Examination: General exam: Appears comfortable  HEENT: PERRLA, oral mucosa moist, no sclera icterus or thrush Respiratory system: Clear to auscultation. Respiratory effort normal. Cardiovascular system: S1 & S2 heard,  No murmurs  Gastrointestinal system: Abdomen soft, non-tender, nondistended. Normal bowel sounds   Central nervous system: Alert and oriented. No focal neurological deficits. Extremities: No cyanosis, clubbing or edema Skin: No rashes or ulcers Psychiatry:  Mood & affect appropriate.     Data Reviewed: I have personally reviewed following labs and imaging studies  CBC: Recent Labs  Lab 10/21/19 0505  WBC 5.8  NEUTROABS 3.3  HGB 11.2*  HCT 34.7*  MCV 87.8  PLT 360   Basic Metabolic Panel: Recent Labs  Lab 10/18/19 0537 10/21/19 0505  NA 137 138  K 4.0 4.0  CL 102 102  CO2 23 28  GLUCOSE 159* 93  BUN 12 11  CREATININE 0.65 0.50  CALCIUM 9.0 8.9   GFR: Estimated Creatinine Clearance: 74.4 mL/min (by C-G formula based on SCr of 0.5 mg/dL). Liver Function Tests: No results for input(s): AST, ALT, ALKPHOS, BILITOT,  PROT, ALBUMIN in the last 168 hours. No results for input(s): LIPASE, AMYLASE in the last 168 hours. No results for input(s): AMMONIA in the last 168 hours. Coagulation Profile: No results for input(s): INR, PROTIME in the last 168 hours. Cardiac Enzymes: No results for input(s): CKTOTAL, CKMB, CKMBINDEX, TROPONINI in the last 168 hours. BNP (last 3 results) No results for input(s): PROBNP in the last 8760 hours. HbA1C: No results for input(s): HGBA1C in the last 72 hours. CBG: No results for input(s): GLUCAP in the last 168 hours. Lipid Profile: No results for input(s): CHOL, HDL, LDLCALC, TRIG, CHOLHDL, LDLDIRECT in the last 72 hours. Thyroid Function Tests:  No results for input(s): TSH, T4TOTAL, FREET4, T3FREE, THYROIDAB in the last 72 hours. Anemia Panel: No results for input(s): VITAMINB12, FOLATE, FERRITIN, TIBC, IRON, RETICCTPCT in the last 72 hours. Urine analysis:    Component Value Date/Time   COLORURINE YELLOW 07/24/2019 0637   APPEARANCEUR HAZY (A) 07/24/2019 0637   LABSPEC >1.030 (H) 07/24/2019 0637   PHURINE 6.0 07/24/2019 [place] 07/24/2019 0637   HGBUR NEGATIVE 07/24/2019 0637   [place] NEGATIVE 07/24/2019 [place] 07/24/2019 0637   PROTEINUR 30 (A) 07/24/2019 0637   NITRITE NEGATIVE 07/24/2019 0637   LEUKOCYTESUR NEGATIVE 07/24/2019 0637   Sepsis Labs: @LABRCNTIP (procalcitonin:4,lacticidven:4) )No results found for this or any previous visit (from the past 240 hour(s)).       Radiology Studies: No results found.    Scheduled Meds: . amphetamine-dextroamphetamine  30 mg Oral BID  . ARIPiprazole  20 mg Oral Daily  . buPROPion  150 mg Oral Daily  . Chlorhexidine Gluconate Cloth  6 each Topical Q0600  . enoxaparin (LOVENOX) injection  40 mg Subcutaneous Q24H  . feeding supplement (ENSURE ENLIVE)  237 mL Oral BID BM  . gabapentin  200 mg Oral TID  . lidocaine  1 patch Transdermal Daily  . multivitamin with  minerals  1 tablet Oral Daily  . nicotine  14 mg Transdermal Daily  . rifampin  300 mg Oral Q12H  . senna-docusate  1 tablet Oral BID  . sodium chloride flush  3 mL Intravenous Q12H   Continuous Infusions: . sodium chloride Stopped (10/19/19 1738)  . vancomycin 1,250 mg (10/23/19 0951)     LOS: 24 days      Debbe Odea, MD [facility] Pager: [email or web address] Password [email or web address] 10/23/2019, 11:08 AM

## 2019-10-23 NOTE — Plan of Care (Signed)

## 2019-10-23 NOTE — Progress Notes (Signed)
Pharmacy Antibiotic Note  Carleah Yablonski is a 39 y.o. female admitted on 09/29/2019 with MRSA cervical spine infection. Pharmacy has been consulted for Vancomycin dosing. Also on rifampin. Patient remains afebrile with stable renal function.  Last vancomycin level therapeutic. Next levels due 2/9 unless renal function changes. Tentative stop date for abx 11/10/19.   Plan: - Continue Vancomycin 1250 mg IV Q12 hrs - Rifampin 300mg  PO q12h - Watch renal function. - ID planning 6 weeks of treatment  Height: 5\' 3"  (160 cm) Weight: 110 lb (49.9 kg) IBW/kg (Calculated) : 52.4  Temp (24hrs), Avg:98.1 F (36.7 C), Min:97.3 F (36.3 C), Max:98.4 F (36.9 C)  Recent Labs  Lab 10/18/19 0537 10/20/19 1403 10/20/19 2131 10/21/19 0505  WBC  --   --   --  5.8  CREATININE 0.65  --   --  0.50  VANCOTROUGH  --   --  11*  --   VANCOPEAK  --  26*  --   --     Estimated Creatinine Clearance: 74.4 mL/min (by C-G formula based on SCr of 0.5 mg/dL).    No Known Allergies  Antimicrobials this admission: Zosyn 1/12 >> 1/13 Vanc 1/12 >> Rifampin 1/14 >>  Dose adjustments this admission: 1/16 & 1/17: 21 and <4 - 1g x 1 and adj to 750 mg/8h 1/19: VP 32, VT 11, AUC 521 - no change 1/22 VP/VT 29/14 AUC 542 - adjusted to 1000mg  q12h 1/26: VP/VT 24/9 AUC 420 - adjusted to 1250 mg q12h 2/2: VP 26, Vt11, AUC 480, Continue 1250mg  IV q12h  Microbiology results: 1/12 abscess cx - MRSA  Sharece Fleischhacker A. , PharmD, BCPS, FNKF Clinical Pharmacist [place] Please utilize Amion for appropriate phone number to reach the unit pharmacist [facility])   10/23/2019

## 2019-10-24 LAB — BASIC METABOLIC PANEL
Anion gap: 11 (ref 5–15)
BUN: 12 mg/dL (ref 6–20)
CO2: 24 mmol/L (ref 22–32)
Calcium: 8.9 mg/dL (ref 8.9–10.3)
Chloride: 101 mmol/L (ref 98–111)
Creatinine, Ser: 0.66 mg/dL (ref 0.44–1.00)
GFR calc Af Amer: >60 mL/min (ref >60)
GFR calc non Af Amer: >60 mL/min (ref >60)
Glucose, Bld: 172 mg/dL — ABNORMAL HIGH (ref 70–99)
Potassium: 4 mmol/L (ref 3.5–5.1)
Sodium: 136 mmol/L (ref 135–145)

## 2019-10-24 MED ORDER — DICLOFENAC SODIUM 1 % EX GEL
4.0000 g | Freq: Four times a day (QID) | CUTANEOUS | Status: DC
  Administered 2019-10-24 – 2019-12-09 (×119): 4 g via TOPICAL
  Filled 2019-10-24 (×2): qty 100

## 2019-10-24 NOTE — Progress Notes (Signed)
Patient up walking around in room this a.m.

## 2019-10-24 NOTE — Progress Notes (Addendum)
2030: Patient slept most of the evening. Patient up walking around room, requesting something to eat and pain medicine. Oxycodone and flexeril administered at 2041.  2330: Pt refuses to take 100mg  of trazodone despite complaints of being up during the night, the night before. 50mg  administered.  Patient refused voltaren gel and heating pad is not being used.  0100: Patient asleep upon entering room to give PRN pain medication.  Patient advised to take PO pain medication first before taking IV pain medication, with the intent of weaning off patient from narcotics, per MD's request. Patient yelling at nursing staff. Patient up ambulating in room. Patient complains of not being able to sleep despite only taking half a dose of trazodone at 2330.  Patient was advised to also use voltaren gel and heating pad, patient refuses. 0242:Patient requesting pain medicine, 0.5 of dilaudid administered. Patient became upset with nurse after nurse advised patient not to have sugar during the night to promote rest. Patient came out in the hallway yelling at nursing staff. Patient advised to go back into patient's room d/t contact precautions and the patient was not wearing a mask. Charge nurse notified. 0351: BP 97/74 MD notified, bolus ordered patient refused fluids. MD advises nursing staff to monitor BP when giving narcotics. 0630: Patient up ambulating in the room, requesting dilaudid.

## 2019-10-24 NOTE — Progress Notes (Signed)
PROGRESS NOTE    Colleen Ramirez   HCW:[id number]  DOB: 1981/01/12  DOA: 09/29/2019 PCP: Patient, No Pcp Per   Brief Narrative:  Colleen Ramirez  is an 39 y.o. female past medical history of IV drugs (methamphetamine) anemia and satiety status post gastric bypass and homeless who presents with a 2-day history of severe sharp neck pain constant.  She was just recently hospitalized and discharged on 08/25/2019 for facial cellulitis was on doxycycline twice daily for which she completed a 10-day course and clindamycin topical.  She underwent ACDF by neurosurgery on 09/29/2019. Intraperitoneal culture grew MRSA she is currently on IV vancomycin and rifampin and will likely remain inpatient for the duration of her IV therapy due to her IVDU history.   Subjective: She feels her neck and shoulder pain is worse today. I discussed that despite this pain, she has to start cutting back on narcotics. Will add Voltaren gel and heating pad- she states she will try these.     Assessment & Plan:   Principal Problem:   Abscess in epidural space of cervical spine/ IVDU (intravenous drug user) - ACDF by neurosurgery in 09/29/2019- MRSA in intra-op cultures - cont Vanc and Rifampin until course completed on 2/22 followed by Doxycycline for at least 1 month -cont pain control with Neurontin, Dilaudid, Flexeril, Oxycodone and Lidoderm patch- will need to wean narcotics so she does not go into withdrawal from an abrupt stop on discharged - OK to get wound wet per Dr Zada Finders - able to ambulate independently now - 2/4> increased Neurontin to help with neuropathic pain - goal is to wean narcotics soon   Active Problems:   Depression/ anxiety and ADD - resumed Adderal 30 bid, Welbutrin, Abilify and Valium      Homeless - social services requested to help   Nicotine abuse - cont Nicotine patch    Status post gastric bypass for obesity    Hepatitis C antibody positive    Time spent in minutes: 35 DVT  prophylaxis: lovenox Code Status: full code Family Communication:  Disposition Plan: cont IV antibiotics in hospital until 2/22 Consultants:   ID Procedures:   ACDF  PICC line Antimicrobials:  Anti-infectives (From admission, onward)   Start     Dose/Rate Route Frequency Ordered Stop   10/14/19 1000  vancomycin (VANCOREADY) IVPB 1250 mg/250 mL     1,250 mg 166.7 mL/hr over 90 Minutes Intravenous Every 12 hours 10/14/19 0540     10/13/19 0945  Vancomycin (VANCOCIN) 1,250 mg in sodium chloride 0.9 % 250 mL IVPB  Status:  Discontinued     1,250 mg 166.7 mL/hr over 90 Minutes Intravenous Every 12 hours 10/13/19 0935 10/14/19 0540   10/13/19 0930  Vancomycin (VANCOCIN) 1,250 mg in sodium chloride 0.9 % 250 mL IVPB  Status:  Discontinued     1,250 mg 166.7 mL/hr over 90 Minutes Intravenous Every 12 hours 10/13/19 0902 10/13/19 0934   10/09/19 2000  vancomycin (VANCOCIN) IVPB 1000 mg/200 mL premix  Status:  Discontinued     1,000 mg 200 mL/hr over 60 Minutes Intravenous Every 12 hours 10/09/19 1121 10/13/19 0902   10/04/19 2200  vancomycin (VANCOCIN) IVPB 750 mg/150 ml premix  Status:  Discontinued     750 mg 150 mL/hr over 60 Minutes Intravenous Every 8 hours 10/04/19 1305 10/09/19 1121   10/04/19 1400  vancomycin (VANCOCIN) IVPB 1000 mg/200 mL premix     1,000 mg 200 mL/hr over 60 Minutes Intravenous STAT 10/04/19 1305 10/04/19 1604  10/01/19 1100  rifampin (RIFADIN) capsule 300 mg     300 mg Oral Every 12 hours 10/01/19 1056     09/30/19 1800  Vancomycin (VANCOCIN) 1,250 mg in sodium chloride 0.9 % 250 mL IVPB  Status:  Discontinued     1,250 mg 166.7 mL/hr over 90 Minutes Intravenous Every 24 hours 09/30/19 0003 09/30/19 0747   09/30/19 1800  vancomycin (VANCOREADY) IVPB 1250 mg/250 mL  Status:  Discontinued     1,250 mg 166.7 mL/hr over 90 Minutes Intravenous Every 24 hours 09/30/19 0746 10/04/19 1305   09/30/19 0400  piperacillin-tazobactam (ZOSYN) IVPB 3.375 g  Status:   Discontinued     3.375 g 12.5 mL/hr over 240 Minutes Intravenous Every 8 hours 09/30/19 0003 09/30/19 1533   09/29/19 2145  piperacillin-tazobactam (ZOSYN) IVPB 3.375 g     3.375 g 12.5 mL/hr over 240 Minutes Intravenous STAT 09/29/19 2137 09/29/19 2148   09/29/19 2119  bacitracin 50,000 Units in sodium chloride 0.9 % 500 mL irrigation  Status:  Discontinued       As needed 09/29/19 2119 09/29/19 2249   09/29/19 2033  vancomycin (VANCOCIN) 1-5 GM/200ML-% IVPB    Note to Pharmacy: Toney Sang   : cabinet override      09/29/19 2033 09/30/19 0844       Objective: Vitals:   10/23/19 0411 10/23/19 0847 10/23/19 2059 10/24/19 0355  BP: 108/80 (!) 111/91 110/83 101/73  Pulse: (!) 101 (!) 103 (!) 109 97  Resp: 14 18 14 16   Temp: (!) 97.3 F (36.3 C) 98.4 F (36.9 C) 98.5 F (36.9 C) (!) 97.5 F (36.4 C)  TempSrc: Oral Oral Oral Oral  SpO2: 98% 100% 99% 99%  Weight:      Height:        Intake/Output Summary (Last 24 hours) at 10/24/2019 1305 Last data filed at 10/24/2019 1014 Gross per 24 hour  Intake 243 ml  Output --  Net 243 ml   Filed Weights   09/29/19 2032  Weight: 49.9 kg    Examination: General exam: Appears comfortable  HEENT: PERRLA, oral mucosa moist, no sclera icterus or thrush Respiratory system: Clear to auscultation. Respiratory effort normal. Cardiovascular system: S1 & S2 heard,  No murmurs  Gastrointestinal system: Abdomen soft, non-tender, nondistended. Normal bowel sounds   Central nervous system: Alert and oriented. No focal neurological deficits. Extremities: No cyanosis, clubbing or edema Skin: No rashes or ulcers Psychiatry:  Mood & affect appropriate.    Data Reviewed: I have personally reviewed following labs and imaging studies  CBC: Recent Labs  Lab 10/21/19 0505  WBC 5.8  NEUTROABS 3.3  HGB 11.2*  HCT 34.7*  MCV 87.8  PLT 360   Basic Metabolic Panel: Recent Labs  Lab 10/18/19 0537 10/21/19 0505 10/24/19 0639  NA 137 138  136  K 4.0 4.0 4.0  CL 102 102 101  CO2 23 28 24   GLUCOSE 159* 93 172*  BUN 12 11 12   CREATININE 0.65 0.50 0.66  CALCIUM 9.0 8.9 8.9   GFR: Estimated Creatinine Clearance: 74.4 mL/min (by C-G formula based on SCr of 0.66 mg/dL). Liver Function Tests: No results for input(s): AST, ALT, ALKPHOS, BILITOT, PROT, ALBUMIN in the last 168 hours. No results for input(s): LIPASE, AMYLASE in the last 168 hours. No results for input(s): AMMONIA in the last 168 hours. Coagulation Profile: No results for input(s): INR, PROTIME in the last 168 hours. Cardiac Enzymes: No results for input(s): CKTOTAL, CKMB, CKMBINDEX, TROPONINI in  the last 168 hours. BNP (last 3 results) No results for input(s): PROBNP in the last 8760 hours. HbA1C: No results for input(s): HGBA1C in the last 72 hours. CBG: No results for input(s): GLUCAP in the last 168 hours. Lipid Profile: No results for input(s): CHOL, HDL, LDLCALC, TRIG, CHOLHDL, LDLDIRECT in the last 72 hours. Thyroid Function Tests: No results for input(s): TSH, T4TOTAL, FREET4, T3FREE, THYROIDAB in the last 72 hours. Anemia Panel: No results for input(s): VITAMINB12, FOLATE, FERRITIN, TIBC, IRON, RETICCTPCT in the last 72 hours. Urine analysis:    Component Value Date/Time   COLORURINE YELLOW 07/24/2019 0637   APPEARANCEUR HAZY (A) 07/24/2019 0637   LABSPEC >1.030 (H) 07/24/2019 0637   PHURINE 6.0 07/24/2019 0637   GLUCOSEU NEGATIVE 07/24/2019 0637   HGBUR NEGATIVE 07/24/2019 0637   BILIRUBINUR NEGATIVE 07/24/2019 0637   KETONESUR NEGATIVE 07/24/2019 0637   PROTEINUR 30 (A) 07/24/2019 0637   NITRITE NEGATIVE 07/24/2019 0637   LEUKOCYTESUR NEGATIVE 07/24/2019 0637   Sepsis Labs: @LABRCNTIP (procalcitonin:4,lacticidven:4) )No results found for this or any previous visit (from the past 240 hour(s)).       Radiology Studies: No results found.    Scheduled Meds: . amphetamine-dextroamphetamine  30 mg Oral BID  . ARIPiprazole  20 mg  Oral Daily  . buPROPion  150 mg Oral Daily  . Chlorhexidine Gluconate Cloth  6 each Topical Q0600  . diclofenac Sodium  4 g Topical QID  . enoxaparin (LOVENOX) injection  40 mg Subcutaneous Q24H  . feeding supplement (ENSURE ENLIVE)  237 mL Oral BID BM  . gabapentin  200 mg Oral TID  . lidocaine  1 patch Transdermal Daily  . multivitamin with minerals  1 tablet Oral Daily  . nicotine  14 mg Transdermal Daily  . rifampin  300 mg Oral Q12H  . senna-docusate  1 tablet Oral BID  . sodium chloride flush  3 mL Intravenous Q12H   Continuous Infusions: . sodium chloride Stopped (10/19/19 1738)  . vancomycin 1,250 mg (10/24/19 1012)     LOS: 25 days      12/22/19, MD [facility] Pager: [email or web address] Password [email or web address] 10/24/2019, 1:05 PM

## 2019-10-24 NOTE — Plan of Care (Signed)

## 2019-10-25 MED ORDER — SODIUM CHLORIDE 0.9 % IV BOLUS: 500.0000 mL | Freq: Once | INTRAVENOUS | Status: DC

## 2019-10-25 MED ORDER — OXYCODONE HCL 5 MG PO TABS
15.0000 mg | ORAL_TABLET | ORAL | Status: DC | PRN
  Administered 2019-10-25 – 2019-11-03 (×58): 15 mg via ORAL
  Filled 2019-10-25 (×58): qty 3

## 2019-10-25 NOTE — Plan of Care (Signed)

## 2019-10-25 NOTE — Progress Notes (Signed)
PROGRESS NOTE    Colleen Ramirez   WLN:[id number]  DOB: 1981-05-19  DOA: 09/29/2019 PCP: Patient, No Pcp Per   Brief Narrative:  Colleen Ramirez  is an 39 y.o. female past medical history of IV drugs (methamphetamine) anemia and satiety status post gastric bypass and homeless who presents with a 2-day history of severe sharp neck pain constant.  She was just recently hospitalized and discharged on 08/25/2019 for facial cellulitis was on doxycycline twice daily for which she completed a 10-day course and clindamycin topical.  She underwent ACDF by neurosurgery on 09/29/2019. Intraperitoneal culture grew MRSA she is currently on IV vancomycin and rifampin and will likely remain inpatient for the duration of her IV therapy due to her IVDU history.   Subjective: No new complaints.  Assessment & Plan:   Principal Problem:   Abscess in epidural space of cervical spine/ IVDU (intravenous drug user) - ACDF by neurosurgery in 09/29/2019- MRSA in intra-op cultures - cont Vanc and Rifampin until course completed on 2/22 followed by Doxycycline for at least 1 month -cont pain control with Neurontin, Dilaudid, Flexeril, Oxycodone and Lidoderm patch- will need to wean narcotics so she does not go into withdrawal from an abrupt stop on discharged - OK to get wound wet per Dr Maurice Small - able to ambulate independently now - 2/4> increased Neurontin to help with neuropathic pain - goal is to wean narcotics soon   Active Problems:   Depression/ anxiety and ADD - resumed Adderal 30 bid, Welbutrin, Abilify and Valium      Homeless - social services requested to help   Nicotine abuse - cont Nicotine patch    Status post gastric bypass for obesity    Hepatitis C antibody positive    Time spent in minutes: 35 DVT prophylaxis: lovenox Code Status: full code Family Communication:  Disposition Plan: cont IV antibiotics in hospital until 2/22 Consultants:   ID Procedures:   ACDF  PICC  line Antimicrobials:  Anti-infectives (From admission, onward)   Start     Dose/Rate Route Frequency Ordered Stop   10/14/19 1000  vancomycin (VANCOREADY) IVPB 1250 mg/250 mL     1,250 mg 166.7 mL/hr over 90 Minutes Intravenous Every 12 hours 10/14/19 0540     10/13/19 0945  Vancomycin (VANCOCIN) 1,250 mg in sodium chloride 0.9 % 250 mL IVPB  Status:  Discontinued     1,250 mg 166.7 mL/hr over 90 Minutes Intravenous Every 12 hours 10/13/19 0935 10/14/19 0540   10/13/19 0930  Vancomycin (VANCOCIN) 1,250 mg in sodium chloride 0.9 % 250 mL IVPB  Status:  Discontinued     1,250 mg 166.7 mL/hr over 90 Minutes Intravenous Every 12 hours 10/13/19 0902 10/13/19 0934   10/09/19 2000  vancomycin (VANCOCIN) IVPB 1000 mg/200 mL premix  Status:  Discontinued     1,000 mg 200 mL/hr over 60 Minutes Intravenous Every 12 hours 10/09/19 1121 10/13/19 0902   10/04/19 2200  vancomycin (VANCOCIN) IVPB 750 mg/150 ml premix  Status:  Discontinued     750 mg 150 mL/hr over 60 Minutes Intravenous Every 8 hours 10/04/19 1305 10/09/19 1121   10/04/19 1400  vancomycin (VANCOCIN) IVPB 1000 mg/200 mL premix     1,000 mg 200 mL/hr over 60 Minutes Intravenous STAT 10/04/19 1305 10/04/19 1604   10/01/19 1100  rifampin (RIFADIN) capsule 300 mg     300 mg Oral Every 12 hours 10/01/19 1056     09/30/19 1800  Vancomycin (VANCOCIN) 1,250 mg in sodium chloride 0.9 %  250 mL IVPB  Status:  Discontinued     1,250 mg 166.7 mL/hr over 90 Minutes Intravenous Every 24 hours 09/30/19 0003 09/30/19 0747   09/30/19 1800  vancomycin (VANCOREADY) IVPB 1250 mg/250 mL  Status:  Discontinued     1,250 mg 166.7 mL/hr over 90 Minutes Intravenous Every 24 hours 09/30/19 0746 10/04/19 1305   09/30/19 0400  piperacillin-tazobactam (ZOSYN) IVPB 3.375 g  Status:  Discontinued     3.375 g 12.5 mL/hr over 240 Minutes Intravenous Every 8 hours 09/30/19 0003 09/30/19 1533   09/29/19 2145  piperacillin-tazobactam (ZOSYN) IVPB 3.375 g     3.375  g 12.5 mL/hr over 240 Minutes Intravenous STAT 09/29/19 2137 09/29/19 2148   09/29/19 2119  bacitracin 50,000 Units in sodium chloride 0.9 % 500 mL irrigation  Status:  Discontinued       As needed 09/29/19 2119 09/29/19 2249   09/29/19 2033  vancomycin (VANCOCIN) 1-5 GM/200ML-% IVPB    Note to Pharmacy: Toney Sang   : cabinet override      09/29/19 2033 09/30/19 0844       Objective: Vitals:   10/25/19 0351 10/25/19 0453 10/25/19 0633 10/25/19 0635  BP: 97/74 104/82 97/82 110/76  Pulse: (!) 102 100    Resp:      Temp: 97.8 F (36.6 C)     TempSrc: Oral     SpO2: 100%     Weight:      Height:        Intake/Output Summary (Last 24 hours) at 10/25/2019 0943 Last data filed at 10/24/2019 1300 Gross per 24 hour  Intake 243 ml  Output --  Net 243 ml   Filed Weights   09/29/19 2032  Weight: 49.9 kg      Data Reviewed: I have personally reviewed following labs and imaging studies  CBC: Recent Labs  Lab 10/21/19 0505  WBC 5.8  NEUTROABS 3.3  HGB 11.2*  HCT 34.7*  MCV 87.8  PLT 360   Basic Metabolic Panel: Recent Labs  Lab 10/21/19 0505 10/24/19 0639  NA 138 136  K 4.0 4.0  CL 102 101  CO2 28 24  GLUCOSE 93 172*  BUN 11 12  CREATININE 0.50 0.66  CALCIUM 8.9 8.9   GFR: Estimated Creatinine Clearance: 74.4 mL/min (by C-G formula based on SCr of 0.66 mg/dL). Liver Function Tests: No results for input(s): AST, ALT, ALKPHOS, BILITOT, PROT, ALBUMIN in the last 168 hours. No results for input(s): LIPASE, AMYLASE in the last 168 hours. No results for input(s): AMMONIA in the last 168 hours. Coagulation Profile: No results for input(s): INR, PROTIME in the last 168 hours. Cardiac Enzymes: No results for input(s): CKTOTAL, CKMB, CKMBINDEX, TROPONINI in the last 168 hours. BNP (last 3 results) No results for input(s): PROBNP in the last 8760 hours. HbA1C: No results for input(s): HGBA1C in the last 72 hours. CBG: No results for input(s): GLUCAP in the last  168 hours. Lipid Profile: No results for input(s): CHOL, HDL, LDLCALC, TRIG, CHOLHDL, LDLDIRECT in the last 72 hours. Thyroid Function Tests: No results for input(s): TSH, T4TOTAL, FREET4, T3FREE, THYROIDAB in the last 72 hours. Anemia Panel: No results for input(s): VITAMINB12, FOLATE, FERRITIN, TIBC, IRON, RETICCTPCT in the last 72 hours. Urine analysis:    Component Value Date/Time   COLORURINE YELLOW 07/24/2019 0637   APPEARANCEUR HAZY (A) 07/24/2019 0637   LABSPEC >1.030 (H) 07/24/2019 0637   PHURINE 6.0 07/24/2019 0637   GLUCOSEU NEGATIVE 07/24/2019 5621  [place] NEGATIVE 07/24/2019 [place] 07/24/2019 [place] 07/24/2019 0637   PROTEINUR 30 (A) 07/24/2019 0637   NITRITE NEGATIVE 07/24/2019 0637   LEUKOCYTESUR NEGATIVE 07/24/2019 0637   Sepsis Labs: @LABRCNTIP (procalcitonin:4,lacticidven:4) )No results found for this or any previous visit (from the past 240 hour(s)).       Radiology Studies: No results found.    Scheduled Meds: . amphetamine-dextroamphetamine  30 mg Oral BID  . ARIPiprazole  20 mg Oral Daily  . buPROPion  150 mg Oral Daily  . Chlorhexidine Gluconate Cloth  6 each Topical Q0600  . diclofenac Sodium  4 g Topical QID  . enoxaparin (LOVENOX) injection  40 mg Subcutaneous Q24H  . feeding supplement (ENSURE ENLIVE)  237 mL Oral BID BM  . gabapentin  200 mg Oral TID  . multivitamin with minerals  1 tablet Oral Daily  . nicotine  14 mg Transdermal Daily  . rifampin  300 mg Oral Q12H  . senna-docusate  1 tablet Oral BID  . sodium chloride flush  3 mL Intravenous Q12H   Continuous Infusions: . sodium chloride Stopped (10/19/19 1738)  . sodium chloride    . vancomycin 1,250 mg (10/24/19 2330)     LOS: 26 days      Debbe Odea, MD [facility] Pager: [email or web address] Password [facility] 10/25/2019, 9:43 AM

## 2019-10-25 NOTE — Plan of Care (Signed)
  Problem: Clinical Measurements: Goal: Will remain free from infection Outcome: Progressing   Problem: Activity: Goal: Risk for activity intolerance will decrease Outcome: Progressing   Problem: Coping: Goal: Level of anxiety will decrease Outcome: Progressing   Problem: Pain Managment: Goal: General experience of comfort will improve Outcome: Progressing   Problem: Skin Integrity: Goal: Risk for impaired skin integrity will decrease Outcome: Progressing   

## 2019-10-25 NOTE — Progress Notes (Addendum)
MEWS Yellow (2) Temp: 97.8 BP: 97/74 HR: 102 SpO2: 100% on room air  Not an acute change.  Traid on call notified. bolus order   Patient refused bolus

## 2019-10-25 NOTE — Plan of Care (Signed)
  Problem: Coping: Goal: Level of anxiety will decrease Outcome: Progressing   Problem: Pain Managment: Goal: General experience of comfort will improve Outcome: Progressing   

## 2019-10-26 MED ORDER — SALINE SPRAY 0.65 % NA SOLN
1.0000 | NASAL | Status: DC | PRN
  Administered 2019-10-26: 12:00:00 1 via NASAL
  Filled 2019-10-26 (×2): qty 44

## 2019-10-26 MED ORDER — GABAPENTIN 100 MG PO CAPS
100.0000 mg | ORAL_CAPSULE | Freq: Three times a day (TID) | ORAL | Status: DC
  Administered 2019-10-26 – 2019-11-13 (×53): 100 mg via ORAL
  Filled 2019-10-26 (×54): qty 1

## 2019-10-26 MED ORDER — GABAPENTIN 300 MG PO CAPS
300.0000 mg | ORAL_CAPSULE | Freq: Three times a day (TID) | ORAL | Status: DC
  Filled 2019-10-26: qty 1

## 2019-10-26 MED ORDER — SODIUM CHLORIDE 0.9 % IV SOLN
INTRAVENOUS | Status: DC | PRN
  Administered 2019-10-26: 10:00:00 1000 mL via INTRAVENOUS
  Administered 2019-11-02 – 2019-11-13 (×2): 250 mL via INTRAVENOUS

## 2019-10-26 MED ORDER — IBUPROFEN 200 MG PO TABS
600.0000 mg | ORAL_TABLET | Freq: Four times a day (QID) | ORAL | Status: DC
  Administered 2019-10-26 – 2019-10-27 (×2): 600 mg via ORAL
  Filled 2019-10-26 (×3): qty 3

## 2019-10-26 NOTE — Progress Notes (Signed)
PROGRESS NOTE    Colleen Ramirez   IWL:[id number]  DOB: 1980-12-25  DOA: 09/29/2019 PCP: Patient, No Pcp Per   Brief Narrative:  Colleen Ramirez  is an 39 y.o. female past medical history of IV drugs (methamphetamine) anemia and satiety status post gastric bypass and homeless who presents with a 2-day history of severe sharp neck pain constant.    She was just recently hospitalized and discharged on 08/25/2019 for facial cellulitis was on doxycycline twice daily for which she completed a 10-day course and clindamycin topical.    She underwent ACDF by neurosurgery on 09/29/2019. Intra-op culture grew MRSA she is currently on IV vancomycin and rifampin and will likely remain inpatient for the duration of her IV therapy due to her IVDU history.   Subjective: Does not want narcotics to be weaned.   Assessment & Plan:   Principal Problem:   Abscess in epidural space of cervical spine/ IVDU (intravenous drug user) - ACDF by neurosurgery in 09/29/2019- MRSA in intra-op cultures - cont Vanc and Rifampin until course completed on 2/22 followed by Doxycycline for at least 1 month -cont pain control with Neurontin, Dilaudid, Flexeril, Oxycodone and Lidoderm patch- will need to wean narcotics so she does not go into withdrawal from an abrupt stop on discharged - OK to get wound wet per Dr Zada Finders - able to ambulate independently now - 2/4> increased Neurontin to help with neuropathic pain - goal is to wean narcotics soon - 2/8- will d/c Dilaudid today, increase Neurontin again to treat neuropathic pain,  and add Motrin QID to help with muscular pain- continue Voltaren gel, K pad and Oxycodone 15 mg - she is very angry about weaning narcotics-  I told her that we will continue to wean narcotics from here on despite her resistance to do so. Per RN, the patient moves around the room independently now, takes showers without assistance and even walks into the hall to make requests of nursing staff without  exhibiting any physical signs of pain.    Active Problems:   Depression/ anxiety and ADD - resumed Adderal 30 bid, Welbutrin, Abilify and Valium per the patient's request     Homeless - social services requested to help   Nicotine abuse - cont Nicotine patch    Status post gastric bypass for obesity    Hepatitis C antibody positive    Time spent in minutes: 35 DVT prophylaxis: lovenox Code Status: full code Family Communication:  Disposition Plan: cont IV antibiotics in hospital until 2/22- she must be weaned off of narcotics but the time of discharges as she will not get prescriptions for narcotics or Valium when she is discharged. She is homeless and social work is aware about this. She will likely go to a shelter. Consultants:   ID  NS Procedures:   ACDF  PICC line Antimicrobials:  Anti-infectives (From admission, onward)   Start     Dose/Rate Route Frequency Ordered Stop   10/14/19 1000  vancomycin (VANCOREADY) IVPB 1250 mg/250 mL     1,250 mg 166.7 mL/hr over 90 Minutes Intravenous Every 12 hours 10/14/19 0540     10/13/19 0945  Vancomycin (VANCOCIN) 1,250 mg in sodium chloride 0.9 % 250 mL IVPB  Status:  Discontinued     1,250 mg 166.7 mL/hr over 90 Minutes Intravenous Every 12 hours 10/13/19 0935 10/14/19 0540   10/13/19 0930  Vancomycin (VANCOCIN) 1,250 mg in sodium chloride 0.9 % 250 mL IVPB  Status:  Discontinued  1,250 mg 166.7 mL/hr over 90 Minutes Intravenous Every 12 hours 10/13/19 0902 10/13/19 0934   10/09/19 2000  vancomycin (VANCOCIN) IVPB 1000 mg/200 mL premix  Status:  Discontinued     1,000 mg 200 mL/hr over 60 Minutes Intravenous Every 12 hours 10/09/19 1121 10/13/19 0902   10/04/19 2200  vancomycin (VANCOCIN) IVPB 750 mg/150 ml premix  Status:  Discontinued     750 mg 150 mL/hr over 60 Minutes Intravenous Every 8 hours 10/04/19 1305 10/09/19 1121   10/04/19 1400  vancomycin (VANCOCIN) IVPB 1000 mg/200 mL premix     1,000 mg 200 mL/hr over  60 Minutes Intravenous STAT 10/04/19 1305 10/04/19 1604   10/01/19 1100  rifampin (RIFADIN) capsule 300 mg     300 mg Oral Every 12 hours 10/01/19 1056     09/30/19 1800  Vancomycin (VANCOCIN) 1,250 mg in sodium chloride 0.9 % 250 mL IVPB  Status:  Discontinued     1,250 mg 166.7 mL/hr over 90 Minutes Intravenous Every 24 hours 09/30/19 0003 09/30/19 0747   09/30/19 1800  vancomycin (VANCOREADY) IVPB 1250 mg/250 mL  Status:  Discontinued     1,250 mg 166.7 mL/hr over 90 Minutes Intravenous Every 24 hours 09/30/19 0746 10/04/19 1305   09/30/19 0400  piperacillin-tazobactam (ZOSYN) IVPB 3.375 g  Status:  Discontinued     3.375 g 12.5 mL/hr over 240 Minutes Intravenous Every 8 hours 09/30/19 0003 09/30/19 1533   09/29/19 2145  piperacillin-tazobactam (ZOSYN) IVPB 3.375 g     3.375 g 12.5 mL/hr over 240 Minutes Intravenous STAT 09/29/19 2137 09/29/19 2148   09/29/19 2119  bacitracin 50,000 Units in sodium chloride 0.9 % 500 mL irrigation  Status:  Discontinued       As needed 09/29/19 2119 09/29/19 2249   09/29/19 2033  vancomycin (VANCOCIN) 1-5 GM/200ML-% IVPB    Note to Pharmacy: Toney Sang   : cabinet override      09/29/19 2033 09/30/19 0844       Objective: Vitals:   10/25/19 2110 10/26/19 0330 10/26/19 0459 10/26/19 0909  BP: 100/75  104/77 107/80  Pulse: 98  (!) 106 (!) 108  Resp: 12 12    Temp:   98.2 F (36.8 C) 98.7 F (37.1 C)  TempSrc:   Oral Oral  SpO2:   100% 100%  Weight:      Height:        Intake/Output Summary (Last 24 hours) at 10/26/2019 0942 Last data filed at 10/26/2019 0600 Gross per 24 hour  Intake 7976.57 ml  Output --  Net 7976.57 ml   Filed Weights   09/29/19 2032  Weight: 49.9 kg   General exam: Appears comfortable  HEENT: PERRLA, oral mucosa moist, no sclera icterus or thrush- tenderness in neck and shoulder muscles.  Respiratory system: Clear to auscultation. Respiratory effort normal. Cardiovascular system: S1 & S2 heard,  No murmurs   Gastrointestinal system: Abdomen soft, non-tender, nondistended. Normal bowel sounds   Central nervous system: Alert and oriented. No focal neurological deficits. Extremities: No cyanosis, clubbing or edema Skin: No rashes or ulcers- incision is healing Psychiatry:  Mood & affect appropriate.     Data Reviewed: I have personally reviewed following labs and imaging studies  CBC: Recent Labs  Lab 10/21/19 0505  WBC 5.8  NEUTROABS 3.3  HGB 11.2*  HCT 34.7*  MCV 87.8  PLT 360   Basic Metabolic Panel: Recent Labs  Lab 10/21/19 0505 10/24/19 0639  NA 138 136  K 4.0  4.0  CL 102 101  CO2 28 24  GLUCOSE 93 172*  BUN 11 12  CREATININE 0.50 0.66  CALCIUM 8.9 8.9   GFR: Estimated Creatinine Clearance: 74.4 mL/min (by C-G formula based on SCr of 0.66 mg/dL). Liver Function Tests: No results for input(s): AST, ALT, ALKPHOS, BILITOT, PROT, ALBUMIN in the last 168 hours. No results for input(s): LIPASE, AMYLASE in the last 168 hours. No results for input(s): AMMONIA in the last 168 hours. Coagulation Profile: No results for input(s): INR, PROTIME in the last 168 hours. Cardiac Enzymes: No results for input(s): CKTOTAL, CKMB, CKMBINDEX, TROPONINI in the last 168 hours. BNP (last 3 results) No results for input(s): PROBNP in the last 8760 hours. HbA1C: No results for input(s): HGBA1C in the last 72 hours. CBG: No results for input(s): GLUCAP in the last 168 hours. Lipid Profile: No results for input(s): CHOL, HDL, LDLCALC, TRIG, CHOLHDL, LDLDIRECT in the last 72 hours. Thyroid Function Tests: No results for input(s): TSH, T4TOTAL, FREET4, T3FREE, THYROIDAB in the last 72 hours. Anemia Panel: No results for input(s): VITAMINB12, FOLATE, FERRITIN, TIBC, IRON, RETICCTPCT in the last 72 hours. Urine analysis:    Component Value Date/Time   COLORURINE YELLOW 07/24/2019 0637   APPEARANCEUR HAZY (A) 07/24/2019 0637   LABSPEC >1.030 (H) 07/24/2019 0637   PHURINE 6.0 07/24/2019  0637   GLUCOSEU NEGATIVE 07/24/2019 0637   HGBUR NEGATIVE 07/24/2019 0637   BILIRUBINUR NEGATIVE 07/24/2019 0637   KETONESUR NEGATIVE 07/24/2019 0637   PROTEINUR 30 (A) 07/24/2019 0637   NITRITE NEGATIVE 07/24/2019 0637   LEUKOCYTESUR NEGATIVE 07/24/2019 0637   Sepsis Labs: @LABRCNTIP (procalcitonin:4,lacticidven:4) )No results found for this or any previous visit (from the past 240 hour(s)).       Radiology Studies: No results found.    Scheduled Meds: . amphetamine-dextroamphetamine  30 mg Oral BID  . ARIPiprazole  20 mg Oral Daily  . buPROPion  150 mg Oral Daily  . Chlorhexidine Gluconate Cloth  6 each Topical Q0600  . diclofenac Sodium  4 g Topical QID  . enoxaparin (LOVENOX) injection  40 mg Subcutaneous Q24H  . feeding supplement (ENSURE ENLIVE)  237 mL Oral BID BM  . gabapentin  300 mg Oral TID  . ibuprofen  600 mg Oral QID  . multivitamin with minerals  1 tablet Oral Daily  . nicotine  14 mg Transdermal Daily  . rifampin  300 mg Oral Q12H  . senna-docusate  1 tablet Oral BID  . sodium chloride flush  3 mL Intravenous Q12H   Continuous Infusions: . sodium chloride Stopped (10/24/19 1012)  . sodium chloride    . vancomycin 1,250 mg (10/25/19 2106)     LOS: 27 days      [id number], MD [facility] Pager: [email or web address] Password [facility] 10/26/2019, 9:42 AM

## 2019-10-26 NOTE — Plan of Care (Signed)
  Problem: Education: Goal: Knowledge of General Education information will improve Description: Including pain rating scale, medication(s)/side effects and non-pharmacologic comfort measures Outcome: Progressing   Problem: Clinical Measurements: Goal: Respiratory complications will improve Outcome: Progressing Note: On room air   Problem: Activity: Goal: Risk for activity intolerance will decrease Outcome: Progressing Note: Up independently in room walks great, walks around hallway with no trouble   Problem: Nutrition: Goal: Adequate nutrition will be maintained Outcome: Progressing   Problem: Coping: Goal: Level of anxiety will decrease Outcome: Progressing Note: Very anxious, thinks nobody is listening to her, wants MRI, neurosurgery came by and saw her, concerned about place on right upper arm, Dr. Butler Denmark notified, no new orders   Problem: Elimination: Goal: Will not experience complications related to bowel motility Outcome: Progressing Note: BM this shift Goal: Will not experience complications related to urinary retention Outcome: Progressing   Problem: Pain Managment: Goal: General experience of comfort will improve Outcome: Progressing Note: Continues to have neck pain, treated with oxycodone   Problem: Safety: Goal: Ability to remain free from injury will improve Outcome: Progressing

## 2019-10-26 NOTE — Progress Notes (Signed)
Occupational Therapy Treatment Patient Details Name: Colleen Ramirez MRN: [id number] DOB: [birth year] Today's Date: 10/26/2019    History of present illness Colleen Ramirez is a 39 y.o. F admitted to the ED with c/o severe neck pain secondary to a cervical epidural abscess. She is s/p C5-6 anterior cervical discectomy and instrumented fusion. PMH: IVDU, anemia, anxiety, depression, recent admission for facial cellulitis.    OT comments  This 39 yo female is functioning at a Mod I for all of her basic ADLs but remains with pain and weakness in Bil UEs and thus cannot do all basic ADLs in a normal way. She will continue to benefit from acute OT without need for follow up.  Follow Up Recommendations  No OT follow up    Equipment Recommendations  None recommended by OT       Precautions / Restrictions Precautions Precautions: Cervical Required Braces or Orthoses: Cervical Brace Cervical Brace: Soft collar;For comfort Restrictions Weight Bearing Restrictions: No       Mobility Bed Mobility Overal bed mobility: Independent                Transfers Overall transfer level: Independent                        ADL either performed or assessed with clinical judgement   ADL                                         General ADL Comments: Pt reports it is hard for her to put her hair up due to pain in right upper traps and supraspinatus muscles she is also worried about raising her arms higher than shoulder height--I informed her that if she was only raising her arms above shoulder height for this--it was okay.Pt is doing all of her own basic ADLs at a Mod I level.     Vision Baseline Vision/History: Wears glasses Wears Glasses: Reading only            Cognition Arousal/Alertness: Awake/alert Behavior During Therapy: WFL for tasks assessed/performed Overall Cognitive Status: Within Functional Limits for tasks assessed                                          Exercises  Other Exercises: Pt reported she has been doing theraband (Level 1) and theraputty (2-yellow) exercises; however she could not show me what she had been doing for sure with theraband. 1. I showed her how to do bicep curl while in bed by placing theraband under foot and doing bicep curls, 2. also educated her on horizontal BIL UE theraband stretch and unilateral UE pull while holding theraband stationary with one hand and then abducting with the other. 3. With theraputty I gave her the theraputty sequence hand out and went verbally through it with her, gave her coins and there were placed in the putty. 4. Gave her washbain with 11 items in it (washcloths, hygiene bottles, and socks) for her to put in higher shelf cabinet in room with RUE and take out with LUE.  Other Exercises: Pt says she has trouble using her arms to push herself up, her left wrist feels weak--she has to really focus on maintaining wrist at neutral (co-contracting it) when she picks up anything otherwise  her wrist ulnar deviates and she drops items (ie: water pitcher)           Pertinent Vitals/ Pain       Pain Assessment: 0-10 Pain Score: 7  Pain Location: right shoulder and neck Pain Descriptors / Indicators: Discomfort;Sore;Aching(increases with certain arm movements) Pain Intervention(s): Limited activity within patient's tolerance;Monitored during session         Frequency  Min 2X/week        Progress Toward Goals  OT Goals(current goals can now be found in the care plan section)  Progress towards OT goals: Goals met and updated - see care plan     Plan Frequency remains appropriate       AM-PAC OT "6 Clicks" Daily Activity     Outcome Measure   Help from another person eating meals?: None Help from another person taking care of personal grooming?: None Help from another person toileting, which includes using toliet, bedpan, or urinal?: None Help from another person  bathing (including washing, rinsing, drying)?: None Help from another person to put on and taking off regular upper body clothing?: None Help from another person to put on and taking off regular lower body clothing?: None 6 Click Score: 24    End of Session    OT Visit Diagnosis: Muscle weakness (generalized) (M62.81);Pain Pain - Right/Left: Right Pain - part of body: Shoulder(neck)   Activity Tolerance Patient tolerated treatment well   Patient Left in bed           Time: 7276-1848 OT Time Calculation (min): 28 min  Charges: OT General Charges $OT Visit: 1 Visit OT Treatments $Therapeutic Exercise: 23-37 mins  Cathy OTR/L [facility] [employer] Pager [phone] Office [phone]      10/26/2019, 3:56 PM

## 2019-10-26 NOTE — Progress Notes (Signed)
Pharmacy Antibiotic Note  Colleen Ramirez is a 39 y.o. female admitted on 09/29/2019 with MRSA cervical spine infection. Pharmacy has been consulted for Vancomycin dosing. Also on rifampin. Patient remains afebrile with stable renal function.  Last vancomycin level therapeutic. Next levels due 2/9 unless renal function changes. Tentative stop date for abx 11/10/19.   Plan: - Continue Vancomycin 1250 mg IV Q12 hrs - Rifampin 300mg  PO q12h - Watch renal function. - ID planning 6 weeks of treatment  Height: 5\' 3"  (160 cm) Weight: 110 lb (49.9 kg) IBW/kg (Calculated) : 52.4  Temp (24hrs), Avg:98.2 F (36.8 C), Min:97.8 F (36.6 C), Max:98.7 F (37.1 C)  Recent Labs  Lab 10/20/19 1403 10/20/19 2131 10/21/19 0505 10/24/19 0639  WBC  --   --  5.8  --   CREATININE  --   --  0.50 0.66  VANCOTROUGH  --  11*  --   --   VANCOPEAK 26*  --   --   --     Estimated Creatinine Clearance: 74.4 mL/min (by C-G formula based on SCr of 0.66 mg/dL).    No Known Allergies  Antimicrobials this admission: Zosyn 1/12 >> 1/13 Vanc 1/12 >> (2/23) Rifampin 1/14 >> (2/23)  Dose adjustments this admission: 1/16 & 1/17: 21 and <4 - 1g x 1 and adj to 750 mg/8h 1/19: VP 32, VT 11, AUC 521 - no change 1/22 VP/VT 29/14 AUC 542 - adjusted to 1000mg  q12h 1/26: VP/VT 24/9 AUC 420 - adjusted to 1250 mg q12h 2/2: VP 26, Vt11, AUC 480, Continue 1250mg  IV q12h  Microbiology results: 1/12 abscess cx - MRSA  Colleen Ramirez A. , PharmD, BCPS, FNKF Clinical Pharmacist [place] Please utilize Amion for appropriate phone number to reach the unit pharmacist [facility])   10/26/2019

## 2019-10-26 NOTE — Progress Notes (Signed)
Neurosurgery Service Progress Note  Subjective: No acute events overnight, complaining of neck and right shoulder pain  Objective: Vitals:   10/25/19 2110 10/26/19 0330 10/26/19 0459 10/26/19 0909  BP: 100/75  104/77 107/80  Pulse: 98  (!) 106 (!) 108  Resp: 12 12  18   Temp:   98.2 F (36.8 C) 98.7 F (37.1 C)  TempSrc:   Oral Oral  SpO2:   100% 100%  Weight:      Height:       Temp (24hrs), Avg:98.2 F (36.8 C), Min:97.8 F (36.6 C), Max:98.7 F (37.1 C)  CBC Latest Ref Rng & Units 10/21/2019 10/14/2019 10/09/2019  WBC 4.0 - 10.5 K/uL 5.8 8.9 11.7(H)  Hemoglobin 12.0 - 15.0 g/dL 11.2(L) 11.5(L) 11.8(L)  Hematocrit 36.0 - 46.0 % 34.7(L) 35.9(L) 36.5  Platelets 150 - 400 K/uL 360 638(H) 729(H)   BMP Latest Ref Rng & Units 10/24/2019 10/21/2019 10/18/2019  Glucose 70 - 99 mg/dL 172(H) 93 159(H)  BUN 6 - 20 mg/dL 12 11 12   Creatinine 0.44 - 1.00 mg/dL 0.66 0.50 0.65  Sodium 135 - 145 mmol/L 136 138 137  Potassium 3.5 - 5.1 mmol/L 4.0 4.0 4.0  Chloride 98 - 111 mmol/L 101 102 102  CO2 22 - 32 mmol/L 24 28 23   Calcium 8.9 - 10.3 mg/dL 8.9 8.9 9.0    Intake/Output Summary (Last 24 hours) at 10/26/2019 1141 Last data filed at 10/26/2019 1012 Gross per 24 hour  Intake 7979.57 ml  Output --  Net 7979.57 ml    Current Facility-Administered Medications:  .  0.9 %  sodium chloride infusion, 250 mL, Intravenous, Continuous, Oliviah Agostini, Joyice Faster, MD, Stopped at 10/24/19 1012 .  0.9 %  sodium chloride infusion, , Intravenous, PRN, Debbe Odea, MD, Last Rate: 10 mL/hr at 10/26/19 1018, 1,000 mL at 10/26/19 1018 .  acetaminophen (TYLENOL) tablet 650 mg, 650 mg, Oral, Q4H PRN, 650 mg at 10/03/19 1430 **OR** acetaminophen (TYLENOL) suppository 650 mg, 650 mg, Rectal, Q4H PRN, Judith Part, MD .  amphetamine-dextroamphetamine (ADDERALL) tablet 30 mg, 30 mg, Oral, BID, Rizwan, Saima, MD, 30 mg at 10/26/19 0542 .  ARIPiprazole (ABILIFY) tablet 20 mg, 20 mg, Oral, Daily, Rizwan, Saima, MD,  20 mg at 10/26/19 1010 .  buPROPion (WELLBUTRIN XL) 24 hr tablet 150 mg, 150 mg, Oral, Daily, Rizwan, Saima, MD, 150 mg at 10/26/19 1010 .  Chlorhexidine Gluconate Cloth 2 % PADS 6 each, 6 each, Topical, Q0600, Caren Griffins, MD, 6 each at 10/23/19 [phone] .  cyclobenzaprine (FLEXERIL) tablet 10 mg, 10 mg, Oral, TID PRN, Judith Part, MD, 10 mg at 10/25/19 1826 .  diazepam (VALIUM) tablet 5 mg, 5 mg, Oral, Q8H PRN, Debbe Odea, MD, 5 mg at 10/25/19 2328 .  diclofenac Sodium (VOLTAREN) 1 % topical gel 4 g, 4 g, Topical, QID, Rizwan, Saima, MD, 4 g at 10/26/19 1014 .  enoxaparin (LOVENOX) injection Colleen mg, Colleen mg, Subcutaneous, Q24H, Florencia Reasons, MD, Colleen mg at 10/10/19 2347 .  feeding supplement (ENSURE ENLIVE) (ENSURE ENLIVE) liquid 237 mL, 237 mL, Oral, BID BM, Tyvion Edmondson A, MD, 237 mL at 10/26/19 1029 .  gabapentin (NEURONTIN) capsule 100 mg, 100 mg, Oral, TID, Rizwan, Saima, MD .  ibuprofen (ADVIL) tablet 600 mg, 600 mg, Oral, QID, Rizwan, Saima, MD .  menthol-cetylpyridinium (CEPACOL) lozenge 3 mg, 1 lozenge, Oral, PRN **OR** phenol (CHLORASEPTIC) mouth spray 1 spray, 1 spray, Mouth/Throat, PRN, Brent Noto, Joyice Faster, MD .  multivitamin with minerals tablet  1 tablet, 1 tablet, Oral, Daily, Jadene Pierini, MD, 1 tablet at 10/26/19 1010 .  nicotine (NICODERM CQ - dosed in mg/24 hours) patch 14 mg, 14 mg, Transdermal, Daily, Rizwan, Saima, MD, 14 mg at 10/26/19 1012 .  ondansetron (ZOFRAN) tablet 4 mg, 4 mg, Oral, Q6H PRN **OR** ondansetron (ZOFRAN) injection 4 mg, 4 mg, Intravenous, Q6H PRN, Caleel Kiner A, MD .  oxyCODONE (Oxy IR/ROXICODONE) immediate release tablet 15 mg, 15 mg, Oral, Q3H PRN, Calvert Cantor, MD, 15 mg at 10/26/19 1010 .  polyethylene glycol (MIRALAX / GLYCOLAX) packet 17 g, 17 g, Oral, Daily PRN, Shonta Bourque A, MD .  rifampin (RIFADIN) capsule 300 mg, 300 mg, Oral, Q12H, Cliffton Asters, MD, 300 mg at 10/26/19 1029 .  senna-docusate (Senokot-S) tablet 1  tablet, 1 tablet, Oral, BID, Albertine Grates, MD, 1 tablet at 10/22/19 [phone] .  sodium chloride (OCEAN) 0.65 % nasal spray 1 spray, 1 spray, Each Nare, PRN, Rizwan, Saima, MD .  sodium chloride 0.9 % bolus 500 mL, 500 mL, Intravenous, Once, Blount, Xenia T, NP .  sodium chloride flush (NS) 0.9 % injection 3 mL, 3 mL, Intravenous, Q12H, Ogden Handlin A, MD, 3 mL at 10/26/19 1012 .  sodium chloride flush (NS) 0.9 % injection 3 mL, 3 mL, Intravenous, PRN, Jadene Pierini, MD .  traZODone (DESYREL) tablet 100 mg, 100 mg, Oral, QHS PRN, Sara Chu D, MD, 50 mg at 10/24/19 2331 .  vancomycin (VANCOREADY) IVPB 1250 mg/250 mL, 1,250 mg, Intravenous, Q12H, Marinda Elk, MD, Last Rate: 166.7 mL/hr at 10/26/19 1019, 1,250 mg at 10/26/19 1019   Physical Exam: AOx3, strength grossly 5/5x4 but pain limited proximally in BUE, mild fingertip numbness bilaterally Incision c/d/i, healing well Wearing soft cervical collar  Assessment & Plan: Colleen Ramirez. woman w/ polysubstance abuse, severe neck pain, cervical epidural abscess, 1/12 s/p C5-6 ACDF for abscess evacuation 2/2 MRSA.   -re-evaluated patient, she is complaining of RUE pain in her proximal shoulder that extends a few cm into the proximal arm, no extension past the elbow, no numbness or paresthesias in that distribution - worse with shoulder abduction/adduction, +empty can sign, I explained to her that this does not appear to be neurologic / radicular in origin -re-emphasized that she had a severe infection of multiple levels of her spine that also infiltrated all of the paraspinal / prevertebral musculature and that is going to hurt as it heals. I explained that inflammation / infection of one of these muscles would hurt, let alone multiple muscles on both sides. I also re-emphasized that, given her history of IVDU and that she is in the hospital because of this, I agree that she needs to get off of narcotic medications and I support the choice  of her primary team.  Jadene Pierini  10/26/19 11:41 AM

## 2019-10-27 LAB — BASIC METABOLIC PANEL
Anion gap: 10 (ref 5–15)
BUN: 6 mg/dL (ref 6–20)
CO2: 27 mmol/L (ref 22–32)
Calcium: 8.8 mg/dL — ABNORMAL LOW (ref 8.9–10.3)
Chloride: 102 mmol/L (ref 98–111)
Creatinine, Ser: 0.52 mg/dL (ref 0.44–1.00)
GFR calc Af Amer: >60 mL/min (ref >60)
GFR calc non Af Amer: >60 mL/min (ref >60)
Glucose, Bld: 89 mg/dL (ref 70–99)
Potassium: 4 mmol/L (ref 3.5–5.1)
Sodium: 139 mmol/L (ref 135–145)

## 2019-10-27 LAB — VANCOMYCIN, TROUGH: Vancomycin Tr: 12 ug/mL — ABNORMAL LOW (ref 15–20)

## 2019-10-27 LAB — VANCOMYCIN, PEAK: Vancomycin Pk: 20 ug/mL — ABNORMAL LOW (ref 30–40)

## 2019-10-27 MED ORDER — OXYMETAZOLINE HCL 0.05 % NA SOLN
1.0000 | Freq: Two times a day (BID) | NASAL | Status: DC | PRN
  Filled 2019-10-27: qty 30

## 2019-10-27 MED ORDER — SODIUM CHLORIDE 0.9% FLUSH
10.0000 mL | INTRAVENOUS | Status: DC | PRN
  Administered 2019-11-01 – 2019-12-11 (×6): 10 mL

## 2019-10-27 MED ORDER — VANCOMYCIN HCL 1500 MG/300ML IV SOLN
1500.0000 mg | Freq: Two times a day (BID) | INTRAVENOUS | Status: DC
  Administered 2019-10-28 – 2019-11-01 (×8): 1500 mg via INTRAVENOUS
  Filled 2019-10-27 (×10): qty 300

## 2019-10-27 MED ORDER — SODIUM CHLORIDE 0.9% FLUSH
10.0000 mL | Freq: Two times a day (BID) | INTRAVENOUS | Status: DC
  Administered 2019-10-27 – 2019-11-13 (×19): 10 mL
  Administered 2019-11-14: 10:00:00 20 mL
  Administered 2019-11-15 – 2019-11-20 (×9): 10 mL
  Administered 2019-11-21: 20 mL
  Administered 2019-11-21 – 2019-12-11 (×28): 10 mL

## 2019-10-27 MED ORDER — IBUPROFEN 200 MG PO TABS: 600.0000 mg | ORAL_TABLET | ORAL | Status: DC | PRN

## 2019-10-27 NOTE — Plan of Care (Signed)
  Problem: Education: Goal: Knowledge of General Education information will improve Description: Including pain rating scale, medication(s)/side effects and non-pharmacologic comfort measures Outcome: Progressing   Problem: Health Behavior/Discharge Planning: Goal: Ability to manage health-related needs will improve Outcome: Progressing   Problem: Clinical Measurements: Goal: Will remain free from infection Outcome: Progressing   Problem: Coping: Goal: Level of anxiety will decrease Outcome: Progressing   Problem: Pain Managment: Goal: General experience of comfort will improve Outcome: Progressing   Problem: Safety: Goal: Ability to remain free from injury will improve Outcome: Progressing   Problem: Skin Integrity: Goal: Risk for impaired skin integrity will decrease Outcome: Progressing   

## 2019-10-27 NOTE — Progress Notes (Signed)
PROGRESS NOTE    Colleen Ramirez   DXA:[id number]  DOB: [birth year]  DOA: 09/29/2019 PCP: Patient, No Pcp Per   Brief Narrative:  Colleen Ramirez  is an 39 y.o. female past medical history of IV drugs (methamphetamine) anemia and satiety status post gastric bypass and homeless who presents with a 2-day history of severe sharp neck pain constant.    She was just recently hospitalized and discharged on 08/25/2019 for facial cellulitis was on doxycycline twice daily for which she completed a 10-day course and clindamycin topical.    She underwent ACDF by neurosurgery on 09/29/2019. Intra-op culture grew MRSA she is currently on IV vancomycin and rifampin and will likely remain inpatient for the duration of her IV therapy due to her IVDU history.   Subjective: Pain around IV in right arm.  Assessment & Plan:   Principal Problem:   Abscess in epidural space of cervical spine/ IVDU (intravenous drug user) - ACDF by neurosurgery in 09/29/2019- MRSA in intra-op cultures - cont Vanc and Rifampin until course completed on 2/22 followed by Doxycycline for at least 1 month -cont pain control with Neurontin, Dilaudid, Flexeril, Oxycodone and Lidoderm patch- will need to wean narcotics so she does not go into withdrawal from an abrupt stop on discharged - OK to get wound wet per Dr Maurice Small - able to ambulate independently now - 2/4> increased Neurontin to help with neuropathic pain - goal is to wean narcotics soon - 2/8>> d/c Dilaudid, add PRN Advil, continue Voltaren gel, K pad and Oxycodone 15 mg (weaned from 20 mg)- she is very angry about weaning narcotics-  I told her that we will continue to wean narcotics from here on despite her resistance to do so. Per RN, the patient moves around the room independently now, takes showers without assistance and even walks into the hall to make requests of nursing staff without exhibiting any physical signs of pain.    Active Problems: Pain and swelling in  right arm - suspect this is related to the IV and I have advised to remove this and place it elsewhere- she is hesitant to have it removed- discussed with RN to continue to encourage the removal     Depression/ anxiety and ADD - resumed Adderal 30 bid, Welbutrin, Abilify and Valium per the patient's request     Homeless - social services requested to help   Nicotine abuse - cont Nicotine patch and wean every week    Status post gastric bypass for obesity    Hepatitis C antibody positive    Time spent in minutes: 35 DVT prophylaxis: lovenox Code Status: full code Family Communication:  Disposition Plan: cont IV antibiotics in hospital until 2/22- she must be weaned off of narcotics but the time of discharges as she will not get prescriptions for narcotics or Valium when she is discharged. She is homeless and social work is aware about this. She will likely go to a shelter. Consultants:   ID  NS Procedures:   ACDF  PICC line Antimicrobials:  Anti-infectives (From admission, onward)   Start     Dose/Rate Route Frequency Ordered Stop   10/14/19 1000  vancomycin (VANCOREADY) IVPB 1250 mg/250 mL     1,250 mg 166.7 mL/hr over 90 Minutes Intravenous Every 12 hours 10/14/19 0540     10/13/19 0945  Vancomycin (VANCOCIN) 1,250 mg in sodium chloride 0.9 % 250 mL IVPB  Status:  Discontinued     1,250 mg 166.7 mL/hr over 90 Minutes Intravenous  Every 12 hours 10/13/19 0935 10/14/19 0540   10/13/19 0930  Vancomycin (VANCOCIN) 1,250 mg in sodium chloride 0.9 % 250 mL IVPB  Status:  Discontinued     1,250 mg 166.7 mL/hr over 90 Minutes Intravenous Every 12 hours 10/13/19 0902 10/13/19 0934   10/09/19 2000  vancomycin (VANCOCIN) IVPB 1000 mg/200 mL premix  Status:  Discontinued     1,000 mg 200 mL/hr over 60 Minutes Intravenous Every 12 hours 10/09/19 1121 10/13/19 0902   10/04/19 2200  vancomycin (VANCOCIN) IVPB 750 mg/150 ml premix  Status:  Discontinued     750 mg 150 mL/hr over 60  Minutes Intravenous Every 8 hours 10/04/19 1305 10/09/19 1121   10/04/19 1400  vancomycin (VANCOCIN) IVPB 1000 mg/200 mL premix     1,000 mg 200 mL/hr over 60 Minutes Intravenous STAT 10/04/19 1305 10/04/19 1604   10/01/19 1100  rifampin (RIFADIN) capsule 300 mg     300 mg Oral Every 12 hours 10/01/19 1056     09/30/19 1800  Vancomycin (VANCOCIN) 1,250 mg in sodium chloride 0.9 % 250 mL IVPB  Status:  Discontinued     1,250 mg 166.7 mL/hr over 90 Minutes Intravenous Every 24 hours 09/30/19 0003 09/30/19 0747   09/30/19 1800  vancomycin (VANCOREADY) IVPB 1250 mg/250 mL  Status:  Discontinued     1,250 mg 166.7 mL/hr over 90 Minutes Intravenous Every 24 hours 09/30/19 0746 10/04/19 1305   09/30/19 0400  piperacillin-tazobactam (ZOSYN) IVPB 3.375 g  Status:  Discontinued     3.375 g 12.5 mL/hr over 240 Minutes Intravenous Every 8 hours 09/30/19 0003 09/30/19 1533   09/29/19 2145  piperacillin-tazobactam (ZOSYN) IVPB 3.375 g     3.375 g 12.5 mL/hr over 240 Minutes Intravenous STAT 09/29/19 2137 09/29/19 2148   09/29/19 2119  bacitracin 50,000 Units in sodium chloride 0.9 % 500 mL irrigation  Status:  Discontinued       As needed 09/29/19 2119 09/29/19 2249   09/29/19 2033  vancomycin (VANCOCIN) 1-5 GM/200ML-% IVPB    Note to Pharmacy: Toney Sang   : cabinet override      09/29/19 2033 09/30/19 0844       Objective: Vitals:   10/26/19 1720 10/26/19 1935 10/27/19 0411 10/27/19 0952  BP:  124/70 119/77 112/85  Pulse:  (!) 108 (!) 106 (!) 106  Resp:  18 17 17   Temp: 97.9 F (36.6 C) 98.3 F (36.8 C) 98.3 F (36.8 C) 97.9 F (36.6 C)  TempSrc: Oral Oral Oral Oral  SpO2:  100% 100% 100%  Weight:      Height:        Intake/Output Summary (Last 24 hours) at 10/27/2019 1103 Last data filed at 10/27/2019 0810 Gross per 24 hour  Intake 258.74 ml  Output --  Net 258.74 ml   Filed Weights   09/29/19 2032  Weight: 49.9 kg   General exam: Appears comfortable  HEENT: PERRLA, oral  mucosa moist, no sclera icterus or thrush Respiratory system: Clear to auscultation. Respiratory effort normal. Cardiovascular system: S1 & S2 heard,  No murmurs  Gastrointestinal system: Abdomen soft, non-tender, nondistended. Normal bowel sounds   Central nervous system: Alert and oriented. No focal neurological deficits. Extremities: No cyanosis, clubbing or edema- tenderness and mild edema around IV in right arm Skin: No rashes or ulcers Psychiatry:  Mood & affect appropriate.   Data Reviewed: I have personally reviewed following labs and imaging studies  CBC: Recent Labs  Lab 10/21/19 0505  WBC  5.8  NEUTROABS 3.3  HGB 11.2*  HCT 34.7*  MCV 87.8  PLT 829   Basic Metabolic Panel: Recent Labs  Lab 10/21/19 0505 10/24/19 0639 10/27/19 0518  NA 138 136 139  K 4.0 4.0 4.0  CL 102 101 102  CO2 28 24 27   GLUCOSE 93 172* 89  BUN 11 12 6   CREATININE 0.50 0.66 0.52  CALCIUM 8.9 8.9 8.8*   GFR: Estimated Creatinine Clearance: 74.4 mL/min (by C-G formula based on SCr of 0.52 mg/dL). Liver Function Tests: No results for input(s): AST, ALT, ALKPHOS, BILITOT, PROT, ALBUMIN in the last 168 hours. No results for input(s): LIPASE, AMYLASE in the last 168 hours. No results for input(s): AMMONIA in the last 168 hours. Coagulation Profile: No results for input(s): INR, PROTIME in the last 168 hours. Cardiac Enzymes: No results for input(s): CKTOTAL, CKMB, CKMBINDEX, TROPONINI in the last 168 hours. BNP (last 3 results) No results for input(s): PROBNP in the last 8760 hours. HbA1C: No results for input(s): HGBA1C in the last 72 hours. CBG: No results for input(s): GLUCAP in the last 168 hours. Lipid Profile: No results for input(s): CHOL, HDL, LDLCALC, TRIG, CHOLHDL, LDLDIRECT in the last 72 hours. Thyroid Function Tests: No results for input(s): TSH, T4TOTAL, FREET4, T3FREE, THYROIDAB in the last 72 hours. Anemia Panel: No results for input(s): VITAMINB12, FOLATE, FERRITIN,  TIBC, IRON, RETICCTPCT in the last 72 hours. Urine analysis:    Component Value Date/Time   COLORURINE YELLOW 07/24/2019 0637   APPEARANCEUR HAZY (A) 07/24/2019 0637   LABSPEC >1.030 (H) 07/24/2019 0637   PHURINE 6.0 07/24/2019 [place] 07/24/2019 0637   HGBUR NEGATIVE 07/24/2019 0637   [place] NEGATIVE 07/24/2019 [place] 07/24/2019 0637   PROTEINUR 30 (A) 07/24/2019 0637   NITRITE NEGATIVE 07/24/2019 0637   LEUKOCYTESUR NEGATIVE 07/24/2019 0637   Sepsis Labs: @LABRCNTIP (procalcitonin:4,lacticidven:4) )No results found for this or any previous visit (from the past 240 hour(s)).       Radiology Studies: No results found.    Scheduled Meds: . amphetamine-dextroamphetamine  30 mg Oral BID  . ARIPiprazole  20 mg Oral Daily  . buPROPion  150 mg Oral Daily  . Chlorhexidine Gluconate Cloth  6 each Topical Q0600  . diclofenac Sodium  4 g Topical QID  . enoxaparin (LOVENOX) injection  40 mg Subcutaneous Q24H  . feeding supplement (ENSURE ENLIVE)  237 mL Oral BID BM  . gabapentin  100 mg Oral TID  . multivitamin with minerals  1 tablet Oral Daily  . nicotine  14 mg Transdermal Daily  . rifampin  300 mg Oral Q12H  . senna-docusate  1 tablet Oral BID  . sodium chloride flush  3 mL Intravenous Q12H   Continuous Infusions: . sodium chloride Stopped (10/24/19 1012)  . sodium chloride Stopped (10/26/19 1414)  . sodium chloride    . vancomycin 1,250 mg (10/27/19 1030)     LOS: 28 days      Debbe Odea, MD [facility] Pager: [email or web address] Password [email or web address] 10/27/2019, 11:03 AM

## 2019-10-27 NOTE — Progress Notes (Signed)
Pharmacy Antibiotic Note  Colleen Ramirez is a 39 y.o. female admitted on 09/29/2019 with MRSA cervical spine infection. Pharmacy has been consulted for Vancomycin dosing. Also on rifampin. Patient remains afebrile with stable renal function.  Vancomycin levels collected 2/9 as follows: Vancomycin peak 20, trough of 12, calculated AUC 425 (aim for higher with cervical spine infection). Pt specific kinetics: Vd: 80.5L, Ke: 0.0731 T 1/2 9.5 hours. Tentative stop date for abx 11/10/19.   Renal function remains stable.  Plan: - Increase Vancomycin to 1500 mg IV Q12 hrs for calculated AUC ~510 - Rifampin 300mg  PO q12h - Watch renal function. - ID planning Vanc until 2/22 then d/c on oral Doxy for at least a month  Height: 5\' 3"  (160 cm) Weight: 110 lb (49.9 kg) IBW/kg (Calculated) : 52.4  Temp (24hrs), Avg:98.2 F (36.8 C), Min:97.9 F (36.6 C), Max:98.3 F (36.8 C)  Recent Labs  Lab 10/21/19 0505 10/24/19 0639 10/27/19 0518 10/27/19 1509 10/27/19 2208  WBC 5.8  --   --   --   --   CREATININE 0.50 0.66 0.52  --   --   VANCOTROUGH  --   --   --   --  12*  VANCOPEAK  --   --   --  20*  --     Estimated Creatinine Clearance: 74.4 mL/min (by C-G formula based on SCr of 0.52 mg/dL).    No Known Allergies  Antimicrobials this admission: Zosyn 1/12 >> 1/13 Vanc 1/12 >> (2/23) Rifampin 1/14 >> (2/23)  Dose adjustments this admission: 1/16 & 1/17: 21 and <4 - 1g x 1 and adj to 750 mg/8h 1/19: VP 32, VT 11, AUC 521 - no change 1/22 VP/VT 29/14 AUC 542 - adjusted to 1000mg  q12h 1/26: VP/VT 24/9 AUC 420 - adjusted to 1250 mg q12h 2/2: VP 26, Vt11, AUC 480, Continue 1250mg  IV q12h 2/9: VP 20, VT 12 AUC 425 - adjusted to 1500mg  IV q12h  Microbiology results: 1/12 abscess cx - MRSA  2/26, PharmD, BCPS Please see amion for complete clinical pharmacist phone list 10/27/2019

## 2019-10-28 DIAGNOSIS — M542 Cervicalgia: Secondary | ICD-10-CM

## 2019-10-28 LAB — CBC WITH DIFFERENTIAL/PLATELET
Abs Immature Granulocytes: 0.04 10*3/uL (ref 0.00–0.07)
Basophils Absolute: 0.1 10*3/uL (ref 0.0–0.1)
Basophils Relative: 1 %
Eosinophils Absolute: 0.2 10*3/uL (ref 0.0–0.5)
Eosinophils Relative: 3 %
HCT: 34.8 % — ABNORMAL LOW (ref 36.0–46.0)
Hemoglobin: 11.2 g/dL — ABNORMAL LOW (ref 12.0–15.0)
Immature Granulocytes: 1 %
Lymphocytes Relative: 24 %
Lymphs Abs: 1.6 10*3/uL (ref 0.7–4.0)
MCH: 28.4 pg (ref 26.0–34.0)
MCHC: 32.2 g/dL (ref 30.0–36.0)
MCV: 88.3 fL (ref 80.0–100.0)
Monocytes Absolute: 0.8 10*3/uL (ref 0.1–1.0)
Monocytes Relative: 11 %
Neutro Abs: 4.3 10*3/uL (ref 1.7–7.7)
Neutrophils Relative %: 60 %
Platelets: 309 10*3/uL (ref 150–400)
RBC: 3.94 MIL/uL (ref 3.87–5.11)
RDW: 15.2 % (ref 11.5–15.5)
WBC: 7 10*3/uL (ref 4.0–10.5)
nRBC: 0 % (ref 0.0–0.2)

## 2019-10-28 MED ORDER — ACETAMINOPHEN 500 MG PO TABS
1000.0000 mg | ORAL_TABLET | Freq: Three times a day (TID) | ORAL | Status: DC
  Administered 2019-10-28 – 2019-12-11 (×129): 1000 mg via ORAL
  Filled 2019-10-28 (×133): qty 2

## 2019-10-28 NOTE — Progress Notes (Signed)
Palliative consult received. Patient has difficult to manage pain secondary to the disease of addiction and recent ACDF with post op infection. She would be a good candidate for residential addiction treatment facility. Goals are recovery. If her conditions changes please re-consult or call with questions. Will sign off.  Anderson Malta, DO Palliative Medicine

## 2019-10-28 NOTE — Plan of Care (Signed)
  Problem: Education: Goal: Knowledge of General Education information will improve Description: Including pain rating scale, medication(s)/side effects and non-pharmacologic comfort measures 10/28/2019 1413 by Theo Dills, RN Outcome: Progressing 10/28/2019 1412 by Theo Dills, RN Reactivated   Problem: Health Behavior/Discharge Planning: Goal: Ability to manage health-related needs will improve 10/28/2019 1413 by Theo Dills, RN Outcome: Progressing 10/28/2019 1412 by Theo Dills, RN Reactivated   Problem: Clinical Measurements: Goal: Will remain free from infection 10/28/2019 1413 by Theo Dills, RN Outcome: Progressing 10/28/2019 1412 by Theo Dills, RN Reactivated Goal: Respiratory complications will improve 10/28/2019 1413 by Theo Dills, RN Outcome: Progressing 10/28/2019 1412 by Theo Dills, RN Reactivated Goal: Cardiovascular complication will be avoided 10/28/2019 1413 by Theo Dills, RN Outcome: Progressing 10/28/2019 1412 by Theo Dills, RN Reactivated   Problem: Coping: Goal: Level of anxiety will decrease 10/28/2019 1413 by Theo Dills, RN Outcome: Progressing 10/28/2019 1412 by Theo Dills, RN Reactivated   Problem: Pain Managment: Goal: General experience of comfort will improve 10/28/2019 1413 by Theo Dills, RN Outcome: Progressing 10/28/2019 1412 by Theo Dills, RN Reactivated   Problem: Safety: Goal: Ability to remain free from injury will improve 10/28/2019 1413 by Theo Dills, RN Outcome: Progressing 10/28/2019 1412 by Theo Dills, RN Reactivated   Problem: Skin Integrity: Goal: Risk for impaired skin integrity will decrease 10/28/2019 1413 by Theo Dills, RN Outcome: Progressing 10/28/2019 1412 by Theo Dills, RN Reactivated

## 2019-10-28 NOTE — Progress Notes (Signed)
PROGRESS NOTE   Colleen Ramirez  FOY:[id number]    DOB: March 25, 1981    DOA: 09/29/2019  PCP: Patient, No Pcp Per   I have briefly reviewed patients previous medical records in [facility].  Chief Complaint:   Chief Complaint  Patient presents with  . Generalized Body Aches    Brief Narrative:  39 year old female with PMH of IVDA (methamphetamine), anemia, obesity s/p gastric bypass (Roux-en-Y), homeless presented with 2 days history of severe sharp constant neck pain.  She was recently hospitalized and discharged on 08/25/2019 for facial cellulitis and completed a 10-day course of oral doxycycline and topical clindamycin. She underwent ACDF on 09/29/2019 for MRSA epidural abscess and is on IV vancomycin and oral rifampin through 11/09/2019 and will remain inpatient for the duration of her IV therapy due to her history of IV DA.  Hospital course complicated by pain issues.   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  S/p C5-6 ACDF by neurosurgery on 09/29/2019.  Cultures grew MRSA.  Dr. Johnsie Cancel followed up on 2/8: No neurologic/radicular etiology for her RUE/proximal shoulder pain.  He stated that she had a severe infection of multiple levels of her spine that infiltrated all of her paraspinal/prevertebral musculature and she is expected to have pain from the inflammation/infection and also agreed with getting her off of narcotic pain medications.  Continue IV vancomycin and oral rifampin per pharmacy through 11/09/2019 followed by doxycycline for a month.  Please see discussion below regarding pain control.  Pain control: This has been a difficult  issue.  She obviously has reasons to have acute pain as noted above.  At the same time, it is hard to know if there is any secondary gain given history of substance abuse.  IV Dilaudid discontinued 2/8.  Unable to use NSAIDs due to history of gastric bypass/Roux-en-Y.  Continue Neurontin 100 mg 3 times daily and consider increasing the dose, topical Voltaren gel, K pad, oxycodone wean down from 20mg  to 15 mg every 3 hours as needed.  As per previous documentation, patient has been functional i.e. moving around in the room independently, taking showers without assistance, walking the halls without exhibiting physical signs of pain.  Had requested PMT consultation for symptom management/pain control but unable to assist much.  Consider adding scheduled Tylenol 1 g 3 times daily.  Patient reports that she used to follow with pain management while she was in years ago.  Depression/anxiety/ADD: Continue Adderall, Abilify and Valium.  Homelessness: Difficult situation.  Social work likely on board.  Tobacco abuse: Continue nicotine patch and wean gradually.  S/p Roux-en-Y for obesity: Years ago while in [place].  Hepatitis C antibody positive: Outpatient follow-up with ID.  Anemia: Suspect chronic disease.  Stable.  Periodically follow   Body mass index is 19.49 kg/m.  Nutritional Status Nutrition Problem: Increased nutrient needs Etiology: post-op healing Signs/Symptoms: estimated needs Interventions: Ensure Enlive (each supplement provides 350kcal and 20 grams of protein), MVI  DVT prophylaxis: Lovenox Code Status: Full Family Communication: None at bedside Disposition:  . Patient came from: Homeless           .  Anticipated d/c place: To be determined.  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  She will likely go to a homeless shelter. . Barriers to d/c: Homeless and need for continued IV antibiotics through 2/22 due to history of IVDA.   Consultants:   Infectious  disease Neurosurgery  Procedures:   ACDF Midline  Antimicrobials:   As noted above   Subjective:  Patient interviewed and examined along with her female RN in room.  Reports ongoing neck pain issues, not satisfied with current pain regimen, asking if her pain medications can be increased, tramadol can be added, why IV Dilaudid was discontinued.  Had a long chat with her regarding complex pain management issues and advised her that we will not be increasing her opioid pain medicines at this time.  Advised trying to optimize nonopioid treatment options.  Objective:   Vitals:   10/27/19 1636 10/27/19 2031 10/28/19 0429 10/28/19 0730  BP: (!) 130/97 (!) 124/93 (!) 123/98 120/85  Pulse: (!) 105 100 100 80  Resp: 18 16 16 16   Temp: 98.3 F (36.8 C) 98.3 F (36.8 C) 98 F (36.7 C) 98.1 F (36.7 C)  TempSrc: Oral Oral Oral Oral  SpO2: 100% 100% 100% 100%  Weight:      Height:        General exam: Young female, moderately built and nourished, sitting up comfortably in chair without painful distress. Respiratory system: Clear to auscultation. Respiratory effort normal. Cardiovascular system: S1 & S2 heard, RRR. No JVD, murmurs, rubs, gallops or clicks. No pedal edema. Gastrointestinal system: Abdomen is nondistended, soft and nontender. No organomegaly or masses felt. Normal bowel sounds heard. Central nervous system: Alert and oriented. No focal neurological deficits. Extremities: Symmetric 5 x 5 power. Skin: No rashes, lesions or ulcers Psychiatry: Judgement and insight appear normal. Mood & affect anxious at times.     Data Reviewed:   I have personally reviewed following labs and imaging studies   CBC: Recent Labs  Lab 10/28/19 0609  WBC 7.0  NEUTROABS 4.3  HGB 11.2*  HCT 34.8*  MCV 88.3  PLT 416    Basic Metabolic Panel: Recent Labs  Lab 10/24/19 0639 10/27/19 0518  NA 136 139  K 4.0 4.0  CL 101 102  CO2 24 27  GLUCOSE 172* 89  BUN 12 6  CREATININE  0.66 0.52  CALCIUM 8.9 8.8*    Liver Function Tests: No results for input(s): AST, ALT, ALKPHOS, BILITOT, PROT, ALBUMIN in the last 168 hours.  CBG: No results for input(s): GLUCAP in the last 168 hours.  Microbiology Studies:  No results found for this or any previous visit (from the past 240 hour(s)).   Radiology Studies:  No results found.   Scheduled Meds:   . amphetamine-dextroamphetamine  30 mg Oral BID  . ARIPiprazole  20 mg Oral Daily  . buPROPion  150 mg Oral Daily  . Chlorhexidine Gluconate Cloth  6 each Topical Q0600  . diclofenac Sodium  4 g Topical QID  . enoxaparin (LOVENOX) injection  40 mg Subcutaneous Q24H  . feeding supplement (ENSURE ENLIVE)  237 mL Oral BID BM  . gabapentin  100 mg Oral TID  . multivitamin with minerals  1 tablet Oral Daily  . nicotine  14 mg Transdermal Daily  . rifampin  300 mg Oral Q12H  . senna-docusate  1 tablet Oral BID  . sodium chloride flush  10-40 mL Intracatheter Q12H  . sodium chloride flush  3 mL Intravenous Q12H  Continuous Infusions:   . sodium chloride Stopped (10/24/19 1012)  . sodium chloride Stopped (10/26/19 1414)  . sodium chloride    . vancomycin HCl 1,500 mg (10/28/19 1156)     LOS: 29 days     Marcellus Scott, MD, [place], [facility]. [facility]    To contact the attending provider between 7A-7P or the covering provider during after hours 7P-7A, please log into the web site [email or web address] and access using universal [place] password for that web site. If you do not have the password, please call the hospital operator.  10/28/2019, 2:57 PM

## 2019-10-29 NOTE — Progress Notes (Signed)
PROGRESS NOTE   Colleen Ramirez  KXF:[id number]    DOB: [birth year]    DOA: 09/29/2019  PCP: Patient, No Pcp Per   I have briefly reviewed patients previous medical records in [facility].  Chief Complaint:   Chief Complaint  Patient presents with  . Generalized Body Aches    Brief Narrative:  39 year old female with PMH of IVDA (methamphetamine), anemia, obesity s/p gastric bypass (Roux-en-Y), homeless, presented with 2 days history of severe sharp constant neck pain.  She was recently hospitalized and discharged on 08/25/2019 for facial cellulitis and completed a 10-day course of oral doxycycline and topical clindamycin. She underwent ACDF on 09/29/2019 for MRSA epidural abscess and is on IV vancomycin and oral rifampin through 11/09/2019 and will remain inpatient for the duration of her IV therapy due to her history of IVDA.  Hospital course complicated by pain issues which seem to be better controlled today.   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  S/p C5-6 ACDF by Neurosurgery on 09/29/2019.  Cultures grew MRSA.  Dr. Johnsie Cancel followed up on 2/8: No neurologic/radicular etiology for her RUE/proximal shoulder pain.  He stated that she had a severe infection of multiple levels of her spine that infiltrated all of her paraspinal/prevertebral musculature and she is expected to have pain from the inflammation/infection and also agreed with getting her off of narcotic pain medications.  Continue IV vancomycin and oral rifampin per pharmacy through 11/09/2019 followed by doxycycline for a month.  Please see discussion below regarding pain control.   Pain control: This has been a difficult issue.  She obviously has reasons to have acute pain as noted above.  At the same time, it is hard to know if there is any secondary gain given history of substance abuse.  IV Dilaudid discontinued 2/8.  Unable to use NSAIDs due to history of gastric bypass/Roux-en-Y.  Continue Neurontin 100 mg 3 times daily and consider increasing the dose, topical Voltaren gel, K pad, oxycodone weaned down from 20mg  to 15 mg every 3 hours as needed.  Added scheduled Tylenol 1 g 3 times daily which seems to be helping.  Reportedly followed with pain management in [place] years ago.  Appears quite functional, seen ambulating comfortably in the room without painful distress this morning, reportedly had a shower and washed her hair by herself.  No change in pain regimen but encouraged her to start requesting less frequent pain medicines and weaning down opioids.    Depression/anxiety/ADD: Continue Adderall, Abilify and Valium.  Patient states that her Adderall is scheduled at shift change in the morning and hence gets delayed.  Discussed with RN at bedside to have pharmacy reschedule it to an hour earlier.  Homelessness: Difficult situation.  Social work on .  Tobacco abuse: Continue nicotine patch and wean gradually.  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  Hepatitis C antibody positive: Outpatient follow-up with ID.  Anemia: Suspect chronic disease.  Stable.  Periodically follow   Body mass index is 19.49 kg/m.  Nutritional Status Nutrition Problem: Increased nutrient needs Etiology: post-op healing Signs/Symptoms: estimated needs  Interventions: Ensure Enlive (each supplement provides 350kcal and 20 grams of protein), MVI  DVT prophylaxis: Lovenox Code Status: Full Family Communication: None at bedside Disposition:  . Patient came from: Homeless           . Anticipated d/c place: To be determined.  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   She will likely go to a homeless shelter. . Barriers to d/c: Homeless and need for continued IV antibiotics through 2/22 due to history of IVDA.   Consultants:   Infectious disease Neurosurgery  Procedures:   ACDF Midline  Antimicrobials:   As noted above   Subjective:  Patient interviewed and examined this morning along with her RN in room.  Patient seen ambulating comfortably in the room and was just about to get on a video call on her smart phone with someone.  States that she slept well last night.  Pain seems to have been better controlled until she washed her hair and shower by herself this morning which seemed to increase her pain.  No other complaints reported.  Objective:   Vitals:   10/28/19 0730 10/28/19 2113 10/29/19 0604 10/29/19 0748  BP: 120/85 118/87 (!) 117/92 (!) 119/102  Pulse: 80 (!) 106 93 94  Resp: 16 18 17 18   Temp: 98.1 F (36.7 C)  98 F (36.7 C) 97.6 F (36.4 C)  TempSrc: Oral  Oral Oral  SpO2: 100% 99% 100% 98%  Weight:      Height:        General exam: Young female, moderately built and nourished, seen ambulating comfortably in the room.  Appears to be in good spirits. Respiratory system: Clear to auscultation. Respiratory effort normal. Cardiovascular system: S1 & S2 heard, RRR. No JVD, murmurs, rubs, gallops or clicks. No pedal edema. Gastrointestinal system: Abdomen is nondistended, soft and nontender. No organomegaly or masses felt. Normal bowel sounds heard. Central nervous system: Alert and oriented.  No focal neurological deficits. Extremities: Symmetric 5 x 5 power. Skin: No rashes, lesions or ulcers Psychiatry: Judgement and insight appear normal. Mood & affect pleasant and appropriate.     Data Reviewed:   I have personally reviewed following labs and imaging studies   CBC: Recent Labs  Lab 10/28/19 0609  WBC 7.0  NEUTROABS 4.3  HGB 11.2*  HCT 34.8*  MCV 88.3  PLT 767    Basic Metabolic Panel: Recent Labs  Lab  10/24/19 0639 10/27/19 0518  NA 136 139  K 4.0 4.0  CL 101 102  CO2 24 27  GLUCOSE 172* 89  BUN 12 6  CREATININE 0.66 0.52  CALCIUM 8.9 8.8*    Liver Function Tests: No results for input(s): AST, ALT, ALKPHOS, BILITOT, PROT, ALBUMIN in the last 168 hours.  CBG: No results for input(s): GLUCAP in the last 168 hours.  Microbiology Studies:  No results found for this or any previous visit (from the past 240 hour(s)).   Radiology Studies:  No results found.   Scheduled Meds:   . acetaminophen  1,000 mg Oral TID  . amphetamine-dextroamphetamine  30 mg Oral BID  . ARIPiprazole  20 mg Oral Daily  . buPROPion  150 mg Oral Daily  . Chlorhexidine Gluconate Cloth  6 each Topical Q0600  . diclofenac Sodium  4 g Topical QID  . enoxaparin (LOVENOX) injection  40 mg Subcutaneous Q24H  . feeding supplement (ENSURE ENLIVE)  237 mL Oral BID BM  . gabapentin  100 mg Oral TID  . multivitamin  with minerals  1 tablet Oral Daily  . nicotine  14 mg Transdermal Daily  . rifampin  300 mg Oral Q12H  . senna-docusate  1 tablet Oral BID  . sodium chloride flush  10-40 mL Intracatheter Q12H  . sodium chloride flush  3 mL Intravenous Q12H    Continuous Infusions:   . sodium chloride Stopped (10/24/19 1012)  . sodium chloride Stopped (10/29/19 0301)  . vancomycin HCl 1,500 mg (10/29/19 1102)     LOS: 30 days     Marcellus Scott, MD, [place], [facility]. [facility]    To contact the attending provider between 7A-7P or the covering provider during after hours 7P-7A, please log into the web site [email or web address] and access using universal [place] password for that web site. If you do not have the password, please call the hospital operator.  10/29/2019, 12:42 PM

## 2019-10-29 NOTE — Plan of Care (Signed)
  Problem: Education: Goal: Knowledge of General Education information will improve Description: Including pain rating scale, medication(s)/side effects and non-pharmacologic comfort measures Outcome: Completed/Met   Problem: Health Behavior/Discharge Planning: Goal: Ability to manage health-related needs will improve Outcome: Completed/Met   Problem: Clinical Measurements: Goal: Ability to maintain clinical measurements within normal limits will improve Outcome: Completed/Met   

## 2019-10-30 LAB — VANCOMYCIN, PEAK: Vancomycin Pk: 22 ug/mL — ABNORMAL LOW (ref 30–40)

## 2019-10-30 LAB — VANCOMYCIN, TROUGH: Vancomycin Tr: 16 ug/mL (ref 15–20)

## 2019-10-30 NOTE — Progress Notes (Signed)
Pharmacy Antibiotic Note  Canda Podgorski is a 39 y.o. female admitted on 09/29/2019 with MRSA cervical spine infection. Pharmacy has been consulted for Vancomycin dosing. Also on rifampin. Patient remains afebrile with stable renal function.  Vancomycin levels collected 2/12 as follows:  peak 22 (drawn late), trough 16, calculated AUC 500 (goal 400-550).   Renal function remains stable.  Plan: - Continue vanc 1500mg  IV Q12H - Rifampin 300mg  PO BID - Monitor renal fxn, clinical progress, weekly vanc AUC - ID planning Vanc until 2/22 then d/c on oral Doxy for at least a month  Height: 5\' 3"  (160 cm) Weight: 110 lb (49.9 kg) IBW/kg (Calculated) : 52.4  Temp (24hrs), Avg:98.6 F (37 C), Min:98.4 F (36.9 C), Max:98.8 F (37.1 C)  Recent Labs  Lab 10/24/19 0639 10/27/19 0518 10/27/19 1509 10/27/19 2208 10/28/19 0609 10/30/19 1625 10/30/19 2113  WBC  --   --   --   --  7.0  --   --   CREATININE 0.66 0.52  --   --   --   --   --   VANCOTROUGH  --   --   --  12*  --   --  16  VANCOPEAK  --   --  20*  --   --  22*  --     Estimated Creatinine Clearance: 74.4 mL/min (by C-G formula based on SCr of 0.52 mg/dL).    No Known Allergies  Antimicrobials this admission: Zosyn 1/12 >> 1/13 Vanc 1/12 >> (2/23) Rifampin 1/14 >> (2/23)  Dose adjustments this admission: 1/16 & 1/17: 21 and <4 - 1g x 1 and adj to 750 mg/8h 1/19: VP 32, VT 11, AUC 521 - no change 1/22 VP/VT 29/14 AUC 542 - adjusted to 1000mg  q12h 1/26: VP/VT 24/9 AUC 420 - adjusted to 1250 mg q12h 2/2: VP 26, Vt11, AUC 480, Continue 1250mg  IV q12h 2/9: VP 20, VT 12 AUC 425 - adjusted to 1500mg  IV q12h 2/12: VP 22 (drawn late), VT 16, AUC 500 >> con't 1500mg  q12  Microbiology results: 1/12 abscess cx - MRSA  Hafsa Lohn D. 2/26, PharmD, BCPS, BCCCP 10/30/2019, 10:27 PM

## 2019-10-30 NOTE — Progress Notes (Signed)
PROGRESS NOTE   Colleen Ramirez  BWI:[id number]    DOB: December 22, 1980    DOA: 09/29/2019  PCP: Patient, No Pcp Per   I have briefly reviewed patients previous medical records in [facility].  Chief Complaint:   Chief Complaint  Patient presents with  . Generalized Body Aches    Brief Narrative:  39 year old female with PMH of IVDA (methamphetamine), anemia, obesity s/p gastric bypass (Roux-en-Y), homeless, presented with 2 days history of severe sharp constant neck pain.  She was recently hospitalized and discharged on 08/25/2019 for facial cellulitis and completed a 10-day course of oral doxycycline and topical clindamycin. She underwent ACDF on 09/29/2019 for MRSA epidural abscess and is on IV vancomycin and oral rifampin through 11/09/2019 and will remain inpatient for the duration of her IV therapy due to her history of IVDA.  Hospital course complicated by pain issues which seem to be better controlled today.   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  S/p C5-6 ACDF by Neurosurgery on 09/29/2019.  Cultures grew MRSA.  Dr. Johnsie Cancel followed up on 2/8: No neurologic/radicular etiology for her RUE/proximal shoulder pain.  He stated that she had a severe infection of multiple levels of her spine that infiltrated all of her paraspinal/prevertebral musculature and she is expected to have pain from the inflammation/infection and also agreed with getting her off of narcotic pain medications.  Continue IV vancomycin and oral rifampin per pharmacy through 11/09/2019 followed by doxycycline for a month.  Please see discussion below regarding pain  control.  Pain control: This has been a difficult issue.  She obviously has reasons to have acute pain as noted above.  At the same time, it is hard to know if there is any secondary gain given history of substance abuse.  IV Dilaudid discontinued 2/8.  Unable to use NSAIDs due to history of gastric bypass/Roux-en-Y.  Continue Neurontin 100 mg 3 times daily and consider increasing the dose, topical Voltaren gel, K pad, oxycodone weaned down from 20mg  to 15 mg every 3 hours as needed.  Added scheduled Tylenol 1 g 3 times daily which seems to be helping.  Reportedly followed with pain management in [place] years ago.  Her pain appears to be reasonably controlled on current regimen, continue without change.  Depression/anxiety/ADD: Continue Adderall, Abilify and Valium.  Stable.  Homelessness: Difficult situation.  Social work on .  Tobacco abuse: Continue nicotine patch and wean gradually.  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  Hepatitis C antibody positive: Outpatient follow-up with ID.  Anemia: Suspect chronic disease.  Stable.  Periodically follow   Body mass index is 19.49 kg/m.  Nutritional Status Nutrition Problem: Increased nutrient needs Etiology: post-op healing Signs/Symptoms: estimated needs Interventions: Ensure Enlive (each supplement provides 350kcal and 20 grams of protein), MVI  DVT prophylaxis: Lovenox Code Status: Full Family Communication: None at bedside Disposition:  . Patient came from: Homeless           . Anticipated d/c place: To be determined.  She is homeless and [place] is aware.  She will likely go to  a homeless shelter. . Barriers to d/c: Homeless and need for continued IV antibiotics through 2/22 due to history of IVDA.   Consultants:   Infectious disease Neurosurgery  Procedures:   ACDF Midline  Antimicrobials:   As noted above   Subjective:  Patient reports pain in her left upper arm at site of previous IV  and?  Infiltration.  Objective:   Vitals:   10/29/19 1534 10/29/19 1933 10/30/19 0548 10/30/19 0900  BP: 119/83 (!) 132/94 112/78 138/81  Pulse: (!) 110 (!) 110 (!) 112 (!) 112  Resp: 17 19 18 18   Temp: 98.6 F (37 C) 98.8 F (37.1 C) 98.4 F (36.9 C)   TempSrc: Oral Oral Oral   SpO2: 100% 100% 99% 100%  Weight:      Height:        General exam: Young female, moderately built and nourished, sitting up comfortably in reclining chair without any distress. CNS: Alert and oriented.  No focal neurological deficits. Extremities: Midline over right upper arm without acute findings.  Left upper arm with some superficial tenderness but no acute findings.  No asymmetric swelling, redness, increased warmth, cordlike findings of thrombophlebitis.  Advised elevation of upper extremity and monitor. Psychiatry: Judgement and insight appear normal. Mood & affect pleasant and appropriate.     Data Reviewed:   I have personally reviewed following labs and imaging studies   CBC: Recent Labs  Lab 10/28/19 0609  WBC 7.0  NEUTROABS 4.3  HGB 11.2*  HCT 34.8*  MCV 88.3  PLT 309    Basic Metabolic Panel: Recent Labs  Lab 10/24/19 0639 10/27/19 0518  NA 136 139  K 4.0 4.0  CL 101 102  CO2 24 27  GLUCOSE 172* 89  BUN 12 6  CREATININE 0.66 0.52  CALCIUM 8.9 8.8*    Liver Function Tests: No results for input(s): AST, ALT, ALKPHOS, BILITOT, PROT, ALBUMIN in the last 168 hours.  CBG: No results for input(s): GLUCAP in the last 168 hours.  Microbiology Studies:  No results found for this or any previous visit (from the past 240 hour(s)).   Radiology Studies:  No results found.   Scheduled Meds:   . acetaminophen  1,000 mg Oral TID  . amphetamine-dextroamphetamine  30 mg Oral BID  . ARIPiprazole  20 mg Oral Daily  . buPROPion  150 mg Oral Daily  . Chlorhexidine Gluconate Cloth  6 each Topical Q0600  . diclofenac Sodium  4 g Topical QID  . enoxaparin (LOVENOX) injection   40 mg Subcutaneous Q24H  . feeding supplement (ENSURE ENLIVE)  237 mL Oral BID BM  . gabapentin  100 mg Oral TID  . multivitamin with minerals  1 tablet Oral Daily  . nicotine  14 mg Transdermal Daily  . rifampin  300 mg Oral Q12H  . senna-docusate  1 tablet Oral BID  . sodium chloride flush  10-40 mL Intracatheter Q12H  . sodium chloride flush  3 mL Intravenous Q12H    Continuous Infusions:   . sodium chloride Stopped (10/24/19 1012)  . sodium chloride Stopped (10/29/19 0301)  . vancomycin HCl 1,500 mg (10/30/19 1053)     LOS: 31 days     12/28/19, MD, [place], [facility]. [facility]    To contact the attending provider between 7A-7P or the covering provider during after hours 7P-7A, please log into the web site [email or web address] and access using universal [place] password for that web site. If you do not have the password,  please call the hospital operator.  10/30/2019, 1:57 PM

## 2019-10-31 ENCOUNTER — Inpatient Hospital Stay (HOSPITAL_COMMUNITY): Payer: Self-pay

## 2019-10-31 DIAGNOSIS — I82612 Acute embolism and thrombosis of superficial veins of left upper extremity: Secondary | ICD-10-CM

## 2019-10-31 DIAGNOSIS — R52 Pain, unspecified: Secondary | ICD-10-CM

## 2019-10-31 DIAGNOSIS — M7989 Other specified soft tissue disorders: Secondary | ICD-10-CM

## 2019-10-31 LAB — COMPREHENSIVE METABOLIC PANEL
ALT: 24 U/L (ref 0–44)
AST: 25 U/L (ref 15–41)
Albumin: 3 g/dL — ABNORMAL LOW (ref 3.5–5.0)
Alkaline Phosphatase: 78 U/L (ref 38–126)
Anion gap: 10 (ref 5–15)
BUN: 8 mg/dL (ref 6–20)
CO2: 24 mmol/L (ref 22–32)
Calcium: 9 mg/dL (ref 8.9–10.3)
Chloride: 102 mmol/L (ref 98–111)
Creatinine, Ser: 0.7 mg/dL (ref 0.44–1.00)
GFR calc Af Amer: >60 mL/min (ref >60)
GFR calc non Af Amer: >60 mL/min (ref >60)
Glucose, Bld: 111 mg/dL — ABNORMAL HIGH (ref 70–99)
Potassium: 3.9 mmol/L (ref 3.5–5.1)
Sodium: 136 mmol/L (ref 135–145)
Total Bilirubin: 0.7 mg/dL (ref 0.3–1.2)
Total Protein: 7.4 g/dL (ref 6.5–8.1)

## 2019-10-31 NOTE — Progress Notes (Signed)
Sent page to Dr Rana Snare about patient's complaint of increased pain in LUE old midline site. I also paged IV team to come and assess site. Will await further instruction.

## 2019-10-31 NOTE — Progress Notes (Signed)
PROGRESS NOTE   Colleen Ramirez  PJA:[id number]    DOB: July 10, 1981    DOA: 09/29/2019  PCP: Colleen Ramirez, No Pcp Per   I have briefly reviewed patients previous medical records in [name].  Chief Complaint:   Chief Complaint  Colleen Ramirez presents with  . Generalized Body Aches    Brief Narrative:  39 year old female with PMH of IVDA (methamphetamine), anemia, obesity s/p gastric bypass (Roux-en-Y), homeless, presented with 2 days history of severe sharp constant neck pain.  She was recently hospitalized and discharged on 08/25/2019 for facial cellulitis and completed a 10-day course of oral doxycycline and topical clindamycin. She underwent ACDF on 09/29/2019 for MRSA epidural abscess and is on IV vancomycin and oral rifampin through 11/09/2019 and will remain inpatient for the duration of her IV therapy due to her history of IVDA.  Hospital course complicated by subjective pain issues.   Assessment & Plan:  Principal Problem:   Abscess in epidural space of cervical spine Active Problems:   Depression   Acquired fusion of cervical spine   IVDU (intravenous drug user)   Homeless   S/P cervical discectomy   Anxiety   Status post gastric bypass for obesity   Hepatitis C antibody positive in blood   MRSA cervical epidural abscess: In a Colleen Ramirez with history of IVDA.  S/p C5-6 ACDF by Neurosurgery on 09/29/2019.  Cultures grew MRSA.  Dr. Johnsie Cancel followed up on 2/8: No neurologic/radicular etiology for her RUE/proximal shoulder pain.  He stated that she had a severe infection of multiple levels of her spine that infiltrated all of her paraspinal/prevertebral musculature and she is expected to have pain from the inflammation/infection and also agreed with getting her off of narcotic pain medications.  Continue IV vancomycin and oral rifampin per pharmacy through 11/09/2019 followed by doxycycline for a month.  Please see discussion below regarding pain control.  Pain control: This has been a  difficult issue.  She obviously has reasons to have acute pain as noted above.  At the same time, it is hard to know if there is any secondary gain given history of substance abuse.  IV Dilaudid discontinued 2/8.  Unable to use NSAIDs due to history of gastric bypass/Roux-en-Y.  Continue Neurontin 100 mg 3 times daily and consider increasing the dose, topical Voltaren gel, K pad, oxycodone weaned down from 20mg  to 15 mg every 3 hours as needed.  Added scheduled Tylenol 1 g 3 times daily which seems to be helping.  Reportedly followed with pain management in [place] years ago.  Almost daily discussion regarding Colleen Ramirez's reported pain somewhere all of the other.  Objectively does not appear to be in any distress.  She has occasional mild sinus tachycardia and mildly elevated blood pressures of unclear etiology but not felt to be due to pain issues.  Left upper extremity superficial vein thrombosis in cephalic and basilic veins: As per left upper extremity venous Doppler preliminary report by vascular tech.  Likely provoked by previous IV midline at that site.  Unable to use NSAIDs due to gastric bypass history.  Continue other supportive treatment: Elevate limb, heat/cold as preferred, topical Voltaren.  Depression/anxiety/ADD: Continue Adderall, Abilify and Valium.  Stable.  Homelessness: Difficult situation.  Social work on .  Tobacco abuse: Continue nicotine patch and wean gradually.  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  Hepatitis C antibody positive: Outpatient follow-up with ID.  Anemia: Suspect chronic disease.  Stable.  Periodically follow   Body  mass index is 19.49 kg/m.  Nutritional Status Nutrition Problem: Increased nutrient needs Etiology: post-op healing Signs/Symptoms: estimated needs Interventions: Ensure Enlive (each supplement provides 350kcal and 20 grams of protein), MVI  DVT prophylaxis: Lovenox Code Status: Full Family Communication: None at  bedside Disposition:  . Colleen Ramirez came from: Homeless           . Anticipated d/c place: To be determined.  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  She will likely go to a homeless shelter. . Barriers to d/c: Homeless and need for continued IV antibiotics through 2/22 due to history of IVDA.   Consultants:   Infectious disease Neurosurgery  Procedures:   ACDF Midline  Antimicrobials:   As noted above   Subjective:  Colleen Ramirez interviewed and examined along with her female RN in room.  Overnight events noted.  Ongoing pain in left upper arm.  "I only slept for 3.5 minutes overnight".  Did not elaborate why.  No other complaints reported.  Objective:   Vitals:   10/30/19 2130 10/30/19 2330 10/31/19 0633 10/31/19 0834  BP: 133/89 (!) 138/94 (!) 140/98 (!) 139/98  Pulse: (!) 101 (!) 108 (!) 106 96  Resp: 18 18 18 18   Temp: 98.6 F (37 C) 98.4 F (36.9 C) 98.7 F (37.1 C) 99.3 F (37.4 C)  TempSrc: Oral Oral Oral Oral  SpO2: 99% 100%  98%  Weight:      Height:        General exam: Young female, moderately built and nourished, sitting up in chair without distress.  Smiling.  No pain or respiratory distress noted. CNS: Alert and oriented.  No focal neurological deficits. Extremities: Midline over right upper arm without acute findings.  Left upper arm with some superficial tenderness and questionable swelling on medial aspect, but no other acute findings.  No redness, increased warmth, cordlike findings of thrombophlebitis.   Psychiatry: Judgement and insight appear normal. Mood & affect pleasant and appropriate.     Data Reviewed:   I have personally reviewed following labs and imaging studies   CBC: Recent Labs  Lab 10/28/19 0609  WBC 7.0  NEUTROABS 4.3  HGB 11.2*  HCT 34.8*  MCV 88.3  PLT 096    Basic Metabolic Panel: Recent Labs  Lab 10/27/19 0518 10/31/19 0324  NA 139 136  K 4.0 3.9  CL 102 102  CO2 27 24  GLUCOSE 89 111*  BUN 6 8  CREATININE  0.52 0.70  CALCIUM 8.8* 9.0    Liver Function Tests: Recent Labs  Lab 10/31/19 0324  AST 25  ALT 24  ALKPHOS 78  BILITOT 0.7  PROT 7.4  ALBUMIN 3.0*    CBG: No results for input(s): GLUCAP in the last 168 hours.  Microbiology Studies:  No results found for this or any previous visit (from the past 240 hour(s)).   Radiology Studies:  No results found.   Scheduled Meds:   . acetaminophen  1,000 mg Oral TID  . amphetamine-dextroamphetamine  30 mg Oral BID  . ARIPiprazole  20 mg Oral Daily  . buPROPion  150 mg Oral Daily  . Chlorhexidine Gluconate Cloth  6 each Topical Q0600  . diclofenac Sodium  4 g Topical QID  . enoxaparin (LOVENOX) injection  40 mg Subcutaneous Q24H  . feeding supplement (ENSURE ENLIVE)  237 mL Oral BID BM  . gabapentin  100 mg Oral TID  . multivitamin with minerals  1 tablet Oral Daily  . nicotine  14 mg  Transdermal Daily  . rifampin  300 mg Oral Q12H  . senna-docusate  1 tablet Oral BID  . sodium chloride flush  10-40 mL Intracatheter Q12H  . sodium chloride flush  3 mL Intravenous Q12H    Continuous Infusions:   . sodium chloride Stopped (10/24/19 1012)  . sodium chloride Stopped (10/29/19 0301)  . vancomycin HCl 1,500 mg (10/31/19 1035)     LOS: 32 days     Marcellus Scott, MD, [place], [facility]. [facility]    To contact the attending provider between 7A-7P or the covering provider during after hours 7P-7A, please log into the web site [email or web address] and access using universal [place] password for that web site. If you do not have the password, please call the hospital operator.  10/31/2019, 12:08 PM

## 2019-10-31 NOTE — Progress Notes (Signed)
VASCULAR LAB PRELIMINARY  PRELIMINARY  PRELIMINARY  PRELIMINARY  Left upper extremity venous duplex completed.    Preliminary report:  See CV proc for preliminary results.  Messaged Dr. Waymon Amato with results.   Danaja Lasota, RVT 10/31/2019, 1:30 PM

## 2019-10-31 NOTE — Progress Notes (Signed)
Measured LUE and marked line. The circumference is 30cm.

## 2019-10-31 NOTE — Plan of Care (Signed)
  Problem: Clinical Measurements: Goal: Will remain free from infection Outcome: Progressing   Problem: Activity: Goal: Risk for activity intolerance will decrease Outcome: Progressing   Problem: Coping: Goal: Level of anxiety will decrease Outcome: Progressing   Problem: Elimination: Goal: Will not experience complications related to bowel motility Outcome: Progressing   

## 2019-11-01 ENCOUNTER — Inpatient Hospital Stay (HOSPITAL_COMMUNITY): Payer: Self-pay

## 2019-11-01 LAB — C-REACTIVE PROTEIN: CRP: 17.8 mg/dL — ABNORMAL HIGH (ref <1.0)

## 2019-11-01 LAB — SEDIMENTATION RATE: Sed Rate: 57 mm/hr — ABNORMAL HIGH (ref 0–22)

## 2019-11-01 MED ORDER — VANCOMYCIN HCL 1500 MG/300ML IV SOLN
1500.0000 mg | Freq: Two times a day (BID) | INTRAVENOUS | Status: DC
  Administered 2019-11-01 – 2019-11-05 (×8): 1500 mg via INTRAVENOUS
  Filled 2019-11-01 (×9): qty 300

## 2019-11-01 MED ORDER — GADOBUTROL 1 MMOL/ML IV SOLN: 5.0000 mL | Freq: Once | INTRAVENOUS | Status: DC | PRN

## 2019-11-01 MED ORDER — GADOBUTROL 1 MMOL/ML IV SOLN
5.0000 mL | Freq: Once | INTRAVENOUS | Status: AC | PRN
  Administered 2019-11-01: 14:00:00 5 mL via INTRAVENOUS

## 2019-11-01 MED ORDER — HYDROMORPHONE HCL 1 MG/ML IJ SOLN
1.0000 mg | Freq: Once | INTRAMUSCULAR | Status: AC
  Administered 2019-11-01: 1 mg via INTRAVENOUS
  Filled 2019-11-01: qty 1

## 2019-11-01 MED ORDER — METOPROLOL TARTRATE 5 MG/5ML IV SOLN
5.0000 mg | Freq: Once | INTRAVENOUS | Status: AC
  Administered 2019-11-01: 5 mg via INTRAVENOUS
  Filled 2019-11-01: qty 5

## 2019-11-01 NOTE — Progress Notes (Signed)
Notified Dr Rana Snare that MEWS yellow for pulse tonight. Pt's HR has been 122 @ 2242, 138 @ 0100, 135 @0210  (pt on the phone agitated and up in room). PRN medication given as ordered and requested. Patient now resting and HR is now 98.

## 2019-11-01 NOTE — Progress Notes (Signed)
Subjective: I was asked by Dr. Waymon Amato to see this patient because of complaints of increasing neck pain.  He has been trying to wean this patient off her opioid analgesics preparation for discharge.  The patient tells me that as they have weaned her medications, she has had increasing posterior neck pain and upper thoracic pain.  Objective: Vital signs in last 24 hours: Temp:  [97.9 F (36.6 C)-98.7 F (37.1 C)] 98.6 F (37 C) (02/14 0845) Pulse Rate:  [96-138] 96 (02/14 0845) Resp:  [16-18] 16 (02/14 0845) BP: (110-146)/(81-93) 134/86 (02/14 0845) SpO2:  [97 %-100 %] 98 % (02/14 0845) Estimated body mass index is 19.49 kg/m as calculated from the following:   Height as of this encounter: 5\' 3"  (1.6 m).   Weight as of this encounter: 49.9 kg.   Intake/Output from previous day: 02/13 0701 - 02/14 0700 In: 720 [P.O.:720] Out: 5 [Urine:4; Stool:1] Intake/Output this shift: Total I/O In: 240 [P.O.:240] Out: 1 [Urine:1]  Physical exam the patient is alert.  She is in no apparent distress.  She is argumentative not entirely cooperative.  She goes off on tangents and often needs to be redirected.  Her anterior cervical incision has healed well.  Moving all 4 extremities well but guards the right upper extremity.  Palpation of her posterior cervical and thoracic spine is unremarkable.  Lab Results: No results for input(s): WBC, HGB, HCT, PLT in the last 72 hours. BMET Recent Labs    10/31/19 0324  NA 136  K 3.9  CL 102  CO2 24  GLUCOSE 111*  BUN 8  CREATININE 0.70  CALCIUM 9.0    Studies/Results: VAS 11/02/19 UPPER EXTREMITY VENOUS DUPLEX  Result Date: 10/31/2019 UPPER VENOUS STUDY  Indications: Pain, and recent central line in left arm Comparison Study: No prior study on file for comparison Performing Technologist: 11/02/2019 RVS  Examination Guidelines: A complete evaluation includes B-mode imaging, spectral Doppler, color Doppler, and power Doppler as needed of all  accessible portions of each vessel. Bilateral testing is considered an integral part of a complete examination. Limited examinations for reoccurring indications may be performed as noted.  Right Findings: +----------+------------+---------+-----------+----------+-------+ RIGHT     CompressiblePhasicitySpontaneousPropertiesSummary +----------+------------+---------+-----------+----------+-------+ Subclavian               Yes       Yes                      +----------+------------+---------+-----------+----------+-------+  Left Findings: +----------+------------+---------+-----------+----------+---------------------+ LEFT      CompressiblePhasicitySpontaneousProperties       Summary        +----------+------------+---------+-----------+----------+---------------------+ IJV           Full       Yes       Yes                                    +----------+------------+---------+-----------+----------+---------------------+ Subclavian               Yes       Yes                                    +----------+------------+---------+-----------+----------+---------------------+ Axillary      Full       Yes       Yes                                    +----------+------------+---------+-----------+----------+---------------------+  Brachial      Full       Yes       Yes                                    +----------+------------+---------+-----------+----------+---------------------+ Radial                                               patent by color and                                                             Doppler        +----------+------------+---------+-----------+----------+---------------------+ Ulnar                                                patent by color and                                                             Doppler        +----------+------------+---------+-----------+----------+---------------------+ Cephalic       None                                          Acute         +----------+------------+---------+-----------+----------+---------------------+ Basilic       None                                          Acute         +----------+------------+---------+-----------+----------+---------------------+  Summary:  Right: No evidence of thrombosis in the subclavian.  Left: Findings consistent with acute superficial vein thrombosis involving the left basilic vein and left cephalic vein. SVT noted in upper arm in both the Basilic and Cephalic veins.  *See table(s) above for measurements and observations.  Diagnosing physician: Sherald Hess MD Electronically signed by Sherald Hess MD on 10/31/2019 at 2:55:42 PM.    Final     Assessment/Plan: Neck pain, thoracic spine pain, cervical osteomyelitis: I have discussed the situation with the patient.  I have explained to her that the reason her pain medications are being weaned off, is that with her history of drug abuse, it is unlikely she will be discharged with large doses of opioids pain medications, if any at all.  She tells me that this is not the issue.  She had multiple questions about her diagnosis, the cause, etc.  I have answered all those to the best of my abilities.  I recommend we work her up further with a cervical and thoracic MRI to make sure her infection/osteomyelitis  is being adequately treated.  LOS: 33 days     Ophelia Charter 11/01/2019, 11:09 AM

## 2019-11-01 NOTE — Plan of Care (Signed)
  Problem: Clinical Measurements: Goal: Will remain free from infection Outcome: Progressing   Problem: Activity: Goal: Risk for activity intolerance will decrease Outcome: Progressing   Problem: Pain Managment: Goal: General experience of comfort will improve Outcome: Progressing   Problem: Safety: Goal: Ability to remain free from injury will improve Outcome: Progressing   

## 2019-11-01 NOTE — Progress Notes (Addendum)
PROGRESS NOTE   Colleen Ramirez  KYH:[id number]    DOB: Oct 10, 1980    DOA: 09/29/2019  PCP: Patient, No Pcp Per   I have briefly reviewed patients previous medical records in [facility].  Chief Complaint:   Chief Complaint  Patient presents with  . Generalized Body Aches    Brief Narrative:  39 year old female with PMH of IVDA (methamphetamine), anemia, obesity s/p gastric bypass (Roux-en-Y), homeless, presented with 2 days history of severe sharp constant neck pain.  She was recently hospitalized and discharged on 08/25/2019 for facial cellulitis and completed a 10-day course of oral doxycycline and topical clindamycin. She underwent ACDF on 09/29/2019 for MRSA epidural abscess and is on IV vancomycin and oral rifampin through 11/09/2019 and will remain inpatient for the duration of her IV therapy due to her history of IVDA.  Hospital course complicated by subjective pain issues.  Neurosurgery reconsulted due to worsening neck pain 2/14, getting MRI C and T-spine to evaluate.   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  S/p C5-6 ACDF by Neurosurgery on 09/29/2019.  Cultures grew MRSA.  Dr. Venetia Constable followed up on 2/8: No neurologic/radicular etiology for her RUE/proximal shoulder pain.  He stated that she had a severe infection of multiple levels of her spine that infiltrated all of her paraspinal/prevertebral musculature and she is expected to have pain from the inflammation/infection and also agreed with getting her off of narcotic pain medications.  Continue IV vancomycin and oral rifampin per pharmacy through 11/09/2019 followed by doxycycline  for a month.  Please see discussion below regarding pain control.  On 2/14, patient reports that she woke up with worsening neck pain.  I consulted neurosurgery, discussed with Dr. Arnoldo Morale who has since evaluated her and requested MRI of cervical and thoracic spine to further evaluate.  CRP 17.8.  Pain control: This has been a difficult issue.  She obviously has reasons to have acute pain as noted above.  At the same time, it is hard to know if there is any secondary gain given history of substance abuse.  IV Dilaudid discontinued 2/8.  Unable to use NSAIDs due to history of gastric bypass/Roux-en-Y.  Continue Neurontin 100 mg 3 times daily and consider increasing the dose, topical Voltaren gel, K pad, oxycodone weaned down from 20mg  to 15 mg every 3 hours as needed prior to my taking over her care 5 days ago.  Added scheduled Tylenol 1 g 3 times daily which seems to be helping.  Reportedly followed with pain management in [place] years ago.  Patient has been reluctant to wean opioids since I took over her care.  Today she reports worsening pain.  Evaluation as noted above.  Dilaudid 1 mg x 1 dose and monitor.  Left upper extremity superficial vein thrombosis in cephalic and basilic veins: As per left upper extremity venous Doppler preliminary report by vascular tech.  Likely provoked by previous IV midline at that site.  Unable to use NSAIDs due to gastric bypass history.  Continue other supportive treatment: Elevate limb, heat/cold as preferred, topical Voltaren.  Reports that this feels better today.  Depression/anxiety/ADD: Continue Adderall, Abilify and Valium.  Stable.  Homelessness: Difficult situation.  Social work on Theatre manager.  Tobacco abuse: Wean nicotine patch down to 7 mg patch.  S/p Roux-en-Y for obesity: Years ago while in [place].  Hepatitis C antibody positive: Outpatient follow-up with ID.  Anemia: Suspect chronic disease.  Stable.  Periodically follow.  Check labs tomorrow.   Body  mass index is 19.49 kg/m.  Nutritional Status Nutrition Problem: Increased nutrient needs Etiology: post-op healing Signs/Symptoms: estimated needs Interventions: Ensure Enlive (each supplement provides 350kcal and 20 grams of protein), MVI  DVT prophylaxis: Lovenox Code Status: Full Family Communication: None at bedside Disposition:  . Patient came from: Homeless           . Anticipated d/c place: To be determined.  She is homeless and Child psychotherapist is aware.  She will likely go to a homeless shelter. . Barriers to d/c: Homeless and need for continued IV antibiotics through 2/22 due to history of IVDA.   Consultants:   Infectious disease Neurosurgery  Procedures:   ACDF Midline  Antimicrobials:   As noted above   Subjective:  Patient interviewed and examined along with her female RN at bedside.  Reports that she slept well last not for about 5 hours but woke up tearful and then severe neck pain.  Reports right shoulder discomfort as well.  Left upper arm pain from yesterday is better.  No upper extremity tingling, numbness or weakness reported.  Objective:   Vitals:   11/01/19 0100 11/01/19 0210 11/01/19 0300 11/01/19 0845  BP: 116/82 130/86 110/81 134/86  Pulse: (!) 138 (!) 135 98 96  Resp:  16 16 16   Temp:  98.7 F (37.1 C) 98.5 F (36.9 C) 98.6 F (37 C)  TempSrc:  Oral Oral Oral  SpO2:  97%  98%  Weight:      Height:        General exam: Young female, moderately built and nourished, appeared to be tearful and in some pain, able to sit up by herself at edge of bed without assistance. CNS: Alert and oriented.  No cranial nerve deficits. Extremities: Midline over right upper arm without acute findings.  Left upper arm with less tenderness.  No other acute findings appreciated.  Power in extremities seems normal.  However has some restricted elevation at right shoulder due to neck pain. Neck: Anterior ACDF scar has healed well.  Right cervical paraspinal muscles  with some stiffness/spasm but otherwise nontender and no acute findings appreciated. Psychiatry: Judgement and insight appear normal. Mood & affect tearful this morning.    Data Reviewed:   I have personally reviewed following labs and imaging studies   CBC: Recent Labs  Lab 10/28/19 0609  WBC 7.0  NEUTROABS 4.3  HGB 11.2*  HCT 34.8*  MCV 88.3  PLT 309    Basic Metabolic Panel: Recent Labs  Lab 10/27/19 0518 10/31/19 0324  NA 139 136  K 4.0 3.9  CL 102 102  CO2 27 24  GLUCOSE 89 111*  BUN 6 8  CREATININE 0.52 0.70  CALCIUM 8.8* 9.0    Liver Function Tests: Recent Labs  Lab 10/31/19 0324  AST 25  ALT 24  ALKPHOS 78  BILITOT 0.7  PROT 7.4  ALBUMIN 3.0*    CBG: No results for input(s): GLUCAP in the last 168 hours.  Microbiology Studies:  No results found for this or any previous visit (from the past 240 hour(s)).   Radiology Studies:  No  results found.   Scheduled Meds:   . acetaminophen  1,000 mg Oral TID  . amphetamine-dextroamphetamine  30 mg Oral BID  . ARIPiprazole  20 mg Oral Daily  . buPROPion  150 mg Oral Daily  . Chlorhexidine Gluconate Cloth  6 each Topical Q0600  . diclofenac Sodium  4 g Topical QID  . enoxaparin (LOVENOX) injection  40 mg Subcutaneous Q24H  . feeding supplement (ENSURE ENLIVE)  237 mL Oral BID BM  . gabapentin  100 mg Oral TID  . multivitamin with minerals  1 tablet Oral Daily  . nicotine  14 mg Transdermal Daily  . rifampin  300 mg Oral Q12H  . senna-docusate  1 tablet Oral BID  . sodium chloride flush  10-40 mL Intracatheter Q12H  . sodium chloride flush  3 mL Intravenous Q12H    Continuous Infusions:   . sodium chloride Stopped (10/24/19 1012)  . sodium chloride Stopped (10/29/19 0301)  . vancomycin HCl       LOS: 33 days     Marcellus Scott, MD, [place], [facility]. [facility]    To contact the attending provider between 7A-7P or the covering provider during after hours 7P-7A, please log into the  web site [email or web address] and access using universal [place] password for that web site. If you do not have the password, please call the hospital operator.  11/01/2019, 1:07 PM

## 2019-11-01 NOTE — Progress Notes (Signed)
   Vital Signs MEWS/VS Documentation      11/01/2019 2030 11/01/2019 2045 11/01/2019 2100 11/01/2019 2115   MEWS Score:  5  4  3  3    MEWS Score Color:  Red  Red  Yellow  Yellow   Resp:  15  19  20  19    Pulse:  (!) 131  (!) 134  (!) 139  (!) 135   BP:  (!) 147/86  129/85  132/77  124/73   Temp:  (!) 103 F (39.4 C)  (!) 101 F (38.3 C)  99.9 F (37.7 C)  99.8 F (37.7 C)   O2 Device:  Room Air  Room  Room Air   Level of Consciousness:  Alert  --  --  --           11/01/2019,9:37 PM Temp 103 with HR 131.  Tylenol given, with temp down to 101 15 minutes after tylenol given, then to 99.9, 30 minutes later.  Nurse Dawn said that she saw the patient with heating pad over her head, crying, shaking, getting panicky, complaining of pain in the neck, telling nurse her whole history of admission. HR now 139. Bodenheimer, NP notified with orders to give IV lopressor x1 and monitor.  Team with be in tomorrow morning to evaluate further.  David from [facility] notified as well, with instructions to keep monitoring.  Frequent vital for red MEWS that is now down to Yellow.

## 2019-11-01 NOTE — Plan of Care (Signed)
  Problem: Pain Managment: Goal: General experience of comfort will improve Outcome: Progressing   Problem: Safety: Goal: Ability to remain free from injury will improve Outcome: Progressing   Problem: Skin Integrity: Goal: Risk for impaired skin integrity will decrease Outcome: Progressing   

## 2019-11-02 ENCOUNTER — Inpatient Hospital Stay (HOSPITAL_COMMUNITY): Payer: Self-pay

## 2019-11-02 DIAGNOSIS — M7989 Other specified soft tissue disorders: Secondary | ICD-10-CM

## 2019-11-02 DIAGNOSIS — R52 Pain, unspecified: Secondary | ICD-10-CM

## 2019-11-02 LAB — COMPREHENSIVE METABOLIC PANEL
ALT: 24 U/L (ref 0–44)
AST: 25 U/L (ref 15–41)
Albumin: 2.5 g/dL — ABNORMAL LOW (ref 3.5–5.0)
Alkaline Phosphatase: 77 U/L (ref 38–126)
Anion gap: 10 (ref 5–15)
BUN: 6 mg/dL (ref 6–20)
CO2: 23 mmol/L (ref 22–32)
Calcium: 8.5 mg/dL — ABNORMAL LOW (ref 8.9–10.3)
Chloride: 103 mmol/L (ref 98–111)
Creatinine, Ser: 0.51 mg/dL (ref 0.44–1.00)
GFR calc Af Amer: >60 mL/min (ref >60)
GFR calc non Af Amer: >60 mL/min (ref >60)
Glucose, Bld: 77 mg/dL (ref 70–99)
Potassium: 3.7 mmol/L (ref 3.5–5.1)
Sodium: 136 mmol/L (ref 135–145)
Total Bilirubin: 0.5 mg/dL (ref 0.3–1.2)
Total Protein: 6.3 g/dL — ABNORMAL LOW (ref 6.5–8.1)

## 2019-11-02 LAB — CBC WITH DIFFERENTIAL/PLATELET
Abs Immature Granulocytes: 0.03 10*3/uL (ref 0.00–0.07)
Basophils Absolute: 0 10*3/uL (ref 0.0–0.1)
Basophils Relative: 1 %
Eosinophils Absolute: 0.1 10*3/uL (ref 0.0–0.5)
Eosinophils Relative: 2 %
HCT: 32.4 % — ABNORMAL LOW (ref 36.0–46.0)
Hemoglobin: 10.6 g/dL — ABNORMAL LOW (ref 12.0–15.0)
Immature Granulocytes: 0 %
Lymphocytes Relative: 20 %
Lymphs Abs: 1.5 10*3/uL (ref 0.7–4.0)
MCH: 28.3 pg (ref 26.0–34.0)
MCHC: 32.7 g/dL (ref 30.0–36.0)
MCV: 86.4 fL (ref 80.0–100.0)
Monocytes Absolute: 1 10*3/uL (ref 0.1–1.0)
Monocytes Relative: 13 %
Neutro Abs: 5 10*3/uL (ref 1.7–7.7)
Neutrophils Relative %: 64 %
Platelets: 301 10*3/uL (ref 150–400)
RBC: 3.75 MIL/uL — ABNORMAL LOW (ref 3.87–5.11)
RDW: 14.6 % (ref 11.5–15.5)
WBC: 7.7 10*3/uL (ref 4.0–10.5)
nRBC: 0 % (ref 0.0–0.2)

## 2019-11-02 MED ORDER — ENOXAPARIN SODIUM 60 MG/0.6ML ~~LOC~~ SOLN
50.0000 mg | Freq: Two times a day (BID) | SUBCUTANEOUS | Status: DC
  Administered 2019-11-02 – 2019-11-09 (×15): 50 mg via SUBCUTANEOUS
  Filled 2019-11-02 (×15): qty 0.6

## 2019-11-02 MED ORDER — HYDROMORPHONE HCL 1 MG/ML IJ SOLN
1.0000 mg | Freq: Three times a day (TID) | INTRAMUSCULAR | Status: AC | PRN
  Administered 2019-11-02 – 2019-11-03 (×2): 1 mg via INTRAVENOUS
  Filled 2019-11-02 (×2): qty 1

## 2019-11-02 NOTE — Progress Notes (Signed)
Right upper extremity venous duplex exam completed.  Preliminary results can be found under CV proc under chart review.  Messaged Dr. Waymon Amato with results.  11/02/2019 12:04 PM  Gari Hartsell, K., RDMS, RVT

## 2019-11-02 NOTE — Progress Notes (Addendum)
Subjective:  Complaining of worsening neck pain along with right upper extremity pain and swelling  Antibiotics:  Anti-infectives (From admission, onward)   Start     Dose/Rate Route Frequency Ordered Stop   11/01/19 1400  vancomycin (VANCOREADY) IVPB 1500 mg/300 mL     1,500 mg 150 mL/hr over 120 Minutes Intravenous Every 12 hours 11/01/19 1305     10/28/19 1000  vancomycin (VANCOREADY) IVPB 1500 mg/300 mL  Status:  Discontinued     1,500 mg 150 mL/hr over 120 Minutes Intravenous Every 12 hours 10/27/19 2353 11/01/19 1305   10/14/19 1000  vancomycin (VANCOREADY) IVPB 1250 mg/250 mL  Status:  Discontinued     1,250 mg 166.7 mL/hr over 90 Minutes Intravenous Every 12 hours 10/14/19 0540 10/27/19 2353   10/13/19 0945  Vancomycin (VANCOCIN) 1,250 mg in sodium chloride 0.9 % 250 mL IVPB  Status:  Discontinued     1,250 mg 166.7 mL/hr over 90 Minutes Intravenous Every 12 hours 10/13/19 0935 10/14/19 0540   10/13/19 0930  Vancomycin (VANCOCIN) 1,250 mg in sodium chloride 0.9 % 250 mL IVPB  Status:  Discontinued     1,250 mg 166.7 mL/hr over 90 Minutes Intravenous Every 12 hours 10/13/19 0902 10/13/19 0934   10/09/19 2000  vancomycin (VANCOCIN) IVPB 1000 mg/200 mL premix  Status:  Discontinued     1,000 mg 200 mL/hr over 60 Minutes Intravenous Every 12 hours 10/09/19 1121 10/13/19 0902   10/04/19 2200  vancomycin (VANCOCIN) IVPB 750 mg/150 ml premix  Status:  Discontinued     750 mg 150 mL/hr over 60 Minutes Intravenous Every 8 hours 10/04/19 1305 10/09/19 1121   10/04/19 1400  vancomycin (VANCOCIN) IVPB 1000 mg/200 mL premix     1,000 mg 200 mL/hr over 60 Minutes Intravenous STAT 10/04/19 1305 10/04/19 1604   10/01/19 1100  rifampin (RIFADIN) capsule 300 mg     300 mg Oral Every 12 hours 10/01/19 1056     09/30/19 1800  Vancomycin (VANCOCIN) 1,250 mg in sodium chloride 0.9 % 250 mL IVPB  Status:  Discontinued     1,250 mg 166.7 mL/hr over 90 Minutes Intravenous Every 24  hours 09/30/19 0003 09/30/19 0747   09/30/19 1800  vancomycin (VANCOREADY) IVPB 1250 mg/250 mL  Status:  Discontinued     1,250 mg 166.7 mL/hr over 90 Minutes Intravenous Every 24 hours 09/30/19 0746 10/04/19 1305   09/30/19 0400  piperacillin-tazobactam (ZOSYN) IVPB 3.375 g  Status:  Discontinued     3.375 g 12.5 mL/hr over 240 Minutes Intravenous Every 8 hours 09/30/19 0003 09/30/19 1533   09/29/19 2145  piperacillin-tazobactam (ZOSYN) IVPB 3.375 g     3.375 g 12.5 mL/hr over 240 Minutes Intravenous STAT 09/29/19 2137 09/29/19 2148   09/29/19 2119  bacitracin 50,000 Units in sodium chloride 0.9 % 500 mL irrigation  Status:  Discontinued       As needed 09/29/19 2119 09/29/19 2249   09/29/19 2033  vancomycin (VANCOCIN) 1-5 GM/200ML-% IVPB    Note to Pharmacy: Ubaldo Glassing   : cabinet override      09/29/19 2033 09/30/19 0844      Medications: Scheduled Meds: . acetaminophen  1,000 mg Oral TID  . amphetamine-dextroamphetamine  30 mg Oral BID  . ARIPiprazole  20 mg Oral Daily  . buPROPion  150 mg Oral Daily  . Chlorhexidine Gluconate Cloth  6 each Topical Q0600  . diclofenac Sodium  4 g Topical QID  .  enoxaparin (LOVENOX) injection  40 mg Subcutaneous Q24H  . feeding supplement (ENSURE ENLIVE)  237 mL Oral BID BM  . gabapentin  100 mg Oral TID  . multivitamin with minerals  1 tablet Oral Daily  . nicotine  14 mg Transdermal Daily  . rifampin  300 mg Oral Q12H  . senna-docusate  1 tablet Oral BID  . sodium chloride flush  10-40 mL Intracatheter Q12H  . sodium chloride flush  3 mL Intravenous Q12H   Continuous Infusions: . sodium chloride Stopped (10/24/19 1012)  . sodium chloride 250 mL (11/02/19 0043)  . vancomycin HCl Stopped (11/02/19 0257)   PRN Meds:.sodium chloride, cyclobenzaprine, diazepam, gadobutrol, ondansetron **OR** ondansetron (ZOFRAN) IV, oxyCODONE, oxymetazoline, polyethylene glycol, sodium chloride, sodium chloride flush, sodium chloride flush,  traZODone    Objective: Weight change:   Intake/Output Summary (Last 24 hours) at 11/02/2019 1135 Last data filed at 11/02/2019 0900 Gross per 24 hour  Intake 1750 ml  Output 3 ml  Net 1747 ml   Blood pressure 111/75, pulse (!) 120, temperature 99 F (37.2 C), temperature source Oral, resp. rate 17, height 5\' 3"  (1.6 m), weight 49.9 kg, SpO2 100 %. Temp:  [98.3 F (36.8 C)-103 F (39.4 C)] 99 F (37.2 C) (02/15 0750) Pulse Rate:  [98-139] 120 (02/15 0750) Resp:  [15-20] 17 (02/15 0750) BP: (98-147)/(62-87) 111/75 (02/15 0750) SpO2:  [98 %-100 %] 100 % (02/15 0750)  Physical Exam: General: Alert and awake, oriented x3, not in any acute distress. HEENT: anicteric sclera, EOMI CVS regular rate, normal  Chest: , no wheezing, no respiratory distress Abdomen: soft non-distended,  Extremities: no edema or deformity noted bilaterally Skin: no rashes Neuro: nonfocal  CBC:    BMET Recent Labs    10/31/19 0324  NA 136  K 3.9  CL 102  CO2 24  GLUCOSE 111*  BUN 8  CREATININE 0.70  CALCIUM 9.0     Liver Panel  Recent Labs    10/31/19 0324  PROT 7.4  ALBUMIN 3.0*  AST 25  ALT 24  ALKPHOS 78  BILITOT 0.7       Sedimentation Rate Recent Labs    11/01/19 1125  ESRSEDRATE 57*   C-Reactive Protein Recent Labs    11/01/19 1125  CRP 17.8*    Micro Results: No results found for this or any previous visit (from the past 720 hour(s)).  Studies/Results: MR CERVICAL SPINE W WO CONTRAST  Result Date: 11/01/2019 CLINICAL DATA:  Neck pain, infection or inflammation suspected. Increasing neck pain. Additional history obtained from electronic medical record, status post C5-C6 ACDF 09/29/2019. EXAM: MRI CERVICAL SPINE WITHOUT AND WITH CONTRAST TECHNIQUE: Multiplanar and multiecho pulse sequences of the cervical spine, to include the craniocervical junction and cervicothoracic junction, were obtained without and with intravenous contrast. CONTRAST:  20mL GADAVIST  GADOBUTROL 1 MMOL/ML IV SOLN COMPARISON:  MRI cervical and thoracic spine 09/29/2019 FINDINGS: Alignment: Reversal of the expected cervical lordosis centered at C5-C6. Vertebrae: Interval regression of imaging features of discitis/osteomyelitis with abnormal T1 hypointense and T2/STIR hyperintense signal abnormality as well as abnormal enhancement having progressed within the C4, C5, C6 and C7 vertebrae. Additionally, there is signal abnormality and abnormal enhancement as well as osseous erosion involving the bilateral C2-C3 facet joints and right C3-C4 facet joint likely reflecting septic arthritis. There is also abnormal edema and enhancement involving the left C6-C7 facet joint. Abnormal enhancing phlegmon extends into the neural foramina bilaterally at multiple levels. Cord: No spinal cord signal abnormality or  abnormal enhancement. Posterior Fossa, vertebral arteries, paraspinal tissues: No abnormality identified within included portions of the posterior fossa. Persistent ventral epidural phlegmon extending from the C3 level caudally to the C7 level. Ventral greater than dorsal epidural phlegmon is greatest at the C4-C7 levels measuring up to 4 mm in thickness. There is new signal abnormality and abnormal enhancement within the C5-C6 and C6-C7 interspinous spaces consistent with soft tissue infection. There is persistent prevertebral/retropharyngeal phlegmon spanning the C2-C7 levels measuring up to 10 mm in thickness. No prevertebral/retropharyngeal abscess is identified, although susceptibility artifact limits evaluation of the C5-C6 levels. Persistent myositis of the longus coli musculature. There is also edema within the parapharyngeal spaces bilaterally. Disc levels: As compared to prior examination, persistent although decreased spinal canal stenosis with persistent contact upon the ventral spinal cord at the C4-C5 and C6-C7 levels. IMPRESSION: Overall interval progression of findings consistent with  discitis/osteomyelitis as detailed and now involving the C4, C5, C6 and C7 vertebrae. Additionally, there is involvement of the bilateral C2-C3 facet joints, right C3-C4 facet joint as well as left C6-C7 facet joint likely reflecting septic arthritis. Persistent ventral greater than dorsal epidural phlegmon now spanning the C2-C7 levels. There is persistent although decreased spinal canal narrowing at the C4-C7 levels with epidural phlegmon contributing. Persistent prevertebral/retropharyngeal phlegmon. No definite residual prevertebral abscess is identified, although susceptibility artifact limits evaluation at the C5-C6 level. Phlegmon extends into the bilateral neural foramina at multiple levels. Electronically Signed   By: Jackey Loge DO   On: 11/01/2019 15:42   MR THORACIC SPINE W WO CONTRAST  Result Date: 11/01/2019 CLINICAL DATA:  Mid back pain, greater than 4 weeks or red flag, no prior imaging. EXAM: MRI THORACIC WITHOUT AND WITH CONTRAST TECHNIQUE: Multiplanar and multiecho pulse sequences of the thoracic spine were obtained without and with intravenous contrast. CONTRAST:  60mL GADAVIST GADOBUTROL 1 MMOL/ML IV SOLN COMPARISON:  MRI thoracic spine 09/29/2019 FINDINGS: MRI THORACIC SPINE FINDINGS Alignment: Normal. Vertebrae: No evidence discitis/osteomyelitis within the thoracic region. Redemonstrated probable incidental hemangioma within the right T4 pedicle. Cord:  No spinal cord signal abnormality. Paraspinal and other soft tissues: Bilateral ventral paravertebral phlegmon extends caudally to the T2 level (left greater than right) (series 4, image 14) (series 6, image 4). No definite formed abscess is identified. Previously demonstrated low level epidural inflammation extending to the midthoracic region, particularly posteriorly, is again seen although less conspicuous as compared to prior examination. Disc levels: Redemonstrated small T8-T9 central disc protrusion. No compressive stenosis. No  significant disc herniation, spinal canal stenosis or neural foraminal narrowing at the remaining levels. IMPRESSION: No evidence of discitis/osteomyelitis within the thoracic spine. Ventral paravertebral phlegmon extends caudally to the T2 level (left greater than right). No definite formed abscess is identified. Previously demonstrated low-level epidural inflammation extending to the midthoracic region, particularly posteriorly is again seen although less conspicuous as compared to prior examination. Electronically Signed   By: Jackey Loge DO   On: 11/01/2019 15:58   DG Chest Port 1 View  Result Date: 11/02/2019 CLINICAL DATA:  Fever.  Infection EXAM: PORTABLE CHEST 1 VIEW COMPARISON:  09/29/2019 FINDINGS: Normal heart size and mediastinal contours. No acute infiltrate or edema. Nodular densities attributed to nipple shadows. No effusion or pneumothorax. No acute osseous findings. IMPRESSION: No active disease. Electronically Signed   By: Marnee Spring M.D.   On: 11/02/2019 07:40      Assessment/Plan:  INTERVAL HISTORY:   We last saw her she has had increasing neck pain and  fevers.  MRI done yesterday shows pression of discitis and osteomyelitis involving C4-5-6 and 7 vertebral bodies as well as bilateral C2 and C3 facet joints right C3-C4 facet joint and left C6-7, along with persistent epidural phlegmon from C2-C7     Principal Problem:   Abscess in epidural space of cervical spine Active Problems:   Depression   Acquired fusion of cervical spine   IVDU (intravenous drug user)   Homeless   S/P cervical discectomy   Anxiety   Status post gastric bypass for obesity   Hepatitis C antibody positive in blood    Colleen Ramirez is a 40 y.o. 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  She now has had worsening of C spine pathology DESPITE surgery and aggressive antibiotics with  vancomycin and rifampin. Vancomycin AUC have been appropriate.  #1 Cervical spine MRIs discitis epidural abscess status post surgery and fusion now with worsening of pathology on imaging neck pain and fever  Dr Maurice Small who will see the patient later today.  From an antibiotic standpoint she is on aggressive and appropriate antibiotics  We WILL need to reset clock on her 6-8 weeks of parenteral antibiotics and then follow that with months of po anbiotics  #2 Right arm swelling: agree with dopplers   LOS: 34 days   Acey Lav 11/02/2019, 11:35 AM

## 2019-11-02 NOTE — Progress Notes (Signed)
Pt seen and evaluated - strength 5/5x4, SILTx4. Still having significant neck pain, both a constant and activity-dependent component. From her description, it sounds like it's worst when she extends her neck. Soft collar helps but doesn't resolve it. No subjective focal weakness, no new other new neurologic complaints.  Repeat MRI reviewed, her cord compression is significantly better than preop, so I do not think she needs any further surgical intervention. Unfortunately, I do agree that she has new T2 changes in the C2-3/C3-4/C6-7 facets, which in this context are most likely c/w septic arthritis as well as interspinous ligaments at C5-6/C6-7. There is still a lot of prevertebral / longus involvement, which is not suprising.   Given the facet involvement, I advised her that we could try switching to a rigid cervical collar to restrict more motion, which may make her more comfortable, but usually hard collar compliance is low because they're so uncomfortable. I recommended her to continue with the soft collar, let nursing know if she'd like to try a hard collar.  I discussed with the patient at length. She asked about repeat surgical intervention, which I do not recommend. I explained her MRI findings in detail and answered her questions. I emphasized that she has numerous infected joints in her spine and, just like any other joint with an infection, it's unfortunately going to be painful until the infection clears. I explained to her that, as we discussed before surgery, the goal of surgery was to drain the abscess and decompress her spinal cord to prevent her from losing neurologic function, which was thankfully successful. During our conversation, she frequently perseverated about the specific doses of opiates she was getting, which I reiterated was up to her primary service. I also reiterated that I agree with all of the other physicians in her care team that she needs to get off of opiates and that IV  drug use is what has caused this infection and the infection is what is causing her to have neck pain.

## 2019-11-02 NOTE — Progress Notes (Signed)
PROGRESS NOTE   Colleen Ramirez  VFI:[id number]    DOB: [birth year]    DOA: 09/29/2019  PCP: Patient, No Pcp Per   I have briefly reviewed patients previous medical records in [facility].  Chief Complaint:   Chief Complaint  Patient presents with  . Generalized Body Aches    Brief Narrative:  39 year old female with PMH of IVDA (methamphetamine), anemia, obesity s/p gastric bypass (Roux-en-Y), homeless, presented with 2 days history of severe sharp constant neck pain.  She was recently hospitalized and discharged on 08/25/2019 for facial cellulitis and completed a 10-day course of oral doxycycline and topical clindamycin. She underwent ACDF on 09/29/2019 for MRSA epidural abscess and is on IV vancomycin and oral rifampin through 11/09/2019 and will remain inpatient for the duration of her IV therapy due to her history of IVDA.  Hospital course complicated by subjective pain issues.    Course complicated since 2/14 night worsening neck pain, new onset high fevers.  Neurosurgery and ID consulted.  No surgical intervention planned.  ID continued same antibiotics but reset her clock on her parenteral antibiotics and hence will have to remain here longer.  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  S/p C5-6 ACDF by Neurosurgery on 09/29/2019.  Cultures grew MRSA.  Dr. Johnsie Cancel followed up on 2/8: No neurologic/radicular etiology for her RUE/proximal shoulder pain.  He stated that she had a severe infection of multiple levels of her spine that infiltrated all of her paraspinal/prevertebral musculature and she is expected to have pain from the  inflammation/infection and also agreed with getting her off of narcotic pain medications.  Continue IV vancomycin and oral rifampin per pharmacy through 11/09/2019 followed by doxycycline for a month.  Please see discussion below regarding pain control.  Course complicated since 2/14 night with worsening neck pain, new onset high fevers.  Neurosurgery and ID consulted and I discussed with both.  No surgical intervention planned.  ID continued same antibiotics but reset her clock on her parenteral antibiotics and hence will have to remain here longer.?  End date.  Pain control: This has been a difficult issue.  She obviously has reasons to have acute pain as noted above.  At the same time, it is hard to know if there is any secondary gain given history of substance abuse.  IV Dilaudid discontinued 2/8.  Unable to use NSAIDs due to history of gastric bypass/Roux-en-Y.  Continue Neurontin 100 mg 3 times daily and consider increasing the dose, topical Voltaren gel, K pad, oxycodone weaned down from 20mg  to 15 mg every 3 hours as needed prior to my taking over her care 5 days ago.  Added scheduled Tylenol 1 g 3 times daily which seems to be helping.  Reportedly followed with pain management in [place] years ago.  Patient has been reluctant to wean opioids since I took over her care.  Due to new onset acute pain issues, added couple of doses of IV Dilaudid for severe pain.  Reassess on daily basis.  Left upper extremity superficial vein thrombosis in cephalic and basilic veins: As per left upper extremity venous  Doppler preliminary report by vascular tech.  Likely provoked by previous IV midline at that site.  Unable to use NSAIDs due to gastric bypass history.  Continue other supportive treatment: Elevate limb, heat/cold as preferred, topical Voltaren.  Reports that this feels better today.  Right upper extremity DVT: On 2/15, she reported right upper extremity pain and swelling at site of midline.  Midline  removed.  Vascular tech reported preliminarily that patient had "DVT in right brachial and cephalic veins and that the brachial looked more chronic".  Initiated full dose Lovenox in case any procedures are planned, this can be held right away.  Patient agreeable.  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  This needs to be clarified with the vascular surgeon in the morning 2/16 and need to make final decision regarding need for continued anticoagulation or not.  Depression/anxiety/ADD: Continue Adderall, Abilify and Valium.  Stable.  Homelessness: Difficult situation.  Social work on Theatre manager.  Tobacco abuse: Wean nicotine patch down to 7 mg patch.  S/p Roux-en-Y for obesity: Years ago while in [place].  Hepatitis C antibody positive: Outpatient follow-up with ID.  Anemia: Suspect chronic disease.  Stable.  Periodically follow.   Body mass index is 19.49 kg/m.  Nutritional Status Nutrition Problem: Increased nutrient needs Etiology: post-op healing Signs/Symptoms: estimated needs Interventions: Ensure Enlive (each supplement provides 350kcal and 20 grams of protein), MVI  DVT prophylaxis: Lovenox Code Status: Full Family Communication: None at bedside Disposition:  . Patient came from: Homeless           . Anticipated d/c place: To be determined.  She is homeless and Child psychotherapist is aware.  She will likely go to a homeless shelter. . Barriers to d/c: Homeless and need for continued IV antibiotics-now duration has been extended by ID?  End date.  Unfortunately she will have to stay in the hospital longer.   Consultants:   Infectious disease Neurosurgery  Procedures:   ACDF Midline  Antimicrobials:   As noted above   Subjective:  Overnight events noted.  Fever.  Worsening neck pain, right  upper extremity pain and swelling.  Interviewed and examined patient along with ID team.  Objective:   Vitals:   11/02/19 0750 11/02/19 1158 11/02/19 1607 11/02/19 1655  BP: 111/75 113/79 (!) 132/92 130/78  Pulse: (!) 120 (!) 113 (!) 123 (!) 122  Resp: 17 17 18 17   Temp: 99 F (37.2 C) 97.8 F (36.6 C) 98.8 F (37.1 C) 99.4 F (37.4 C)  TempSrc: Oral Oral Oral Oral  SpO2: 100% 100% 100% 99%  Weight:      Height:        General exam: Young female, moderately built and nourished, laying in bed, appears more anxious than in any distress regarding her worsening condition.  Did not appear in overt pain. Respiratory system: Clear to auscultation.  No increased work of breathing. CVS: S1 and S2 heard, regular mild tachycardia.  No JVD, murmurs or pedal edema. CNS: Alert and oriented.  No cranial nerve deficits. Extremities: Left upper arm with less tenderness.  No other acute findings appreciated.  Power in extremities seems normal.  However has some restricted elevation at right shoulder due to neck pain.  Left upper arm appears mildly swollen, tender with minimal increase in warmth but no features of thrombophlebitis. Neck: Anterior  ACDF scar has healed well.  Right cervical paraspinal muscles with some stiffness/spasm but otherwise nontender and no acute findings appreciated. Psychiatry: Judgement and insight appear normal. Mood & affect anxious appearing    Data Reviewed:   I have personally reviewed following labs and imaging studies   CBC: Recent Labs  Lab 10/28/19 0609 11/02/19 1007  WBC 7.0 7.7  NEUTROABS 4.3 5.0  HGB 11.2* 10.6*  HCT 34.8* 32.4*  MCV 88.3 86.4  PLT 309 301    Basic Metabolic Panel: Recent Labs  Lab 10/27/19 0518 10/31/19 0324 11/02/19 1007  NA 139 136 136  K 4.0 3.9 3.7  CL 102 102 103  CO2 27 24 23   GLUCOSE 89 111* 77  BUN 6 8 6   CREATININE 0.52 0.70 0.51  CALCIUM 8.8* 9.0 8.5*    Liver Function Tests: Recent Labs  Lab  10/31/19 0324 11/02/19 1007  AST 25 25  ALT 24 24  ALKPHOS 78 77  BILITOT 0.7 0.5  PROT 7.4 6.3*  ALBUMIN 3.0* 2.5*    CBG: No results for input(s): GLUCAP in the last 168 hours.  Microbiology Studies:  No results found for this or any previous visit (from the past 240 hour(s)).   Radiology Studies:  DG Chest Port 1 View  Result Date: 11/02/2019 CLINICAL DATA:  Fever.  Infection EXAM: PORTABLE CHEST 1 VIEW COMPARISON:  09/29/2019 FINDINGS: Normal heart size and mediastinal contours. No acute infiltrate or edema. Nodular densities attributed to nipple shadows. No effusion or pneumothorax. No acute osseous findings. IMPRESSION: No active disease. Electronically Signed   By: 11/04/2019 M.D.   On: 11/02/2019 07:40   VAS [facility] [place] UPPER EXTREMITY VENOUS DUPLEX  Result Date: 11/02/2019 UPPER VENOUS STUDY  Indications: Pain, and Swelling Limitations: Bandages. Comparison Study: Left upper extremity venous duplex exam 10-31-19, positive. Performing Technologist: 11/04/2019, RVT  Examination Guidelines: A complete evaluation includes B-mode imaging, spectral Doppler, color Doppler, and power Doppler as needed of all accessible portions of each vessel. Bilateral testing is considered an integral part of a complete examination. Limited examinations for reoccurring indications may be performed as noted.  Right Findings: +----------+------------+---------+-----------+----------+-------+ RIGHT     CompressiblePhasicitySpontaneousPropertiesSummary +----------+------------+---------+-----------+----------+-------+ IJV           Full       Yes       Yes                      +----------+------------+---------+-----------+----------+-------+ Subclavian    Full       Yes       Yes                      +----------+------------+---------+-----------+----------+-------+ Axillary      Full       Yes       Yes                       +----------+------------+---------+-----------+----------+-------+ Brachial      None       No        No      dilated  Chronic +----------+------------+---------+-----------+----------+-------+ Radial        Full                                          +----------+------------+---------+-----------+----------+-------+ Ulnar  Full                                          +----------+------------+---------+-----------+----------+-------+ Cephalic      None       No        No               Chronic +----------+------------+---------+-----------+----------+-------+ Basilic       Full                                          +----------+------------+---------+-----------+----------+-------+  Summary:  Right: Findings consistent with acute superficial vein thrombosis involving the right cephalic vein. Findings consistent with chronic deep vein thrombosis involving the right brachial veins.  *See table(s) above for measurements and observations.  Diagnosing physician: Ruta Hinds MD Electronically signed by Ruta Hinds MD on 11/02/2019 at 5:20:26 PM.    Final    MRI cervical spine with and without contrast 2/14   IMPRESSION: Overall interval progression of findings consistent with discitis/osteomyelitis as detailed and now involving the C4, C5, C6 and C7 vertebrae. Additionally, there is involvement of the bilateral C2-C3 facet joints, right C3-C4 facet joint as well as left C6-C7 facet joint likely reflecting septic arthritis. Persistent ventral greater than dorsal epidural phlegmon now spanning the C2-C7 levels. There is persistent although decreased spinal canal narrowing at the C4-C7 levels with epidural phlegmon contributing. Persistent prevertebral/retropharyngeal phlegmon. No definite residual prevertebral abscess is identified, although susceptibility artifact limits evaluation at the C5-C6 level. Phlegmon extends into the bilateral neural foramina at  multiple Levels.  MRI thoracic spine with and without contrast 2/14:  IMPRESSION: No evidence of discitis/osteomyelitis within the thoracic spine.  Ventral paravertebral phlegmon extends caudally to the T2 level (left greater than right). No definite formed abscess is identified.  Previously demonstrated low-level epidural inflammation extending to the midthoracic region, particularly posteriorly is again seen although less conspicuous as compared to prior examination.  Scheduled Meds:   . acetaminophen  1,000 mg Oral TID  . amphetamine-dextroamphetamine  30 mg Oral BID  . ARIPiprazole  20 mg Oral Daily  . buPROPion  150 mg Oral Daily  . Chlorhexidine Gluconate Cloth  6 each Topical Q0600  . diclofenac Sodium  4 g Topical QID  . enoxaparin (LOVENOX) injection  50 mg Subcutaneous Q12H  . feeding supplement (ENSURE ENLIVE)  237 mL Oral BID BM  . gabapentin  100 mg Oral TID  . multivitamin with minerals  1 tablet Oral Daily  . nicotine  14 mg Transdermal Daily  . rifampin  300 mg Oral Q12H  . senna-docusate  1 tablet Oral BID  . sodium chloride flush  10-40 mL Intracatheter Q12H  . sodium chloride flush  3 mL Intravenous Q12H    Continuous Infusions:   . sodium chloride Stopped (10/24/19 1012)  . sodium chloride 250 mL (11/02/19 0043)  . vancomycin HCl 1,500 mg (11/02/19 1238)     LOS: 34 days     Vernell Leep, MD, [place], [facility]. [facility]    To contact the attending provider between 7A-7P or the covering provider during after hours 7P-7A, please log into the web site [email or web address] and access using universal [place] password for that web site. If you do not have the password, please call the  hospital operator.  11/02/2019, 7:05 PM

## 2019-11-02 NOTE — Progress Notes (Signed)
Spoke to Dr. Waymon Amato, patient is aware that she will only have 2 doses of Dilaudid IV.  She will not get any additional IV pain medication.  Okay to use patient's left arm for BP and blood draws.

## 2019-11-02 NOTE — Progress Notes (Signed)
Pharmacy Antibiotic Note  Colleen Ramirez is a 39 y.o. female admitted on 09/29/2019 with MRSA cervical spine infection. Pharmacy has been consulted for Vancomycin dosing. Also on rifampin. Noted that patient had a fever of 103 on 2/14 with some increased pain in her neck. Repeat MRI of her cervical spine showed some spread of her discitis into her thoracic spine.   Renal function remains stable and last Vancomycin levels collected on 2/12 were therapeutic. Blood cultures have been ordered due to fever last night and ID will plan on extending patient length of therapy with IV antibiotics.    Plan: - Continue vanc 1500mg  IV Q12H - Rifampin 300mg  PO BID - Monitor renal fxn, clinical progress - Will plan on repeat levels 2/18   Height: 5\' 3"  (160 cm) Weight: 110 lb (49.9 kg) IBW/kg (Calculated) : 52.4  Temp (24hrs), Avg:99.8 F (37.7 C), Min:98.3 F (36.8 C), Max:103 F (39.4 C)  Recent Labs  Lab 10/27/19 0518 10/27/19 1509 10/27/19 2208 10/28/19 0609 10/30/19 1625 10/30/19 2113 10/31/19 0324  WBC  --   --   --  7.0  --   --   --   CREATININE 0.52  --   --   --   --   --  0.70  VANCOTROUGH  --   --  12*  --   --  16  --   VANCOPEAK  --  20*  --   --  22*  --   --     Estimated Creatinine Clearance: 74.4 mL/min (by C-G formula based on SCr of 0.7 mg/dL).    No Known Allergies  Antimicrobials this admission: Zosyn 1/12 >> 1/13 Vanc 1/12 >>  Rifampin 1/14 >>   Dose adjustments this admission: 1/16 & 1/17: 21 and <4 - 1g x 1 and adj to 750 mg/8h 1/19: VP 32, VT 11, AUC 521 - no change 1/22 VP/VT 29/14 AUC 542 - adjusted to 1000mg  q12h 1/26: VP/VT 24/9 AUC 420 - adjusted to 1250 mg q12h 2/2: VP 26, Vt11, AUC 480, Continue 1250mg  IV q12h 2/9: VP 20, VT 12 AUC 425 - adjusted to 1500mg  IV q12h 2/12: VP 22 (drawn late), VT 16, AUC 500 >> con't 1500mg  q12  Microbiology results: 1/12 abscess cx - MRSA  2/26, PharmD, BCPS, BCIDP Infectious Diseases Clinical  Pharmacist Phone: [phone] 11/02/2019, 10:55 AM

## 2019-11-02 NOTE — Progress Notes (Signed)
ANTICOAGULATION CONSULT NOTE - initial Consult  Pharmacy Consult for Lovenox Indication: DVT, RUE  No Known Allergies  Patient Measurements: Height: 5\' 3"  (160 cm) Weight: 110 lb (49.9 kg) IBW/kg (Calculated) : 52.4 Lovenox Dosing Weight: 49.9 kg  Vital Signs: Temp: 97.8 F (36.6 C) (02/15 1158) Temp Source: Oral (02/15 1158) BP: 113/79 (02/15 1158) Pulse Rate: 113 (02/15 1158)  Labs: Recent Labs    10/31/19 0324 11/02/19 1007  HGB  --  10.6*  HCT  --  32.4*  PLT  --  301  CREATININE 0.70 0.51    Estimated Creatinine Clearance: 74.4 mL/min (by C-G formula based on SCr of 0.51 mg/dL).  Assessment:   39 yr old female know to Rx from Vancomycin dosing.    New DVT in RUE per duplex.  Has been refusing Lovenox 40 mg sq daily for prophylaxis.  Now to increase to treatment-dose Lovenox.   Discussed briefly with patient.  She is agreeable to Lovenox for now.     Goal of Therapy:  Anti-Xa level 0.6-1 units/ml 4hrs after LMWH dose given Monitor platelets by anticoagulation protocol: Yes   Plan:   Lovenox 50 mg SQ q12hrs.  CBC at least q 3days while on Lovenox.  Follow up oral anticoagulation plans when able.  24, Dennie Fetters Phone: [phone] 11/02/2019,2:22 PM

## 2019-11-03 ENCOUNTER — Inpatient Hospital Stay (HOSPITAL_COMMUNITY): Payer: Self-pay

## 2019-11-03 DIAGNOSIS — R52 Pain, unspecified: Secondary | ICD-10-CM

## 2019-11-03 DIAGNOSIS — M25511 Pain in right shoulder: Secondary | ICD-10-CM

## 2019-11-03 DIAGNOSIS — M546 Pain in thoracic spine: Secondary | ICD-10-CM

## 2019-11-03 DIAGNOSIS — R079 Chest pain, unspecified: Secondary | ICD-10-CM

## 2019-11-03 DIAGNOSIS — F1510 Other stimulant abuse, uncomplicated: Secondary | ICD-10-CM

## 2019-11-03 DIAGNOSIS — I82621 Acute embolism and thrombosis of deep veins of right upper extremity: Secondary | ICD-10-CM

## 2019-11-03 MED ORDER — IOHEXOL 300 MG/ML  SOLN
75.0000 mL | Freq: Once | INTRAMUSCULAR | Status: AC | PRN
  Administered 2019-11-03: 20:00:00 75 mL via INTRAVENOUS

## 2019-11-03 MED ORDER — POLYETHYLENE GLYCOL 3350 17 G PO PACK: 17.0000 g | PACK | Freq: Every day | ORAL | Status: DC | PRN

## 2019-11-03 MED ORDER — SENNOSIDES-DOCUSATE SODIUM 8.6-50 MG PO TABS
2.0000 | ORAL_TABLET | Freq: Every evening | ORAL | Status: DC | PRN
  Filled 2019-11-03: qty 2

## 2019-11-03 MED ORDER — HYDROMORPHONE HCL 2 MG PO TABS
2.0000 mg | ORAL_TABLET | ORAL | Status: DC | PRN
  Administered 2019-11-03 – 2019-11-04 (×5): 2 mg via ORAL
  Filled 2019-11-03 (×5): qty 1

## 2019-11-03 NOTE — Progress Notes (Signed)
Subjective:  Complaining of having here pains adequately assessed including chest pain that she had last night.  She was on the phone with her mother through a large part of her conversation  Is talking about wanting to go to an outside facility closer to home where her mother lives in [place]  Antibiotics:  Anti-infectives (From admission, onward)   Start     Dose/Rate Route Frequency Ordered Stop   11/01/19 1400  vancomycin (VANCOREADY) IVPB 1500 mg/300 mL     1,500 mg 150 mL/hr over 120 Minutes Intravenous Every 12 hours 11/01/19 1305     10/28/19 1000  vancomycin (VANCOREADY) IVPB 1500 mg/300 mL  Status:  Discontinued     1,500 mg 150 mL/hr over 120 Minutes Intravenous Every 12 hours 10/27/19 2353 11/01/19 1305   10/14/19 1000  vancomycin (VANCOREADY) IVPB 1250 mg/250 mL  Status:  Discontinued     1,250 mg 166.7 mL/hr over 90 Minutes Intravenous Every 12 hours 10/14/19 0540 10/27/19 2353   10/13/19 0945  Vancomycin (VANCOCIN) 1,250 mg in sodium chloride 0.9 % 250 mL IVPB  Status:  Discontinued     1,250 mg 166.7 mL/hr over 90 Minutes Intravenous Every 12 hours 10/13/19 0935 10/14/19 0540   10/13/19 0930  Vancomycin (VANCOCIN) 1,250 mg in sodium chloride 0.9 % 250 mL IVPB  Status:  Discontinued     1,250 mg 166.7 mL/hr over 90 Minutes Intravenous Every 12 hours 10/13/19 0902 10/13/19 0934   10/09/19 2000  vancomycin (VANCOCIN) IVPB 1000 mg/200 mL premix  Status:  Discontinued     1,000 mg 200 mL/hr over 60 Minutes Intravenous Every 12 hours 10/09/19 1121 10/13/19 0902   10/04/19 2200  vancomycin (VANCOCIN) IVPB 750 mg/150 ml premix  Status:  Discontinued     750 mg 150 mL/hr over 60 Minutes Intravenous Every 8 hours 10/04/19 1305 10/09/19 1121   10/04/19 1400  vancomycin (VANCOCIN) IVPB 1000 mg/200 mL premix     1,000 mg 200 mL/hr over 60 Minutes Intravenous STAT 10/04/19 1305 10/04/19 1604   10/01/19 1100  rifampin (RIFADIN) capsule 300 mg     300 mg Oral  Every 12 hours 10/01/19 1056     09/30/19 1800  Vancomycin (VANCOCIN) 1,250 mg in sodium chloride 0.9 % 250 mL IVPB  Status:  Discontinued     1,250 mg 166.7 mL/hr over 90 Minutes Intravenous Every 24 hours 09/30/19 0003 09/30/19 0747   09/30/19 1800  vancomycin (VANCOREADY) IVPB 1250 mg/250 mL  Status:  Discontinued     1,250 mg 166.7 mL/hr over 90 Minutes Intravenous Every 24 hours 09/30/19 0746 10/04/19 1305   09/30/19 0400  piperacillin-tazobactam (ZOSYN) IVPB 3.375 g  Status:  Discontinued     3.375 g 12.5 mL/hr over 240 Minutes Intravenous Every 8 hours 09/30/19 0003 09/30/19 1533   09/29/19 2145  piperacillin-tazobactam (ZOSYN) IVPB 3.375 g     3.375 g 12.5 mL/hr over 240 Minutes Intravenous STAT 09/29/19 2137 09/29/19 2148   09/29/19 2119  bacitracin 50,000 Units in sodium chloride 0.9 % 500 mL irrigation  Status:  Discontinued       As needed 09/29/19 2119 09/29/19 2249   09/29/19 2033  vancomycin (VANCOCIN) 1-5 GM/200ML-% IVPB    Note to Pharmacy: Toney Sang   : cabinet override      09/29/19 2033 09/30/19 0844      Medications: Scheduled Meds: . acetaminophen  1,000 mg Oral TID  . amphetamine-dextroamphetamine  30  mg Oral BID  . ARIPiprazole  20 mg Oral Daily  . buPROPion  150 mg Oral Daily  . Chlorhexidine Gluconate Cloth  6 each Topical Q0600  . diclofenac Sodium  4 g Topical QID  . enoxaparin (LOVENOX) injection  50 mg Subcutaneous Q12H  . feeding supplement (ENSURE ENLIVE)  237 mL Oral BID BM  . gabapentin  100 mg Oral TID  . multivitamin with minerals  1 tablet Oral Daily  . nicotine  14 mg Transdermal Daily  . rifampin  300 mg Oral Q12H  . senna-docusate  1 tablet Oral BID  . sodium chloride flush  10-40 mL Intracatheter Q12H  . sodium chloride flush  3 mL Intravenous Q12H   Continuous Infusions: . sodium chloride Stopped (10/24/19 1012)  . sodium chloride 250 mL (11/02/19 0043)  . vancomycin HCl 1,500 mg (11/03/19 0314)   PRN Meds:.sodium chloride,  cyclobenzaprine, diazepam, gadobutrol, HYDROmorphone, ondansetron **OR** ondansetron (ZOFRAN) IV, oxymetazoline, polyethylene glycol, senna-docusate, sodium chloride, sodium chloride flush, sodium chloride flush, traZODone    Objective: Weight change:   Intake/Output Summary (Last 24 hours) at 11/03/2019 1233 Last data filed at 11/03/2019 0700 Gross per 24 hour  Intake 360 ml  Output --  Net 360 ml   Blood pressure (!) 119/91, pulse 63, temperature 98.5 F (36.9 C), temperature source Oral, resp. rate 18, height 5\' 3"  (1.6 m), weight 49.9 kg, SpO2 (!) 79 %. Temp:  [97.7 F (36.5 C)-99.4 F (37.4 C)] 98.5 F (36.9 C) (02/16 0752) Pulse Rate:  [63-123] 63 (02/16 0752) Resp:  [15-18] 18 (02/16 0752) BP: (108-155)/(72-99) 119/91 (02/16 0752) SpO2:  [79 %-100 %] 79 % (02/16 0752)  Physical Exam: General: Alert and awake, oriented x3, not in any acute distress. HEENT: anicteric sclera, EOMI CVS regular rate, normal  Chest: , no wheezing, no respiratory distress, she has some tenderness above the sternoclavicular joint though it is not especially inflamed Abdomen: soft non-distended,  Extremities: no edema or deformity noted bilaterally Skin: no rashes Neuro: nonfocal  CBC:    BMET Recent Labs    11/02/19 1007  NA 136  K 3.7  CL 103  CO2 23  GLUCOSE 77  BUN 6  CREATININE 0.51  CALCIUM 8.5*     Liver Panel  Recent Labs    11/02/19 1007  PROT 6.3*  ALBUMIN 2.5*  AST 25  ALT 24  ALKPHOS 77  BILITOT 0.5       Sedimentation Rate Recent Labs    11/01/19 1125  ESRSEDRATE 57*   C-Reactive Protein Recent Labs    11/01/19 1125  CRP 17.8*    Micro Results: Recent Results (from the past 720 hour(s))  Culture, blood (Routine X 2) w Reflex to ID Panel     Status: None (Preliminary result)   Collection Time: 11/02/19 10:25 AM   Specimen: BLOOD  Result Value Ref Range Status   Specimen Description BLOOD LEFT ANTECUBITAL  Final   Special Requests    Final    BOTTLES DRAWN AEROBIC ONLY Blood Culture results may not be optimal due to an inadequate volume of blood received in culture bottles   Culture   Final    NO GROWTH < 24 HOURS Performed at [facility], [place] [address], [address], [place] [address]    Report Status PENDING  Incomplete  Culture, blood (Routine X 2) w Reflex to ID Panel     Status: None (Preliminary result)   Collection Time: 11/02/19 10:29 AM  Specimen: BLOOD  Result Value Ref Range Status   Specimen Description BLOOD LEFT ANTECUBITAL  Final   Special Requests   Final    BOTTLES DRAWN AEROBIC ONLY Blood Culture results may not be optimal due to an inadequate volume of blood received in culture bottles   Culture   Final    NO GROWTH < 24 HOURS Performed at [facility], [address], [place], [place] [address]    Report Status PENDING  Incomplete    Studies/Results: MR CERVICAL SPINE W WO CONTRAST  Result Date: 11/01/2019 CLINICAL DATA:  Neck pain, infection or inflammation suspected. Increasing neck pain. Additional history obtained from electronic medical record, status post C5-C6 ACDF 09/29/2019. EXAM: MRI CERVICAL SPINE WITHOUT AND WITH CONTRAST TECHNIQUE: Multiplanar and multiecho pulse sequences of the cervical spine, to include the craniocervical junction and cervicothoracic junction, were obtained without and with intravenous contrast. CONTRAST:  22mL GADAVIST GADOBUTROL 1 MMOL/ML IV SOLN COMPARISON:  MRI cervical and thoracic spine 09/29/2019 FINDINGS: Alignment: Reversal of the expected cervical lordosis centered at C5-C6. Vertebrae: Interval regression of imaging features of discitis/osteomyelitis with abnormal T1 hypointense and T2/STIR hyperintense signal abnormality as well as abnormal enhancement having progressed within the C4, C5, C6 and C7 vertebrae. Additionally, there is signal abnormality and abnormal enhancement as well as osseous erosion involving the bilateral C2-C3 facet joints and  right C3-C4 facet joint likely reflecting septic arthritis. There is also abnormal edema and enhancement involving the left C6-C7 facet joint. Abnormal enhancing phlegmon extends into the neural foramina bilaterally at multiple levels. Cord: No spinal cord signal abnormality or abnormal enhancement. Posterior Fossa, vertebral arteries, paraspinal tissues: No abnormality identified within included portions of the posterior fossa. Persistent ventral epidural phlegmon extending from the C3 level caudally to the C7 level. Ventral greater than dorsal epidural phlegmon is greatest at the C4-C7 levels measuring up to 4 mm in thickness. There is new signal abnormality and abnormal enhancement within the C5-C6 and C6-C7 interspinous spaces consistent with soft tissue infection. There is persistent prevertebral/retropharyngeal phlegmon spanning the C2-C7 levels measuring up to 10 mm in thickness. No prevertebral/retropharyngeal abscess is identified, although susceptibility artifact limits evaluation of the C5-C6 levels. Persistent myositis of the longus coli musculature. There is also edema within the parapharyngeal spaces bilaterally. Disc levels: As compared to prior examination, persistent although decreased spinal canal stenosis with persistent contact upon the ventral spinal cord at the C4-C5 and C6-C7 levels. IMPRESSION: Overall interval progression of findings consistent with discitis/osteomyelitis as detailed and now involving the C4, C5, C6 and C7 vertebrae. Additionally, there is involvement of the bilateral C2-C3 facet joints, right C3-C4 facet joint as well as left C6-C7 facet joint likely reflecting septic arthritis. Persistent ventral greater than dorsal epidural phlegmon now spanning the C2-C7 levels. There is persistent although decreased spinal canal narrowing at the C4-C7 levels with epidural phlegmon contributing. Persistent prevertebral/retropharyngeal phlegmon. No definite residual prevertebral abscess  is identified, although susceptibility artifact limits evaluation at the C5-C6 level. Phlegmon extends into the bilateral neural foramina at multiple levels. Electronically Signed   By: Jackey Loge DO   On: 11/01/2019 15:42   MR THORACIC SPINE W WO CONTRAST  Result Date: 11/01/2019 CLINICAL DATA:  Mid back pain, greater than 4 weeks or red flag, no prior imaging. EXAM: MRI THORACIC WITHOUT AND WITH CONTRAST TECHNIQUE: Multiplanar and multiecho pulse sequences of the thoracic spine were obtained without and with intravenous contrast. CONTRAST:  99mL GADAVIST GADOBUTROL 1 MMOL/ML IV SOLN COMPARISON:  MRI thoracic spine 09/29/2019 FINDINGS: MRI THORACIC SPINE FINDINGS Alignment: Normal. Vertebrae: No evidence discitis/osteomyelitis within the thoracic region. Redemonstrated probable incidental hemangioma within the right T4 pedicle. Cord:  No spinal cord signal abnormality. Paraspinal and other soft tissues: Bilateral ventral paravertebral phlegmon extends caudally to the T2 level (left greater than right) (series 4, image 14) (series 6, image 4). No definite formed abscess is identified. Previously demonstrated low level epidural inflammation extending to the midthoracic region, particularly posteriorly, is again seen although less conspicuous as compared to prior examination. Disc levels: Redemonstrated small T8-T9 central disc protrusion. No compressive stenosis. No significant disc herniation, spinal canal stenosis or neural foraminal narrowing at the remaining levels. IMPRESSION: No evidence of discitis/osteomyelitis within the thoracic spine. Ventral paravertebral phlegmon extends caudally to the T2 level (left greater than right). No definite formed abscess is identified. Previously demonstrated low-level epidural inflammation extending to the midthoracic region, particularly posteriorly is again seen although less conspicuous as compared to prior examination. Electronically Signed   By: Jackey Loge DO    On: 11/01/2019 15:58   DG Chest Port 1 View  Result Date: 11/02/2019 CLINICAL DATA:  Fever.  Infection EXAM: PORTABLE CHEST 1 VIEW COMPARISON:  09/29/2019 FINDINGS: Normal heart size and mediastinal contours. No acute infiltrate or edema. Nodular densities attributed to nipple shadows. No effusion or pneumothorax. No acute osseous findings. IMPRESSION: No active disease. Electronically Signed   By: Marnee Spring M.D.   On: 11/02/2019 07:40   VAS [place] UPPER EXTREMITY VENOUS DUPLEX  Result Date: 11/02/2019 UPPER VENOUS STUDY  Indications: Pain, and Swelling Limitations: Bandages. Comparison Study: Left upper extremity venous duplex exam 10-31-19, positive. Performing Technologist: Miles Costain, RVT  Examination Guidelines: A complete evaluation includes B-mode imaging, spectral Doppler, color Doppler, and power Doppler as needed of all accessible portions of each vessel. Bilateral testing is considered an integral part of a complete examination. Limited examinations for reoccurring indications may be performed as noted.  Right Findings: +----------+------------+---------+-----------+----------+-------+ RIGHT     CompressiblePhasicitySpontaneousPropertiesSummary +----------+------------+---------+-----------+----------+-------+ IJV           Full       Yes       Yes                      +----------+------------+---------+-----------+----------+-------+ Subclavian    Full       Yes       Yes                      +----------+------------+---------+-----------+----------+-------+ Axillary      Full       Yes       Yes                      +----------+------------+---------+-----------+----------+-------+ Brachial      None       No        No      dilated  Chronic +----------+------------+---------+-----------+----------+-------+ Radial        Full                                          +----------+------------+---------+-----------+----------+-------+ Ulnar          Full                                          +----------+------------+---------+-----------+----------+-------+  Cephalic      None       No        No               Chronic +----------+------------+---------+-----------+----------+-------+ Basilic       Full                                          +----------+------------+---------+-----------+----------+-------+  Summary:  Right: Findings consistent with acute superficial vein thrombosis involving the right cephalic vein. Findings consistent with chronic deep vein thrombosis involving the right brachial veins.  *See table(s) above for measurements and observations.  Diagnosing physician: Fabienne Bruns MD Electronically signed by Fabienne Bruns MD on 11/02/2019 at 5:20:26 PM.    Final       Assessment/Plan:  INTERVAL HISTORY:   Pleura shows a DVT in the right upper extremity and she is on full dose Lovenox   Principal Problem:   Abscess in epidural space of cervical spine Active Problems:   Depression   Acquired fusion of cervical spine   IVDU (intravenous drug user)   Homeless   S/P cervical discectomy   Anxiety   Status post gastric bypass for obesity   Hepatitis C antibody positive in blood    Cniyah Fitt is a 39 y.o. 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  She now has had worsening of C spine pathology DESPITE surgery and aggressive antibiotics with vancomycin and rifampin. Vancomycin AUC have been appropriate.  #1 Cervical spine MRIs discitis epidural abscess status post surgery and fusion now with worsening of pathology on imaging neck pain and fever  Dr Maurice Small does not see need for surgical intervention  From an antibiotic standpoint she is on aggressive and appropriate antibiotics  We WILL need to reset clock on her 6-8 weeks of parenteral antibiotics and then follow that with months of  po anbiotics  Start date for new course of antibiotics is date of MRI (Sunday)  IF she goes to an oral anticoagulant would change her rifampin for rifabutin 300 mg daily  #2 right sternoclavicular tenderness: We will get CT of the chest to look for septic sternoclavicular joint  #3 .  Right arm pain she has a DVT in this place     LOS: 35 days   Acey Lav 11/03/2019, 12:33 PM

## 2019-11-03 NOTE — Progress Notes (Signed)
PROGRESS NOTE    Colleen Ramirez  OAC:[id number] DOB: [birth year] DOA: 09/29/2019 PCP: Patient, No Pcp Per   Brief Narrative:  39 year old with history of obesity status post gastric bypass, homelessness, IV drug abuse-methamphetamine, anemia of chronic disease admitted for sharp neck pain.  Previously admitted here couple months ago for facial cellulitis requiring 10 days of doxycycline and topical clindamycin.  She underwent ACDF on 1/12 for MRSA epidural abscess requiring IV vancomycin and oral rifampin through 11/09/2019.  Currently remaining inpatient to complete her treatment course.  Due to swelling of right upper extremity concerns for thrombophlebitis patient was transitioned to Lovenox treatment dosing.   Assessment & Plan:   Principal Problem:   Abscess in epidural space of cervical spine Active Problems:   Depression   Acquired fusion of cervical spine   IVDU (intravenous drug user)   Homeless   S/P cervical discectomy   Anxiety   Status post gastric bypass for obesity   Hepatitis C antibody positive in blood  Cervical epidural abscess, MRSA IV drug abuse, methamphetamine -Status post C5-6 ACDF on 09/29/2019. -Original plan plan to continue vancomycin IV/oral rifampin until 11/09/2019.  Followed by doxycycline for a month.  But now due to recurrent fever, she will need further 6 to 8 weeks of IV antibiotics followed by oral antibiotics. -Seen by ID and neurosurgery  Right upper extremity swelling secondary to superficial vein thrombosis -Supportive care, pain control.  Elevate arm, compression dressing if necessary. -Patient is already been started on Lovenox every 12 hours.  History of chronic pain History of gastric bypass/Roux-en-Y -Continue Neurontin 100 mg 3 times daily -Extensive discussion with the patient and her family member regarding her pain medications, at this point I will discontinue oxycodone and transition her to p.o. Dilaudid.  I have clearly advised her  that she will not be given IV pain medications unless clinically clearly indicated.  In the meantime we can titrate p.o. pain medications as deemed appropriate.  I explained her and the family members that I fear about narcotic overdose subsequently leading to respiratory failure. -Tylenol  History of depression/anxiety -Continue Adderall, Abilify and Valium  Tobacco use -Nicotine patch  Hepatitis C positive antibody -Follow-up with infectious disease  Anemia of chronic disease -Hemoglobin stable.   DVT prophylaxis: Lovenox Code Status: Full Family Communication: Spoke with both the parents Olegario Messier and Dom over the phone on [phone]. Disposition Plan:   Patient From= home  Patient Anticipated D/C place= Home  Barriers= maintain hospital stay at this time for IV antibiotics.  Given homelessness, lack of medical resources support she will need to remain in the hospital until the completion of this.  Consultants:   Neurosurgery  Infectious disease  Procedures:   ACDF 1/12  Antimicrobials:   IV vancomycin  Oral Rocephin   Subjective: As I walked in the room patient was upset and continued to report of requesting to get pain medications and increasing them.  I explained her that there is no clear indication to add any IV narcotics or increase of pain medications.  She tells me that Dilaudid oral medication works better for her therefore I will transition from oxycodone to oral Dilaudid and he will be weaned down as appropriate.  At her request I also spoken with her parents extensively over the phone.  Review of Systems Otherwise negative except as per HPI, including: General: Denies fever, chills, night sweats or unintended weight loss. Resp: Denies cough, wheezing, shortness of breath. Cardiac: Denies chest pain, palpitations, orthopnea, paroxysmal  nocturnal dyspnea. GI: Denies abdominal pain, nausea, vomiting, diarrhea or constipation GU: Denies dysuria, frequency,  hesitancy or incontinence MS: Denies muscle aches, joint pain or swelling Neuro: Denies headache, neurologic deficits (focal weakness, numbness, tingling), abnormal gait Psych: Denies anxiety, depression, SI/HI/AVH Skin: Denies new rashes or lesions ID: Denies sick contacts, exotic exposures, travel  Examination:  General exam: Appears calm and comfortable  Respiratory system: Clear to auscultation. Respiratory effort normal. Cardiovascular system: S1 & S2 heard, RRR. No JVD, murmurs, rubs, gallops or clicks. No pedal edema. Gastrointestinal system: Abdomen is nondistended, soft and nontender. No organomegaly or masses felt. Normal bowel sounds heard. Central nervous system: Alert and oriented. No focal neurological deficits. Extremities: Right upper extremity slightly tender to touch especially in the upper arm area leading down to her forearm.  No obvious evidence of drainage.  Good blood supply and sensation noted no obvious evidence of compartment syndrome at the moment. Skin: No rashes, lesions or ulcers Psychiatry: Judgement and insight appear normal. Mood & affect appropriate.     Objective: Vitals:   11/02/19 2153 11/03/19 0121 11/03/19 0423 11/03/19 0752  BP: (!) 140/92 (!) 155/99 108/72 (!) 119/91  Pulse: (!) 108 (!) 109 97 63  Resp: 18 18 15 18   Temp: 98.9 F (37.2 C) 97.7 F (36.5 C) 98.5 F (36.9 C) 98.5 F (36.9 C)  TempSrc: Oral Oral Oral Oral  SpO2: 100% 98% 100% (!) 79%  Weight:      Height:        Intake/Output Summary (Last 24 hours) at 11/03/2019 0814 Last data filed at 11/02/2019 1100 Gross per 24 hour  Intake 720 ml  Output --  Net 720 ml   Filed Weights   09/29/19 2032  Weight: 49.9 kg     Data Reviewed:   CBC: Recent Labs  Lab 10/28/19 0609 11/02/19 1007  WBC 7.0 7.7  NEUTROABS 4.3 5.0  HGB 11.2* 10.6*  HCT 34.8* 32.4*  MCV 88.3 86.4  PLT 309 353   Basic Metabolic Panel: Recent Labs  Lab 10/31/19 0324 11/02/19 1007  NA 136 136   K 3.9 3.7  CL 102 103  CO2 24 23  GLUCOSE 111* 77  BUN 8 6  CREATININE 0.70 0.51  CALCIUM 9.0 8.5*   GFR: Estimated Creatinine Clearance: 74.4 mL/min (by C-G formula based on SCr of 0.51 mg/dL). Liver Function Tests: Recent Labs  Lab 10/31/19 0324 11/02/19 1007  AST 25 25  ALT 24 24  ALKPHOS 78 77  BILITOT 0.7 0.5  PROT 7.4 6.3*  ALBUMIN 3.0* 2.5*   No results for input(s): LIPASE, AMYLASE in the last 168 hours. No results for input(s): AMMONIA in the last 168 hours. Coagulation Profile: No results for input(s): INR, PROTIME in the last 168 hours. Cardiac Enzymes: No results for input(s): CKTOTAL, CKMB, CKMBINDEX, TROPONINI in the last 168 hours. BNP (last 3 results) No results for input(s): PROBNP in the last 8760 hours. HbA1C: No results for input(s): HGBA1C in the last 72 hours. CBG: No results for input(s): GLUCAP in the last 168 hours. Lipid Profile: No results for input(s): CHOL, HDL, LDLCALC, TRIG, CHOLHDL, LDLDIRECT in the last 72 hours. Thyroid Function Tests: No results for input(s): TSH, T4TOTAL, FREET4, T3FREE, THYROIDAB in the last 72 hours. Anemia Panel: No results for input(s): VITAMINB12, FOLATE, FERRITIN, TIBC, IRON, RETICCTPCT in the last 72 hours. Sepsis Labs: No results for input(s): PROCALCITON, LATICACIDVEN in the last 168 hours.  Recent Results (from the past 240 hour(s))  Culture, blood (Routine X 2) w Reflex to ID Panel     Status: None (Preliminary result)   Collection Time: 11/02/19 10:25 AM   Specimen: BLOOD  Result Value Ref Range Status   Specimen Description BLOOD LEFT ANTECUBITAL  Final   Special Requests   Final    BOTTLES DRAWN AEROBIC ONLY Blood Culture results may not be optimal due to an inadequate volume of blood received in culture bottles   Culture   Final    NO GROWTH < 24 HOURS Performed at [facility], [address] [place], [place] [address]    Report Status PENDING  Incomplete  Culture, blood (Routine X 2)  w Reflex to ID Panel     Status: None (Preliminary result)   Collection Time: 11/02/19 10:29 AM   Specimen: BLOOD  Result Value Ref Range Status   Specimen Description BLOOD LEFT ANTECUBITAL  Final   Special Requests   Final    BOTTLES DRAWN AEROBIC ONLY Blood Culture results may not be optimal due to an inadequate volume of blood received in culture bottles   Culture   Final    NO GROWTH < 24 HOURS Performed at [facility], [address] [place], [place] [address]    Report Status PENDING  Incomplete         Radiology Studies: MR CERVICAL SPINE W WO CONTRAST  Result Date: 11/01/2019 CLINICAL DATA:  Neck pain, infection or inflammation suspected. Increasing neck pain. Additional history obtained from electronic medical record, status post C5-C6 ACDF 09/29/2019. EXAM: MRI CERVICAL SPINE WITHOUT AND WITH CONTRAST TECHNIQUE: Multiplanar and multiecho pulse sequences of the cervical spine, to include the craniocervical junction and cervicothoracic junction, were obtained without and with intravenous contrast. CONTRAST:  54mL GADAVIST GADOBUTROL 1 MMOL/ML IV SOLN COMPARISON:  MRI cervical and thoracic spine 09/29/2019 FINDINGS: Alignment: Reversal of the expected cervical lordosis centered at C5-C6. Vertebrae: Interval regression of imaging features of discitis/osteomyelitis with abnormal T1 hypointense and T2/STIR hyperintense signal abnormality as well as abnormal enhancement having progressed within the C4, C5, C6 and C7 vertebrae. Additionally, there is signal abnormality and abnormal enhancement as well as osseous erosion involving the bilateral C2-C3 facet joints and right C3-C4 facet joint likely reflecting septic arthritis. There is also abnormal edema and enhancement involving the left C6-C7 facet joint. Abnormal enhancing phlegmon extends into the neural foramina bilaterally at multiple levels. Cord: No spinal cord signal abnormality or abnormal enhancement. Posterior Fossa,  vertebral arteries, paraspinal tissues: No abnormality identified within included portions of the posterior fossa. Persistent ventral epidural phlegmon extending from the C3 level caudally to the C7 level. Ventral greater than dorsal epidural phlegmon is greatest at the C4-C7 levels measuring up to 4 mm in thickness. There is new signal abnormality and abnormal enhancement within the C5-C6 and C6-C7 interspinous spaces consistent with soft tissue infection. There is persistent prevertebral/retropharyngeal phlegmon spanning the C2-C7 levels measuring up to 10 mm in thickness. No prevertebral/retropharyngeal abscess is identified, although susceptibility artifact limits evaluation of the C5-C6 levels. Persistent myositis of the longus coli musculature. There is also edema within the parapharyngeal spaces bilaterally. Disc levels: As compared to prior examination, persistent although decreased spinal canal stenosis with persistent contact upon the ventral spinal cord at the C4-C5 and C6-C7 levels. IMPRESSION: Overall interval progression of findings consistent with discitis/osteomyelitis as detailed and now involving the C4, C5, C6 and C7 vertebrae. Additionally, there is involvement of the bilateral C2-C3 facet joints, right C3-C4 facet  joint as well as left C6-C7 facet joint likely reflecting septic arthritis. Persistent ventral greater than dorsal epidural phlegmon now spanning the C2-C7 levels. There is persistent although decreased spinal canal narrowing at the C4-C7 levels with epidural phlegmon contributing. Persistent prevertebral/retropharyngeal phlegmon. No definite residual prevertebral abscess is identified, although susceptibility artifact limits evaluation at the C5-C6 level. Phlegmon extends into the bilateral neural foramina at multiple levels. Electronically Signed   By: Jackey Loge DO   On: 11/01/2019 15:42   MR THORACIC SPINE W WO CONTRAST  Result Date: 11/01/2019 CLINICAL DATA:  Mid back pain,  greater than 4 weeks or red flag, no prior imaging. EXAM: MRI THORACIC WITHOUT AND WITH CONTRAST TECHNIQUE: Multiplanar and multiecho pulse sequences of the thoracic spine were obtained without and with intravenous contrast. CONTRAST:  37mL GADAVIST GADOBUTROL 1 MMOL/ML IV SOLN COMPARISON:  MRI thoracic spine 09/29/2019 FINDINGS: MRI THORACIC SPINE FINDINGS Alignment: Normal. Vertebrae: No evidence discitis/osteomyelitis within the thoracic region. Redemonstrated probable incidental hemangioma within the right T4 pedicle. Cord:  No spinal cord signal abnormality. Paraspinal and other soft tissues: Bilateral ventral paravertebral phlegmon extends caudally to the T2 level (left greater than right) (series 4, image 14) (series 6, image 4). No definite formed abscess is identified. Previously demonstrated low level epidural inflammation extending to the midthoracic region, particularly posteriorly, is again seen although less conspicuous as compared to prior examination. Disc levels: Redemonstrated small T8-T9 central disc protrusion. No compressive stenosis. No significant disc herniation, spinal canal stenosis or neural foraminal narrowing at the remaining levels. IMPRESSION: No evidence of discitis/osteomyelitis within the thoracic spine. Ventral paravertebral phlegmon extends caudally to the T2 level (left greater than right). No definite formed abscess is identified. Previously demonstrated low-level epidural inflammation extending to the midthoracic region, particularly posteriorly is again seen although less conspicuous as compared to prior examination. Electronically Signed   By: Jackey Loge DO   On: 11/01/2019 15:58   DG Chest Port 1 View  Result Date: 11/02/2019 CLINICAL DATA:  Fever.  Infection EXAM: PORTABLE CHEST 1 VIEW COMPARISON:  09/29/2019 FINDINGS: Normal heart size and mediastinal contours. No acute infiltrate or edema. Nodular densities attributed to nipple shadows. No effusion or pneumothorax.  No acute osseous findings. IMPRESSION: No active disease. Electronically Signed   By: Marnee Spring M.D.   On: 11/02/2019 07:40   VAS [place] UPPER EXTREMITY VENOUS DUPLEX  Result Date: 11/02/2019 UPPER VENOUS STUDY  Indications: Pain, and Swelling Limitations: Bandages. Comparison Study: Left upper extremity venous duplex exam 10-31-19, positive. Performing Technologist: Miles Costain, RVT  Examination Guidelines: A complete evaluation includes B-mode imaging, spectral Doppler, color Doppler, and power Doppler as needed of all accessible portions of each vessel. Bilateral testing is considered an integral part of a complete examination. Limited examinations for reoccurring indications may be performed as noted.  Right Findings: +----------+------------+---------+-----------+----------+-------+ RIGHT     CompressiblePhasicitySpontaneousPropertiesSummary +----------+------------+---------+-----------+----------+-------+ IJV           Full       Yes       Yes                      +----------+------------+---------+-----------+----------+-------+ Subclavian    Full       Yes       Yes                      +----------+------------+---------+-----------+----------+-------+ Axillary      Full       Yes  Yes                      +----------+------------+---------+-----------+----------+-------+ Brachial      None       No        No      dilated  Chronic +----------+------------+---------+-----------+----------+-------+ Radial        Full                                          +----------+------------+---------+-----------+----------+-------+ Ulnar         Full                                          +----------+------------+---------+-----------+----------+-------+ Cephalic      None       No        No               Chronic +----------+------------+---------+-----------+----------+-------+ Basilic       Full                                           +----------+------------+---------+-----------+----------+-------+  Summary:  Right: Findings consistent with acute superficial vein thrombosis involving the right cephalic vein. Findings consistent with chronic deep vein thrombosis involving the right brachial veins.  *See table(s) above for measurements and observations.  Diagnosing physician: Fabienne Bruns MD Electronically signed by Fabienne Bruns MD on 11/02/2019 at 5:20:26 PM.    Final         Scheduled Meds: . acetaminophen  1,000 mg Oral TID  . amphetamine-dextroamphetamine  30 mg Oral BID  . ARIPiprazole  20 mg Oral Daily  . buPROPion  150 mg Oral Daily  . Chlorhexidine Gluconate Cloth  6 each Topical Q0600  . diclofenac Sodium  4 g Topical QID  . enoxaparin (LOVENOX) injection  50 mg Subcutaneous Q12H  . feeding supplement (ENSURE ENLIVE)  237 mL Oral BID BM  . gabapentin  100 mg Oral TID  . multivitamin with minerals  1 tablet Oral Daily  . nicotine  14 mg Transdermal Daily  . rifampin  300 mg Oral Q12H  . senna-docusate  1 tablet Oral BID  . sodium chloride flush  10-40 mL Intracatheter Q12H  . sodium chloride flush  3 mL Intravenous Q12H   Continuous Infusions: . sodium chloride Stopped (10/24/19 1012)  . sodium chloride 250 mL (11/02/19 0043)  . vancomycin HCl 1,500 mg (11/03/19 0314)     LOS: 35 days   Time spent= 35 mins    Skylor Hughson Joline Maxcy, MD [facility]  If 7PM-7AM, please contact night-coverage  11/03/2019, 8:14 AM

## 2019-11-03 NOTE — Progress Notes (Signed)
Patient has decided to set password and wants family to access to talk to MD's regarding care.

## 2019-11-03 NOTE — Care Management (Signed)
Went to patient's room twice today. Both times patient was in restroom. Called patient on phone no answer. Will attempt to see again later.   Ronny Flurry RN

## 2019-11-03 NOTE — Progress Notes (Signed)
OT Cancellation Note  Patient Details Name: Sarinity Dicicco MRN: [id number] DOB: 10/06/1980   Cancelled Treatment:    Reason Eval/Treat Not Completed: Other (comment). Assessed for use of Kinesiotape to help with pain control. It is contraindicated to use Kinesiotape @ DVT site or in vicinity of infection. Will follow up later date.   Thornell Mule, OT/L   Acute OT Clinical Specialist Acute Rehabilitation Services Pager [phone] Office [phone]  11/03/2019, 12:04 PM

## 2019-11-04 DIAGNOSIS — R509 Fever, unspecified: Secondary | ICD-10-CM

## 2019-11-04 DIAGNOSIS — M25511 Pain in right shoulder: Secondary | ICD-10-CM

## 2019-11-04 LAB — CBC WITH DIFFERENTIAL/PLATELET
Abs Immature Granulocytes: 0.02 10*3/uL (ref 0.00–0.07)
Basophils Absolute: 0.1 10*3/uL (ref 0.0–0.1)
Basophils Relative: 1 %
Eosinophils Absolute: 0.2 10*3/uL (ref 0.0–0.5)
Eosinophils Relative: 3 %
HCT: 32.9 % — ABNORMAL LOW (ref 36.0–46.0)
Hemoglobin: 10.5 g/dL — ABNORMAL LOW (ref 12.0–15.0)
Immature Granulocytes: 0 %
Lymphocytes Relative: 28 %
Lymphs Abs: 1.7 10*3/uL (ref 0.7–4.0)
MCH: 28 pg (ref 26.0–34.0)
MCHC: 31.9 g/dL (ref 30.0–36.0)
MCV: 87.7 fL (ref 80.0–100.0)
Monocytes Absolute: 0.4 10*3/uL (ref 0.1–1.0)
Monocytes Relative: 7 %
Neutro Abs: 3.6 10*3/uL (ref 1.7–7.7)
Neutrophils Relative %: 61 %
Platelets: 358 10*3/uL (ref 150–400)
RBC: 3.75 MIL/uL — ABNORMAL LOW (ref 3.87–5.11)
RDW: 14.2 % (ref 11.5–15.5)
WBC: 6 10*3/uL (ref 4.0–10.5)
nRBC: 0 % (ref 0.0–0.2)

## 2019-11-04 LAB — COMPREHENSIVE METABOLIC PANEL
ALT: 23 U/L (ref 0–44)
AST: 25 U/L (ref 15–41)
Albumin: 2.3 g/dL — ABNORMAL LOW (ref 3.5–5.0)
Alkaline Phosphatase: 79 U/L (ref 38–126)
Anion gap: 11 (ref 5–15)
BUN: 7 mg/dL (ref 6–20)
CO2: 21 mmol/L — ABNORMAL LOW (ref 22–32)
Calcium: 8.6 mg/dL — ABNORMAL LOW (ref 8.9–10.3)
Chloride: 104 mmol/L (ref 98–111)
Creatinine, Ser: 0.63 mg/dL (ref 0.44–1.00)
GFR calc Af Amer: >60 mL/min (ref >60)
GFR calc non Af Amer: >60 mL/min (ref >60)
Glucose, Bld: 185 mg/dL — ABNORMAL HIGH (ref 70–99)
Potassium: 3.7 mmol/L (ref 3.5–5.1)
Sodium: 136 mmol/L (ref 135–145)
Total Bilirubin: 0.5 mg/dL (ref 0.3–1.2)
Total Protein: 6.9 g/dL (ref 6.5–8.1)

## 2019-11-04 LAB — MAGNESIUM: Magnesium: 1.8 mg/dL (ref 1.7–2.4)

## 2019-11-04 MED ORDER — HYDROMORPHONE HCL 2 MG PO TABS
4.0000 mg | ORAL_TABLET | ORAL | Status: DC | PRN
  Administered 2019-11-04 – 2019-11-14 (×53): 4 mg via ORAL
  Filled 2019-11-04 (×54): qty 2

## 2019-11-04 NOTE — Progress Notes (Signed)
Occupational Therapy Treatment Patient Details Name: Colleen Ramirez MRN: [id number] DOB: Apr 08, 1981 Today's Date: 11/04/2019    History of present illness Colleen Ramirez is a 39 y.o. F admitted to the ED with c/o severe neck pain secondary to a cervical epidural abscess. She is s/p C5-6 anterior cervical discectomy and instrumented fusion. PMH: IVDU, anemia, anxiety, depression, recent admission for facial cellulitis.    OT comments  Pt up ambulating around room when OT arrived, Pt completed grooming tasks at the sink at mod I level - utilizing cup method to maintain cervical precautions. Provided Pt with anxiety management handouts and reviewed (Pt verbalized that she is familiar with strategies). At this time kinesiotaping is contraindicated due to DVT and infection, but OT will continue to follow acutely for appropriateness.    Follow Up Recommendations  No OT follow up    Equipment Recommendations  None recommended by OT    Recommendations for Other Services      Precautions / Restrictions Precautions Precautions: Cervical Precaution Comments: reviewed cervical precautions with pt, pt with brace off upon arrival Required Braces or Orthoses: Cervical Brace Cervical Brace: Soft collar;For comfort       Mobility Bed Mobility                  Transfers Overall transfer level: Independent                    Balance Overall balance assessment: Independent                                         ADL either performed or assessed with clinical judgement   ADL Overall ADL's : Modified independent     Grooming: Wash/dry face;Wash/dry hands;Oral care;Modified independent;Standing Grooming Details (indicate cue type and reason): Pt utilizing cup method for oral care, able to stand at sink to complete grooming tasks                 Toilet Transfer: Modified Independent;Ambulation                   Vision       Perception      Praxis      Cognition Arousal/Alertness: Awake/alert Behavior During Therapy: WFL for tasks assessed/performed Overall Cognitive Status: Within Functional Limits for tasks assessed                                          Exercises     Shoulder Instructions       General Comments Pt educated in anxiety management strategies - handouts provided.    Pertinent Vitals/ Pain       Pain Assessment: Faces Faces Pain Scale: Hurts little more Pain Location: right shoulder/arm and neck Pain Descriptors / Indicators: Discomfort;Sore;Aching Pain Intervention(s): Limited activity within patient's tolerance;Monitored during session  Home Living                                          Prior Functioning/Environment              Frequency  Min 2X/week        Progress Toward Goals  OT Goals(current goals can  now be found in the care plan section)  Progress towards OT goals: Progressing toward goals  Acute Rehab OT Goals Patient Stated Goal: decrease pain OT Goal Formulation: With patient Time For Goal Achievement: 11/18/19 Potential to Achieve Goals: Good  Plan Frequency remains appropriate    Co-evaluation                 AM-PAC OT "6 Clicks" Daily Activity     Outcome Measure   Help from another person eating meals?: None Help from another person taking care of personal grooming?: None Help from another person toileting, which includes using toliet, bedpan, or urinal?: None Help from another person bathing (including washing, rinsing, drying)?: None Help from another person to put on and taking off regular upper body clothing?: None Help from another person to put on and taking off regular lower body clothing?: None 6 Click Score: 24    End of Session    OT Visit Diagnosis: Muscle weakness (generalized) (M62.81);Pain Pain - Right/Left: Right Pain - part of body: Shoulder;Arm(neck)   Activity Tolerance Patient  tolerated treatment well   Patient Left in chair   Nurse Communication Mobility status        Time: 0623-7628 OT Time Calculation (min): 10 min  Charges: OT General Charges $OT Visit: 1 Visit OT Treatments $Self Care/Home Management : 8-22 mins  Jesse Sans OTR/L Acute Rehabilitation Services Pager: [phone] Office: [place] 11/04/2019, 1:40 PM

## 2019-11-04 NOTE — Progress Notes (Signed)
Subjective:  Complaining of neck pain  Antibiotics:  Anti-infectives (From admission, onward)   Start     Dose/Rate Route Frequency Ordered Stop   11/01/19 1400  vancomycin (VANCOREADY) IVPB 1500 mg/300 mL     1,500 mg 150 mL/hr over 120 Minutes Intravenous Every 12 hours 11/01/19 1305     10/28/19 1000  vancomycin (VANCOREADY) IVPB 1500 mg/300 mL  Status:  Discontinued     1,500 mg 150 mL/hr over 120 Minutes Intravenous Every 12 hours 10/27/19 2353 11/01/19 1305   10/14/19 1000  vancomycin (VANCOREADY) IVPB 1250 mg/250 mL  Status:  Discontinued     1,250 mg 166.7 mL/hr over 90 Minutes Intravenous Every 12 hours 10/14/19 0540 10/27/19 2353   10/13/19 0945  Vancomycin (VANCOCIN) 1,250 mg in sodium chloride 0.9 % 250 mL IVPB  Status:  Discontinued     1,250 mg 166.7 mL/hr over 90 Minutes Intravenous Every 12 hours 10/13/19 0935 10/14/19 0540   10/13/19 0930  Vancomycin (VANCOCIN) 1,250 mg in sodium chloride 0.9 % 250 mL IVPB  Status:  Discontinued     1,250 mg 166.7 mL/hr over 90 Minutes Intravenous Every 12 hours 10/13/19 0902 10/13/19 0934   10/09/19 2000  vancomycin (VANCOCIN) IVPB 1000 mg/200 mL premix  Status:  Discontinued     1,000 mg 200 mL/hr over 60 Minutes Intravenous Every 12 hours 10/09/19 1121 10/13/19 0902   10/04/19 2200  vancomycin (VANCOCIN) IVPB 750 mg/150 ml premix  Status:  Discontinued     750 mg 150 mL/hr over 60 Minutes Intravenous Every 8 hours 10/04/19 1305 10/09/19 1121   10/04/19 1400  vancomycin (VANCOCIN) IVPB 1000 mg/200 mL premix     1,000 mg 200 mL/hr over 60 Minutes Intravenous STAT 10/04/19 1305 10/04/19 1604   10/01/19 1100  rifampin (RIFADIN) capsule 300 mg     300 mg Oral Every 12 hours 10/01/19 1056     09/30/19 1800  Vancomycin (VANCOCIN) 1,250 mg in sodium chloride 0.9 % 250 mL IVPB  Status:  Discontinued     1,250 mg 166.7 mL/hr over 90 Minutes Intravenous Every 24 hours 09/30/19 0003 09/30/19 0747   09/30/19 1800  vancomycin  (VANCOREADY) IVPB 1250 mg/250 mL  Status:  Discontinued     1,250 mg 166.7 mL/hr over 90 Minutes Intravenous Every 24 hours 09/30/19 0746 10/04/19 1305   09/30/19 0400  piperacillin-tazobactam (ZOSYN) IVPB 3.375 g  Status:  Discontinued     3.375 g 12.5 mL/hr over 240 Minutes Intravenous Every 8 hours 09/30/19 0003 09/30/19 1533   09/29/19 2145  piperacillin-tazobactam (ZOSYN) IVPB 3.375 g     3.375 g 12.5 mL/hr over 240 Minutes Intravenous STAT 09/29/19 2137 09/29/19 2148   09/29/19 2119  bacitracin 50,000 Units in sodium chloride 0.9 % 500 mL irrigation  Status:  Discontinued       As needed 09/29/19 2119 09/29/19 2249   09/29/19 2033  vancomycin (VANCOCIN) 1-5 GM/200ML-% IVPB    Note to Pharmacy: Ubaldo Glassing   : cabinet override      09/29/19 2033 09/30/19 0844      Medications: Scheduled Meds: . acetaminophen  1,000 mg Oral TID  . amphetamine-dextroamphetamine  30 mg Oral BID  . ARIPiprazole  20 mg Oral Daily  . buPROPion  150 mg Oral Daily  . Chlorhexidine Gluconate Cloth  6 each Topical Q0600  . diclofenac Sodium  4 g Topical QID  . enoxaparin (LOVENOX) injection  50 mg Subcutaneous Q12H  .  feeding supplement (ENSURE ENLIVE)  237 mL Oral BID BM  . gabapentin  100 mg Oral TID  . multivitamin with minerals  1 tablet Oral Daily  . nicotine  14 mg Transdermal Daily  . rifampin  300 mg Oral Q12H  . senna-docusate  1 tablet Oral BID  . sodium chloride flush  10-40 mL Intracatheter Q12H  . sodium chloride flush  3 mL Intravenous Q12H   Continuous Infusions: . sodium chloride Stopped (10/24/19 1012)  . sodium chloride 250 mL (11/02/19 0043)  . vancomycin HCl 1,500 mg (11/04/19 0250)   PRN Meds:.sodium chloride, cyclobenzaprine, diazepam, gadobutrol, HYDROmorphone, ondansetron **OR** ondansetron (ZOFRAN) IV, oxymetazoline, polyethylene glycol, senna-docusate, sodium chloride, sodium chloride flush, sodium chloride flush, traZODone    Objective: Weight change:    Intake/Output Summary (Last 24 hours) at 11/04/2019 1409 Last data filed at 11/04/2019 0900 Gross per 24 hour  Intake 1330 ml  Output 2 ml  Net 1328 ml   Blood pressure 129/88, pulse 98, temperature 97.9 F (36.6 C), temperature source Oral, resp. rate 19, height 5\' 3"  (1.6 m), weight 49.9 kg, SpO2 100 %. Temp:  [97.9 F (36.6 C)-98.6 F (37 C)] 97.9 F (36.6 C) (02/17 0744) Pulse Rate:  [98-106] 98 (02/17 0744) Resp:  [16-19] 19 (02/17 0744) BP: (101-131)/(73-88) 129/88 (02/17 0744) SpO2:  [97 %-100 %] 100 % (02/17 0744)  Physical Exam: General: Alert and awake, oriented x3, not in any acute distress. HEENT: anicteric sclera, EOMI CVS regular rate, normal  Chest: , no wheezing, no respiratory distress, she has some tenderness above the sternoclavicular joint though it is not especially inflamed Abdomen: soft non-distended,  Extremities: no edema or deformity noted bilaterally Skin: no rashes Neuro: nonfocal  CBC:    BMET Recent Labs    11/02/19 1007 11/04/19 0542  NA 136 136  K 3.7 3.7  CL 103 104  CO2 23 21*  GLUCOSE 77 185*  BUN 6 7  CREATININE 0.51 0.63  CALCIUM 8.5* 8.6*     Liver Panel  Recent Labs    11/02/19 1007 11/04/19 0542  PROT 6.3* 6.9  ALBUMIN 2.5* 2.3*  AST 25 25  ALT 24 23  ALKPHOS 77 79  BILITOT 0.5 0.5       Sedimentation Rate No results for input(s): ESRSEDRATE in the last 72 hours. C-Reactive Protein No results for input(s): CRP in the last 72 hours.  Micro Results: Recent Results (from the past 720 hour(s))  Culture, blood (Routine X 2) w Reflex to ID Panel     Status: None (Preliminary result)   Collection Time: 11/02/19 10:25 AM   Specimen: BLOOD  Result Value Ref Range Status   Specimen Description BLOOD LEFT ANTECUBITAL  Final   Special Requests   Final    BOTTLES DRAWN AEROBIC ONLY Blood Culture results may not be optimal due to an inadequate volume of blood received in culture bottles   Culture   Final    NO  GROWTH 2 DAYS Performed at [facility], [address], [place], [place]    Report Status PENDING  Incomplete  Culture, blood (Routine X 2) w Reflex to ID Panel     Status: None (Preliminary result)   Collection Time: 11/02/19 10:29 AM   Specimen: BLOOD  Result Value Ref Range Status   Specimen Description BLOOD LEFT ANTECUBITAL  Final   Special Requests   Final    BOTTLES DRAWN AEROBIC ONLY Blood Culture results may not be optimal  due to an inadequate volume of blood received in culture bottles   Culture   Final    NO GROWTH 2 DAYS Performed at [facility], [address], [place], [place] [address]    Report Status PENDING  Incomplete    Studies/Results: CT CHEST W CONTRAST  Result Date: 11/03/2019 CLINICAL DATA:  Epidural abscess of cervical spine, sepsis EXAM: CT CHEST WITH CONTRAST TECHNIQUE: Multidetector CT imaging of the chest was performed during intravenous contrast administration. CONTRAST:  20mL OMNIPAQUE IOHEXOL 300 MG/ML  SOLN COMPARISON:  MR cervical spine, 11/01/2019, chest radiographs, 11/01/2018 FINDINGS: Cardiovascular: There is extensive soft tissue stranding about the right brachial and axillary artery and vein in the partially included right upper extremity (series 3, image 53). Normal heart size. No pericardial effusion. Mediastinum/Nodes: No enlarged mediastinal, hilar, or axillary lymph nodes. Thyroid gland, trachea, and esophagus demonstrate no significant findings. Lungs/Pleura: Lungs are clear. No pleural effusion or pneumothorax. Upper Abdomen: No acute abnormality. Postoperative findings of partial gastrectomy. Musculoskeletal: No chest wall mass or suspicious bone lesions identified. Prevertebral soft tissue stranding/phlegmon partially imaged in the lower neck, better assessed by prior MR of the cervical spine. IMPRESSION: 1. No acute intrathoracic abnormality. 2. Extensive soft tissue stranding about the right brachial and axillary artery  and vein in the partially included right upper extremity, in keeping with known deep venous thrombosis. 3. Prevertebral soft tissue stranding/phlegmon, partially imaged in the lower neck, better assessed by prior MR cervical spine. Electronically Signed   By: Lauralyn Primes M.D.   On: 11/03/2019 20:50      Assessment/Plan:  INTERVAL HISTORY:   CT chest unrevealing for NEW pathology   Principal Problem:   Abscess in epidural space of cervical spine Active Problems:   Depression   Acquired fusion of cervical spine   IVDU (intravenous drug user)   Homeless   S/P cervical discectomy   Anxiety   Status post gastric bypass for obesity   Hepatitis C antibody positive in blood   Acute bilateral thoracic back pain   Body aches   Methamphetamine abuse (HCC)   Pain of right sternoclavicular joint    Colleen Ramirez is a 39 y.o. 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  She now has had worsening of C spine pathology DESPITE surgery and aggressive antibiotics with vancomycin and rifampin. Vancomycin AUC have been appropriate.  #1 Cervical spine MRIs discitis epidural abscess status post surgery and fusion now with worsening of pathology on imaging neck pain and fever  Dr Maurice Small does not see need for surgical intervention  From an antibiotic standpoint she is on aggressive and appropriate antibiotics  We WILL need to reset clock on her 6-8 weeks of parenteral antibiotics and then follow that with months of po anbiotics  Start date for new course of antibiotics is date of MRI (Sunday)  IF she goes to an oral anticoagulant would change her rifampin for rifabutin 300 mg daily  I also would have LOW THRESHOLD to repeat MRI of her neck given phlegmon present and  given risk that she may develop PYOMYOSITIS in neck muscles that might very well require intervention  #2  Right arm pain  she has a DVT in this place  #3 Fevers: have subsided and may be due to her DVT  New stop date for IV antibiotics  March 26th  I will sign off for now but please call me/us  back if new problems questions arise and again would have low threshold to re MRI neck in next week     LOS: 36 days   Acey Lav 11/04/2019, 2:09 PM

## 2019-11-04 NOTE — Plan of Care (Signed)
  Problem: Clinical Measurements: Goal: Will remain free from infection Outcome: Progressing Goal: Diagnostic test results will improve Outcome: Progressing Goal: Respiratory complications will improve Outcome: Progressing   Problem: Nutrition: Goal: Adequate nutrition will be maintained Outcome: Progressing   Problem: Elimination: Goal: Will not experience complications related to bowel motility Outcome: Progressing   Problem: Pain Managment: Goal: General experience of comfort will improve Outcome: Progressing   Problem: Safety: Goal: Ability to remain free from injury will improve Outcome: Progressing   Problem: Skin Integrity: Goal: Risk for impaired skin integrity will decrease Outcome: Progressing

## 2019-11-04 NOTE — Progress Notes (Signed)
PROGRESS NOTE    Colleen Ramirez  KWI:[id number] DOB: 15-Oct-1980 DOA: 09/29/2019 PCP: Patient, No Pcp Per   Brief Narrative:  39 year old with history of obesity status post gastric bypass, homelessness, IV drug abuse-methamphetamine, anemia of chronic disease admitted for sharp neck pain.  Previously admitted here couple months ago for facial cellulitis requiring 10 days of doxycycline and topical clindamycin.  She underwent ACDF on 1/12 for MRSA epidural abscess requiring IV vancomycin and oral rifampin through 11/09/2019. She now has had worsening of C spine pathology DESPITE surgery and aggressive antibiotics with vancomycin and rifampin. Vancomycin AUC have been appropriate. Currently remaining inpatient to complete her treatment course.  Due to swelling of right upper extremity concerns for thrombophlebitis patient was transitioned to Lovenox treatment dosing.  Subjective.  She was complaining of right pain and pain on the right side of her chest. She was becoming very emotional that why she restarted taking IV drugs after staying sober for many years.  She was also concerned about staying that long in the hospital.  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 -Original plan plan to continue vancomycin IV/oral rifampin until 11/09/2019.  Followed by doxycycline for a month.  But now due to recurrent fever, she will need further 6 to 8 weeks of IV antibiotics followed by oral antibiotics. New stop  date for antibiotic is now 12/11/2019. -Per ID low threshold to repeat MRI of the neck as she is high risk for developing pyomyositis if she continued to have worsening neck pain. -Seen by ID and neurosurgery  Right upper extremity swelling secondary to superficial vein thrombosis -Supportive care, pain control.  Elevate arm, compression dressing if necessary. -Patient is already been started on Lovenox every 12 hours.  History of chronic pain History of gastric bypass/Roux-en-Y -Continue Neurontin 100 mg 3 times daily -Extensive discussion with the patient and her family member regarding her pain medications, at this point I will discontinue oxycodone and transition her to p.o. Dilaudid.  I have clearly advised her that she will not be given IV pain medications unless clinically clearly indicated.  In the meantime we can titrate p.o. pain medications as deemed appropriate.  I explained her and the family members that I fear about narcotic overdose subsequently leading to respiratory failure. -Tylenol  History of depression/anxiety -Continue Adderall, Abilify and Valium  Tobacco use -Nicotine patch  Hepatitis C positive antibody -Follow-up with infectious disease  Anemia of chronic disease -Hemoglobin stable.   DVT prophylaxis: Lovenox Code Status: Full Family Communication: No family at bedside Disposition Plan:   Patient From= home  Patient Anticipated D/C place= Home  Barriers= maintain hospital stay at this time for IV antibiotics.  Given homelessness, lack of medical resources support she will need to remain in the hospital until the completion of this.  Consultants:   Neurosurgery  Infectious disease  Procedures:   ACDF 1/12  Antimicrobials:   IV vancomycin  Oral Rocephin  Review of Systems Otherwise negative  except as per HPI, including: General: Denies fever, chills, night sweats or unintended weight loss. Resp: Denies cough, wheezing, shortness of  breath. Cardiac: Denies chest pain, palpitations, orthopnea, paroxysmal nocturnal dyspnea. GI: Denies abdominal pain, nausea, vomiting, diarrhea or constipation GU: Denies dysuria, frequency, hesitancy or incontinence MS: Denies muscle aches, joint pain or swelling Neuro: Denies headache, neurologic deficits (focal weakness, numbness, tingling), abnormal gait Psych: Denies anxiety, depression, SI/HI/AVH Skin: Denies new rashes or lesions ID: Denies sick contacts, exotic exposures, travel  Examination:  General exam: Appears calm and comfortable  Respiratory system: Clear to auscultation. Respiratory effort normal. Cardiovascular system: S1 & S2 heard, RRR. No JVD, murmurs, rubs, gallops or clicks. No pedal edema. Gastrointestinal system: Abdomen is nondistended, soft and nontender. No organomegaly or masses felt. Normal bowel sounds heard. Central nervous system: Alert and oriented. No focal neurological deficits. Extremities: Right upper extremity slightly tender to touch especially in the upper arm area leading down to her forearm.  No obvious evidence of drainage.   Skin: No rashes, lesions or ulcers Psychiatry: Judgement and insight appear normal. Mood & affect appropriate.   Objective: Vitals:   11/03/19 1547 11/03/19 2121 11/04/19 0506 11/04/19 0744  BP: 101/73 131/79 117/88 129/88  Pulse: (!) 101 98 (!) 106 98  Resp: 18 18 16 19   Temp: 97.9 F (36.6 C) 98.6 F (37 C) 98.6 F (37 C) 97.9 F (36.6 C)  TempSrc: Oral Oral Oral Oral  SpO2: 100% 97% 100% 100%  Weight:      Height:        Intake/Output Summary (Last 24 hours) at 11/04/2019 0815 Last data filed at 11/03/2019 2246 Gross per 24 hour  Intake 850 ml  Output 1 ml  Net 849 ml   Filed Weights   09/29/19 2032  Weight: 49.9 kg     Data Reviewed:   CBC: Recent Labs  Lab 11/02/19 1007 11/04/19 0542  WBC 7.7 6.0  NEUTROABS 5.0 3.6  HGB 10.6* 10.5*  HCT 32.4* 32.9*  MCV 86.4 87.7  PLT 301 358   Basic  Metabolic Panel: Recent Labs  Lab 10/31/19 0324 11/02/19 1007 11/04/19 0542  NA 136 136 136  K 3.9 3.7 3.7  CL 102 103 104  CO2 24 23 21*  GLUCOSE 111* 77 185*  BUN 8 6 7   CREATININE 0.70 0.51 0.63  CALCIUM 9.0 8.5* 8.6*  MG  --   --  1.8   GFR: Estimated Creatinine Clearance: 74.4 mL/min (by C-G formula based on SCr of 0.63 mg/dL). Liver Function Tests: Recent Labs  Lab 10/31/19 0324 11/02/19 1007 11/04/19 0542  AST 25 25 25   ALT 24 24 23   ALKPHOS 78 77 79  BILITOT 0.7 0.5 0.5  PROT 7.4 6.3* 6.9  ALBUMIN 3.0* 2.5* 2.3*   No results for input(s): LIPASE, AMYLASE in the last 168 hours. No results for input(s): AMMONIA in the last 168 hours. Coagulation Profile: No results for input(s): INR, PROTIME in the last 168 hours. Cardiac Enzymes: No results for input(s): CKTOTAL, CKMB, CKMBINDEX, TROPONINI in the last 168 hours. BNP (last 3 results) No results for input(s): PROBNP in the last 8760 hours. HbA1C: No results for input(s): HGBA1C in the last 72 hours. CBG: No results for input(s): GLUCAP in the last 168 hours. Lipid Profile: No results for input(s): CHOL, HDL, LDLCALC, TRIG, CHOLHDL, LDLDIRECT in the last 72 hours. Thyroid Function Tests: No results for input(s): TSH, T4TOTAL, FREET4, T3FREE, THYROIDAB in the last 72 hours. Anemia Panel:  No results for input(s): VITAMINB12, FOLATE, FERRITIN, TIBC, IRON, RETICCTPCT in the last 72 hours. Sepsis Labs: No results for input(s): PROCALCITON, LATICACIDVEN in the last 168 hours.  Recent Results (from the past 240 hour(s))  Culture, blood (Routine X 2) w Reflex to ID Panel     Status: None (Preliminary result)   Collection Time: 11/02/19 10:25 AM   Specimen: BLOOD  Result Value Ref Range Status   Specimen Description BLOOD LEFT ANTECUBITAL  Final   Special Requests   Final    BOTTLES DRAWN AEROBIC ONLY Blood Culture results may not be optimal due to an inadequate volume of blood received in culture bottles    Culture   Final    NO GROWTH 2 DAYS Performed at [facility], [address] [place], [place] [address]    Report Status PENDING  Incomplete  Culture, blood (Routine X 2) w Reflex to ID Panel     Status: None (Preliminary result)   Collection Time: 11/02/19 10:29 AM   Specimen: BLOOD  Result Value Ref Range Status   Specimen Description BLOOD LEFT ANTECUBITAL  Final   Special Requests   Final    BOTTLES DRAWN AEROBIC ONLY Blood Culture results may not be optimal due to an inadequate volume of blood received in culture bottles   Culture   Final    NO GROWTH 2 DAYS Performed at [facility], [address] [place], [place] [address]    Report Status PENDING  Incomplete     Radiology Studies: CT CHEST W CONTRAST  Result Date: 11/03/2019 CLINICAL DATA:  Epidural abscess of cervical spine, sepsis EXAM: CT CHEST WITH CONTRAST TECHNIQUE: Multidetector CT imaging of the chest was performed during intravenous contrast administration. CONTRAST:  66mL OMNIPAQUE IOHEXOL 300 MG/ML  SOLN COMPARISON:  MR cervical spine, 11/01/2019, chest radiographs, 11/01/2018 FINDINGS: Cardiovascular: There is extensive soft tissue stranding about the right brachial and axillary artery and vein in the partially included right upper extremity (series 3, image 53). Normal heart size. No pericardial effusion. Mediastinum/Nodes: No enlarged mediastinal, hilar, or axillary lymph nodes. Thyroid gland, trachea, and esophagus demonstrate no significant findings. Lungs/Pleura: Lungs are clear. No pleural effusion or pneumothorax. Upper Abdomen: No acute abnormality. Postoperative findings of partial gastrectomy. Musculoskeletal: No chest wall mass or suspicious bone lesions identified. Prevertebral soft tissue stranding/phlegmon partially imaged in the lower neck, better assessed by prior MR of the cervical spine. IMPRESSION: 1. No acute intrathoracic abnormality. 2. Extensive soft tissue stranding about the right  brachial and axillary artery and vein in the partially included right upper extremity, in keeping with known deep venous thrombosis. 3. Prevertebral soft tissue stranding/phlegmon, partially imaged in the lower neck, better assessed by prior MR cervical spine. Electronically Signed   By: Lauralyn Primes M.D.   On: 11/03/2019 20:50   VAS [place] UPPER EXTREMITY VENOUS DUPLEX  Result Date: 11/02/2019 UPPER VENOUS STUDY  Indications: Pain, and Swelling Limitations: Bandages. Comparison Study: Left upper extremity venous duplex exam 10-31-19, positive. Performing Technologist: Miles Costain, RVT  Examination Guidelines: A complete evaluation includes B-mode imaging, spectral Doppler, color Doppler, and power Doppler as needed of all accessible portions of each vessel. Bilateral testing is considered an integral part of a complete examination. Limited examinations for reoccurring indications may be performed as noted.  Right Findings: +----------+------------+---------+-----------+----------+-------+ RIGHT     CompressiblePhasicitySpontaneousPropertiesSummary +----------+------------+---------+-----------+----------+-------+ IJV           Full       Yes  Yes                      +----------+------------+---------+-----------+----------+-------+ Subclavian    Full       Yes       Yes                      +----------+------------+---------+-----------+----------+-------+ Axillary      Full       Yes       Yes                      +----------+------------+---------+-----------+----------+-------+ Brachial      None       No        No      dilated  Chronic +----------+------------+---------+-----------+----------+-------+ Radial        Full                                          +----------+------------+---------+-----------+----------+-------+ Ulnar         Full                                           +----------+------------+---------+-----------+----------+-------+ Cephalic      None       No        No               Chronic +----------+------------+---------+-----------+----------+-------+ Basilic       Full                                          +----------+------------+---------+-----------+----------+-------+  Summary:  Right: Findings consistent with acute superficial vein thrombosis involving the right cephalic vein. Findings consistent with chronic deep vein thrombosis involving the right brachial veins.  *See table(s) above for measurements and observations.  Diagnosing physician: Ruta Hinds MD Electronically signed by Ruta Hinds MD on 11/02/2019 at 5:20:26 PM.    Final     Scheduled Meds: . acetaminophen  1,000 mg Oral TID  . amphetamine-dextroamphetamine  30 mg Oral BID  . ARIPiprazole  20 mg Oral Daily  . buPROPion  150 mg Oral Daily  . Chlorhexidine Gluconate Cloth  6 each Topical Q0600  . diclofenac Sodium  4 g Topical QID  . enoxaparin (LOVENOX) injection  50 mg Subcutaneous Q12H  . feeding supplement (ENSURE ENLIVE)  237 mL Oral BID BM  . gabapentin  100 mg Oral TID  . multivitamin with minerals  1 tablet Oral Daily  . nicotine  14 mg Transdermal Daily  . rifampin  300 mg Oral Q12H  . senna-docusate  1 tablet Oral BID  . sodium chloride flush  10-40 mL Intracatheter Q12H  . sodium chloride flush  3 mL Intravenous Q12H   Continuous Infusions: . sodium chloride Stopped (10/24/19 1012)  . sodium chloride 250 mL (11/02/19 0043)  . vancomycin HCl 1,500 mg (11/04/19 0250)     LOS: 36 days   Time spent= 35 mins   Lorella Nimrod, MD [facility]  If 7PM-7AM, please contact night-coverage  11/04/2019, 8:15 AM   This record has been created using Dragon voice recognition software. Errors have been sought and  corrected,but may not always be located. Such creation errors do not reflect on the standard of care.

## 2019-11-04 NOTE — TOC Progression Note (Signed)
Transition of Care [facility]) - Progression Note    Patient Details  Name: Colleen Ramirez MRN: [id number] Date of Birth: [birth year]  Transition of [facility] CM/SW Contact  Nadene Rubins Adria Devon, RN Phone Number: 11/04/2019, 1:52 PM  Clinical Narrative:     Spoke to patient at length. She does not want polysubstance resources.  Prior to admission patient had just moved to [facility] and was staying at a hotel.   She moved here from [place] to "get away" from her ex. She has a 65 year old son here in [place].    Parents are in [place] .   Patient wants to be transferred  to a hospital closer to [place]. MD yesterday spoke to patient regarding this.   At discharge patient states her family can pay for whatever she needs.  She plans to go to [place] and stay with her aunt. Her mother possibility may come to [place].  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  Patient states she is not homeless. Her family "has money" and will help her at discharge.   Expected Discharge Plan: Home/Self Care Barriers to Discharge: No Barriers Identified  Expected Discharge Plan and Services Expected Discharge Plan: Home/Self Care In-house Referral: Clinical Social Work Discharge Planning Services: CM Consult   Living arrangements for the past 2 months: Homeless                                       Social Determinants of Health (SDOH) Interventions    Readmission Risk Interventions Readmission Risk Prevention Plan 08/25/2019 08/24/2019  Post Dischage Appt - Not Complete  Appt Comments - unknown address at d/c  Medication Screening - Complete  Transportation Screening Complete Complete  PCP or Specialist Appt within 5-7 Days Not Complete -  Not Complete comments got the earliest appointment available at sickle cell clinic -  Home Care Screening Complete -  Medication Review (RN CM) Referral to Pharmacy -

## 2019-11-05 ENCOUNTER — Inpatient Hospital Stay: Payer: Self-pay

## 2019-11-05 LAB — COMPREHENSIVE METABOLIC PANEL
ALT: 23 U/L (ref 0–44)
AST: 25 U/L (ref 15–41)
Albumin: 2.4 g/dL — ABNORMAL LOW (ref 3.5–5.0)
Alkaline Phosphatase: 71 U/L (ref 38–126)
Anion gap: 10 (ref 5–15)
BUN: 7 mg/dL (ref 6–20)
CO2: 24 mmol/L (ref 22–32)
Calcium: 8.8 mg/dL — ABNORMAL LOW (ref 8.9–10.3)
Chloride: 103 mmol/L (ref 98–111)
Creatinine, Ser: 0.45 mg/dL (ref 0.44–1.00)
GFR calc Af Amer: >60 mL/min (ref >60)
GFR calc non Af Amer: >60 mL/min (ref >60)
Glucose, Bld: 103 mg/dL — ABNORMAL HIGH (ref 70–99)
Potassium: 4.3 mmol/L (ref 3.5–5.1)
Sodium: 137 mmol/L (ref 135–145)
Total Bilirubin: 0.4 mg/dL (ref 0.3–1.2)
Total Protein: 6.4 g/dL — ABNORMAL LOW (ref 6.5–8.1)

## 2019-11-05 LAB — VANCOMYCIN, PEAK: Vancomycin Pk: 55 ug/mL (ref 30–40)

## 2019-11-05 LAB — CBC
HCT: 31.6 % — ABNORMAL LOW (ref 36.0–46.0)
Hemoglobin: 10 g/dL — ABNORMAL LOW (ref 12.0–15.0)
MCH: 27.9 pg (ref 26.0–34.0)
MCHC: 31.6 g/dL (ref 30.0–36.0)
MCV: 88 fL (ref 80.0–100.0)
Platelets: 388 10*3/uL (ref 150–400)
RBC: 3.59 MIL/uL — ABNORMAL LOW (ref 3.87–5.11)
RDW: 14.2 % (ref 11.5–15.5)
WBC: 4.9 10*3/uL (ref 4.0–10.5)
nRBC: 0 % (ref 0.0–0.2)

## 2019-11-05 LAB — VANCOMYCIN, TROUGH: Vancomycin Tr: 11 ug/mL — ABNORMAL LOW (ref 15–20)

## 2019-11-05 LAB — MAGNESIUM: Magnesium: 1.9 mg/dL (ref 1.7–2.4)

## 2019-11-05 MED ORDER — VANCOMYCIN HCL IN DEXTROSE 1-5 GM/200ML-% IV SOLN
1000.0000 mg | Freq: Two times a day (BID) | INTRAVENOUS | Status: DC
  Administered 2019-11-05 – 2019-11-10 (×10): 1000 mg via INTRAVENOUS
  Filled 2019-11-05 (×11): qty 200

## 2019-11-05 NOTE — Progress Notes (Signed)
PROGRESS NOTE    Colleen Ramirez  EXB:[id number] DOB: 1980-09-21 DOA: 09/29/2019 PCP: Patient, No Pcp Per   Brief Narrative:  39 year old with history of obesity status post gastric bypass, homelessness, IV drug abuse-methamphetamine, anemia of chronic disease admitted for sharp neck pain.  Previously admitted here couple months ago for facial cellulitis requiring 10 days of doxycycline and topical clindamycin.  She underwent ACDF on 1/12 for MRSA epidural abscess requiring IV vancomycin and oral rifampin through 11/09/2019. She now has had worsening of C spine pathology DESPITE surgery and aggressive antibiotics with vancomycin and rifampin. Vancomycin AUC have been appropriate. Currently remaining inpatient to complete her treatment course.  Due to swelling of right upper extremity concerns for thrombophlebitis patient was transitioned to Lovenox treatment dosing.  Subjective.  Her right arm pain is improving.  She was worried about feeling a knot on her upper medial arm due to DVT, no erythema or edema.  She was also concerned about filtrating her IV daily and asking for a long-term solution as she is going to need IV antibiotics for a long time.  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 -Original plan plan to continue vancomycin IV/oral rifampin until 11/09/2019.  Followed by doxycycline for a month.  But now due to recurrent fever, she will need further 6 to 8 weeks of IV antibiotics followed by oral  antibiotics. New stop date for antibiotic is now 12/11/2019. -Per ID low threshold to repeat MRI of the neck as she is high risk for developing pyomyositis if she continued to have worsening neck pain. -Seen by ID and neurosurgery. -PICC line was ordered-IV team unable to put in PICC line due to her restricted left upper extremity  with DVT.  They were asking for IR guided PICC line which was ordered.  Right upper extremity swelling secondary to superficial vein thrombosis -Supportive care, pain control.  Elevate arm, compression dressing if necessary. -Patient is already been started on Lovenox every 12 hours.  History of chronic pain History of gastric bypass/Roux-en-Y -Continue Neurontin 100 mg 3 times daily -Extensive discussion with the patient and her family member regarding her pain medications, at this point I will discontinue oxycodone and transition her to p.o. Dilaudid.  I have clearly advised her that she will not be given IV pain medications unless clinically clearly indicated.  In the meantime we can titrate p.o. pain medications as deemed appropriate.  I explained her and the family members that I fear about narcotic overdose subsequently leading to respiratory failure. -Tylenol  History of depression/anxiety -Continue Adderall, Abilify and Valium  Tobacco use -Nicotine patch  Hepatitis C positive antibody -Follow-up with infectious disease  Anemia of chronic disease -Hemoglobin stable.   DVT prophylaxis: Lovenox Code Status: Full Family Communication: No family at bedside Disposition Plan:   Patient From= home  Patient Anticipated D/C place= Home  Barriers= maintain hospital stay at this time for IV antibiotics.  Given homelessness, lack of medical resources support  she will need to remain in the hospital until the completion of this.  Consultants:   Neurosurgery  Infectious disease  Procedures:   ACDF 1/12  Antimicrobials:   IV vancomycin  Oral  Rocephin  Review of Systems Otherwise negative except as per HPI, including: General: Denies fever, chills, night sweats or unintended weight loss. Resp: Denies cough, wheezing, shortness of breath. Cardiac: Denies chest pain, palpitations, orthopnea, paroxysmal nocturnal dyspnea. GI: Denies abdominal pain, nausea, vomiting, diarrhea or constipation GU: Denies dysuria, frequency, hesitancy or incontinence MS: Denies muscle aches, joint pain or swelling Neuro: Denies headache, neurologic deficits (focal weakness, numbness, tingling), abnormal gait Psych: Denies anxiety, depression, SI/HI/AVH Skin: Denies new rashes or lesions ID: Denies sick contacts, exotic exposures, travel  Examination:  General exam: Appears calm and comfortable  Respiratory system: Clear to auscultation. Respiratory effort normal. Cardiovascular system: S1 & S2 heard, RRR. No JVD, murmurs, rubs, gallops or clicks. No pedal edema. Gastrointestinal system: Abdomen is nondistended, soft and nontender. No organomegaly or masses felt. Normal bowel sounds heard. Central nervous system: Alert and oriented. No focal neurological deficits. Extremities: Right upper extremity slightly tender to touch especially in the upper arm area leading down to her forearm.  No obvious evidence of drainage.   Skin: No rashes, lesions or ulcers Psychiatry: Judgement and insight appear normal. Mood & affect appropriate.   Objective: Vitals:   11/04/19 1553 11/04/19 1942 11/05/19 0411 11/05/19 0900  BP: 110/78 117/73 118/78 111/76  Pulse: 97 99 90 (!) 108  Resp: 18 16 16    Temp: 98 F (36.7 C) 98.1 F (36.7 C) 97.9 F (36.6 C) 98.3 F (36.8 C)  TempSrc: Oral  Oral Oral  SpO2: 100% 100% 100% 100%  Weight:      Height:        Intake/Output Summary (Last 24 hours) at 11/05/2019 1535 Last data filed at 11/04/2019 1700 Gross per 24 hour  Intake 480 ml  Output 1 ml  Net 479 ml   Filed Weights   09/29/19 2032  Weight: 49.9 kg      Data Reviewed:   CBC: Recent Labs  Lab 11/02/19 1007 11/04/19 0542 11/05/19 0518  WBC 7.7 6.0 4.9  NEUTROABS 5.0 3.6  --   HGB 10.6* 10.5* 10.0*  HCT 32.4* 32.9* 31.6*  MCV 86.4 87.7 88.0  PLT 301 358 388   Basic Metabolic Panel: Recent Labs  Lab 10/31/19 0324 11/02/19 1007 11/04/19 0542 11/05/19 0518  NA 136 136 136 137  K 3.9 3.7 3.7 4.3  CL 102 103 104 103  CO2 24 23 21* 24  GLUCOSE 111* 77 185* 103*  BUN 8 6 7 7   CREATININE 0.70 0.51 0.63 0.45  CALCIUM 9.0 8.5* 8.6* 8.8*  MG  --   --  1.8 1.9   GFR: Estimated Creatinine Clearance: 74.4 mL/min (by C-G formula based on SCr of 0.45 mg/dL). Liver Function Tests: Recent Labs  Lab 10/31/19 0324 11/02/19 1007 11/04/19 0542 11/05/19 0518  AST 25 25 25 25   ALT 24 24 23 23   ALKPHOS 78 77 79 71  BILITOT 0.7 0.5 0.5 0.4  PROT 7.4 6.3* 6.9 6.4*  ALBUMIN 3.0* 2.5* 2.3* 2.4*   No results for input(s): LIPASE, AMYLASE in the last 168 hours. No results for input(s): AMMONIA in the last 168 hours. Coagulation Profile: No results for input(s): INR, PROTIME in the last 168 hours. Cardiac Enzymes: No results for input(s): CKTOTAL, CKMB, CKMBINDEX, TROPONINI in the last 168 hours. BNP (last  3 results) No results for input(s): PROBNP in the last 8760 hours. HbA1C: No results for input(s): HGBA1C in the last 72 hours. CBG: No results for input(s): GLUCAP in the last 168 hours. Lipid Profile: No results for input(s): CHOL, HDL, LDLCALC, TRIG, CHOLHDL, LDLDIRECT in the last 72 hours. Thyroid Function Tests: No results for input(s): TSH, T4TOTAL, FREET4, T3FREE, THYROIDAB in the last 72 hours. Anemia Panel: No results for input(s): VITAMINB12, FOLATE, FERRITIN, TIBC, IRON, RETICCTPCT in the last 72 hours. Sepsis Labs: No results for input(s): PROCALCITON, LATICACIDVEN in the last 168 hours.  Recent Results (from the past 240 hour(s))  Culture, blood (Routine X 2) w Reflex to ID Panel     Status: None (Preliminary  result)   Collection Time: 11/02/19 10:25 AM   Specimen: BLOOD  Result Value Ref Range Status   Specimen Description BLOOD LEFT ANTECUBITAL  Final   Special Requests   Final    BOTTLES DRAWN AEROBIC ONLY Blood Culture results may not be optimal due to an inadequate volume of blood received in culture bottles   Culture   Final    NO GROWTH 3 DAYS Performed at [facility], [address], [place], [place] [address]    Report Status PENDING  Incomplete  Culture, blood (Routine X 2) w Reflex to ID Panel     Status: None (Preliminary result)   Collection Time: 11/02/19 10:29 AM   Specimen: BLOOD  Result Value Ref Range Status   Specimen Description BLOOD LEFT ANTECUBITAL  Final   Special Requests   Final    BOTTLES DRAWN AEROBIC ONLY Blood Culture results may not be optimal due to an inadequate volume of blood received in culture bottles   Culture   Final    NO GROWTH 3 DAYS Performed at [facility], [address] [place], [place] [address]    Report Status PENDING  Incomplete     Radiology Studies: CT CHEST W CONTRAST  Result Date: 11/03/2019 CLINICAL DATA:  Epidural abscess of cervical spine, sepsis EXAM: CT CHEST WITH CONTRAST TECHNIQUE: Multidetector CT imaging of the chest was performed during intravenous contrast administration. CONTRAST:  82mL OMNIPAQUE IOHEXOL 300 MG/ML  SOLN COMPARISON:  MR cervical spine, 11/01/2019, chest radiographs, 11/01/2018 FINDINGS: Cardiovascular: There is extensive soft tissue stranding about the right brachial and axillary artery and vein in the partially included right upper extremity (series 3, image 53). Normal heart size. No pericardial effusion. Mediastinum/Nodes: No enlarged mediastinal, hilar, or axillary lymph nodes. Thyroid gland, trachea, and esophagus demonstrate no significant findings. Lungs/Pleura: Lungs are clear. No pleural effusion or pneumothorax. Upper Abdomen: No acute abnormality. Postoperative findings of partial  gastrectomy. Musculoskeletal: No chest wall mass or suspicious bone lesions identified. Prevertebral soft tissue stranding/phlegmon partially imaged in the lower neck, better assessed by prior MR of the cervical spine. IMPRESSION: 1. No acute intrathoracic abnormality. 2. Extensive soft tissue stranding about the right brachial and axillary artery and vein in the partially included right upper extremity, in keeping with known deep venous thrombosis. 3. Prevertebral soft tissue stranding/phlegmon, partially imaged in the lower neck, better assessed by prior MR cervical spine. Electronically Signed   By: Lauralyn Primes M.D.   On: 11/03/2019 20:50   [place] EKG SITE RITE  Result Date: 11/05/2019 If Site Rite image not attached, placement could not be confirmed due to current cardiac rhythm.   Scheduled Meds: . acetaminophen  1,000 mg Oral TID  . amphetamine-dextroamphetamine  30 mg Oral BID  .  ARIPiprazole  20 mg Oral Daily  . buPROPion  150 mg Oral Daily  . Chlorhexidine Gluconate Cloth  6 each Topical Q0600  . diclofenac Sodium  4 g Topical QID  . enoxaparin (LOVENOX) injection  50 mg Subcutaneous Q12H  . feeding supplement (ENSURE ENLIVE)  237 mL Oral BID BM  . gabapentin  100 mg Oral TID  . multivitamin with minerals  1 tablet Oral Daily  . nicotine  14 mg Transdermal Daily  . rifampin  300 mg Oral Q12H  . senna-docusate  1 tablet Oral BID  . sodium chloride flush  10-40 mL Intracatheter Q12H  . sodium chloride flush  3 mL Intravenous Q12H   Continuous Infusions: . sodium chloride Stopped (10/24/19 1012)  . sodium chloride 250 mL (11/02/19 0043)  . vancomycin       LOS: 37 days   Time spent= 35 mins   Lorella Nimrod, MD [facility]  If 7PM-7AM, please contact night-coverage  11/05/2019, 3:35 PM   This record has been created using Dragon voice recognition software. Errors have been sought and corrected,but may not always be located. Such creation errors do not reflect on the  standard of care.

## 2019-11-05 NOTE — Progress Notes (Signed)
PICC order.  Explained risk and benefits for PICC insertion to patient.  Patient very reluctant to proceed with insertion and refusing to give consent at this time.  Right arm restricted due to DVT.  Patient expressed concerns of risk of DVT with PICC due to DVT to left arm.  Noted report from 10/31/19.   If patient agrees to central line would be best to refer to interventional radiology as need more recovery time before placing PICC in left arm

## 2019-11-05 NOTE — Progress Notes (Signed)
Pharmacy Antibiotic Note  Colleen Ramirez is a 39 y.o. female admitted on 09/29/2019 with MRSA cervical spine infection. Pharmacy has been consulted for Vancomycin dosing. Also on rifampin. Noted that patient had a fever of 103 on 2/14 with some increased pain in her neck. Repeat MRI of her cervical spine showed some spread of her discitis into her thoracic spine.   Renal function remains stable. Vancomycin levels today came back with a peak of 55 ( appears to have been drawn appropriately) and a trough of 11 which results in an AUC of about 842 which is supratherapeutic.    Plan: - Reduce Vancomycin to 1000 mg every 12 hours  - Predicted AUC 559- Slightly above goal but ? accuracy of peak  - Rifampin 300mg  PO BID - Monitor renal fxn, clinical progress - Repeat levels at steady state    Height: 5\' 3"  (160 cm) Weight: 110 lb (49.9 kg) IBW/kg (Calculated) : 52.4  Temp (24hrs), Avg:98.1 F (36.7 C), Min:97.9 F (36.6 C), Max:98.3 F (36.8 C)  Recent Labs  Lab 10/30/19 1625 10/30/19 2113 10/31/19 0324 11/02/19 1007 11/04/19 0542 11/05/19 0518 11/05/19 1255  WBC  --   --   --  7.7 6.0 4.9  --   CREATININE  --   --  0.70 0.51 0.63 0.45  --   VANCOTROUGH  --  16  --   --   --   --  11*  VANCOPEAK 22*  --   --   --   --  55*  --     Estimated Creatinine Clearance: 74.4 mL/min (by C-G formula based on SCr of 0.45 mg/dL).    No Known Allergies  Antimicrobials this admission: Zosyn 1/12 >> 1/13 Vanc 1/12 >>  Rifampin 1/14 >>   Dose adjustments this admission: 1/16 & 1/17: 21 and <4 - 1g x 1 and adj to 750 mg/8h 1/19: VP 32, VT 11, AUC 521 - no change 1/22 VP/VT 29/14 AUC 542 - adjusted to 1000mg  q12h 1/26: VP/VT 24/9 AUC 420 - adjusted to 1250 mg q12h 2/2: VP 26, Vt11, AUC 480, Continue 1250mg  IV q12h 2/9: VP 20, VT 12 AUC 425 - adjusted to 1500mg  IV q12h 2/12: VP 22 (drawn late), VT 16, AUC 500 >> con't 1500mg  q12 2/18: VP 55 (appears drawn correctly), VT 11 AUC 852>>  Reduce to  1000 mg every 12 hours   Microbiology results: 1/12 abscess cx - MRSA  4/9, PharmD, BCPS, BCIDP Infectious Diseases Clinical Pharmacist Phone: [phone] 11/05/2019, 2:52 PM

## 2019-11-05 NOTE — Plan of Care (Signed)
°  Problem: Clinical Measurements: °Goal: Respiratory complications will improve °Outcome: Progressing °Goal: Cardiovascular complication will be avoided °Outcome: Progressing °  °Problem: Activity: °Goal: Risk for activity intolerance will decrease °Outcome: Progressing °  °Problem: Nutrition: °Goal: Adequate nutrition will be maintained °Outcome: Progressing °  °Problem: Pain Managment: °Goal: General experience of comfort will improve °Outcome: Progressing °  °

## 2019-11-05 NOTE — Progress Notes (Signed)
ANTICOAGULATION CONSULT NOTE  Pharmacy Consult for Lovenox Indication: DVT, RUE  No Known Allergies  Patient Measurements: Height: 5\' 3"  (160 cm) Weight: 110 lb (49.9 kg) IBW/kg (Calculated) : 52.4 Lovenox Dosing Weight: 49.9 kg  Vital Signs: Temp: 97.9 F (36.6 C) (02/18 0411) Temp Source: Oral (02/18 0411) BP: 118/78 (02/18 0411) Pulse Rate: 90 (02/18 0411)  Labs: Recent Labs    11/02/19 1007 11/02/19 1007 11/04/19 0542 11/05/19 0518  HGB 10.6*   < > 10.5* 10.0*  HCT 32.4*  --  32.9* 31.6*  PLT 301  --  358 388  CREATININE 0.51  --  0.63 0.45   < > = values in this interval not displayed.    Estimated Creatinine Clearance: 74.4 mL/min (by C-G formula based on SCr of 0.45 mg/dL).  Assessment: 38 yof continues on lovenox for a new DVT. CBC is stable and no bleeding noted.    Goal of Therapy:  Anti-Xa level 0.6-1 units/ml 4hrs after LMWH dose given Monitor platelets by anticoagulation protocol: Yes   Plan:  Continue Lovenox 50 mg SQ q12hrs. CBC Q72H F/u long-term AC plans  11/07/19, PharmD, BCPS Clinical Pharmacist Please see AMION for all pharmacy numbers 11/05/2019 8:27 AM

## 2019-11-05 NOTE — Progress Notes (Signed)
Patient vancomycin peak is 55. MD notified. Will continue to monitor patient.

## 2019-11-05 NOTE — Plan of Care (Signed)

## 2019-11-06 ENCOUNTER — Inpatient Hospital Stay (HOSPITAL_COMMUNITY): Payer: Self-pay

## 2019-11-06 HISTORY — PX: IR FLUORO GUIDE CV LINE RIGHT: IMG2283

## 2019-11-06 HISTORY — PX: IR US GUIDE VASC ACCESS RIGHT: IMG2390

## 2019-11-06 LAB — COMPREHENSIVE METABOLIC PANEL
ALT: 27 U/L (ref 0–44)
AST: 40 U/L (ref 15–41)
Albumin: 2.5 g/dL — ABNORMAL LOW (ref 3.5–5.0)
Alkaline Phosphatase: 85 U/L (ref 38–126)
Anion gap: 10 (ref 5–15)
BUN: 8 mg/dL (ref 6–20)
CO2: 27 mmol/L (ref 22–32)
Calcium: 9 mg/dL (ref 8.9–10.3)
Chloride: 102 mmol/L (ref 98–111)
Creatinine, Ser: 0.61 mg/dL (ref 0.44–1.00)
GFR calc Af Amer: >60 mL/min (ref >60)
GFR calc non Af Amer: >60 mL/min (ref >60)
Glucose, Bld: 103 mg/dL — ABNORMAL HIGH (ref 70–99)
Potassium: 4.7 mmol/L (ref 3.5–5.1)
Sodium: 139 mmol/L (ref 135–145)
Total Bilirubin: 0.3 mg/dL (ref 0.3–1.2)
Total Protein: 6.8 g/dL (ref 6.5–8.1)

## 2019-11-06 LAB — CBC
HCT: 32.7 % — ABNORMAL LOW (ref 36.0–46.0)
Hemoglobin: 10.4 g/dL — ABNORMAL LOW (ref 12.0–15.0)
MCH: 27.8 pg (ref 26.0–34.0)
MCHC: 31.8 g/dL (ref 30.0–36.0)
MCV: 87.4 fL (ref 80.0–100.0)
Platelets: 476 10*3/uL — ABNORMAL HIGH (ref 150–400)
RBC: 3.74 MIL/uL — ABNORMAL LOW (ref 3.87–5.11)
RDW: 14.1 % (ref 11.5–15.5)
WBC: 4.9 10*3/uL (ref 4.0–10.5)
nRBC: 0 % (ref 0.0–0.2)

## 2019-11-06 LAB — MAGNESIUM: Magnesium: 2 mg/dL (ref 1.7–2.4)

## 2019-11-06 MED ORDER — LIDOCAINE HCL (PF) 1 % IJ SOLN
INTRAMUSCULAR | Status: DC | PRN
  Administered 2019-11-06: 5 mL

## 2019-11-06 MED ORDER — LIDOCAINE HCL 1 % IJ SOLN
INTRAMUSCULAR | Status: AC
  Filled 2019-11-06: qty 20

## 2019-11-06 MED ORDER — FENTANYL CITRATE (PF) 100 MCG/2ML IJ SOLN
INTRAMUSCULAR | Status: AC
  Administered 2019-11-06: 10:00:00 75 ug via INTRAVENOUS
  Filled 2019-11-06: qty 2

## 2019-11-06 NOTE — Progress Notes (Signed)
Patient reported IV becoming reddened and burning with vancomycin administration. of administered.  MD notified and advised to hold remaining dose and wait for additional line placement today.

## 2019-11-06 NOTE — Progress Notes (Signed)
PROGRESS NOTE    Colleen Ramirez  ZYS:[id number] DOB: 12-21-1980 DOA: 09/29/2019 PCP: Patient, No Pcp Per   Brief Narrative:  40 year old with history of obesity status post gastric bypass, homelessness, IV drug abuse-methamphetamine, anemia of chronic disease admitted for sharp neck pain.  Previously admitted here couple months ago for facial cellulitis requiring 10 days of doxycycline and topical clindamycin.  She underwent ACDF on 1/12 for MRSA epidural abscess requiring IV vancomycin and oral rifampin through 11/09/2019. She now has had worsening of C spine pathology DESPITE surgery and aggressive antibiotics with vancomycin and rifampin. Vancomycin AUC have been appropriate. Currently remaining inpatient to complete her treatment course.  Due to swelling of right upper extremity concerns for thrombophlebitis patient was transitioned to Lovenox treatment dosing.  Subjective.  Her right arm pain is improving.  She was worried about feeling a knot on her upper medial arm due to DVT, no erythema or edema.  She was also concerned about filtrating her IV daily and asking for a long-term solution as she is going to need IV antibiotics for a long time.  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  Followed by doxycycline for a month.  But now due to recurrent fever, she  will need further 6 to 8 weeks of IV antibiotics followed by oral antibiotics.  Repeat CT chest on 11/03/2019 showed no new pathology.  No surgical indication at this time per neurosurgery. New stop date for antibiotic is now 12/11/2019.  If switch to oral anticoagulation for DVT, will need rifampicin switch to rifabutin per ID. -Per ID low threshold to repeat MRI of the neck as she is high risk for developing pyomyositis if she continued to have worsening neck pain. -Seen by ID and neurosurgery. -Right IJ power PICC line placed by IR on 11/06/2019 for long-term antibiotics  Right upper extremity swelling secondary to superficial vein thrombosis -Supportive care, pain control.  Elevate arm, compression dressing if necessary. -Patient is already been started on Lovenox every 12 hours.  Sinus tachycardia Likely in setting of anxiety.  All other vitals within normal limits. We will continue to monitor  History of chronic pain History of gastric bypass/Roux-en-Y -Continue Neurontin 100 mg 3 times daily -Previous provider had extensive discussion with patient and family regarding pain medications.  She was switched to p.o. Dilaudid 4 mg every 4 as needed for severe pain which she has been consistently using. -Tylenol  History of depression/anxiety -Continue Adderall, Abilify and Valium as needed  Tobacco use -Nicotine patch  Hepatitis C positive antibody -Follow-up with infectious disease  Anemia of chronic disease -Hemoglobin stable.   DVT prophylaxis: Lovenox therapeutic dose Code Status: Full Family Communication: Updated father Dominick at [phone] via telephone (consent obtained from patient who agreed) Disposition Plan:   Patient From= home  Patient Anticipated D/C place= Home  Barriers= maintain hospital stay at this  time for IV antibiotics.  Given homelessness, lack of medical resources support she will need to remain in the hospital until the completion of  this.  Consultants:   Neurosurgery  Infectious disease  Procedures:   ACDF 1/12  Right IJ PICC line placed 11/06/2019  Antimicrobials:  Anti-infectives (From admission, onward)   Start     Dose/Rate Route Frequency Ordered Stop   11/05/19 1600  vancomycin (VANCOCIN) IVPB 1000 mg/200 mL premix     1,000 mg 200 mL/hr over 60 Minutes Intravenous Every 12 hours 11/05/19 1504     11/01/19 1400  vancomycin (VANCOREADY) IVPB 1500 mg/300 mL  Status:  Discontinued     1,500 mg 150 mL/hr over 120 Minutes Intravenous Every 12 hours 11/01/19 1305 11/05/19 0823   10/28/19 1000  vancomycin (VANCOREADY) IVPB 1500 mg/300 mL  Status:  Discontinued     1,500 mg 150 mL/hr over 120 Minutes Intravenous Every 12 hours 10/27/19 2353 11/01/19 1305   10/14/19 1000  vancomycin (VANCOREADY) IVPB 1250 mg/250 mL  Status:  Discontinued     1,250 mg 166.7 mL/hr over 90 Minutes Intravenous Every 12 hours 10/14/19 0540 10/27/19 2353   10/13/19 0945  Vancomycin (VANCOCIN) 1,250 mg in sodium chloride 0.9 % 250 mL IVPB  Status:  Discontinued     1,250 mg 166.7 mL/hr over 90 Minutes Intravenous Every 12 hours 10/13/19 0935 10/14/19 0540   10/13/19 0930  Vancomycin (VANCOCIN) 1,250 mg in sodium chloride 0.9 % 250 mL IVPB  Status:  Discontinued     1,250 mg 166.7 mL/hr over 90 Minutes Intravenous Every 12 hours 10/13/19 0902 10/13/19 0934   10/09/19 2000  vancomycin (VANCOCIN) IVPB 1000 mg/200 mL premix  Status:  Discontinued     1,000 mg 200 mL/hr over 60 Minutes Intravenous Every 12 hours 10/09/19 1121 10/13/19 0902   10/04/19 2200  vancomycin (VANCOCIN) IVPB 750 mg/150 ml premix  Status:  Discontinued     750 mg 150 mL/hr over 60 Minutes Intravenous Every 8 hours 10/04/19 1305 10/09/19 1121   10/04/19 1400  vancomycin (VANCOCIN) IVPB 1000 mg/200 mL premix     1,000 mg 200 mL/hr over 60 Minutes Intravenous STAT 10/04/19 1305 10/04/19 1604   10/01/19 1100  rifampin (RIFADIN) capsule 300 mg     300 mg Oral  Every 12 hours 10/01/19 1056     09/30/19 1800  Vancomycin (VANCOCIN) 1,250 mg in sodium chloride 0.9 % 250 mL IVPB  Status:  Discontinued     1,250 mg 166.7 mL/hr over 90 Minutes Intravenous Every 24 hours 09/30/19 0003 09/30/19 0747   09/30/19 1800  vancomycin (VANCOREADY) IVPB 1250 mg/250 mL  Status:  Discontinued     1,250 mg 166.7 mL/hr over 90 Minutes Intravenous Every 24 hours 09/30/19 0746 10/04/19 1305   09/30/19 0400  piperacillin-tazobactam (ZOSYN) IVPB 3.375 g  Status:  Discontinued     3.375 g 12.5 mL/hr over 240 Minutes Intravenous Every 8 hours 09/30/19 0003 09/30/19 1533   09/29/19 2145  piperacillin-tazobactam (ZOSYN) IVPB 3.375 g     3.375 g 12.5 mL/hr over 240 Minutes Intravenous STAT 09/29/19 2137 09/29/19 2148   09/29/19 2119  bacitracin 50,000 Units in sodium chloride 0.9 % 500 mL irrigation  Status:  Discontinued       As needed 09/29/19 2119 09/29/19 2249   09/29/19 2033  vancomycin (VANCOCIN) 1-5 GM/200ML-% IVPB    Note to Pharmacy: Toney Sang   : cabinet override      09/29/19 2033 09/30/19  [id number]        Examination:  General exam: Appears anxious Respiratory system: Clear to auscultation. Respiratory effort normal. Cardiovascular system: S1 & S2 heard, regular, tachycardic. No pedal edema. Gastrointestinal system: Abdomen is nondistended, soft and nontender. Normal bowel sounds heard. Central nervous system: Alert and oriented. No focal neurological deficits. Extremities: Right upper extremity slightly tender to touch especially in the upper arm area leading down to her forearm.  No obvious evidence of drainage.   Musculoskeletal: Mobility in the neck reduced, mild tenderness mid upper thoracic region Skin: No rashes, lesions or ulcers, right IJ central line in place. Psychiatry: Anxious  Objective: Vitals:   11/06/19 0001 11/06/19 0300 11/06/19 0900 11/06/19 1500  BP: 130/78 132/77 130/88 (!) 105/53  Pulse: 90 98 (!) 108 (!) 113  Resp: 18 17 20  19   Temp: 98.6 F (37 C) 98 F (36.7 C) 97.9 F (36.6 C) 97.8 F (36.6 C)  TempSrc: Oral Oral Oral Oral  SpO2: 100% 100% 100% 100%  Weight:      Height:        Intake/Output Summary (Last 24 hours) at 11/06/2019 1712 Last data filed at 11/06/2019 0400 Gross per 24 hour  Intake 680 ml  Output --  Net 680 ml   Filed Weights   09/29/19 2032  Weight: 49.9 kg     Data Reviewed:   CBC: Recent Labs  Lab 11/02/19 1007 11/04/19 0542 11/05/19 0518 11/06/19 0433  WBC 7.7 6.0 4.9 4.9  NEUTROABS 5.0 3.6  --   --   HGB 10.6* 10.5* 10.0* 10.4*  HCT 32.4* 32.9* 31.6* 32.7*  MCV 86.4 87.7 88.0 87.4  PLT 301 358 388 476*   Basic Metabolic Panel: Recent Labs  Lab 10/31/19 0324 11/02/19 1007 11/04/19 0542 11/05/19 0518 11/06/19 0433  NA 136 136 136 137 139  K 3.9 3.7 3.7 4.3 4.7  CL 102 103 104 103 102  CO2 24 23 21* 24 27  GLUCOSE 111* 77 185* 103* 103*  BUN 8 6 7 7 8   CREATININE 0.70 0.51 0.63 0.45 0.61  CALCIUM 9.0 8.5* 8.6* 8.8* 9.0  MG  --   --  1.8 1.9 2.0   GFR: Estimated Creatinine Clearance: 74.4 mL/min (by C-G formula based on SCr of 0.61 mg/dL). Liver Function Tests: Recent Labs  Lab 10/31/19 0324 11/02/19 1007 11/04/19 0542 11/05/19 0518 11/06/19 0433  AST 25 25 25 25  40  ALT 24 24 23 23 27   ALKPHOS 78 77 79 71 85  BILITOT 0.7 0.5 0.5 0.4 0.3  PROT 7.4 6.3* 6.9 6.4* 6.8  ALBUMIN 3.0* 2.5* 2.3* 2.4* 2.5*   No results for input(s): LIPASE, AMYLASE in the last 168 hours. No results for input(s): AMMONIA in the last 168 hours. Coagulation Profile: No results for input(s): INR, PROTIME in the last 168 hours. Cardiac Enzymes: No results for input(s): CKTOTAL, CKMB, CKMBINDEX, TROPONINI in the last 168 hours. BNP (last 3 results) No results for input(s): PROBNP in the last 8760 hours. HbA1C: No results for input(s): HGBA1C in the last 72 hours. CBG: No results for input(s): GLUCAP in the last 168 hours. Lipid Profile: No results for input(s):  CHOL, HDL, LDLCALC, TRIG, CHOLHDL, LDLDIRECT in the last 72 hours. Thyroid Function Tests: No results for input(s): TSH, T4TOTAL, FREET4, T3FREE, THYROIDAB in the last 72 hours. Anemia Panel: No results for input(s): VITAMINB12, FOLATE, FERRITIN, TIBC, IRON, RETICCTPCT in the last 72 hours. Sepsis Labs: No results for input(s): PROCALCITON, LATICACIDVEN in  the last 168 hours.  Recent Results (from the past 240 hour(s))  Culture, blood (Routine X 2) w Reflex to ID Panel     Status: None (Preliminary result)   Collection Time: 11/02/19 10:25 AM   Specimen: BLOOD  Result Value Ref Range Status   Specimen Description BLOOD LEFT ANTECUBITAL  Final   Special Requests   Final    BOTTLES DRAWN AEROBIC ONLY Blood Culture results may not be optimal due to an inadequate volume of blood received in culture bottles   Culture   Final    NO GROWTH 4 DAYS Performed at [facility], [address] [place], [place] [address]    Report Status PENDING  Incomplete  Culture, blood (Routine X 2) w Reflex to ID Panel     Status: None (Preliminary result)   Collection Time: 11/02/19 10:29 AM   Specimen: BLOOD  Result Value Ref Range Status   Specimen Description BLOOD LEFT ANTECUBITAL  Final   Special Requests   Final    BOTTLES DRAWN AEROBIC ONLY Blood Culture results may not be optimal due to an inadequate volume of blood received in culture bottles   Culture   Final    NO GROWTH 4 DAYS Performed at [facility], [address] [place], [place] [address]    Report Status PENDING  Incomplete     Radiology Studies: IR Fluoro Guide CV Line Right  Result Date: 11/06/2019 INDICATION: IV drug use, infection, long-term access for antibiotics. EXAM: Ultrasound guidance for vascular access Right IJ double-lumen power PICC line MEDICATIONS: 1% lidocaine local ANESTHESIA/SEDATION: Fentanyl 100 mcg IV Moderate Sedation Time: None. The patient's level of consciousness and vital signs were monitored  continuously by radiology nursing throughout the procedure under my direct supervision. FLUOROSCOPY TIME:  Fluoroscopy Time: 0 minutes 12 seconds (1 mGy). COMPLICATIONS: None immediate. PROCEDURE: Informed written consent was obtained from the patient after a thorough discussion of the procedural risks, benefits and alternatives. All questions were addressed. Maximal Sterile Barrier Technique was utilized including caps, mask, sterile gowns, sterile gloves, sterile drape, hand hygiene and skin antiseptic. A timeout was performed prior to the initiation of the procedure. Under sterile conditions and local anesthesia, ultrasound micropuncture access performed of the patent right IJ vein. Images obtained for documentation of the patent right internal jugular vein. Guidewire advanced easily under fluoroscopy. Peel-away sheath inserted. Measurements obtained for the appropriate length. The double-lumen power PICC line was cut to 22 cm and advanced through the peel-away sheath to the SVC RA junction. Position confirmed with fluoroscopy. Catheter secured at the right IJ access site with Ethilon Prolene sutures. Blood aspirated easily followed by saline and heparin flushes. External caps applied followed by sterile dressing. No immediate complication. Patient tolerated the procedure well. IMPRESSION: Successful ultrasound and fluoroscopic right IJ double lumen power PICC line. Tip SVC RA junction. Ready for use. Electronically Signed   By: Judie Petit.  Shick M.D.   On: 11/06/2019 11:01   IR US Guide Vasc Access Right  Result Date: 11/06/2019 INDICATION: IV drug use, infection, long-term access for antibiotics. EXAM: Ultrasound guidance for vascular access Right IJ double-lumen power PICC line MEDICATIONS: 1% lidocaine local ANESTHESIA/SEDATION: Fentanyl 100 mcg IV Moderate Sedation Time: None. The patient's level of consciousness and vital signs were monitored continuously by radiology nursing throughout the procedure under my  direct supervision. FLUOROSCOPY TIME:  Fluoroscopy Time: 0 minutes 12 seconds (1 mGy). COMPLICATIONS: None immediate. PROCEDURE: Informed written consent was obtained from the  patient after a thorough discussion of the procedural risks, benefits and alternatives. All questions were addressed. Maximal Sterile Barrier Technique was utilized including caps, mask, sterile gowns, sterile gloves, sterile drape, hand hygiene and skin antiseptic. A timeout was performed prior to the initiation of the procedure. Under sterile conditions and local anesthesia, ultrasound micropuncture access performed of the patent right IJ vein. Images obtained for documentation of the patent right internal jugular vein. Guidewire advanced easily under fluoroscopy. Peel-away sheath inserted. Measurements obtained for the appropriate length. The double-lumen power PICC line was cut to 22 cm and advanced through the peel-away sheath to the SVC RA junction. Position confirmed with fluoroscopy. Catheter secured at the right IJ access site with Ethilon Prolene sutures. Blood aspirated easily followed by saline and heparin flushes. External caps applied followed by sterile dressing. No immediate complication. Patient tolerated the procedure well. IMPRESSION: Successful ultrasound and fluoroscopic right IJ double lumen power PICC line. Tip SVC RA junction. Ready for use. Electronically Signed   By: Judie Petit.  Shick M.D.   On: 11/06/2019 11:01   [place] EKG SITE RITE  Result Date: 11/05/2019 If Site Rite image not attached, placement could not be confirmed due to current cardiac rhythm.   Scheduled Meds: . acetaminophen  1,000 mg Oral TID  . amphetamine-dextroamphetamine  30 mg Oral BID  . ARIPiprazole  20 mg Oral Daily  . buPROPion  150 mg Oral Daily  . Chlorhexidine Gluconate Cloth  6 each Topical Q0600  . diclofenac Sodium  4 g Topical QID  . enoxaparin (LOVENOX) injection  50 mg Subcutaneous Q12H  . feeding supplement (ENSURE ENLIVE)  237 mL  Oral BID BM  . gabapentin  100 mg Oral TID  . lidocaine      . multivitamin with minerals  1 tablet Oral Daily  . nicotine  14 mg Transdermal Daily  . rifampin  300 mg Oral Q12H  . senna-docusate  1 tablet Oral BID  . sodium chloride flush  10-40 mL Intracatheter Q12H  . sodium chloride flush  3 mL Intravenous Q12H   Continuous Infusions: . sodium chloride Stopped (10/24/19 1012)  . sodium chloride 250 mL (11/02/19 0043)  . vancomycin 1,000 mg (11/06/19 1609)     LOS: 38 days   Time spent= 35 mins   Liborio Nixon, MD [facility]  If 7PM-7AM, please contact night-coverage  11/06/2019, 5:12 PM   This record has been created using Dragon voice recognition software. Errors have been sought and corrected,but may not always be located. Such creation errors do not reflect on the standard of care.

## 2019-11-06 NOTE — Progress Notes (Signed)
Per Dr. Denny Levy the patient will receive pain medication fentanyl for procedure in interventional radiology today. The patient received 75 mcg of fentanyl vitals remained stable. No complications.

## 2019-11-06 NOTE — Plan of Care (Signed)
  Problem: Clinical Measurements: Goal: Will remain free from infection Outcome: Progressing Goal: Diagnostic test results will improve Outcome: Progressing   Problem: Activity: Goal: Risk for activity intolerance will decrease Outcome: Progressing   Problem: Coping: Goal: Level of anxiety will decrease Outcome: Progressing   Problem: Elimination: Goal: Will not experience complications related to bowel motility Outcome: Progressing Goal: Will not experience complications related to urinary retention Outcome: Progressing   Problem: Pain Managment: Goal: General experience of comfort will improve Outcome: Progressing   Problem: Safety: Goal: Ability to remain free from injury will improve Outcome: Progressing   Problem: Skin Integrity: Goal: Risk for impaired skin integrity will decrease Outcome: Progressing

## 2019-11-07 ENCOUNTER — Inpatient Hospital Stay (HOSPITAL_COMMUNITY): Payer: Self-pay

## 2019-11-07 LAB — CULTURE, BLOOD (ROUTINE X 2)
Culture: NO GROWTH
Culture: NO GROWTH

## 2019-11-07 LAB — CBC
HCT: 35.2 % — ABNORMAL LOW (ref 36.0–46.0)
Hemoglobin: 11.1 g/dL — ABNORMAL LOW (ref 12.0–15.0)
MCH: 27.7 pg (ref 26.0–34.0)
MCHC: 31.5 g/dL (ref 30.0–36.0)
MCV: 87.8 fL (ref 80.0–100.0)
Platelets: 558 10*3/uL — ABNORMAL HIGH (ref 150–400)
RBC: 4.01 MIL/uL (ref 3.87–5.11)
RDW: 14 % (ref 11.5–15.5)
WBC: 6.8 10*3/uL (ref 4.0–10.5)
nRBC: 0 % (ref 0.0–0.2)

## 2019-11-07 LAB — URINALYSIS, ROUTINE W REFLEX MICROSCOPIC
Bilirubin Urine: NEGATIVE
Glucose, UA: NEGATIVE mg/dL
Hgb urine dipstick: NEGATIVE
Ketones, ur: NEGATIVE mg/dL
Leukocytes,Ua: NEGATIVE
Nitrite: NEGATIVE
Protein, ur: NEGATIVE mg/dL
Specific Gravity, Urine: 1.02 (ref 1.005–1.030)
pH: 6 (ref 5.0–8.0)

## 2019-11-07 LAB — MAGNESIUM: Magnesium: 2 mg/dL (ref 1.7–2.4)

## 2019-11-07 MED ORDER — GADOBUTROL 1 MMOL/ML IV SOLN
5.0000 mL | Freq: Once | INTRAVENOUS | Status: AC | PRN
  Administered 2019-11-07: 23:00:00 5 mL via INTRAVENOUS

## 2019-11-07 NOTE — Progress Notes (Signed)
Patient to MRI.

## 2019-11-07 NOTE — Progress Notes (Signed)
Orthopedic Tech Progress Note Patient Details:  Colleen Ramirez [birth year] [id number]  Ortho Devices Type of Ortho Device: Soft collar Ortho Device/Splint Location: neck Ortho Device/Splint Interventions: Application   Post Interventions Patient Tolerated: Well Instructions Provided: Care of device   Saul Fordyce 11/07/2019, 9:06 AM

## 2019-11-07 NOTE — Progress Notes (Addendum)
MEWS 2  Temp: 98.5 BP: 115/87 HR: 117 Resp: 16 SpO2: 100 on room air   Pain level 9/10 PRN Dilaudid administered @ 2058  Not an acute change   MD Bodenheimer notified   Will continue to monitor

## 2019-11-07 NOTE — Progress Notes (Addendum)
PROGRESS NOTE    Colleen Ramirez  WFU:[id number] DOB: 1981-03-06 DOA: 09/29/2019 PCP: Patient, No Pcp Per   Brief Narrative:  39 year old with history of obesity status post gastric bypass, homelessness, IV drug abuse-methamphetamine, anemia of chronic disease admitted for sharp neck pain.  Previously admitted here couple months ago for facial cellulitis requiring 10 days of doxycycline and topical clindamycin.  She underwent ACDF on 1/12 for MRSA epidural abscess requiring IV vancomycin and oral rifampin through 11/09/2019. She now has had worsening of C spine pathology DESPITE surgery and aggressive antibiotics with vancomycin and rifampin. Vancomycin AUC have been appropriate. Currently remaining inpatient to complete her treatment course.  Due to swelling of right upper extremity concerns for thrombophlebitis patient was transitioned to Lovenox treatment dosing.  Subjective.  Complains of more diffuse pain lower in the back which she did not have before.  Neck pain has improved.  Afebrile overnight and otherwise feels okay.  Very concerned about continuing the same antibiotic as she was told it did not work before.  Discussed no fever and normal white count are reassuring.  Assessment & Plan:   Principal Problem:   Abscess in epidural space of cervical spine Active Problems:   Depression   Acquired fusion of cervical spine   IVDU (intravenous drug user)   Homeless   S/P cervical discectomy   Anxiety   Status post gastric bypass for obesity   Hepatitis C antibody positive in blood   Acute bilateral thoracic back pain   Body aches   Methamphetamine abuse (HCC)   Pain of right sternoclavicular joint  MRSA cervical epidural abscess with discitis/vertebral osteomyelitis With background history of IV drug abuse, methamphetamine/cocaine -Status post C5-6 ACDF on 09/29/2019 -Original plan was to continue vancomycin IV/oral rifampin until 11/09/2019.  Followed by doxycycline for a month.  But  now due to recurrent fever, she will need further 6 to 8 weeks of IV antibiotics followed by oral antibiotics.  MRI thoracic spine on 11/01/2019 showed no evidence of discitis/osteomyelitis but showed ventral paravertebral phlegmon extending to T2 level with no definite abscess formed.  Repeat CT chest on 11/03/2019 showed no new pathology.  No surgical indication at this time per neurosurgery. New stop date for antibiotic is now 12/11/2019.  If switch to oral anticoagulation for superficial vein thrombi at, will need rifampicin switch to rifabutin per ID. -Per ID low threshold to repeat MRI of the neck as she is high risk for developing pyomyositis if she continued to have worsening neck pain. -Seen by ID and neurosurgery. -Right IJ power PICC line placed by IR on 11/06/2019 for long-term antibiotics -Given patient reporting mid lower back pain, tenderness will repeat MRI thoracic spine to rule out worsening pathology though no fever and normal white count are reassuring  Right upper extremity swelling secondary to superficial vein thrombosis -Supportive care, pain control.  Elevate arm, compression dressing if necessary. -Patient is already been started on Lovenox every 12 hours.  History of chronic pain History of gastric bypass/Roux-en-Y -Continue Neurontin 100 mg 3 times daily -Previous provider had extensive discussion with patient and family regarding pain medications.  She was switched to p.o. Dilaudid 4 mg every 4 as needed for severe pain which she has been consistently using. -Tylenol  History of depression/anxiety -Continue Adderall, Abilify and Valium as needed  Tobacco use -Nicotine patch  Hepatitis C positive antibody -Follow-up with infectious disease  Anemia of chronic disease -Hemoglobin stable.   DVT prophylaxis: Lovenox therapeutic dose Code Status: Full  Family Communication:  Disposition Plan:   Patient From= home  Patient Anticipated D/C place= Home  Barriers=  maintain hospital stay at this time for IV antibiotics.  Given homelessness, lack of medical resources support she will need to remain in the hospital until the completion of this.  Consultants:   Neurosurgery  Infectious disease  Procedures:   ACDF 1/12  Right IJ PICC line placed 11/06/2019  Antimicrobials:  Anti-infectives (From admission, onward)   Start     Dose/Rate Route Frequency Ordered Stop   11/05/19 1600  vancomycin (VANCOCIN) IVPB 1000 mg/200 mL premix     1,000 mg 200 mL/hr over 60 Minutes Intravenous Every 12 hours 11/05/19 1504     11/01/19 1400  vancomycin (VANCOREADY) IVPB 1500 mg/300 mL  Status:  Discontinued     1,500 mg 150 mL/hr over 120 Minutes Intravenous Every 12 hours 11/01/19 1305 11/05/19 0823   10/28/19 1000  vancomycin (VANCOREADY) IVPB 1500 mg/300 mL  Status:  Discontinued     1,500 mg 150 mL/hr over 120 Minutes Intravenous Every 12 hours 10/27/19 2353 11/01/19 1305   10/14/19 1000  vancomycin (VANCOREADY) IVPB 1250 mg/250 mL  Status:  Discontinued     1,250 mg 166.7 mL/hr over 90 Minutes Intravenous Every 12 hours 10/14/19 0540 10/27/19 2353   10/13/19 0945  Vancomycin (VANCOCIN) 1,250 mg in sodium chloride 0.9 % 250 mL IVPB  Status:  Discontinued     1,250 mg 166.7 mL/hr over 90 Minutes Intravenous Every 12 hours 10/13/19 0935 10/14/19 0540   10/13/19 0930  Vancomycin (VANCOCIN) 1,250 mg in sodium chloride 0.9 % 250 mL IVPB  Status:  Discontinued     1,250 mg 166.7 mL/hr over 90 Minutes Intravenous Every 12 hours 10/13/19 0902 10/13/19 0934   10/09/19 2000  vancomycin (VANCOCIN) IVPB 1000 mg/200 mL premix  Status:  Discontinued     1,000 mg 200 mL/hr over 60 Minutes Intravenous Every 12 hours 10/09/19 1121 10/13/19 0902   10/04/19 2200  vancomycin (VANCOCIN) IVPB 750 mg/150 ml premix  Status:  Discontinued     750 mg 150 mL/hr over 60 Minutes Intravenous Every 8 hours 10/04/19 1305 10/09/19 1121   10/04/19 1400  vancomycin (VANCOCIN) IVPB 1000  mg/200 mL premix     1,000 mg 200 mL/hr over 60 Minutes Intravenous STAT 10/04/19 1305 10/04/19 1604   10/01/19 1100  rifampin (RIFADIN) capsule 300 mg     300 mg Oral Every 12 hours 10/01/19 1056     09/30/19 1800  Vancomycin (VANCOCIN) 1,250 mg in sodium chloride 0.9 % 250 mL IVPB  Status:  Discontinued     1,250 mg 166.7 mL/hr over 90 Minutes Intravenous Every 24 hours 09/30/19 0003 09/30/19 0747   09/30/19 1800  vancomycin (VANCOREADY) IVPB 1250 mg/250 mL  Status:  Discontinued     1,250 mg 166.7 mL/hr over 90 Minutes Intravenous Every 24 hours 09/30/19 0746 10/04/19 1305   09/30/19 0400  piperacillin-tazobactam (ZOSYN) IVPB 3.375 g  Status:  Discontinued     3.375 g 12.5 mL/hr over 240 Minutes Intravenous Every 8 hours 09/30/19 0003 09/30/19 1533   09/29/19 2145  piperacillin-tazobactam (ZOSYN) IVPB 3.375 g     3.375 g 12.5 mL/hr over 240 Minutes Intravenous STAT 09/29/19 2137 09/29/19 2148   09/29/19 2119  bacitracin 50,000 Units in sodium chloride 0.9 % 500 mL irrigation  Status:  Discontinued       As needed 09/29/19 2119 09/29/19 2249   09/29/19 2033  vancomycin (VANCOCIN) 1-5  GM/200ML-% IVPB    Note to Pharmacy: Toney Sang   : cabinet override      09/29/19 2033 09/30/19 0844       Examination:  General exam: Appears anxious Respiratory system: Clear to auscultation. Respiratory effort normal. Cardiovascular system: S1 & S2 heard, regular, tachycardic. No pedal edema. Gastrointestinal system: Abdomen is nondistended, soft and nontender. Normal bowel sounds heard. Central nervous system: Alert and oriented. No focal neurological deficits. Extremities: Right upper arm indurated, left forearm with patchy induration consistent with superficial thrombophlebitis Musculoskeletal: Mobility in the neck reduced, tenderness mid upper thoracic region and paravertebral region Skin: No rashes, lesions or ulcers, right IJ central line in place. Psychiatry:  Anxious  Objective: Vitals:   11/06/19 2310 11/07/19 0300 11/07/19 0744 11/07/19 1143  BP: (!) 104/59 132/77 110/88 111/86  Pulse: 97 95 (!) 107 100  Resp: 16 15 18 18   Temp: 98.2 F (36.8 C) 98.1 F (36.7 C) 98.1 F (36.7 C) 97.9 F (36.6 C)  TempSrc: Oral Oral Oral Oral  SpO2: 100% 100% 100% 99%  Weight:      Height:        Intake/Output Summary (Last 24 hours) at 11/07/2019 1329 Last data filed at 11/07/2019 0900 Gross per 24 hour  Intake 720 ml  Output --  Net 720 ml   Filed Weights   09/29/19 2032  Weight: 49.9 kg     Data Reviewed:   CBC: Recent Labs  Lab 11/02/19 1007 11/04/19 0542 11/05/19 0518 11/06/19 0433 11/07/19 0904  WBC 7.7 6.0 4.9 4.9 6.8  NEUTROABS 5.0 3.6  --   --   --   HGB 10.6* 10.5* 10.0* 10.4* 11.1*  HCT 32.4* 32.9* 31.6* 32.7* 35.2*  MCV 86.4 87.7 88.0 87.4 87.8  PLT 301 358 388 476* 558*   Basic Metabolic Panel: Recent Labs  Lab 11/02/19 1007 11/04/19 0542 11/05/19 0518 11/06/19 0433 11/07/19 0904  NA 136 136 137 139  --   K 3.7 3.7 4.3 4.7  --   CL 103 104 103 102  --   CO2 23 21* 24 27  --   GLUCOSE 77 185* 103* 103*  --   BUN 6 7 7 8   --   CREATININE 0.51 0.63 0.45 0.61  --   CALCIUM 8.5* 8.6* 8.8* 9.0  --   MG  --  1.8 1.9 2.0 2.0   GFR: Estimated Creatinine Clearance: 74.4 mL/min (by C-G formula based on SCr of 0.61 mg/dL). Liver Function Tests: Recent Labs  Lab 11/02/19 1007 11/04/19 0542 11/05/19 0518 11/06/19 0433  AST 25 25 25  40  ALT 24 23 23 27   ALKPHOS 77 79 71 85  BILITOT 0.5 0.5 0.4 0.3  PROT 6.3* 6.9 6.4* 6.8  ALBUMIN 2.5* 2.3* 2.4* 2.5*   No results for input(s): LIPASE, AMYLASE in the last 168 hours. No results for input(s): AMMONIA in the last 168 hours. Coagulation Profile: No results for input(s): INR, PROTIME in the last 168 hours. Cardiac Enzymes: No results for input(s): CKTOTAL, CKMB, CKMBINDEX, TROPONINI in the last 168 hours. BNP (last 3 results) No results for input(s): PROBNP  in the last 8760 hours. HbA1C: No results for input(s): HGBA1C in the last 72 hours. CBG: No results for input(s): GLUCAP in the last 168 hours. Lipid Profile: No results for input(s): CHOL, HDL, LDLCALC, TRIG, CHOLHDL, LDLDIRECT in the last 72 hours. Thyroid Function Tests: No results for input(s): TSH, T4TOTAL, FREET4, T3FREE, THYROIDAB in the last 72 hours.  Anemia Panel: No results for input(s): VITAMINB12, FOLATE, FERRITIN, TIBC, IRON, RETICCTPCT in the last 72 hours. Sepsis Labs: No results for input(s): PROCALCITON, LATICACIDVEN in the last 168 hours.  Recent Results (from the past 240 hour(s))  Culture, blood (Routine X 2) w Reflex to ID Panel     Status: None   Collection Time: 11/02/19 10:25 AM   Specimen: BLOOD  Result Value Ref Range Status   Specimen Description BLOOD LEFT ANTECUBITAL  Final   Special Requests   Final    BOTTLES DRAWN AEROBIC ONLY Blood Culture results may not be optimal due to an inadequate volume of blood received in culture bottles Performed at [facility], [address] [place], [place] [address]    Culture NO GROWTH 5 DAYS  Final   Report Status 11/07/2019 FINAL  Final  Culture, blood (Routine X 2) w Reflex to ID Panel     Status: None   Collection Time: 11/02/19 10:29 AM   Specimen: BLOOD  Result Value Ref Range Status   Specimen Description BLOOD LEFT ANTECUBITAL  Final   Special Requests   Final    BOTTLES DRAWN AEROBIC ONLY Blood Culture results may not be optimal due to an inadequate volume of blood received in culture bottles Performed at [facility], [address] [place], [place] [address]    Culture NO GROWTH 5 DAYS  Final   Report Status 11/07/2019 FINAL  Final     Radiology Studies: IR Fluoro Guide CV Line Right  Result Date: 11/06/2019 INDICATION: IV drug use, infection, long-term access for antibiotics. EXAM: Ultrasound guidance for vascular access Right IJ double-lumen power PICC line MEDICATIONS: 1% lidocaine  local ANESTHESIA/SEDATION: Fentanyl 100 mcg IV Moderate Sedation Time: None. The patient's level of consciousness and vital signs were monitored continuously by radiology nursing throughout the procedure under my direct supervision. FLUOROSCOPY TIME:  Fluoroscopy Time: 0 minutes 12 seconds (1 mGy). COMPLICATIONS: None immediate. PROCEDURE: Informed written consent was obtained from the patient after a thorough discussion of the procedural risks, benefits and alternatives. All questions were addressed. Maximal Sterile Barrier Technique was utilized including caps, mask, sterile gowns, sterile gloves, sterile drape, hand hygiene and skin antiseptic. A timeout was performed prior to the initiation of the procedure. Under sterile conditions and local anesthesia, ultrasound micropuncture access performed of the patent right IJ vein. Images obtained for documentation of the patent right internal jugular vein. Guidewire advanced easily under fluoroscopy. Peel-away sheath inserted. Measurements obtained for the appropriate length. The double-lumen power PICC line was cut to 22 cm and advanced through the peel-away sheath to the SVC RA junction. Position confirmed with fluoroscopy. Catheter secured at the right IJ access site with Ethilon Prolene sutures. Blood aspirated easily followed by saline and heparin flushes. External caps applied followed by sterile dressing. No immediate complication. Patient tolerated the procedure well. IMPRESSION: Successful ultrasound and fluoroscopic right IJ double lumen power PICC line. Tip SVC RA junction. Ready for use. Electronically Signed   By: Judie Petit.  Shick M.D.   On: 11/06/2019 11:01   IR US Guide Vasc Access Right  Result Date: 11/06/2019 INDICATION: IV drug use, infection, long-term access for antibiotics. EXAM: Ultrasound guidance for vascular access Right IJ double-lumen power PICC line MEDICATIONS: 1% lidocaine local ANESTHESIA/SEDATION: Fentanyl 100 mcg IV Moderate Sedation  Time: None. The patient's level of consciousness and vital signs were monitored continuously by radiology nursing throughout the procedure under my direct supervision. FLUOROSCOPY TIME:  Fluoroscopy Time: 0  minutes 12 seconds (1 mGy). COMPLICATIONS: None immediate. PROCEDURE: Informed written consent was obtained from the patient after a thorough discussion of the procedural risks, benefits and alternatives. All questions were addressed. Maximal Sterile Barrier Technique was utilized including caps, mask, sterile gowns, sterile gloves, sterile drape, hand hygiene and skin antiseptic. A timeout was performed prior to the initiation of the procedure. Under sterile conditions and local anesthesia, ultrasound micropuncture access performed of the patent right IJ vein. Images obtained for documentation of the patent right internal jugular vein. Guidewire advanced easily under fluoroscopy. Peel-away sheath inserted. Measurements obtained for the appropriate length. The double-lumen power PICC line was cut to 22 cm and advanced through the peel-away sheath to the SVC RA junction. Position confirmed with fluoroscopy. Catheter secured at the right IJ access site with Ethilon Prolene sutures. Blood aspirated easily followed by saline and heparin flushes. External caps applied followed by sterile dressing. No immediate complication. Patient tolerated the procedure well. IMPRESSION: Successful ultrasound and fluoroscopic right IJ double lumen power PICC line. Tip SVC RA junction. Ready for use. Electronically Signed   By: Judie Petit.  Shick M.D.   On: 11/06/2019 11:01    Scheduled Meds: . acetaminophen  1,000 mg Oral TID  . amphetamine-dextroamphetamine  30 mg Oral BID  . ARIPiprazole  20 mg Oral Daily  . buPROPion  150 mg Oral Daily  . Chlorhexidine Gluconate Cloth  6 each Topical Q0600  . diclofenac Sodium  4 g Topical QID  . enoxaparin (LOVENOX) injection  50 mg Subcutaneous Q12H  . feeding supplement (ENSURE ENLIVE)  237  mL Oral BID BM  . gabapentin  100 mg Oral TID  . multivitamin with minerals  1 tablet Oral Daily  . nicotine  14 mg Transdermal Daily  . rifampin  300 mg Oral Q12H  . senna-docusate  1 tablet Oral BID  . sodium chloride flush  10-40 mL Intracatheter Q12H  . sodium chloride flush  3 mL Intravenous Q12H   Continuous Infusions: . sodium chloride Stopped (10/24/19 1012)  . sodium chloride 250 mL (11/02/19 0043)  . vancomycin 1,000 mg (11/07/19 0458)     LOS: 39 days   Time spent= 35 mins   Liborio Nixon, MD [facility]  If 7PM-7AM, please contact night-coverage  11/07/2019, 1:29 PM   This record has been created using Dragon voice recognition software. Errors have been sought and corrected,but may not always be located. Such creation errors do not reflect on the standard of care.

## 2019-11-08 LAB — CBC
HCT: 34.3 % — ABNORMAL LOW (ref 36.0–46.0)
Hemoglobin: 10.8 g/dL — ABNORMAL LOW (ref 12.0–15.0)
MCH: 27.8 pg (ref 26.0–34.0)
MCHC: 31.5 g/dL (ref 30.0–36.0)
MCV: 88.4 fL (ref 80.0–100.0)
Platelets: 547 10*3/uL — ABNORMAL HIGH (ref 150–400)
RBC: 3.88 MIL/uL (ref 3.87–5.11)
RDW: 13.9 % (ref 11.5–15.5)
WBC: 7 10*3/uL (ref 4.0–10.5)
nRBC: 0 % (ref 0.0–0.2)

## 2019-11-08 LAB — VANCOMYCIN, PEAK: Vancomycin Pk: 29 ug/mL — ABNORMAL LOW (ref 30–40)

## 2019-11-08 LAB — MAGNESIUM: Magnesium: 2.1 mg/dL (ref 1.7–2.4)

## 2019-11-08 NOTE — Progress Notes (Signed)
PROGRESS NOTE    Colleen Ramirez  ZOX:[id number] DOB: 01-28-1981 DOA: 09/29/2019 PCP: Patient, No Pcp Per   Brief Narrative:  39 year old with history of obesity status post gastric bypass, homelessness, IV drug abuse-methamphetamine, anemia of chronic disease admitted for sharp neck pain.  Previously admitted here couple months ago for facial cellulitis requiring 10 days of doxycycline and topical clindamycin.  She underwent ACDF on 1/12 for MRSA epidural abscess requiring IV vancomycin and oral rifampin through 11/09/2019. She now has had worsening of C spine pathology DESPITE surgery and aggressive antibiotics with vancomycin and rifampin. Vancomycin AUC have been appropriate. Currently remaining inpatient to complete her treatment course.  Due to swelling of right upper extremity concerns for thrombophlebitis patient was transitioned to Lovenox treatment dosing.  Subjective.  Afebrile.  Continues to report deeper sharp upper back pain including shoulders which is not tender on palpation.  Otherwise no new complaints. Requesting to see if subcutaneous Lovenox can be switched to oral.  Discussed will look for possible drug interactions and switch to oral.   Assessment & Plan:   Principal Problem:   Abscess in epidural space of cervical spine Active Problems:   Depression   Acquired fusion of cervical spine   IVDU (intravenous drug user)   Homeless   S/P cervical discectomy   Anxiety   Status post gastric bypass for obesity   Hepatitis C antibody positive in blood   Acute bilateral thoracic back pain   Body aches   Methamphetamine abuse (HCC)   Pain of right sternoclavicular joint  MRSA cervical epidural abscess with discitis/vertebral osteomyelitis With background history of IV drug abuse, methamphetamine/cocaine Afebrile, no leukocytosis  -Status post C5-6 ACDF on 09/29/2019 -Original plan was to continue vancomycin IV/oral rifampin until 11/09/2019.  Followed by doxycycline for a  month.  But now due to recurrent fever, she will need further 6 to 8 weeks of IV antibiotics followed by oral antibiotics.  MRI thoracic spine on 11/01/2019 showed no evidence of discitis/osteomyelitis but showed ventral paravertebral phlegmon extending to T2 level with no definite abscess formed.  Repeat CT chest on 11/03/2019 showed no new pathology.  No surgical indication at this time per neurosurgery. Repeat MRI thoracic spine done for reported worsening mid back pain 11/07/2019 showed no new pathology New stop date for antibiotic is now 12/11/2019.  -Per ID low threshold to repeat MRI of the neck as she is high risk for developing pyomyositis if she continued to have worsening neck pain. -Seen by ID and neurosurgery. -Right IJ power PICC line placed by IR on 11/06/2019 for long-term antibiotics - Tentative plan to switch to po anticoagulants, will need rifampicin switch to rifabutin but eliquis appears to have interaction with rifabutin too. Discussed with pharmacy. Awaiting a call back.   Right upper extremity swelling secondary to superficial vein thrombosis -Supportive care, pain control.  Elevate arm, compression dressing if necessary. -Patient is already been started on Lovenox every 12 hours.  History of chronic pain History of gastric bypass/Roux-en-Y -Continue Neurontin 100 mg 3 times daily -Previous provider had extensive discussion with patient and family regarding pain medications.  She was switched to p.o. Dilaudid 4 mg every 4 as needed for severe pain which she has been consistently using. -Tylenol  History of depression/anxiety -Continue Adderall, Abilify and Valium as needed  Tobacco use -Nicotine patch  Hepatitis C positive antibody -Follow-up with infectious disease  Anemia of chronic disease -Hemoglobin stable.   DVT prophylaxis: Lovenox therapeutic dose Code Status: Full Family  Communication:  Disposition Plan:   Patient From= home  Patient Anticipated D/C  place= Home  Barriers= maintain hospital stay at this time for IV antibiotics.  Given homelessness, lack of medical resources support she will need to remain in the hospital until the completion of this.  Consultants:   Neurosurgery  Infectious disease  Procedures:   ACDF 1/12  Right IJ PICC line placed 11/06/2019  Antimicrobials:  Anti-infectives (From admission, onward)   Start     Dose/Rate Route Frequency Ordered Stop   11/05/19 1600  vancomycin (VANCOCIN) IVPB 1000 mg/200 mL premix     1,000 mg 200 mL/hr over 60 Minutes Intravenous Every 12 hours 11/05/19 1504     11/01/19 1400  vancomycin (VANCOREADY) IVPB 1500 mg/300 mL  Status:  Discontinued     1,500 mg 150 mL/hr over 120 Minutes Intravenous Every 12 hours 11/01/19 1305 11/05/19 0823   10/28/19 1000  vancomycin (VANCOREADY) IVPB 1500 mg/300 mL  Status:  Discontinued     1,500 mg 150 mL/hr over 120 Minutes Intravenous Every 12 hours 10/27/19 2353 11/01/19 1305   10/14/19 1000  vancomycin (VANCOREADY) IVPB 1250 mg/250 mL  Status:  Discontinued     1,250 mg 166.7 mL/hr over 90 Minutes Intravenous Every 12 hours 10/14/19 0540 10/27/19 2353   10/13/19 0945  Vancomycin (VANCOCIN) 1,250 mg in sodium chloride 0.9 % 250 mL IVPB  Status:  Discontinued     1,250 mg 166.7 mL/hr over 90 Minutes Intravenous Every 12 hours 10/13/19 0935 10/14/19 0540   10/13/19 0930  Vancomycin (VANCOCIN) 1,250 mg in sodium chloride 0.9 % 250 mL IVPB  Status:  Discontinued     1,250 mg 166.7 mL/hr over 90 Minutes Intravenous Every 12 hours 10/13/19 0902 10/13/19 0934   10/09/19 2000  vancomycin (VANCOCIN) IVPB 1000 mg/200 mL premix  Status:  Discontinued     1,000 mg 200 mL/hr over 60 Minutes Intravenous Every 12 hours 10/09/19 1121 10/13/19 0902   10/04/19 2200  vancomycin (VANCOCIN) IVPB 750 mg/150 ml premix  Status:  Discontinued     750 mg 150 mL/hr over 60 Minutes Intravenous Every 8 hours 10/04/19 1305 10/09/19 1121   10/04/19 1400   vancomycin (VANCOCIN) IVPB 1000 mg/200 mL premix     1,000 mg 200 mL/hr over 60 Minutes Intravenous STAT 10/04/19 1305 10/04/19 1604   10/01/19 1100  rifampin (RIFADIN) capsule 300 mg     300 mg Oral Every 12 hours 10/01/19 1056     09/30/19 1800  Vancomycin (VANCOCIN) 1,250 mg in sodium chloride 0.9 % 250 mL IVPB  Status:  Discontinued     1,250 mg 166.7 mL/hr over 90 Minutes Intravenous Every 24 hours 09/30/19 0003 09/30/19 0747   09/30/19 1800  vancomycin (VANCOREADY) IVPB 1250 mg/250 mL  Status:  Discontinued     1,250 mg 166.7 mL/hr over 90 Minutes Intravenous Every 24 hours 09/30/19 0746 10/04/19 1305   09/30/19 0400  piperacillin-tazobactam (ZOSYN) IVPB 3.375 g  Status:  Discontinued     3.375 g 12.5 mL/hr over 240 Minutes Intravenous Every 8 hours 09/30/19 0003 09/30/19 1533   09/29/19 2145  piperacillin-tazobactam (ZOSYN) IVPB 3.375 g     3.375 g 12.5 mL/hr over 240 Minutes Intravenous STAT 09/29/19 2137 09/29/19 2148   09/29/19 2119  bacitracin 50,000 Units in sodium chloride 0.9 % 500 mL irrigation  Status:  Discontinued       As needed 09/29/19 2119 09/29/19 2249   09/29/19 2033  vancomycin (VANCOCIN) 1-5 GM/200ML-%  IVPB    Note to Pharmacy: Toney Sang   : cabinet override      09/29/19 2033 09/30/19 0844       Examination:  General exam: Appears anxious Respiratory system: Clear to auscultation. Respiratory effort normal. Cardiovascular system: S1 & S2 heard, regular, tachycardic. No pedal edema. Gastrointestinal system: Abdomen is nondistended, soft and nontender. Normal bowel sounds heard. Central nervous system: Alert and oriented. No focal neurological deficits. Extremities: Right upper arm indurated, left forearm with patchy induration consistent with superficial thrombophlebitis Musculoskeletal: Mobility in the neck reduced, tenderness mid upper thoracic region and paravertebral region Skin: No rashes, lesions or ulcers, right IJ central line in  place. Psychiatry: Anxious  Objective: Vitals:   11/08/19 0155 11/08/19 0454 11/08/19 0741 11/08/19 1551  BP: 101/75 99/77 (!) 116/94 112/80  Pulse: (!) 108 92 (!) 105 (!) 109  Resp: 16 16 17 17   Temp: 98 F (36.7 C) (!) 97.5 F (36.4 C) 98.1 F (36.7 C) 98.2 F (36.8 C)  TempSrc: Oral Oral Oral Oral  SpO2: 98% 98% (!) 88% 100%  Weight:      Height:        Intake/Output Summary (Last 24 hours) at 11/08/2019 1557 Last data filed at 11/08/2019 0900 Gross per 24 hour  Intake 360 ml  Output --  Net 360 ml   Filed Weights   09/29/19 2032  Weight: 49.9 kg     Data Reviewed:   CBC: Recent Labs  Lab 11/02/19 1007 11/02/19 1007 11/04/19 0542 11/05/19 0518 11/06/19 0433 11/07/19 0904 11/08/19 0407  WBC 7.7   < > 6.0 4.9 4.9 6.8 7.0  NEUTROABS 5.0  --  3.6  --   --   --   --   HGB 10.6*   < > 10.5* 10.0* 10.4* 11.1* 10.8*  HCT 32.4*   < > 32.9* 31.6* 32.7* 35.2* 34.3*  MCV 86.4   < > 87.7 88.0 87.4 87.8 88.4  PLT 301   < > 358 388 476* 558* 547*   < > = values in this interval not displayed.   Basic Metabolic Panel: Recent Labs  Lab 11/02/19 1007 11/04/19 0542 11/05/19 0518 11/06/19 0433 11/07/19 0904 11/08/19 0407  NA 136 136 137 139  --   --   K 3.7 3.7 4.3 4.7  --   --   CL 103 104 103 102  --   --   CO2 23 21* 24 27  --   --   GLUCOSE 77 185* 103* 103*  --   --   BUN 6 7 7 8   --   --   CREATININE 0.51 0.63 0.45 0.61  --   --   CALCIUM 8.5* 8.6* 8.8* 9.0  --   --   MG  --  1.8 1.9 2.0 2.0 2.1   GFR: Estimated Creatinine Clearance: 74.4 mL/min (by C-G formula based on SCr of 0.61 mg/dL). Liver Function Tests: Recent Labs  Lab 11/02/19 1007 11/04/19 0542 11/05/19 0518 11/06/19 0433  AST 25 25 25  40  ALT 24 23 23 27   ALKPHOS 77 79 71 85  BILITOT 0.5 0.5 0.4 0.3  PROT 6.3* 6.9 6.4* 6.8  ALBUMIN 2.5* 2.3* 2.4* 2.5*   No results for input(s): LIPASE, AMYLASE in the last 168 hours. No results for input(s): AMMONIA in the last 168  hours. Coagulation Profile: No results for input(s): INR, PROTIME in the last 168 hours. Cardiac Enzymes: No results for input(s): CKTOTAL, CKMB, CKMBINDEX,  TROPONINI in the last 168 hours. BNP (last 3 results) No results for input(s): PROBNP in the last 8760 hours. HbA1C: No results for input(s): HGBA1C in the last 72 hours. CBG: No results for input(s): GLUCAP in the last 168 hours. Lipid Profile: No results for input(s): CHOL, HDL, LDLCALC, TRIG, CHOLHDL, LDLDIRECT in the last 72 hours. Thyroid Function Tests: No results for input(s): TSH, T4TOTAL, FREET4, T3FREE, THYROIDAB in the last 72 hours. Anemia Panel: No results for input(s): VITAMINB12, FOLATE, FERRITIN, TIBC, IRON, RETICCTPCT in the last 72 hours. Sepsis Labs: No results for input(s): PROCALCITON, LATICACIDVEN in the last 168 hours.  Recent Results (from the past 240 hour(s))  Culture, blood (Routine X 2) w Reflex to ID Panel     Status: None   Collection Time: 11/02/19 10:25 AM   Specimen: BLOOD  Result Value Ref Range Status   Specimen Description BLOOD LEFT ANTECUBITAL  Final   Special Requests   Final    BOTTLES DRAWN AEROBIC ONLY Blood Culture results may not be optimal due to an inadequate volume of blood received in culture bottles Performed at [facility], [address], [place], [place] [address]    Culture NO GROWTH 5 DAYS  Final   Report Status 11/07/2019 FINAL  Final  Culture, blood (Routine X 2) w Reflex to ID Panel     Status: None   Collection Time: 11/02/19 10:29 AM   Specimen: BLOOD  Result Value Ref Range Status   Specimen Description BLOOD LEFT ANTECUBITAL  Final   Special Requests   Final    BOTTLES DRAWN AEROBIC ONLY Blood Culture results may not be optimal due to an inadequate volume of blood received in culture bottles Performed at [facility], [address] [place], [place] [address]    Culture NO GROWTH 5 DAYS  Final   Report Status 11/07/2019 FINAL  Final      Radiology Studies: MR THORACIC SPINE W WO CONTRAST  Result Date: 11/08/2019 CLINICAL DATA:  History of cervical discitis and osteomyelitis. Worsening thoracic region back pain. IV drug use. EXAM: MRI THORACIC WITHOUT AND WITH CONTRAST TECHNIQUE: Multiplanar and multiecho pulse sequences of the thoracic spine were obtained without and with intravenous contrast. CONTRAST:  50mL GADAVIST GADOBUTROL 1 MMOL/ML IV SOLN COMPARISON:  11/01/2019 FINDINGS: MRI THORACIC SPINE FINDINGS Alignment:  No significant malalignment. Vertebrae: No thoracic vertebral body abnormality. Redemonstration of an incidental hemangioma at T4 affecting the pedicle, articular processes and transverse process. This is not of any clinical significance. Cord:  Normal Paraspinal and other soft tissues: Anterior paraspinous inflammatory changes extending from the cervical region down as far as upper T2 as seen previously. No evidence of mature abscess. No paravertebral abnormality seen in the chest. Disc levels: No evidence of thoracic region discitis osteomyelitis. No convincing epidural inflammatory change. Certainly there is no epidural abscess. No evidence of discitis or septic facet arthritis in the thoracic region. Shallow disc protrusions without neural compression on the right at T4-5 central at T7-8, central at T8-9, and shallow broad-based at T11-12. These are unchanged. IMPRESSION: Patient has had interval surgery in the mid cervical region, not primarily evaluated. As seen previously, there is some anterior paraspinous inflammatory change extending from the cervical region down as far as T2 without evidence of a mature or drainable paraspinal abscess in that region. The cervical area was not imaged other than scout imaging. No evidence of discitis osteomyelitis in the thoracic spine. No evidence of epidural inflammation or  abscess at this time. Mild chronic degenerative disc disease is stable as outlined above. Electronically Signed    By: Paulina Fusi M.D.   On: 11/08/2019 00:07    Scheduled Meds: . acetaminophen  1,000 mg Oral TID  . amphetamine-dextroamphetamine  30 mg Oral BID  . ARIPiprazole  20 mg Oral Daily  . buPROPion  150 mg Oral Daily  . Chlorhexidine Gluconate Cloth  6 each Topical Q0600  . diclofenac Sodium  4 g Topical QID  . enoxaparin (LOVENOX) injection  50 mg Subcutaneous Q12H  . feeding supplement (ENSURE ENLIVE)  237 mL Oral BID BM  . gabapentin  100 mg Oral TID  . multivitamin with minerals  1 tablet Oral Daily  . nicotine  14 mg Transdermal Daily  . rifampin  300 mg Oral Q12H  . senna-docusate  1 tablet Oral BID  . sodium chloride flush  10-40 mL Intracatheter Q12H  . sodium chloride flush  3 mL Intravenous Q12H   Continuous Infusions: . sodium chloride Stopped (10/24/19 1012)  . sodium chloride 250 mL (11/02/19 0043)  . vancomycin 1,000 mg (11/08/19 0352)     LOS: 40 days   Time spent= 35 mins   Liborio Nixon, MD [facility]  If 7PM-7AM, please contact night-coverage  11/08/2019, 3:57 PM   This record has been created using Dragon voice recognition software. Errors have been sought and corrected,but may not always be located. Such creation errors do not reflect on the standard of care.

## 2019-11-08 NOTE — Progress Notes (Signed)
ANTICOAGULATION CONSULT NOTE  Pharmacy Consult for Lovenox Indication: DVT, RUE  No Known Allergies  Patient Measurements: Height: 5\' 3"  (160 cm) Weight: 110 lb (49.9 kg) IBW/kg (Calculated) : 52.4 Lovenox Dosing Weight: 49.9 kg  Vital Signs: Temp: 97.5 F (36.4 C) (02/21 0454) Temp Source: Oral (02/21 0454) BP: 99/77 (02/21 0454) Pulse Rate: 92 (02/21 0454)  Labs: Recent Labs    11/06/19 0433 11/06/19 0433 11/07/19 0904 11/08/19 0407  HGB 10.4*   < > 11.1* 10.8*  HCT 32.7*  --  35.2* 34.3*  PLT 476*  --  558* 547*  CREATININE 0.61  --   --   --    < > = values in this interval not displayed.    Estimated Creatinine Clearance: 74.4 mL/min (by C-G formula based on SCr of 0.61 mg/dL).  Assessment: 38 yof continues on lovenox for a new DVT. CBC is stable and no bleeding noted.    Goal of Therapy:  Anti-Xa level 0.6-1 units/ml 4hrs after LMWH dose given Monitor platelets by anticoagulation protocol: Yes   Plan:  Continue Lovenox 50 mg SQ q12hrs. CBC Q72H F/u long-term AC plans  Thank you for the interesting consult and for involving pharmacy in this patient's care.  11/10/19, PharmD, BCPS 11/08/2019 7:17 AM PGY-2 Pharmacy Administration Resident Please check [email or web address] for unit-specific pharmacist phone numbers

## 2019-11-08 NOTE — Plan of Care (Signed)
  Problem: Clinical Measurements: Goal: Will remain free from infection Outcome: Progressing Goal: Diagnostic test results will improve Outcome: Progressing Goal: Respiratory complications will improve Outcome: Progressing   Problem: Nutrition: Goal: Adequate nutrition will be maintained Outcome: Progressing   Problem: Coping: Goal: Level of anxiety will decrease Outcome: Progressing   Problem: Pain Managment: Goal: General experience of comfort will improve Outcome: Progressing   Problem: Safety: Goal: Ability to remain free from injury will improve Outcome: Progressing   Problem: Skin Integrity: Goal: Risk for impaired skin integrity will decrease Outcome: Progressing

## 2019-11-08 NOTE — Plan of Care (Signed)
  Problem: Clinical Measurements: Goal: Will remain free from infection 11/08/2019 0145 by Waldon Merl, RN Outcome: Progressing 11/08/2019 0144 by Waldon Merl, RN Outcome: Progressing Goal: Diagnostic test results will improve 11/08/2019 0145 by Waldon Merl, RN Outcome: Progressing 11/08/2019 0144 by Waldon Merl, RN Outcome: Progressing Goal: Respiratory complications will improve Outcome: Progressing   Problem: Activity: Goal: Risk for activity intolerance will decrease Outcome: Progressing   Problem: Nutrition: Goal: Adequate nutrition will be maintained 11/08/2019 0145 by Waldon Merl, RN Outcome: Progressing 11/08/2019 0144 by Waldon Merl, RN Outcome: Progressing   Problem: Coping: Goal: Level of anxiety will decrease 11/08/2019 0145 by Waldon Merl, RN Outcome: Progressing 11/08/2019 0144 by Waldon Merl, RN Outcome: Progressing   Problem: Elimination: Goal: Will not experience complications related to bowel motility Outcome: Progressing   Problem: Pain Managment: Goal: General experience of comfort will improve 11/08/2019 0145 by Waldon Merl, RN Outcome: Progressing 11/08/2019 0144 by Waldon Merl, RN Outcome: Progressing   Problem: Safety: Goal: Ability to remain free from injury will improve Outcome: Progressing   Problem: Skin Integrity: Goal: Risk for impaired skin integrity will decrease Outcome: Progressing

## 2019-11-08 NOTE — Progress Notes (Signed)
Pharmacy Antibiotic Note  Colleen Ramirez is a 39 y.o. female admitted on 09/29/2019 with MRSA cervical spine infection. Pharmacy has been consulted for Vancomycin dosing. Also on rifampin. Noted that patient had a fever of 103 on 2/14 with some increased pain in her neck. Repeat MRI of her cervical spine showed some spread of her discitis into her thoracic spine.   Patient is afebrile, wbc 7.0, HR and RR stable. Renal function remains stable. Looks like to have a stop date of antibiotics of 12/11/19.   Plan: - Continue Vancomycin to 1000 mg every 12 hours  - Rifampin 300mg  PO BID - Monitor renal fxn, clinical progress - Planned vancomycin levels at 1800 tonight (2/21) and 0330 (2/22) tomorrow.   Height: 5\' 3"  (160 cm) Weight: 110 lb (49.9 kg) IBW/kg (Calculated) : 52.4  Temp (24hrs), Avg:98.1 F (36.7 C), Min:97.5 F (36.4 C), Max:98.6 F (37 C)  Recent Labs  Lab 11/02/19 1007 11/02/19 1007 11/04/19 0542 11/05/19 0518 11/05/19 1255 11/06/19 0433 11/07/19 0904 11/08/19 0407  WBC 7.7   < > 6.0 4.9  --  4.9 6.8 7.0  CREATININE 0.51  --  0.63 0.45  --  0.61  --   --   VANCOTROUGH  --   --   --   --  11*  --   --   --   VANCOPEAK  --   --   --  55*  --   --   --   --    < > = values in this interval not displayed.    Estimated Creatinine Clearance: 74.4 mL/min (by C-G formula based on SCr of 0.61 mg/dL).    No Known Allergies  Antimicrobials this admission: Zosyn 1/12 >> 1/13 Vanc 1/12 >> (3/26) Rifampin 1/14 >> (3/26)   Dose adjustments this admission: 1/16 & 1/17: 21 and <4 - 1g x 1 and adj to 750 mg/8h 1/19: VP 32, VT 11, AUC 521 - no change 1/22 VP/VT 29/14 AUC 542 - adjusted to 1000mg  q12h 1/26: VP/VT 24/9 AUC 420 - adjusted to 1250 mg q12h 2/2: VP 26, Vt11, AUC 480, Continue 1250mg  IV q12h 2/9: VP 20, VT 12 AUC 425 - adjusted to 1500mg  IV q12h 2/12: VP 22 (drawn late), VT 16, AUC 500 >> con't 1500mg  q12 2/18: VP 55 (appears drawn correctly), VT 11 AUC 852>>  Reduce to  1000 mg every 12 hours   Microbiology results: 1/12 abscess cx - MRSA  Thank you for the interesting consult and for involving pharmacy in this patient's care.  4/9, PharmD, BCPS 11/08/2019 7:14 AM PGY-2 Pharmacy Administration Resident Please check [email or web address] for unit-specific pharmacist phone numbers

## 2019-11-08 NOTE — Progress Notes (Signed)
Patient noted to be exiting bathroom upon my entry into room with IV fluids running on pump but disconnected from central line.  Bedside RN at door way and asked who unhooked the line.  Patient stated that she had unhooked.  I requested that she not rehook at this time and not to [facility] in future.  Bedside RN stated that she would change the lines and reconnect.  Old IV fluids and tubings placed in trash.  Dressing to central line due to being wet.

## 2019-11-09 LAB — BASIC METABOLIC PANEL
Anion gap: 9 (ref 5–15)
BUN: 11 mg/dL (ref 6–20)
CO2: 28 mmol/L (ref 22–32)
Calcium: 9 mg/dL (ref 8.9–10.3)
Chloride: 98 mmol/L (ref 98–111)
Creatinine, Ser: 0.59 mg/dL (ref 0.44–1.00)
GFR calc Af Amer: >60 mL/min (ref >60)
GFR calc non Af Amer: >60 mL/min (ref >60)
Glucose, Bld: 108 mg/dL — ABNORMAL HIGH (ref 70–99)
Potassium: 4.1 mmol/L (ref 3.5–5.1)
Sodium: 135 mmol/L (ref 135–145)

## 2019-11-09 LAB — CBC
HCT: 32.8 % — ABNORMAL LOW (ref 36.0–46.0)
Hemoglobin: 10.3 g/dL — ABNORMAL LOW (ref 12.0–15.0)
MCH: 27.7 pg (ref 26.0–34.0)
MCHC: 31.4 g/dL (ref 30.0–36.0)
MCV: 88.2 fL (ref 80.0–100.0)
Platelets: 533 10*3/uL — ABNORMAL HIGH (ref 150–400)
RBC: 3.72 MIL/uL — ABNORMAL LOW (ref 3.87–5.11)
RDW: 13.8 % (ref 11.5–15.5)
WBC: 7 10*3/uL (ref 4.0–10.5)
nRBC: 0 % (ref 0.0–0.2)

## 2019-11-09 LAB — VANCOMYCIN, PEAK
Vancomycin Pk: 7 ug/mL — ABNORMAL LOW (ref 30–40)
Vancomycin Pk: 17 ug/mL — ABNORMAL LOW (ref 30–40)

## 2019-11-09 LAB — C-REACTIVE PROTEIN: CRP: 3.2 mg/dL — ABNORMAL HIGH (ref <1.0)

## 2019-11-09 MED ORDER — RIVAROXABAN 20 MG PO TABS
20.0000 mg | ORAL_TABLET | Freq: Every day | ORAL | Status: DC
  Administered 2019-11-24 – 2019-12-11 (×18): 20 mg via ORAL
  Filled 2019-11-09 (×18): qty 1

## 2019-11-09 MED ORDER — RIVAROXABAN 15 MG PO TABS
15.0000 mg | ORAL_TABLET | Freq: Two times a day (BID) | ORAL | Status: AC
  Administered 2019-11-09 – 2019-11-23 (×29): 15 mg via ORAL
  Filled 2019-11-09 (×29): qty 1

## 2019-11-09 MED ORDER — RIFABUTIN 150 MG PO CAPS
300.0000 mg | ORAL_CAPSULE | Freq: Two times a day (BID) | ORAL | Status: DC
  Filled 2019-11-09: qty 2

## 2019-11-09 MED ORDER — RIFABUTIN 150 MG PO CAPS
300.0000 mg | ORAL_CAPSULE | Freq: Every day | ORAL | Status: DC
  Administered 2019-11-09 – 2019-12-10 (×32): 300 mg via ORAL
  Filled 2019-11-09 (×33): qty 2

## 2019-11-09 NOTE — Progress Notes (Addendum)
Occupational Therapy Treatment Patient Details Name: Colleen Ramirez MRN: [id number] DOB: 1981-04-15 Today's Date: 11/09/2019    History of present illness Colleen Ramirez is a 39 y.o. F admitted to the ED with c/o severe neck pain secondary to a cervical epidural abscess. She is s/p C5-6 anterior cervical discectomy and instrumented fusion. PMH: IVDU, anemia, anxiety, depression, recent admission for facial cellulitis.    OT comments  Pt agreeable to OT session. She reports she has not been compliant with her HEP program. Continued to educate pt on importance of compliance with HEP. She continues to demonstrate decreased fine motor coordination, left worse than right. Completed fine motor exercises and exercises with level I theraband. Pt will continue to benefit from skilled OT services to maximize safety and independence with ADL/IADL and functional mobility. Will continue to follow acutely and progress as tolerated.    Follow Up Recommendations  No OT follow up    Equipment Recommendations  None recommended by OT    Recommendations for Other Services      Precautions / Restrictions Precautions Precautions: Cervical Precaution Booklet Issued: Yes (comment) Precaution Comments: reviewed cervical precautions with pt, pt with brace off upon arrival Required Braces or Orthoses: Cervical Brace Cervical Brace: Soft collar;For comfort Restrictions Weight Bearing Restrictions: No Other Position/Activity Restrictions: pt with brace off upon arrival       Mobility Bed Mobility Overal bed mobility: Independent                Transfers                      Balance Overall balance assessment: Independent                                         ADL either performed or assessed with clinical judgement   ADL                                               Vision       Perception     Praxis      Cognition Arousal/Alertness:  Awake/alert Behavior During Therapy: WFL for tasks assessed/performed Overall Cognitive Status: Within Functional Limits for tasks assessed                                 General Comments: pt pleasant and conversant, good safety awareness        Exercises Exercises: Other exercises;Hand activities Hand Exercises Digit Composite Flexion: AROM;Both;10 reps;Seated Composite Extension: AROM;Both;10 reps;Seated Digit Lifts: AROM;Both;10 reps;Seated Other Exercises Other Exercises: Pt continues to admit that she has not been consistent with UE HEP, continued to educate pt on importance of compliance with HEP;pt unable to demonstrate exercises in HEP Other Exercises: Pt completed bicep curl with theraband (Level 1) Other Exercises: Wrist flexion/extension x10 bilateral wrists Other Exercises: Educated pt on importance of upright posture with scapular retraction. Other Exercises: Pulled coins out of theraputty   Shoulder Instructions       General Comments pt complaining of swelling in chest, RN arrived to evaluate.    Pertinent Vitals/ Pain       Pain Assessment: 0-10 Pain Score: 6  Pain Location: right shoulder/arm  and neck Pain Descriptors / Indicators: Discomfort;Sore;Aching Pain Intervention(s): Limited activity within patient's tolerance;Monitored during session  Home Living                                          Prior Functioning/Environment              Frequency  Min 1X/week        Progress Toward Goals  OT Goals(current goals can now be found in the care plan section)  Progress towards OT goals: Progressing toward goals  Acute Rehab OT Goals Patient Stated Goal: decrease pain OT Goal Formulation: With patient Time For Goal Achievement: 11/18/19 Potential to Achieve Goals: Good ADL Goals Pt Will Perform Grooming: with modified independence;Independently Pt Will Perform Upper Body Dressing: with modified  independence;Independently Pt Will Perform Lower Body Dressing: with modified independence;Independently Pt Will Transfer to Toilet: with modified independence;Independently Pt Will Perform Toileting - Clothing Manipulation and hygiene: with modified independence;Independently Additional ADL Goal #1: Pt will be able to perform Bil UE exercises with LEVEL 1 theraband independently (bicep curl, horizontal add/abduction (bil and unilateral)) Additional ADL Goal #2: Pt will be independent in theraputty yellow sequencing activities Additional ADL Goal #3: Pt will be independent in Bil shoulder and wrist strengthening exercies/activities  Plan Frequency remains appropriate    Co-evaluation                 AM-PAC OT "6 Clicks" Daily Activity     Outcome Measure   Help from another person eating meals?: None Help from another person taking care of personal grooming?: None Help from another person toileting, which includes using toliet, bedpan, or urinal?: None Help from another person bathing (including washing, rinsing, drying)?: None Help from another person to put on and taking off regular upper body clothing?: None Help from another person to put on and taking off regular lower body clothing?: None 6 Click Score: 24    End of Session    OT Visit Diagnosis: Muscle weakness (generalized) (M62.81);Pain Pain - Right/Left: Right Pain - part of body: Shoulder;Arm   Activity Tolerance Patient tolerated treatment well   Patient Left in bed   Nurse Communication Mobility status        Time: 9509-3267 OT Time Calculation (min): 27 min  Charges: OT General Charges $OT Visit: 1 Visit OT Treatments $Therapeutic Exercise: 23-37 mins  Diona Browner OTR/L Acute Rehabilitation Services Office: [phone]    Rebeca Alert 11/09/2019, 1:53 PM

## 2019-11-09 NOTE — Plan of Care (Signed)
  Problem: Clinical Measurements: Goal: Will remain free from infection Outcome: Progressing Goal: Respiratory complications will improve Outcome: Progressing   Problem: Activity: Goal: Risk for activity intolerance will decrease Outcome: Progressing   Problem: Coping: Goal: Level of anxiety will decrease Outcome: Progressing   Problem: Pain Managment: Goal: General experience of comfort will improve Outcome: Progressing   Problem: Safety: Goal: Ability to remain free from injury will improve Outcome: Progressing   Problem: Skin Integrity: Goal: Risk for impaired skin integrity will decrease Outcome: Progressing   

## 2019-11-09 NOTE — Plan of Care (Signed)
  Problem: Clinical Measurements: Goal: Will remain free from infection Outcome: Progressing Goal: Diagnostic test results will improve Outcome: Progressing   Problem: Activity: Goal: Risk for activity intolerance will decrease Outcome: Progressing   Problem: Coping: Goal: Level of anxiety will decrease Outcome: Progressing   Problem: Elimination: Goal: Will not experience complications related to bowel motility Outcome: Progressing

## 2019-11-09 NOTE — Progress Notes (Signed)
PROGRESS NOTE    Colleen Ramirez  EXH:[id number] DOB: [birth year] DOA: 09/29/2019 PCP: Patient, No Pcp Per   Brief Narrative:  39 year old with history of obesity status post gastric bypass, homelessness, IV drug abuse-methamphetamine, anemia of chronic disease admitted for sharp neck pain.  Previously admitted here couple months ago for facial cellulitis requiring 10 days of doxycycline and topical clindamycin.  She underwent ACDF on 1/12 for MRSA epidural abscess requiring IV vancomycin and oral rifampin through 11/09/2019. She now has had worsening of C spine pathology DESPITE surgery and aggressive antibiotics with vancomycin and rifampin. Vancomycin AUC have been appropriate. Currently remaining inpatient to complete her treatment course.  Due to swelling of right upper extremity concerns for thrombophlebitis patient was transitioned to Lovenox treatment dosing; subsequently discharged to oral anticoagulation.  Subjective.  No new complaints.  Complains of lower mid back pain and neck pain intermittently that improves with pain medication, not worse than usual.  Agreeable to switch Lovenox to Xarelto.  Discussed importance of not touching her central line to avoid infections.  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  Followed by doxycycline for a month.  But now due to recurrent fever, she will need further 6 to 8 weeks of IV antibiotics followed by oral antibiotics. New stop date for antibiotic is now 12/11/2019.  Switched rifampicin to rifabutin as Lovenox changed to a DOAC to prevent drug interactions.  - MRI thoracic spine on 11/01/2019 showed no evidence of discitis/osteomyelitis but showed ventral paravertebral phlegmon extending to T2 level with no definite abscess formed.  Repeat CT chest on 11/03/2019 showed no new pathology.  No surgical indication at this time per neurosurgery.  - Repeat MRI thoracic spine done for reported worsening mid back pain 11/07/2019 showed no new pathology  -Per ID low threshold to repeat MRI of the neck as she is high risk for developing pyomyositis if she continued to have worsening neck pain (has subjective pain which is difficult to delineate if it is in setting of worsening pathology; though normal white count is reassuring.  Given her fever can be masked with scheduled Tylenol, consider repeat cervical spine imaging sometime this week.  At this time exam is reassuring)  -Seen by ID and neurosurgery  -Right IJ power PICC line placed by IR on 11/06/2019 for long-term antibiotics   Right upper extremity swelling secondary to superficial vein thrombosis Initially seen on ultrasound 10/31/2019 -Supportive care, pain control.  Elevate arm, compression dressing if necessary. -Lovenox switched to Xarelto therapeutic dose on 2/22.  Discussed with pharmacy-Xarelto 15 mg twice daily for 14 days followed by 20 mg daily  Sinus tachycardia Intermittently noted to be tachycardic Suspect this is multifactorial in setting of anxiety, Adderall  History of chronic pain  History of gastric bypass/Roux-en-Y -Continue Neurontin 100 mg 3 times daily -Previous provider had extensive discussion with  patient and family regarding pain medications.  She was switched to p.o. Dilaudid 4 mg every 4 as needed for severe pain which she has been consistently using. -Tylenol scheduled  History of depression/anxiety -Continue Adderall, Abilify and Valium as needed  Tobacco use -Nicotine patch  Hepatitis C positive antibody -Follow-up with infectious disease  Anemia of chronic disease -Hemoglobin stable.   DVT prophylaxis: Lovenox switched to Xarelto Code Status: Full Family Communication:  Disposition Plan:   Patient From= home  Patient Anticipated D/C place= Home  Barriers= maintain hospital stay at this time for IV antibiotics.  Given homelessness, lack of medical resources support she will need to remain in the hospital until the completion of this.  Consultants:   Neurosurgery  Infectious disease  Procedures:   ACDF 1/12  Right IJ PICC line placed 11/06/2019  Antimicrobials:  Anti-infectives (From admission, onward)   Start     Dose/Rate Route Frequency Ordered Stop   11/09/19 2200  rifabutin (MYCOBUTIN) capsule 300 mg     300 mg Oral Every 12 hours 11/09/19 1136     11/05/19 1600  vancomycin (VANCOCIN) IVPB 1000 mg/200 mL premix     1,000 mg 200 mL/hr over 60 Minutes Intravenous Every 12 hours 11/05/19 1504     11/01/19 1400  vancomycin (VANCOREADY) IVPB 1500 mg/300 mL  Status:  Discontinued     1,500 mg 150 mL/hr over 120 Minutes Intravenous Every 12 hours 11/01/19 1305 11/05/19 0823   10/28/19 1000  vancomycin (VANCOREADY) IVPB 1500 mg/300 mL  Status:  Discontinued     1,500 mg 150 mL/hr over 120 Minutes Intravenous Every 12 hours 10/27/19 2353 11/01/19 1305   10/14/19 1000  vancomycin (VANCOREADY) IVPB 1250 mg/250 mL  Status:  Discontinued     1,250 mg 166.7 mL/hr over 90 Minutes Intravenous Every 12 hours 10/14/19 0540 10/27/19 2353   10/13/19 0945  Vancomycin (VANCOCIN) 1,250 mg in sodium chloride 0.9 % 250 mL IVPB  Status:  Discontinued     1,250 mg 166.7  mL/hr over 90 Minutes Intravenous Every 12 hours 10/13/19 0935 10/14/19 0540   10/13/19 0930  Vancomycin (VANCOCIN) 1,250 mg in sodium chloride 0.9 % 250 mL IVPB  Status:  Discontinued     1,250 mg 166.7 mL/hr over 90 Minutes Intravenous Every 12 hours 10/13/19 0902 10/13/19 0934   10/09/19 2000  vancomycin (VANCOCIN) IVPB 1000 mg/200 mL premix  Status:  Discontinued     1,000 mg 200 mL/hr over 60 Minutes Intravenous Every 12 hours 10/09/19 1121 10/13/19 0902   10/04/19 2200  vancomycin (VANCOCIN) IVPB 750 mg/150 ml premix  Status:  Discontinued     750 mg 150 mL/hr over 60 Minutes Intravenous Every 8 hours 10/04/19 1305 10/09/19 1121   10/04/19 1400  vancomycin (VANCOCIN) IVPB 1000 mg/200 mL premix     1,000 mg 200 mL/hr over 60 Minutes Intravenous STAT 10/04/19 1305 10/04/19 1604   10/01/19 1100  rifampin (RIFADIN) capsule 300 mg  Status:  Discontinued     300 mg Oral Every 12 hours 10/01/19 1056 11/09/19 1136   09/30/19 1800  Vancomycin (VANCOCIN) 1,250 mg in sodium chloride 0.9 % 250 mL IVPB  Status:  Discontinued     1,250 mg 166.7 mL/hr over 90 Minutes Intravenous Every 24 hours 09/30/19 0003 09/30/19 0747   09/30/19 1800  vancomycin (VANCOREADY) IVPB 1250 mg/250 mL  Status:  Discontinued  1,250 mg 166.7 mL/hr over 90 Minutes Intravenous Every 24 hours 09/30/19 0746 10/04/19 1305   09/30/19 0400  piperacillin-tazobactam (ZOSYN) IVPB 3.375 g  Status:  Discontinued     3.375 g 12.5 mL/hr over 240 Minutes Intravenous Every 8 hours 09/30/19 0003 09/30/19 1533   09/29/19 2145  piperacillin-tazobactam (ZOSYN) IVPB 3.375 g     3.375 g 12.5 mL/hr over 240 Minutes Intravenous STAT 09/29/19 2137 09/29/19 2148   09/29/19 2119  bacitracin 50,000 Units in sodium chloride 0.9 % 500 mL irrigation  Status:  Discontinued       As needed 09/29/19 2119 09/29/19 2249   09/29/19 2033  vancomycin (VANCOCIN) 1-5 GM/200ML-% IVPB    Note to Pharmacy: Toney Sang   : cabinet override      09/29/19  2033 09/30/19 0844       Examination:  General exam: Appears anxious Respiratory system: Clear to auscultation. Respiratory effort normal. Cardiovascular system: S1 & S2 heard, regular, tachycardic. No pedal edema. Gastrointestinal system: Abdomen is nondistended, soft and nontender. Normal bowel sounds heard. Central nervous system: Alert and oriented. No focal neurological deficits. Extremities: Right upper arm indurated, left forearm with patchy induration consistent with superficial thrombophlebitis much improved Musculoskeletal: Mobility in the neck reduced, when palpating cervical/thoracic spine and paravertebral area- no tenderness but patient reports subjective pain not much changed from prior. No fluctuating mass, no erythema of overlying skin  Skin: No rashes, lesions or ulcers, right IJ central line in place. Psychiatry: Anxious  Objective: Vitals:   11/09/19 0013 11/09/19 0442 11/09/19 0816 11/09/19 1204  BP: 102/75 96/77 108/79 113/80  Pulse: (!) 103 99 (!) 110 (!) 119  Resp: 16 14 16 17   Temp: 98.2 F (36.8 C) 98.5 F (36.9 C) 98.6 F (37 C) 98.5 F (36.9 C)  TempSrc: Oral Oral Oral Oral  SpO2: 99% 100% 96% 99%  Weight:      Height:        Intake/Output Summary (Last 24 hours) at 11/09/2019 1432 Last data filed at 11/08/2019 1900 Gross per 24 hour  Intake 960 ml  Output --  Net 960 ml   Filed Weights   09/29/19 2032  Weight: 49.9 kg     Data Reviewed:   CBC: Recent Labs  Lab 11/04/19 0542 11/04/19 0542 11/05/19 0518 11/06/19 0433 11/07/19 0904 11/08/19 0407 11/09/19 1124  WBC 6.0   < > 4.9 4.9 6.8 7.0 7.0  NEUTROABS 3.6  --   --   --   --   --   --   HGB 10.5*   < > 10.0* 10.4* 11.1* 10.8* 10.3*  HCT 32.9*   < > 31.6* 32.7* 35.2* 34.3* 32.8*  MCV 87.7   < > 88.0 87.4 87.8 88.4 88.2  PLT 358   < > 388 476* 558* 547* 533*   < > = values in this interval not displayed.   Basic Metabolic Panel: Recent Labs  Lab 11/04/19 0542 11/05/19 0518  11/06/19 0433 11/07/19 0904 11/08/19 0407 11/09/19 0500  NA 136 137 139  --   --  135  K 3.7 4.3 4.7  --   --  4.1  CL 104 103 102  --   --  98  CO2 21* 24 27  --   --  28  GLUCOSE 185* 103* 103*  --   --  108*  BUN 7 7 8   --   --  11  CREATININE 0.63 0.45 0.61  --   --  0.59  CALCIUM 8.6* 8.8* 9.0  --   --  9.0  MG 1.8 1.9 2.0 2.0 2.1  --    GFR: Estimated Creatinine Clearance: 74.4 mL/min (by C-G formula based on SCr of 0.59 mg/dL). Liver Function Tests: Recent Labs  Lab 11/04/19 0542 11/05/19 0518 11/06/19 0433  AST 25 25 40  ALT 23 23 27   ALKPHOS 79 71 85  BILITOT 0.5 0.4 0.3  PROT 6.9 6.4* 6.8  ALBUMIN 2.3* 2.4* 2.5*   No results for input(s): LIPASE, AMYLASE in the last 168 hours. No results for input(s): AMMONIA in the last 168 hours. Coagulation Profile: No results for input(s): INR, PROTIME in the last 168 hours. Cardiac Enzymes: No results for input(s): CKTOTAL, CKMB, CKMBINDEX, TROPONINI in the last 168 hours. BNP (last 3 results) No results for input(s): PROBNP in the last 8760 hours. HbA1C: No results for input(s): HGBA1C in the last 72 hours. CBG: No results for input(s): GLUCAP in the last 168 hours. Lipid Profile: No results for input(s): CHOL, HDL, LDLCALC, TRIG, CHOLHDL, LDLDIRECT in the last 72 hours. Thyroid Function Tests: No results for input(s): TSH, T4TOTAL, FREET4, T3FREE, THYROIDAB in the last 72 hours. Anemia Panel: No results for input(s): VITAMINB12, FOLATE, FERRITIN, TIBC, IRON, RETICCTPCT in the last 72 hours. Sepsis Labs: No results for input(s): PROCALCITON, LATICACIDVEN in the last 168 hours.  Recent Results (from the past 240 hour(s))  Culture, blood (Routine X 2) w Reflex to ID Panel     Status: None   Collection Time: 11/02/19 10:25 AM   Specimen: BLOOD  Result Value Ref Range Status   Specimen Description BLOOD LEFT ANTECUBITAL  Final   Special Requests   Final    BOTTLES DRAWN AEROBIC ONLY Blood Culture results may not  be optimal due to an inadequate volume of blood received in culture bottles Performed at [facility], [address], [place], [place]    Culture NO GROWTH 5 DAYS  Final   Report Status 11/07/2019 FINAL  Final  Culture, blood (Routine X 2) w Reflex to ID Panel     Status: None   Collection Time: 11/02/19 10:29 AM   Specimen: BLOOD  Result Value Ref Range Status   Specimen Description BLOOD LEFT ANTECUBITAL  Final   Special Requests   Final    BOTTLES DRAWN AEROBIC ONLY Blood Culture results may not be optimal due to an inadequate volume of blood received in culture bottles Performed at [facility], [address] [place], [place]    Culture NO GROWTH 5 DAYS  Final   Report Status 11/07/2019 FINAL  Final     Radiology Studies: MR THORACIC SPINE W WO CONTRAST  Result Date: 11/08/2019 CLINICAL DATA:  History of cervical discitis and osteomyelitis. Worsening thoracic region back pain. IV drug use. EXAM: MRI THORACIC WITHOUT AND WITH CONTRAST TECHNIQUE: Multiplanar and multiecho pulse sequences of the thoracic spine were obtained without and with intravenous contrast. CONTRAST:  20mL GADAVIST GADOBUTROL 1 MMOL/ML IV SOLN COMPARISON:  11/01/2019 FINDINGS: MRI THORACIC SPINE FINDINGS Alignment:  No significant malalignment. Vertebrae: No thoracic vertebral body abnormality. Redemonstration of an incidental hemangioma at T4 affecting the pedicle, articular processes and transverse process. This is not of any clinical significance. Cord:  Normal Paraspinal and other soft tissues: Anterior paraspinous inflammatory changes extending from the cervical region down as far as upper T2 as seen previously. No evidence of mature abscess. No paravertebral abnormality seen in the chest.  Disc levels: No evidence of thoracic region discitis osteomyelitis. No convincing epidural inflammatory change. Certainly there is no epidural abscess. No evidence of discitis or septic facet  arthritis in the thoracic region. Shallow disc protrusions without neural compression on the right at T4-5 central at T7-8, central at T8-9, and shallow broad-based at T11-12. These are unchanged. IMPRESSION: Patient has had interval surgery in the mid cervical region, not primarily evaluated. As seen previously, there is some anterior paraspinous inflammatory change extending from the cervical region down as far as T2 without evidence of a mature or drainable paraspinal abscess in that region. The cervical area was not imaged other than scout imaging. No evidence of discitis osteomyelitis in the thoracic spine. No evidence of epidural inflammation or abscess at this time. Mild chronic degenerative disc disease is stable as outlined above. Electronically Signed   By: Paulina Fusi M.D.   On: 11/08/2019 00:07    Scheduled Meds: . acetaminophen  1,000 mg Oral TID  . amphetamine-dextroamphetamine  30 mg Oral BID  . ARIPiprazole  20 mg Oral Daily  . buPROPion  150 mg Oral Daily  . Chlorhexidine Gluconate Cloth  6 each Topical Q0600  . diclofenac Sodium  4 g Topical QID  . feeding supplement (ENSURE ENLIVE)  237 mL Oral BID BM  . gabapentin  100 mg Oral TID  . multivitamin with minerals  1 tablet Oral Daily  . nicotine  14 mg Transdermal Daily  . rifabutin  300 mg Oral Q12H  . rivaroxaban  15 mg Oral BID WC   Followed by  . [START ON 11/24/2019] rivaroxaban  20 mg Oral Daily  . senna-docusate  1 tablet Oral BID  . sodium chloride flush  10-40 mL Intracatheter Q12H  . sodium chloride flush  3 mL Intravenous Q12H   Continuous Infusions: . sodium chloride Stopped (10/24/19 1012)  . sodium chloride 250 mL (11/02/19 0043)  . vancomycin 1,000 mg (11/09/19 0410)     LOS: 41 days   Time spent= 35 mins   Liborio Nixon, MD [facility]  If 7PM-7AM, please contact night-coverage  11/09/2019, 2:32 PM   This record has been created using Dragon voice recognition software. Errors have been  sought and corrected,but may not always be located. Such creation errors do not reflect on the standard of care.

## 2019-11-09 NOTE — Progress Notes (Addendum)
STAT order for lab draw, Vancomycin Peak order placed at 1908 and 1941.  Updated pharmacy via phone about status of vanc draw.    Vancomycin peak drawn at 2015. Pharmacy notified.  76: Called lab to verify they received Vanc trough.

## 2019-11-10 LAB — CBC
HCT: 30.9 % — ABNORMAL LOW (ref 36.0–46.0)
Hemoglobin: 9.8 g/dL — ABNORMAL LOW (ref 12.0–15.0)
MCH: 28 pg (ref 26.0–34.0)
MCHC: 31.7 g/dL (ref 30.0–36.0)
MCV: 88.3 fL (ref 80.0–100.0)
Platelets: 494 10*3/uL — ABNORMAL HIGH (ref 150–400)
RBC: 3.5 MIL/uL — ABNORMAL LOW (ref 3.87–5.11)
RDW: 13.8 % (ref 11.5–15.5)
WBC: 6.8 10*3/uL (ref 4.0–10.5)
nRBC: 0 % (ref 0.0–0.2)

## 2019-11-10 LAB — VANCOMYCIN, TROUGH: Vancomycin Tr: 8 ug/mL — ABNORMAL LOW (ref 15–20)

## 2019-11-10 MED ORDER — VANCOMYCIN HCL 1250 MG/250ML IV SOLN
1250.0000 mg | Freq: Two times a day (BID) | INTRAVENOUS | Status: DC
  Administered 2019-11-10 – 2019-11-30 (×40): 1250 mg via INTRAVENOUS
  Filled 2019-11-10 (×41): qty 250

## 2019-11-10 NOTE — Progress Notes (Signed)
Pharmacy Antibiotic Note  Colleen Ramirez is a 39 y.o. female admitted on 09/29/2019 with MRSA cervical spine infection.  Pharmacy has been consulted for vancomycin dosing.  Vanc peak 17, trough 8 >> AUC 386, below goal.  Plan: Change vancomycin to 2150mg  IV Q12H. Goal AUC 400-550.  Expected AUC 480.  Height: 5\' 3"  (160 cm) Weight: 110 lb (49.9 kg) IBW/kg (Calculated) : 52.4  Temp (24hrs), Avg:98.1 F (36.7 C), Min:97.7 F (36.5 C), Max:98.6 F (37 C)  Recent Labs  Lab 11/04/19 0542 11/04/19 0542 11/05/19 0518 11/05/19 0518 11/05/19 1255 11/06/19 0433 11/07/19 0904 11/08/19 0407 11/08/19 2035 11/09/19 0500 11/09/19 1124 11/09/19 1555 11/09/19 2057 11/10/19 0321  WBC 6.0   < > 4.9   < >  --  4.9 6.8 7.0  --   --  7.0  --   --  6.8  CREATININE 0.63  --  0.45  --   --  0.61  --   --   --  0.59  --   --   --   --   VANCOTROUGH  --   --   --   --  11*  --   --   --   --   --   --   --   --  8*  VANCOPEAK  --   --  55*  --   --   --   --   --    < >  --   --  7* 17*  --    < > = values in this interval not displayed.    Estimated Creatinine Clearance: 74.4 mL/min (by C-G formula based on SCr of 0.59 mg/dL).    No Known Allergies   Thank you for allowing pharmacy to be a part of this patient's care.  11/11/19, PharmD, BCPS  11/10/2019 6:28 AM

## 2019-11-10 NOTE — Plan of Care (Signed)
  Problem: Clinical Measurements: Goal: Will remain free from infection Outcome: Progressing   Problem: Coping: Goal: Level of anxiety will decrease Outcome: Progressing   Problem: Pain Managment: Goal: General experience of comfort will improve Outcome: Progressing   Problem: Safety: Goal: Ability to remain free from injury will improve Outcome: Progressing   Problem: Skin Integrity: Goal: Risk for impaired skin integrity will decrease Outcome: Progressing   

## 2019-11-10 NOTE — Progress Notes (Signed)
ANTICOAGULATION CONSULT NOTE  Pharmacy Consult for Xarelto (change from Lovenox on 11/09/19)  Indication: DVT, RUE  No Known Allergies  Patient Measurements: Height: 5\' 3"  (160 cm) Weight: 110 lb (49.9 kg) IBW/kg (Calculated) : 52.4 Lovenox Dosing Weight: 49.9 kg  Vital Signs: Temp: 97.3 F (36.3 C) (02/23 0804) Temp Source: Oral (02/23 0804) BP: 107/76 (02/23 0804) Pulse Rate: 105 (02/23 0804)  Labs: Recent Labs    11/08/19 0407 11/08/19 0407 11/09/19 0500 11/09/19 1124 11/10/19 0321  HGB 10.8*   < >  --  10.3* 9.8*  HCT 34.3*  --   --  32.8* 30.9*  PLT 547*  --   --  533* 494*  CREATININE  --   --  0.59  --   --    < > = values in this interval not displayed.    Estimated Creatinine Clearance: 74.4 mL/min (by C-G formula based on SCr of 0.59 mg/dL).  Assessment: 32 yof with new RUE DVT. On 11/09/19 treatment dose Lovenox switched to a DOAC> Xarelto.  Had received 7 days of Lovenox full dose for DVT.    Discussed with Dr. 11/11/19  On 11/09/19, plan for Xarelto 15 mg twice daily for 14 days followed by 20 mg daily. Anemia of chronic disease. Hgb 9.8 down from 11-10 range , ptlc 494 ,  no bleeding noted.   Patient to maintain hospital stay at this time for IV antibiotics, 6 to 8 weeks of  IV antibiotics, stop date 12/11/2019.   Need transitions of care management to determine resources/ copay for outpatient Xarelto.    Goal of Therapy:  Anti-Xa level 0.6-1 units/ml 4hrs after LMWH dose given Monitor platelets by anticoagulation protocol: Yes   Plan:  Lovenox stopped on 11/09/19 , switched to Xarelto Xarelto 15mg  BIDWC x 14 days (received 7 days of treatment dose Lovenox) then on 3/9 reduce to 20mg  daily.  TOC consult to evaluate copay/ resources for outpatient Xarelto.   Thank you for the interesting consult and for involving pharmacy in this patient's care. , RPh Clinical Pharmacist  Please check [email or web address] for unit-specific pharmacist phone numbers

## 2019-11-10 NOTE — Progress Notes (Signed)
PROGRESS NOTE    Colleen Ramirez  NLG:[id number] DOB: 1980/11/15 DOA: 09/29/2019 PCP: Patient, No Pcp Per   Brief Narrative:  39 year old with history of obesity status post gastric bypass, homelessness, IV drug abuse-methamphetamine, anemia of chronic disease admitted for sharp neck pain.  Previously admitted here couple months ago for facial cellulitis requiring 10 days of doxycycline and topical clindamycin.  She underwent ACDF on 1/12 for MRSA epidural abscess requiring IV vancomycin and oral rifampin through 11/09/2019. She now has had worsening of C spine pathology DESPITE surgery and aggressive antibiotics with vancomycin and rifampin. Vancomycin AUC have been appropriate. Currently remaining inpatient to complete her treatment course.  Due to swelling of right upper extremity concerns for thrombophlebitis patient was transitioned to Lovenox treatment dosing; subsequently discharged to oral anticoagulation.  Subjective.  No new complaints.  Complains of lower mid back pain and neck pain intermittently that improves with pain medication, not worse than usual.  Requesting for a repeat scan of her neck to ensure it is not getting worse. Explained down trending CRP reassuring for now. She is agreeable to considering a neck scan sometime later this week or if the pain is worse.   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 CRP downtrending.   -Status post C5-6 ACDF on 09/29/2019  -Original plan was to continue vancomycin IV/oral rifampin until 11/09/2019.  Followed by doxycycline for a month.  But now due to recurrent fever, she will need further 6 to 8 weeks of IV antibiotics followed by oral antibiotics. New stop date for antibiotic is now 12/11/2019.  Switched rifampicin to rifabutin as Lovenox changed to a DOAC to prevent drug interactions.  - MRI thoracic spine on 11/01/2019 showed no evidence of discitis/osteomyelitis but showed ventral paravertebral phlegmon extending to T2 level with no definite abscess formed.  Repeat CT chest on 11/03/2019 showed no new pathology.  No surgical indication at this time per neurosurgery.  - Repeat MRI thoracic spine done for reported worsening mid back pain 11/07/2019 showed no new pathology  -Per ID low threshold to repeat MRI of the neck as she is high risk for developing pyomyositis if she continued to have worsening neck pain (has subjective pain which is difficult to delineate if it is in setting of worsening pathology; though normal white count is reassuring.  Given her fever can be masked with scheduled Tylenol, consider repeat cervical spine imaging sometime later this week.  At this time exam, down trending CRP is reassuring)  -Seen by ID and neurosurgery  -Right IJ power PICC line placed by IR on 11/06/2019 for long-term antibiotics   Right upper extremity swelling secondary to superficial vein thrombosis Initially seen on ultrasound 10/31/2019 -Supportive care, pain control.  Elevate arm, compression dressing if necessary. -Lovenox switched to Xarelto therapeutic dose on 2/22.  Discussed with pharmacy-Xarelto 15 mg twice daily for  14 days followed by 20 mg daily  Sinus tachycardia Intermittently noted to be tachycardic Suspect this is multifactorial in setting of anxiety, Adderall  History of chronic  pain History of gastric bypass/Roux-en-Y -Continue Neurontin 100 mg 3 times daily -Previous provider had extensive discussion with patient and family regarding pain medications.  She was switched to p.o. Dilaudid 4 mg every 4 as needed for severe pain which she has been consistently using. -Tylenol scheduled  History of depression/anxiety -Continue Adderall, Abilify and Valium as needed  Tobacco use -Nicotine patch  Hepatitis C positive antibody -Follow-up with infectious disease  Anemia of chronic disease -Hemoglobin stable.   DVT prophylaxis: Lovenox switched to Xarelto Code Status: Full Family Communication:  Disposition Plan:   Patient From= home  Patient Anticipated D/C place= Home  Barriers= maintain hospital stay at this time for IV antibiotics.  Given homelessness, lack of medical resources support she will need to remain in the hospital until the completion of this.  Consultants:   Neurosurgery  Infectious disease  Procedures:   ACDF 1/12  Right IJ PICC line placed 11/06/2019  Antimicrobials:  Anti-infectives (From admission, onward)   Start     Dose/Rate Route Frequency Ordered Stop   11/10/19 1200  vancomycin (VANCOREADY) IVPB 1250 mg/250 mL     1,250 mg 166.7 mL/hr over 90 Minutes Intravenous Every 12 hours 11/10/19 0627     11/09/19 2200  rifabutin (MYCOBUTIN) capsule 300 mg  Status:  Discontinued     300 mg Oral Every 12 hours 11/09/19 1136 11/09/19 1646   11/09/19 2200  rifabutin (MYCOBUTIN) capsule 300 mg     300 mg Oral Daily 11/09/19 1646     11/05/19 1600  vancomycin (VANCOCIN) IVPB 1000 mg/200 mL premix  Status:  Discontinued     1,000 mg 200 mL/hr over 60 Minutes Intravenous Every 12 hours 11/05/19 1504 11/10/19 0627   11/01/19 1400  vancomycin (VANCOREADY) IVPB 1500 mg/300 mL  Status:  Discontinued     1,500 mg 150 mL/hr over 120 Minutes Intravenous Every 12 hours 11/01/19 1305 11/05/19 0823   10/28/19 1000  vancomycin (VANCOREADY) IVPB 1500  mg/300 mL  Status:  Discontinued     1,500 mg 150 mL/hr over 120 Minutes Intravenous Every 12 hours 10/27/19 2353 11/01/19 1305   10/14/19 1000  vancomycin (VANCOREADY) IVPB 1250 mg/250 mL  Status:  Discontinued     1,250 mg 166.7 mL/hr over 90 Minutes Intravenous Every 12 hours 10/14/19 0540 10/27/19 2353   10/13/19 0945  Vancomycin (VANCOCIN) 1,250 mg in sodium chloride 0.9 % 250 mL IVPB  Status:  Discontinued     1,250 mg 166.7 mL/hr over 90 Minutes Intravenous Every 12 hours 10/13/19 0935 10/14/19 0540   10/13/19 0930  Vancomycin (VANCOCIN) 1,250 mg in sodium chloride 0.9 % 250 mL IVPB  Status:  Discontinued     1,250 mg 166.7 mL/hr over 90 Minutes Intravenous Every 12 hours 10/13/19 0902 10/13/19 0934   10/09/19 2000  vancomycin (VANCOCIN) IVPB 1000 mg/200 mL premix  Status:  Discontinued     1,000 mg 200 mL/hr over 60 Minutes Intravenous Every 12 hours 10/09/19 1121 10/13/19 0902   10/04/19 2200  vancomycin (VANCOCIN) IVPB 750 mg/150 ml premix  Status:  Discontinued     750 mg 150 mL/hr over 60 Minutes Intravenous Every 8 hours 10/04/19 1305 10/09/19 1121   10/04/19 1400  vancomycin (VANCOCIN) IVPB 1000 mg/200 mL premix     1,000 mg 200 mL/hr over 60 Minutes Intravenous  STAT 10/04/19 1305 10/04/19 1604   10/01/19 1100  rifampin (RIFADIN) capsule 300 mg  Status:  Discontinued     300 mg Oral Every 12 hours 10/01/19 1056 11/09/19 1136   09/30/19 1800  Vancomycin (VANCOCIN) 1,250 mg in sodium chloride 0.9 % 250 mL IVPB  Status:  Discontinued     1,250 mg 166.7 mL/hr over 90 Minutes Intravenous Every 24 hours 09/30/19 0003 09/30/19 0747   09/30/19 1800  vancomycin (VANCOREADY) IVPB 1250 mg/250 mL  Status:  Discontinued     1,250 mg 166.7 mL/hr over 90 Minutes Intravenous Every 24 hours 09/30/19 0746 10/04/19 1305   09/30/19 0400  piperacillin-tazobactam (ZOSYN) IVPB 3.375 g  Status:  Discontinued     3.375 g 12.5 mL/hr over 240 Minutes Intravenous Every 8 hours 09/30/19 0003 09/30/19  1533   09/29/19 2145  piperacillin-tazobactam (ZOSYN) IVPB 3.375 g     3.375 g 12.5 mL/hr over 240 Minutes Intravenous STAT 09/29/19 2137 09/29/19 2148   09/29/19 2119  bacitracin 50,000 Units in sodium chloride 0.9 % 500 mL irrigation  Status:  Discontinued       As needed 09/29/19 2119 09/29/19 2249   09/29/19 2033  vancomycin (VANCOCIN) 1-5 GM/200ML-% IVPB    Note to Pharmacy: Toney Sang   : cabinet override      09/29/19 2033 09/30/19 0844       Examination:  General exam: Appears anxious Respiratory system: Clear to auscultation. Respiratory effort normal. Cardiovascular system: S1 & S2 heard, regular, tachycardic. No pedal edema. Gastrointestinal system: Abdomen is nondistended, soft and nontender. Normal bowel sounds heard. Central nervous system: Alert and oriented. No focal neurological deficits. Extremities: Right upper arm indurated, left forearm with patchy induration consistent with superficial thrombophlebitis much improved Musculoskeletal: Mobility in the neck reduced, when palpating cervical/thoracic spine and paravertebral area- no tenderness but patient reports subjective pain not much changed from prior. No fluctuating mass, no erythema of overlying skin  Skin: No rashes, lesions or ulcers, right IJ central line in place. Psychiatry: Anxious  Objective: Vitals:   11/09/19 2140 11/10/19 0535 11/10/19 0804 11/10/19 1500  BP: 120/85 99/67 107/76 112/83  Pulse: 98 99 (!) 105 (!) 106  Resp: 20 18 18 18   Temp: 97.7 F (36.5 C) 97.9 F (36.6 C) (!) 97.3 F (36.3 C) 97.8 F (36.6 C)  TempSrc: Oral Oral Oral Oral  SpO2: 100% 100% 98% 99%  Weight:      Height:        Intake/Output Summary (Last 24 hours) at 11/10/2019 1716 Last data filed at 11/10/2019 1647 Gross per 24 hour  Intake 743.61 ml  Output --  Net 743.61 ml   Filed Weights   09/29/19 2032  Weight: 49.9 kg     Data Reviewed:   CBC: Recent Labs  Lab 11/04/19 0542 11/05/19 0518  11/06/19 0433 11/07/19 0904 11/08/19 0407 11/09/19 1124 11/10/19 0321  WBC 6.0   < > 4.9 6.8 7.0 7.0 6.8  NEUTROABS 3.6  --   --   --   --   --   --   HGB 10.5*   < > 10.4* 11.1* 10.8* 10.3* 9.8*  HCT 32.9*   < > 32.7* 35.2* 34.3* 32.8* 30.9*  MCV 87.7   < > 87.4 87.8 88.4 88.2 88.3  PLT 358   < > 476* 558* 547* 533* 494*   < > = values in this interval not displayed.   Basic Metabolic Panel: Recent Labs  Lab 11/04/19 0542  11/05/19 0518 11/06/19 0433 11/07/19 0904 11/08/19 0407 11/09/19 0500  NA 136 137 139  --   --  135  K 3.7 4.3 4.7  --   --  4.1  CL 104 103 102  --   --  98  CO2 21* 24 27  --   --  28  GLUCOSE 185* 103* 103*  --   --  108*  BUN 7 7 8   --   --  11  CREATININE 0.63 0.45 0.61  --   --  0.59  CALCIUM 8.6* 8.8* 9.0  --   --  9.0  MG 1.8 1.9 2.0 2.0 2.1  --    GFR: Estimated Creatinine Clearance: 74.4 mL/min (by C-G formula based on SCr of 0.59 mg/dL). Liver Function Tests: Recent Labs  Lab 11/04/19 0542 11/05/19 0518 11/06/19 0433  AST 25 25 40  ALT 23 23 27   ALKPHOS 79 71 85  BILITOT 0.5 0.4 0.3  PROT 6.9 6.4* 6.8  ALBUMIN 2.3* 2.4* 2.5*   No results for input(s): LIPASE, AMYLASE in the last 168 hours. No results for input(s): AMMONIA in the last 168 hours. Coagulation Profile: No results for input(s): INR, PROTIME in the last 168 hours. Cardiac Enzymes: No results for input(s): CKTOTAL, CKMB, CKMBINDEX, TROPONINI in the last 168 hours. BNP (last 3 results) No results for input(s): PROBNP in the last 8760 hours. HbA1C: No results for input(s): HGBA1C in the last 72 hours. CBG: No results for input(s): GLUCAP in the last 168 hours. Lipid Profile: No results for input(s): CHOL, HDL, LDLCALC, TRIG, CHOLHDL, LDLDIRECT in the last 72 hours. Thyroid Function Tests: No results for input(s): TSH, T4TOTAL, FREET4, T3FREE, THYROIDAB in the last 72 hours. Anemia Panel: No results for input(s): VITAMINB12, FOLATE, FERRITIN, TIBC, IRON, RETICCTPCT  in the last 72 hours. Sepsis Labs: No results for input(s): PROCALCITON, LATICACIDVEN in the last 168 hours.  Recent Results (from the past 240 hour(s))  Culture, blood (Routine X 2) w Reflex to ID Panel     Status: None   Collection Time: 11/02/19 10:25 AM   Specimen: BLOOD  Result Value Ref Range Status   Specimen Description BLOOD LEFT ANTECUBITAL  Final   Special Requests   Final    BOTTLES DRAWN AEROBIC ONLY Blood Culture results may not be optimal due to an inadequate volume of blood received in culture bottles Performed at [facility] [address] [place], [place] [address]    Culture NO GROWTH 5 DAYS  Final   Report Status 11/07/2019 FINAL  Final  Culture, blood (Routine X 2) w Reflex to ID Panel     Status: None   Collection Time: 11/02/19 10:29 AM   Specimen: BLOOD  Result Value Ref Range Status   Specimen Description BLOOD LEFT ANTECUBITAL  Final   Special Requests   Final    BOTTLES DRAWN AEROBIC ONLY Blood Culture results may not be optimal due to an inadequate volume of blood received in culture bottles Performed at [facility] [address], [place], [place] [address]    Culture NO GROWTH 5 DAYS  Final   Report Status 11/07/2019 FINAL  Final     Radiology Studies: No results found.  Scheduled Meds: . acetaminophen  1,000 mg Oral TID  . amphetamine-dextroamphetamine  30 mg Oral BID  . ARIPiprazole  20 mg Oral Daily  . buPROPion  150 mg Oral Daily  . Chlorhexidine Gluconate Cloth  6 each Topical  [id number]  . diclofenac Sodium  4 g Topical QID  . feeding supplement (ENSURE ENLIVE)  237 mL Oral BID BM  . gabapentin  100 mg Oral TID  . multivitamin with minerals  1 tablet Oral Daily  . nicotine  14 mg Transdermal Daily  . rifabutin  300 mg Oral Daily  . rivaroxaban  15 mg Oral BID WC   Followed by  . [START ON 11/24/2019] rivaroxaban  20 mg Oral Daily  . senna-docusate  1 tablet Oral BID  . sodium chloride flush  10-40 mL Intracatheter Q12H  .  sodium chloride flush  3 mL Intravenous Q12H   Continuous Infusions: . sodium chloride Stopped (10/24/19 1012)  . sodium chloride Stopped (11/10/19 1630)  . vancomycin 166.7 mL/hr at 11/10/19 1647     LOS: 42 days   Time spent= 35 mins   Liborio Nixon, MD [facility]  If 7PM-7AM, please contact night-coverage  11/10/2019, 5:16 PM   This record has been created using Dragon voice recognition software. Errors have been sought and corrected,but may not always be located. Such creation errors do not reflect on the standard of care.

## 2019-11-10 NOTE — Plan of Care (Signed)
  Problem: Clinical Measurements: Goal: Will remain free from infection Outcome: Progressing   Problem: Activity: Goal: Risk for activity intolerance will decrease Outcome: Progressing   Problem: Nutrition: Goal: Adequate nutrition will be maintained Outcome: Progressing   Problem: Coping: Goal: Level of anxiety will decrease Outcome: Progressing   Problem: Elimination: Goal: Will not experience complications related to bowel motility Outcome: Progressing   Problem: Pain Managment: Goal: General experience of comfort will improve Outcome: Progressing   Problem: Safety: Goal: Ability to remain free from injury will improve Outcome: Progressing   

## 2019-11-11 LAB — CBC
HCT: 34.2 % — ABNORMAL LOW (ref 36.0–46.0)
Hemoglobin: 10.6 g/dL — ABNORMAL LOW (ref 12.0–15.0)
MCH: 27.3 pg (ref 26.0–34.0)
MCHC: 31 g/dL (ref 30.0–36.0)
MCV: 88.1 fL (ref 80.0–100.0)
Platelets: 551 10*3/uL — ABNORMAL HIGH (ref 150–400)
RBC: 3.88 MIL/uL (ref 3.87–5.11)
RDW: 13.9 % (ref 11.5–15.5)
WBC: 7.5 10*3/uL (ref 4.0–10.5)
nRBC: 0 % (ref 0.0–0.2)

## 2019-11-11 NOTE — Progress Notes (Signed)
ANTICOAGULATION CONSULT NOTE - Follow Up Consult  Pharmacy Consult for Xarelto Indication: DVT  No Known Allergies  Patient Measurements: Height: 5\' 3"  (160 cm) Weight: 110 lb (49.9 kg) IBW/kg (Calculated) : 52.4  Vital Signs: Temp: 98.1 F (36.7 C) (02/24 0758) Temp Source: Oral (02/24 0758) BP: 103/75 (02/24 0758) Pulse Rate: 107 (02/24 0758)  Labs: Recent Labs    11/09/19 0500 11/09/19 1124 11/10/19 0321  HGB  --  10.3* 9.8*  HCT  --  32.8* 30.9*  PLT  --  533* 494*  CREATININE 0.59  --   --     Estimated Creatinine Clearance: 74.4 mL/min (by C-G formula based on SCr of 0.59 mg/dL).  Assessment:  Anticoag: acute RUE DVT  - Hgb 9.8 stable, pltc high, no AC PTA  >LMWH full dose x 7 days> switched to Xarelto 15 mg bidwc x 14 days then on 3/9 reduce to 20mg  daily . - Pt is homeless, TOC consult to eval for resources/copay for outp Xarelto. - Pt to remain in hospital for IV abx (through 12/11/19)  Goal of Therapy:  Therapeutic oral anticoagulation Monitor platelets by anticoagulation protocol: Yes   Plan:  - Xarelto 15 mg bidwc x 14 days then on 3/9 reduce to 20mg  daily    Marca Gadsby S. 12/13/19, PharmD, BCPS Clinical Staff Pharmacist [email or web address] 5/9, 11/11/2019,9:50 AM

## 2019-11-11 NOTE — Progress Notes (Signed)
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  Complicated by right arm deep vein thrombosis.  39 year old female with significant past medical history for IV drug abuse, anxiety, depression, anemia and status post gastric bypass.  She reported 2-day history of severe sharp neck pain, worse with movement.  Recent hospitalization 08/18/19 to 08/25/23 facial cellulitis.  On her initial physical examination blood pressure 114/85, heart rate 105, respirate 16, temperature 99.8, oxygen saturation 97%, her lungs were clear to auscultation bilaterally, heart S1-S2 present rhythmic, abdomen soft, no lower extremity edema, neurologically nonfocal.  MRI cervical spine with discitis osteomyelitis at C5-6 and C6-7.  Pronounced prevertebral/retropharyngeal soft tissue phlegmonous inflammation with areas of nonenhancing material consistent with pus.  Epidural phlegmonus inflammation from C3-T1 with a few small areas of nonenhancing material particularly at the C5-6 and C6-7 level consistent with early epidural abscess.  01/12, patient underwent cervical 5 through cervical 6 anterior discectomy and instrumented fusion.  Cultures were positive for MRSA, patient treated with vancomycin and rifampin.  02/14 Follow-up cervical MRI with progression of findings consistent with discitis/osteomyelitis involving C4, C5, C6 and C7 vertebrae.  Persistent ventral greater than dorsal epidural phlegmon spanning the C2-C7 levels, persistent vertebral, retropharyngeal phlegmon.  Recommendations to follow-up with aggressive medical therapy, IV antibiotics until March 26.  Assessment & Plan:   Principal Problem:   Abscess in epidural space of cervical spine Active Problems:   Depression  Acquired fusion of cervical spine   IVDU (intravenous drug user)   Homeless   S/P cervical discectomy   Anxiety   Status post gastric bypass for obesity   Hepatitis C antibody positive in blood   Acute bilateral thoracic back pain   Body aches   Methamphetamine abuse (HCC)   Pain of right sternoclavicular joint   1. Cervical spine epidural abscess with vertebral osteomyelitis/ MRSA. C4 to C7/ sp fusion. IV drug abuse/ hepatitis C. Patient continue to have pain that is controlled with analgesics (hydromorphone and gabapentin) , range of motion is limited to extension.   Patient has been afebrile and with no leokocytosis. Will continue current antibiotic regimen with vancomycin and rifabutin. Stop date is March 26.  Will do a follow up MRI before completion antibiotic therapy in March. No indication for a scan today.   2. Right upper extremity superficial and deep vein thrombosis/ cephalic and brachial veins. Will continue supportive physical measures of limb elevation and continue anticoagulation with rivaroxaban.   3. Depression and anxiety/ tobacco abuse. Continue medical regimen with adderall, aripiprazole, trazodone and bupropion. PRN diazepam.   4. Chronic multifactorial anemia. Hgb has been stable, not candidate for prbc transfusion.      DVT prophylaxis: rivaroxaban   Code Status:  full Family Communication: no family at the bedside  Disposition Plan/ discharge barriers: patient will need to complete antibiotic therapy IV until March 26   Nutrition Status: Nutrition Problem: Increased nutrient needs Etiology: post-op healing Signs/Symptoms: estimated needs Interventions: Ensure Enlive (each supplement provides 350kcal and 20 grams of protein), MVI    Subjective: Patient with persistent neck pain, stable, dull in nature, noted decreased range of motion of the cervical spine, no nausea or vomiting, no chest pain. Patient is requesting MRI spine.   Objective: Vitals:     11/10/19 1500 11/10/19 2035  11/11/19 0449 11/11/19 0758  BP: 112/83 104/73 99/73 103/75  Pulse: (!) 106 (!) 109 97 (!) 107  Resp: 18 18 20 17   Temp: 97.8 F (36.6 C) 98.2 F (36.8 C) 97.6 F (36.4 C) 98.1 F (36.7 C)  TempSrc: Oral Oral Oral Oral  SpO2: 99% 99% 99% 98%  Weight:      Height:        Intake/Output Summary (Last 24 hours) at 11/11/2019 1120 Last data filed at 11/10/2019 1700 Gross per 24 hour  Intake 863.61 ml  Output --  Net 863.61 ml   Filed Weights   09/29/19 2032  Weight: 49.9 kg    Examination:   General: Not in pain or dyspnea, deconditioned  Neurology: Awake and alert, non focal  E ENT: no pallor, no icterus, oral mucosa moist Cardiovascular: No JVD. S1-S2 present, rhythmic, no gallops, rubs, or murmurs. No lower extremity edema. Pulmonary: positive breath sounds bilaterally, adequate air movement, no wheezing, rhonchi or rales. Gastrointestinal. Abdomen with no organomegaly, non tender, no rebound or guarding Skin. No rashes Musculoskeletal: no joint deformities     Data Reviewed: I have personally reviewed following labs and imaging studies  CBC: Recent Labs  Lab 11/06/19 0433 11/07/19 0904 11/08/19 0407 11/09/19 1124 11/10/19 0321  WBC 4.9 6.8 7.0 7.0 6.8  HGB 10.4* 11.1* 10.8* 10.3* 9.8*  HCT 32.7* 35.2* 34.3* 32.8* 30.9*  MCV 87.4 87.8 88.4 88.2 88.3  PLT 476* 558* 547* 533* 494*   Basic Metabolic Panel: Recent Labs  Lab 11/05/19 0518 11/06/19 0433 11/07/19 0904 11/08/19 0407 11/09/19 0500  NA 137 139  --   --  135  K 4.3 4.7  --   --  4.1  CL 103 102  --   --  98  CO2 24 27  --   --  28  GLUCOSE 103* 103*  --   --  108*  BUN 7 8  --   --  11  CREATININE 0.45 0.61  --   --  0.59  CALCIUM 8.8* 9.0  --   --  9.0  MG 1.9 2.0 2.0 2.1  --    GFR: Estimated Creatinine Clearance: 74.4 mL/min (by C-G formula based on SCr of 0.59 mg/dL). Liver Function Tests: Recent Labs  Lab 11/05/19 0518 11/06/19 0433  AST 25 40   ALT 23 27  ALKPHOS 71 85  BILITOT 0.4 0.3  PROT 6.4* 6.8  ALBUMIN 2.4* 2.5*   No results for input(s): LIPASE, AMYLASE in the last 168 hours. No results for input(s): AMMONIA in the last 168 hours. Coagulation Profile: No results for input(s): INR, PROTIME in the last 168 hours. Cardiac Enzymes: No results for input(s): CKTOTAL, CKMB, CKMBINDEX, TROPONINI in the last 168 hours. BNP (last 3 results) No results for input(s): PROBNP in the last 8760 hours. HbA1C: No results for input(s): HGBA1C in the last 72 hours. CBG: No results for input(s): GLUCAP in the last 168 hours. Lipid Profile: No results for input(s): CHOL, HDL, LDLCALC, TRIG, CHOLHDL, LDLDIRECT in the last 72 hours. Thyroid Function Tests: No results for input(s): TSH, T4TOTAL, FREET4, T3FREE, THYROIDAB in the last 72 hours. Anemia Panel: No results for input(s): VITAMINB12, FOLATE, FERRITIN, TIBC, IRON, RETICCTPCT in the last 72 hours.    Radiology Studies: I have reviewed all of the imaging during this hospital visit personally     Scheduled Meds: . acetaminophen  1,000 mg Oral TID  . amphetamine-dextroamphetamine  30 mg Oral BID  .  ARIPiprazole  20 mg Oral Daily  . buPROPion  150 mg Oral Daily  . Chlorhexidine Gluconate Cloth  6 each Topical Q0600  . diclofenac Sodium  4 g Topical QID  . feeding supplement (ENSURE ENLIVE)  237 mL Oral BID BM  . gabapentin  100 mg Oral TID  . multivitamin with minerals  1 tablet Oral Daily  . nicotine  14 mg Transdermal Daily  . rifabutin  300 mg Oral Daily  . rivaroxaban  15 mg Oral BID WC   Followed by  . [START ON 11/24/2019] rivaroxaban  20 mg Oral Daily  . senna-docusate  1 tablet Oral BID  . sodium chloride flush  10-40 mL Intracatheter Q12H  . sodium chloride flush  3 mL Intravenous Q12H   Continuous Infusions: . sodium chloride Stopped (10/24/19 1012)  . sodium chloride Stopped (11/10/19 1630)  . vancomycin 1,250 mg (11/11/19 0524)     LOS: 43 days         Colleen Bolte Gerome Apley, MD

## 2019-11-11 NOTE — Plan of Care (Signed)
  Problem: Clinical Measurements: Goal: Will remain free from infection Outcome: Progressing Goal: Respiratory complications will improve Outcome: Progressing   Problem: Activity: Goal: Risk for activity intolerance will decrease Outcome: Progressing   Problem: Coping: Goal: Level of anxiety will decrease Outcome: Progressing   

## 2019-11-12 DIAGNOSIS — Z9884 Bariatric surgery status: Secondary | ICD-10-CM

## 2019-11-12 LAB — CBC
HCT: 28.4 % — ABNORMAL LOW (ref 36.0–46.0)
Hemoglobin: 8.8 g/dL — ABNORMAL LOW (ref 12.0–15.0)
MCH: 27.8 pg (ref 26.0–34.0)
MCHC: 31 g/dL (ref 30.0–36.0)
MCV: 89.6 fL (ref 80.0–100.0)
Platelets: 440 10*3/uL — ABNORMAL HIGH (ref 150–400)
RBC: 3.17 MIL/uL — ABNORMAL LOW (ref 3.87–5.11)
RDW: 13.7 % (ref 11.5–15.5)
WBC: 5.6 10*3/uL (ref 4.0–10.5)
nRBC: 0 % (ref 0.0–0.2)

## 2019-11-12 MED ORDER — INFLUENZA VAC SPLIT QUAD 0.5 ML IM SUSY
0.5000 mL | PREFILLED_SYRINGE | INTRAMUSCULAR | Status: DC
  Filled 2019-11-12: qty 0.5

## 2019-11-12 NOTE — Plan of Care (Signed)
  Problem: Clinical Measurements: Goal: Will remain free from infection Outcome: Progressing   Problem: Coping: Goal: Level of anxiety will decrease Outcome: Progressing   Problem: Pain Managment: Goal: General experience of comfort will improve Outcome: Progressing   Problem: Safety: Goal: Ability to remain free from injury will improve Outcome: Progressing   Problem: Skin Integrity: Goal: Risk for impaired skin integrity will decrease Outcome: Progressing   

## 2019-11-12 NOTE — Progress Notes (Addendum)
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  Complicated by right arm deep vein thrombosis.  39 year old female with significant past medical history for IV drug abuse, anxiety, depression, anemia and status post gastric bypass.  She reported 2-day history of severe sharp neck pain, worse with movement.  Recent hospitalization 08/18/19 to 08/25/23 facial cellulitis.  On her initial physical examination blood pressure 114/85, heart rate 105, respirate 16, temperature 99.8, oxygen saturation 97%, her lungs were clear to auscultation bilaterally, heart S1-S2 present rhythmic, abdomen soft, no lower extremity edema, neurologically nonfocal.  MRI cervical spine with discitis osteomyelitis at C5-6 and C6-7.  Pronounced prevertebral/retropharyngeal soft tissue phlegmonous inflammation with areas of nonenhancing material consistent with pus.  Epidural phlegmonus inflammation from C3-T1 with a few small areas of nonenhancing material particularly at the C5-6 and C6-7 level consistent with early epidural abscess.  01/12, patient underwent cervical 5 through cervical 6 anterior discectomy and instrumented fusion.  Cultures were positive for MRSA, patient treated with vancomycin and rifampin.  02/14 Follow-up cervical MRI with progression of findings consistent with discitis/osteomyelitis involving C4, C5, C6 and C7 vertebrae.  Persistent ventral greater than dorsal epidural phlegmon spanning the C2-C7 levels, persistent vertebral, retropharyngeal phlegmon.  Recommendations to follow-up with aggressive medical therapy, IV antibiotics until March 26.   Assessment & Plan:   Principal Problem:   Abscess in epidural space of cervical spine Active Problems:   Depression  Acquired fusion of cervical spine   IVDU (intravenous drug user)   Homeless   S/P cervical discectomy   Anxiety   Status post gastric bypass for obesity   Hepatitis C antibody positive in blood   Acute bilateral thoracic back pain   Body aches   Methamphetamine abuse (HCC)   Pain of right sternoclavicular joint    1. Cervical spine epidural abscess with vertebral osteomyelitis/ MRSA. C4 to C7/ sp fusion. IV drug abuse/ hepatitis C.  Continue pain control with hydromorphone and gabapentin , range of motion is limited to extension. I talked to her about buprenorphine, that can be an option for discharge, patient will need pain clinic for follow up.   Continue antibiotic therapy with good toleration with IV vancomycin/ rifabutin, end date March 26. Follow CRP in am.   Will do cervical spine before discharge.   2. Right upper extremity superficial and deep vein thrombosis/ cephalic and brachial veins. No significant pain on right upper extremity, tolerating well anticoagulation with rivaroxaban.   3. Depression and anxiety/ tobacco abuse. On adderall, aripiprazole, trazodone and bupropion. PRN diazepam. With good toleration no confusion or agitation.   4. Chronic multifactorial anemia. Stable cell counts with Hgb at 8,8 and hct at 28.     DVT prophylaxis: rivaroxaban   Code Status:  full Family Communication: no family at the bedside  Disposition Plan/ discharge barriers: patient will need to complete antibiotic therapy IV until March 26   Nutrition Status: Nutrition Problem: Increased nutrient needs Etiology: post-op healing Signs/Symptoms: estimated needs Interventions: Ensure Enlive (each supplement provides 350kcal and 20 grams of protein), MVI     Skin Documentation:     Consultants:   Neurosurgery   ID   Procedures:   C4 to C7 fusion  Antimicrobials:   Vancomycin.     Subjective: Patient continue to  complain of neck pain, moderate in intensity,  dull in nature, worse with movement, no associated nausea or vomiting, no chest pain or dyspnea. Pain improved with analgesics.   Objective: Vitals:   11/11/19 1408 11/11/19 2100 11/12/19 0408 11/12/19 0755  BP: 102/82 (!) 102/56 102/69 116/81  Pulse: (!) 108 98 91 (!) 106  Resp: 18 18 19 17   Temp: 97.7 F (36.5 C) 98.9 F (37.2 C) 97.8 F (36.6 C) 98.3 F (36.8 C)  TempSrc: Oral Oral Oral Oral  SpO2: 100% 100% 100% 100%  Weight:      Height:        Intake/Output Summary (Last 24 hours) at 11/12/2019 1034 Last data filed at 11/12/2019 1023 Gross per 24 hour  Intake 10 ml  Output --  Net 10 ml   Filed Weights   09/29/19 2032  Weight: 49.9 kg    Examination:   General: Not in pain or dyspnea.  Neurology: Awake and alert, non focal  E ENT: no pallor, no icterus, oral mucosa moist Cardiovascular: No JVD. S1-S2 present, rhythmic, no gallops, rubs, or murmurs. No lower extremity edema. Pulmonary: positive breath sounds bilaterally, adequate air movement, no wheezing, rhonchi or rales. Gastrointestinal. Abdomen with no organomegaly, non tender, no rebound or guarding Skin. No rashes Musculoskeletal: no joint deformities     Data Reviewed: I have personally reviewed following labs and imaging studies  CBC: Recent Labs  Lab 11/08/19 0407 11/09/19 1124 11/10/19 0321 11/11/19 1226 11/12/19 0419  WBC 7.0 7.0 6.8 7.5 5.6  HGB 10.8* 10.3* 9.8* 10.6* 8.8*  HCT 34.3* 32.8* 30.9* 34.2* 28.4*  MCV 88.4 88.2 88.3 88.1 89.6  PLT 547* 533* 494* 551* 440*   Basic Metabolic Panel: Recent Labs  Lab 11/06/19 0433 11/07/19 0904 11/08/19 0407 11/09/19 0500  NA 139  --   --  135  K 4.7  --   --  4.1  CL 102  --   --  98  CO2 27  --   --  28  GLUCOSE 103*  --   --  108*  BUN 8  --   --  11  CREATININE 0.61  --   --  0.59  CALCIUM 9.0  --   --  9.0  MG 2.0 2.0 2.1  --    GFR: Estimated Creatinine Clearance: 74.4 mL/min (by C-G formula based on SCr of 0.59 mg/dL). Liver  Function Tests: Recent Labs  Lab 11/06/19 0433  AST 40  ALT 27  ALKPHOS 85  BILITOT 0.3  PROT 6.8  ALBUMIN 2.5*   No results for input(s): LIPASE, AMYLASE in the last 168 hours. No results for input(s): AMMONIA in the last 168 hours. Coagulation Profile: No results for input(s): INR, PROTIME in the last 168 hours. Cardiac Enzymes: No results for input(s): CKTOTAL, CKMB, CKMBINDEX, TROPONINI in the last 168 hours. BNP (last 3 results) No results for input(s): PROBNP in the last 8760 hours. HbA1C: No results for input(s): HGBA1C in the last 72 hours. CBG: No results for input(s): GLUCAP in the last 168 hours. Lipid Profile: No results for input(s): CHOL, HDL, LDLCALC, TRIG, CHOLHDL, LDLDIRECT in the last 72 hours. Thyroid Function Tests: No results for input(s): TSH, T4TOTAL, FREET4, T3FREE, THYROIDAB in the last 72 hours. Anemia Panel: No results for input(s): VITAMINB12, FOLATE, FERRITIN, TIBC, IRON, RETICCTPCT in the last 72 hours.    Radiology Studies: I have reviewed all of the imaging during this hospital visit personally     Scheduled  Meds: . acetaminophen  1,000 mg Oral TID  . amphetamine-dextroamphetamine  30 mg Oral BID  . ARIPiprazole  20 mg Oral Daily  . buPROPion  150 mg Oral Daily  . Chlorhexidine Gluconate Cloth  6 each Topical Q0600  . diclofenac Sodium  4 g Topical QID  . feeding supplement (ENSURE ENLIVE)  237 mL Oral BID BM  . gabapentin  100 mg Oral TID  . [START ON 11/13/2019] influenza vac split quadrivalent PF  0.5 mL Intramuscular Tomorrow-1000  . multivitamin with minerals  1 tablet Oral Daily  . nicotine  14 mg Transdermal Daily  . rifabutin  300 mg Oral Daily  . rivaroxaban  15 mg Oral BID WC   Followed by  . [START ON 11/24/2019] rivaroxaban  20 mg Oral Daily  . senna-docusate  1 tablet Oral BID  . sodium chloride flush  10-40 mL Intracatheter Q12H  . sodium chloride flush  3 mL Intravenous Q12H   Continuous Infusions: . sodium  chloride Stopped (10/24/19 1012)  . sodium chloride Stopped (11/10/19 1630)  . vancomycin 1,250 mg (11/12/19 0429)     LOS: 44 days        Jakai Onofre Annett Gula, MD

## 2019-11-13 LAB — C-REACTIVE PROTEIN: CRP: 1.4 mg/dL — ABNORMAL HIGH (ref <1.0)

## 2019-11-13 MED ORDER — GABAPENTIN 300 MG PO CAPS
300.0000 mg | ORAL_CAPSULE | Freq: Three times a day (TID) | ORAL | Status: DC
  Administered 2019-11-13: 22:00:00 300 mg via ORAL
  Filled 2019-11-13 (×2): qty 1

## 2019-11-13 NOTE — Progress Notes (Signed)
PROGRESS NOTE    Colleen Ramirez  WFU:[id number] DOB: 02-16-1981 DOA: 09/29/2019 PCP: Patient, No Pcp Per    Brief Narrative:  Patient was admitted to the hospital with working diagnosis of sepsis secondary to cervical epiduralMRSAabscesswith discitis vertebral osteomyelitis.Complicated by right arm deep vein thrombosis. Now sp discectomy and fusion of the cervical spine.   39 year old female with significant past medical history for IV drug abuse, anxiety, depression, anemia and status post gastric bypass. She reported 2-day history of severe sharp neck pain, worse with movement. Recent hospitalization 08/18/19 to12/8/24 facial cellulitis.On her initial physical examination blood pressure 114/85, heart rate 105, respirate 16, temperature 99.8, oxygen saturation 97%, her lungs were clear to auscultation bilaterally, heart S1-S2 present rhythmic, abdomen soft, no lower extremity edema, neurologically nonfocal.  MRI cervical spine with discitis osteomyelitis at C5-6 and C6-7. Pronounced prevertebral/retropharyngeal soft tissue phlegmonous inflammation with areas of nonenhancing material consistent with pus. Epidural phlegmonusinflammation from C3-T1 with a few small areas of nonenhancing material particularly at the C5-6 and C6-7 level consistent with early epidural abscess.  01/12,patient underwent cervical 5 through cervical 6 anterior discectomy and instrumented fusion.Cultures were positive for MRSA, patient treated with vancomycin and rifampin.  02/14Follow-up cervical MRI with progression of findings consistent with discitis/osteomyelitis involving C4, C5, C6 and C7 vertebrae. Persistent ventral greater than dorsal epidural phlegmon spanning the C2-C7 levels, persistent vertebral, retropharyngeal phlegmon.  Recommendations to follow-up with aggressive medical therapy,IV antibiotics until March 26.  I talked to her about buprenorphine, that can be an option for discharge,  patient will need pain clinic for follow up. Patient prefers to have better pain control before starting buprenorphine    Assessment & Plan:   Principal Problem:   Abscess in epidural space of cervical spine Active Problems:   Depression   Acquired fusion of cervical spine   IVDU (intravenous drug user)   Homeless   S/P cervical discectomy   Anxiety   Status post gastric bypass for obesity   Hepatitis C antibody positive in blood   Acute bilateral thoracic back pain   Body aches   Methamphetamine abuse (HCC)   Pain of right sternoclavicular joint    1. Cervical spine epidural abscess with vertebral osteomyelitis/ MRSA. C4 to C7/ sp fusion. IV drug abuse/ hepatitis C.  Pain control with acetaminophen, hydromorphone (4 mg q 4 H prn) and gabapentin. Worsening pain this am. CRP trending down to 1,4 from 3,2. No indication to repeat crvival MRI today.   Antibiotic therapy with good toleration with IV vancomycin/ rifabutin, end date March 26.   Will increase dose of gabapentin to 300 mg tid.   2. Right upper extremity superficial and deep vein thrombosis/ cephalic and brachial veins. Left superficial vein thrombosis, basilic and cephalic vein. No significant pain on upper extremities Continue anticoagulation with rivaroxaban. Patient has a central line on the right IJ for antibiotic therapy.   3. Depression and anxiety/ tobacco abuse. Continue with adderall, aripiprazole, trazodone and bupropion, plus PRN diazepam.  No agitation, but positive crying this am.   4. Chronic multifactorial anemia. Continue to be stable.    DVT prophylaxis:rivaroxaban Code Status:full Family Communication:no family at the bedside Disposition Plan/ discharge barriers:patient will need to complete antibiotic therapy IV until March 26   Nutrition Status: Nutrition Problem: Increased nutrient needs Etiology: post-op healing Signs/Symptoms: estimated needs Interventions: Ensure  Enlive (each supplement provides 350kcal and 20 grams of protein), MVI     Skin Documentation:     Consultants:   Neurosurgery  ID   Procedures:   c4 to c7 fusion  Antimicrobials:   Vancomycin and rifabutin    Subjective: Patient complains of neck pain, more intense this am, no nausea or vomiting, no chest pain or dyspnea. When I enter the room look comfortable but as we talk she complained of more pain.   Objective: Vitals:   11/12/19 1500 11/12/19 1935 11/13/19 0431 11/13/19 0737  BP: 105/70 95/73 119/66 (!) 108/95  Pulse: (!) 105 96 93 98  Resp: 17 16 16 17   Temp: 98.2 F (36.8 C) 98.2 F (36.8 C) 97.7 F (36.5 C) 97.6 F (36.4 C)  TempSrc: Oral Oral Oral Oral  SpO2: 100% 100% 100% 100%  Weight:      Height:        Intake/Output Summary (Last 24 hours) at 11/13/2019 1041 Last data filed at 11/13/2019 0900 Gross per 24 hour  Intake 2432.11 ml  Output --  Net 2432.11 ml   Filed Weights   09/29/19 2032  Weight: 49.9 kg    Examination:   General: Not in pain or dyspnea. Deconditioned  Neurology: Awake and alert, non focal  E ENT: no pallor, no icterus, oral mucosa moist Cardiovascular: No JVD. S1-S2 present, rhythmic, no gallops, rubs, or murmurs. No lower extremity edema. Pulmonary: positive breath sounds bilaterally, adequate air movement, no wheezing, rhonchi or rales. Gastrointestinal. Abdomen with no organomegaly, non tender, no rebound or guarding Skin. No rashes Musculoskeletal: no joint deformities/ no local edema or erythema at neck. Mobility limited.      Data Reviewed: I have personally reviewed following labs and imaging studies  CBC: Recent Labs  Lab 11/08/19 0407 11/09/19 1124 11/10/19 0321 11/11/19 1226 11/12/19 0419  WBC 7.0 7.0 6.8 7.5 5.6  HGB 10.8* 10.3* 9.8* 10.6* 8.8*  HCT 34.3* 32.8* 30.9* 34.2* 28.4*  MCV 88.4 88.2 88.3 88.1 89.6  PLT 547* 533* 494* 551* 440*   Basic Metabolic Panel: Recent Labs  Lab  11/07/19 0904 11/08/19 0407 11/09/19 0500  NA  --   --  135  K  --   --  4.1  CL  --   --  98  CO2  --   --  28  GLUCOSE  --   --  108*  BUN  --   --  11  CREATININE  --   --  0.59  CALCIUM  --   --  9.0  MG 2.0 2.1  --    GFR: Estimated Creatinine Clearance: 74.4 mL/min (by C-G formula based on SCr of 0.59 mg/dL). Liver Function Tests: No results for input(s): AST, ALT, ALKPHOS, BILITOT, PROT, ALBUMIN in the last 168 hours. No results for input(s): LIPASE, AMYLASE in the last 168 hours. No results for input(s): AMMONIA in the last 168 hours. Coagulation Profile: No results for input(s): INR, PROTIME in the last 168 hours. Cardiac Enzymes: No results for input(s): CKTOTAL, CKMB, CKMBINDEX, TROPONINI in the last 168 hours. BNP (last 3 results) No results for input(s): PROBNP in the last 8760 hours. HbA1C: No results for input(s): HGBA1C in the last 72 hours. CBG: No results for input(s): GLUCAP in the last 168 hours. Lipid Profile: No results for input(s): CHOL, HDL, LDLCALC, TRIG, CHOLHDL, LDLDIRECT in the last 72 hours. Thyroid Function Tests: No results for input(s): TSH, T4TOTAL, FREET4, T3FREE, THYROIDAB in the last 72 hours. Anemia Panel: No results for input(s): VITAMINB12, FOLATE, FERRITIN, TIBC, IRON, RETICCTPCT in the last 72 hours.    Radiology Studies: I have  reviewed all of the imaging during this hospital visit personally     Scheduled Meds: . acetaminophen  1,000 mg Oral TID  . amphetamine-dextroamphetamine  30 mg Oral BID  . ARIPiprazole  20 mg Oral Daily  . buPROPion  150 mg Oral Daily  . Chlorhexidine Gluconate Cloth  6 each Topical Q0600  . diclofenac Sodium  4 g Topical QID  . feeding supplement (ENSURE ENLIVE)  237 mL Oral BID BM  . gabapentin  100 mg Oral TID  . influenza vac split quadrivalent PF  0.5 mL Intramuscular Tomorrow-1000  . multivitamin with minerals  1 tablet Oral Daily  . nicotine  14 mg Transdermal Daily  . rifabutin  300 mg  Oral Daily  . rivaroxaban  15 mg Oral BID WC   Followed by  . [START ON 11/24/2019] rivaroxaban  20 mg Oral Daily  . senna-docusate  1 tablet Oral BID  . sodium chloride flush  10-40 mL Intracatheter Q12H  . sodium chloride flush  3 mL Intravenous Q12H   Continuous Infusions: . sodium chloride Stopped (10/24/19 1012)  . sodium chloride Stopped (11/10/19 1630)  . vancomycin Stopped (11/13/19 4967)     LOS: 45 days        Trindon Dorton Annett Gula, MD

## 2019-11-13 NOTE — Progress Notes (Signed)
Pharmacy Antibiotic Note  Colleen Ramirez is a 39 y.o. female admitted on 09/29/2019 with MRSA cervical epidural abscess/osteo.  Pharmacy has been consulted for Vancomycin dosing. - Xarelto dosing for +DVT   Anticoag: acute RUE DVT (cephalic/brachial)  - Hgb 8.8 down, pltc high, no AC PTA  >LMWH full dose x 7 days> switched to Xarelto 15 mg bidwc x 14 days only due to LMWH then on 3/9 reduce to 20mg  daily . - Pt is homeless, TOC consult to eval for resources/copay for outp Xarelto. - Pt to remain in hospital for IV abx (through 12/11/19)  ID: Abx for MRSA cervical epidural abscess/osteo now s/p surgical decompression, discectomy and fusion 1/12. Echo negative for vegetations. 6 weeks of abx. No PICC line due to PSA. Discharge on doxycycline/rifabutin  (rifampn chg'd to rifabutin d/t starting Xarelto) - 2/14: MRI: progression of discitis/osteo of C4-C7. Persistent ventral greater than dorsal epidural phlegmon spanning the C2-C7 levels, persistent vertebral, retropharyngeal phlegmon  - afebrile, WBC WNL, Scr WNL - Dose not charted 2/25 PM (SZP done)  1/12 Zosyn>>1/13 1/12 Vanc >> (3/26) 1/14 Rifampin switched 2/22 to Rifabutin d/t DOAC>> (3/26)  Bactroban nasal 1/15 >> 1/25  Vanc levels: 2/23: VP 17, VT 8 >> AUC 386, (on 1g q12h)>change 1250 Q12 for calc'd AUC 480  1/12 cervical abscess: abundant MRSA, MIC to Vanc < 0.5 1/14 HCV+ 1/14 MRSA PCR positive 2/15 blood x 2: neg   Plan: - Vanc  1250 Q12 for calc'd AUC 480.  (dose adj 2/23) (Changed 2/22 from Rifampin BID to Rifabutin 300mg  PO QD d/t to switch to Xarelto 2/22, no DDI with rifabutin per Micromedex, and lexicomp, ID pharm,) - No levels today because dose was not charted as given 2/25 PM. - Order BMET q 72hrs. - IV abx stop date 3/26  - Xarelto 15 mg bidwc x 14 days  (received full dose lovenox x 7 days) then on 3/9 reduce to 20mg  daily. AVS posted.  Needs Xarelto education (hospitalized through 3/26) F/u TOC consult Xarelto  copay (homeless, no med resources)      Height: 5\' 3"  (160 cm) Weight: 110 lb (49.9 kg) IBW/kg (Calculated) : 52.4  Temp (24hrs), Avg:97.9 F (36.6 C), Min:97.6 F (36.4 C), Max:98.2 F (36.8 C)  Recent Labs  Lab 11/08/19 0407 11/08/19 2035 11/09/19 0500 11/09/19 1124 11/09/19 1555 11/09/19 2057 11/10/19 0321 11/11/19 1226 11/12/19 0419  WBC 7.0  --   --  7.0  --   --  6.8 7.5 5.6  CREATININE  --   --  0.59  --   --   --   --   --   --   VANCOTROUGH  --   --   --   --   --   --  8*  --   --   VANCOPEAK  --    < >  --   --  7* 17*  --   --   --    < > = values in this interval not displayed.    Estimated Creatinine Clearance: 74.4 mL/min (by C-G formula based on SCr of 0.59 mg/dL).    No Known Allergies  Colleen Aburto S. 11/11/19, PharmD, BCPS Clinical Staff Pharmacist [email or web address]  11/12/19 11/13/2019 8:48 AM

## 2019-11-14 LAB — BASIC METABOLIC PANEL
Anion gap: 8 (ref 5–15)
BUN: 9 mg/dL (ref 6–20)
CO2: 25 mmol/L (ref 22–32)
Calcium: 8.7 mg/dL — ABNORMAL LOW (ref 8.9–10.3)
Chloride: 102 mmol/L (ref 98–111)
Creatinine, Ser: 0.51 mg/dL (ref 0.44–1.00)
GFR calc Af Amer: >60 mL/min (ref >60)
GFR calc non Af Amer: >60 mL/min (ref >60)
Glucose, Bld: 152 mg/dL — ABNORMAL HIGH (ref 70–99)
Potassium: 3.8 mmol/L (ref 3.5–5.1)
Sodium: 135 mmol/L (ref 135–145)

## 2019-11-14 LAB — C-REACTIVE PROTEIN: CRP: 4.7 mg/dL — ABNORMAL HIGH (ref <1.0)

## 2019-11-14 MED ORDER — HYDROMORPHONE HCL 2 MG PO TABS
4.0000 mg | ORAL_TABLET | ORAL | Status: DC | PRN
  Administered 2019-11-14 – 2019-12-01 (×120): 4 mg via ORAL
  Filled 2019-11-14 (×121): qty 2

## 2019-11-14 MED ORDER — GABAPENTIN 100 MG PO CAPS
100.0000 mg | ORAL_CAPSULE | Freq: Three times a day (TID) | ORAL | Status: DC
  Administered 2019-11-14 – 2019-12-11 (×82): 100 mg via ORAL
  Filled 2019-11-14 (×83): qty 1

## 2019-11-14 NOTE — Plan of Care (Signed)
  Problem: Clinical Measurements: Goal: Will remain free from infection Outcome: Progressing Goal: Diagnostic test results will improve Outcome: Progressing Goal: Respiratory complications will improve Outcome: Progressing Goal: Cardiovascular complication will be avoided Outcome: Progressing   Problem: Activity: Goal: Risk for activity intolerance will decrease Outcome: Progressing   Problem: Nutrition: Goal: Adequate nutrition will be maintained Outcome: Progressing   Problem: Coping: Goal: Level of anxiety will decrease Outcome: Progressing   Problem: Elimination: Goal: Will not experience complications related to bowel motility Outcome: Progressing Goal: Will not experience complications related to urinary retention Outcome: Progressing   Problem: Pain Managment: Goal: General experience of comfort will improve Outcome: Progressing   Problem: Safety: Goal: Ability to remain free from injury will improve Outcome: Progressing   Problem: Skin Integrity: Goal: Risk for impaired skin integrity will decrease Outcome: Progressing   

## 2019-11-14 NOTE — Progress Notes (Signed)
Patient requested we assess her temperature again this afternoon because she felt feverish and was having chills.   Temp 102.0 orally. MD Arrien at bedside and made aware. Patient given PO tylenol per order. Will continue to monitor.

## 2019-11-14 NOTE — Progress Notes (Addendum)
PROGRESS NOTE    Colleen Ramirez  UJW:[id number] DOB: 1981/05/18 DOA: 09/29/2019 PCP: Patient, No Pcp Per    Brief Narrative:  Patient was admitted to the hospital with working diagnosis of sepsis secondary to cervical epiduralMRSAabscesswith discitis vertebral osteomyelitis.Complicated by right arm deep vein thrombosis. Now sp discectomy and fusion of the cervical spine.   39 year old female with significant past medical history for IV drug abuse, anxiety, depression, anemia and status post gastric bypass. She reported 2-day history of severe sharp neck pain, worse with movement. Recent hospitalization 08/18/19 to12/8/24 facial cellulitis.On her initial physical examination blood pressure 114/85, heart rate 105, respirate 16, temperature 99.8, oxygen saturation 97%, her lungs were clear to auscultation bilaterally, heart S1-S2 present rhythmic, abdomen soft, no lower extremity edema, neurologically nonfocal.  MRI cervical spine with discitis osteomyelitis at C5-6 and C6-7. Pronounced prevertebral/retropharyngeal soft tissue phlegmonous inflammation with areas of nonenhancing material consistent with pus. Epidural phlegmonusinflammation from C3-T1 with a few small areas of nonenhancing material particularly at the C5-6 and C6-7 level consistent with early epidural abscess.  01/12,patient underwent cervical 5 through cervical 6 anterior discectomy and instrumented fusion.Cultures were positive for MRSA, patient treated with vancomycin and rifampin.  02/14Follow-up cervical MRI with progression of findings consistent with discitis/osteomyelitis involving C4, C5, C6 and C7 vertebrae. Persistent ventral greater than dorsal epidural phlegmon spanning the C2-C7 levels, persistent vertebral, retropharyngeal phlegmon.  Recommendations to follow-up with aggressive medical therapy,IV antibiotics until March 26.  I talked to her about buprenorphine, that can be an option for  discharge, patient will need pain clinic for follow up.Patient prefers to have better pain control before starting buprenorphine  Patient has a central line on the right IJ for antibiotic therapy, due to bilateral upper extremities venous thrombosis.   Assessment & Plan:   Principal Problem:   Abscess in epidural space of cervical spine Active Problems:   Depression   Acquired fusion of cervical spine   IVDU (intravenous drug user)   Homeless   S/P cervical discectomy   Anxiety   Status post gastric bypass for obesity   Hepatitis C antibody positive in blood   Acute bilateral thoracic back pain   Body aches   Methamphetamine abuse (HCC)   Pain of right sternoclavicular joint     1. Cervical spine epidural abscess with vertebral osteomyelitis/ MRSA. C4 to C7/ sp fusion. IV drug abuse/ hepatitis C.  Continue pain control with acetaminophen, hydromorphone (increased to 4 mg q 3H prn) and gabapentin 100 mg tid, did not tolerate higher dose of 300 ng tid.  CRP this am is 4,7, temp max 101.0,   Will continue antibiotic therapy IV vancomycin/ and po rifabutin, end date March 26. Low threshold to repeat cervical spine MRI. Will check cell count, CRP and ESR, in am. Check repeat blood cultures.  Out of bed to chair tid with meals and ambulation as tolerated.   2. Right upper extremity superficial and deep vein thrombosis/ cephalic and brachial veins.Left superficial vein thrombosis, basilic and cephalic vein. No significant pain on upper extremities Anticoagulation with rivaroxaban with good toleration.    3. Depression and anxiety/ tobacco abuse.Onadderall, aripiprazole, trazodone and bupropion, plus PRN diazepam.   4. Chronic multifactorial anemia.Will check cell count in am.    DVT prophylaxis:rivaroxaban Code Status:full Family Communication:no family at the bedside Disposition Plan/ discharge barriers:patient will need to complete antibiotic therapy  IV until March 26   Nutrition Status: Nutrition Problem: Increased nutrient needs Etiology: post-op healing Signs/Symptoms: estimated needs Interventions:  Ensure Enlive (each supplement provides 350kcal and 20 grams of protein), MVI    Consultants:   Neurosurgery   ID   Procedures:   c4 to c7 fusion  Antimicrobials:   Vancomycin and rifabutin   Subjective: Patient continue to have neck pain, worse with movement, she has been ambulating. Feeling more depressed today. Tolerating po well, no nausea or vomiting.   Objective: Vitals:   11/13/19 1711 11/13/19 2054 11/14/19 0433 11/14/19 1247  BP: 118/80 95/65 107/76   Pulse: 98 98 99   Resp: '16 18 17   '$ Temp: 99.3 F (37.4 C) 98.4 F (36.9 C) 97.8 F (36.6 C) (!) 102 F (38.9 C)  TempSrc: Oral Oral  Oral  SpO2: 100% 100% 100%   Weight:      Height:        Intake/Output Summary (Last 24 hours) at 11/14/2019 1249 Last data filed at 11/14/2019 0900 Gross per 24 hour  Intake 1330 ml  Output --  Net 1330 ml   Filed Weights   09/29/19 2032  Weight: 49.9 kg    Examination:   General: Not in pain or dyspnea.  Neurology: Awake and alert, non focal  E ENT: no pallor, no icterus, oral mucosa moist Cardiovascular: No JVD. S1-S2 present, rhythmic, no gallops, rubs, or murmurs. No lower extremity edema. Pulmonary: positive breath sounds bilaterally, adequate air movement, no wheezing, rhonchi or rales. Gastrointestinal. Abdomen with no organomegaly, non tender, no rebound or guarding Skin. No rashes Musculoskeletal: no joint deformities     Data Reviewed: I have personally reviewed following labs and imaging studies  CBC: Recent Labs  Lab 11/08/19 0407 11/09/19 1124 11/10/19 0321 11/11/19 1226 11/12/19 0419  WBC 7.0 7.0 6.8 7.5 5.6  HGB 10.8* 10.3* 9.8* 10.6* 8.8*  HCT 34.3* 32.8* 30.9* 34.2* 28.4*  MCV 88.4 88.2 88.3 88.1 89.6  PLT 547* 533* 494* 551* 563*   Basic Metabolic Panel: Recent Labs  Lab  11/08/19 0407 11/09/19 0500 11/14/19 0353  NA  --  135 135  K  --  4.1 3.8  CL  --  98 102  CO2  --  28 25  GLUCOSE  --  108* 152*  BUN  --  11 9  CREATININE  --  0.59 0.51  CALCIUM  --  9.0 8.7*  MG 2.1  --   --    GFR: Estimated Creatinine Clearance: 74.4 mL/min (by C-G formula based on SCr of 0.51 mg/dL). Liver Function Tests: No results for input(s): AST, ALT, ALKPHOS, BILITOT, PROT, ALBUMIN in the last 168 hours. No results for input(s): LIPASE, AMYLASE in the last 168 hours. No results for input(s): AMMONIA in the last 168 hours. Coagulation Profile: No results for input(s): INR, PROTIME in the last 168 hours. Cardiac Enzymes: No results for input(s): CKTOTAL, CKMB, CKMBINDEX, TROPONINI in the last 168 hours. BNP (last 3 results) No results for input(s): PROBNP in the last 8760 hours. HbA1C: No results for input(s): HGBA1C in the last 72 hours. CBG: No results for input(s): GLUCAP in the last 168 hours. Lipid Profile: No results for input(s): CHOL, HDL, LDLCALC, TRIG, CHOLHDL, LDLDIRECT in the last 72 hours. Thyroid Function Tests: No results for input(s): TSH, T4TOTAL, FREET4, T3FREE, THYROIDAB in the last 72 hours. Anemia Panel: No results for input(s): VITAMINB12, FOLATE, FERRITIN, TIBC, IRON, RETICCTPCT in the last 72 hours.    Radiology Studies: I have reviewed all of the imaging during this hospital visit personally     Scheduled Meds: .  acetaminophen  1,000 mg Oral TID  . amphetamine-dextroamphetamine  30 mg Oral BID  . ARIPiprazole  20 mg Oral Daily  . buPROPion  150 mg Oral Daily  . Chlorhexidine Gluconate Cloth  6 each Topical Q0600  . diclofenac Sodium  4 g Topical QID  . feeding supplement (ENSURE ENLIVE)  237 mL Oral BID BM  . gabapentin  300 mg Oral TID  . influenza vac split quadrivalent PF  0.5 mL Intramuscular Tomorrow-1000  . multivitamin with minerals  1 tablet Oral Daily  . nicotine  14 mg Transdermal Daily  . rifabutin  300 mg Oral  Daily  . rivaroxaban  15 mg Oral BID WC   Followed by  . [START ON 11/24/2019] rivaroxaban  20 mg Oral Daily  . senna-docusate  1 tablet Oral BID  . sodium chloride flush  10-40 mL Intracatheter Q12H  . sodium chloride flush  3 mL Intravenous Q12H   Continuous Infusions: . sodium chloride Stopped (10/24/19 1012)  . sodium chloride 250 mL (11/13/19 2315)  . vancomycin 1,250 mg (11/14/19 0544)     LOS: 46 days        Koden Hunzeker Gerome Apley, MD

## 2019-11-15 LAB — CBC WITH DIFFERENTIAL/PLATELET
Abs Immature Granulocytes: 0.03 10*3/uL (ref 0.00–0.07)
Basophils Absolute: 0 10*3/uL (ref 0.0–0.1)
Basophils Relative: 1 %
Eosinophils Absolute: 0.1 10*3/uL (ref 0.0–0.5)
Eosinophils Relative: 2 %
HCT: 28.7 % — ABNORMAL LOW (ref 36.0–46.0)
Hemoglobin: 9 g/dL — ABNORMAL LOW (ref 12.0–15.0)
Immature Granulocytes: 1 %
Lymphocytes Relative: 21 %
Lymphs Abs: 1.4 10*3/uL (ref 0.7–4.0)
MCH: 27.4 pg (ref 26.0–34.0)
MCHC: 31.4 g/dL (ref 30.0–36.0)
MCV: 87.5 fL (ref 80.0–100.0)
Monocytes Absolute: 0.5 10*3/uL (ref 0.1–1.0)
Monocytes Relative: 8 %
Neutro Abs: 4.4 10*3/uL (ref 1.7–7.7)
Neutrophils Relative %: 67 %
Platelets: 416 10*3/uL — ABNORMAL HIGH (ref 150–400)
RBC: 3.28 MIL/uL — ABNORMAL LOW (ref 3.87–5.11)
RDW: 13.6 % (ref 11.5–15.5)
WBC: 6.6 10*3/uL (ref 4.0–10.5)
nRBC: 0 % (ref 0.0–0.2)

## 2019-11-15 LAB — BASIC METABOLIC PANEL
Anion gap: 8 (ref 5–15)
BUN: 8 mg/dL (ref 6–20)
CO2: 26 mmol/L (ref 22–32)
Calcium: 8.7 mg/dL — ABNORMAL LOW (ref 8.9–10.3)
Chloride: 103 mmol/L (ref 98–111)
Creatinine, Ser: 0.54 mg/dL (ref 0.44–1.00)
GFR calc Af Amer: >60 mL/min (ref >60)
GFR calc non Af Amer: >60 mL/min (ref >60)
Glucose, Bld: 99 mg/dL (ref 70–99)
Potassium: 3.8 mmol/L (ref 3.5–5.1)
Sodium: 137 mmol/L (ref 135–145)

## 2019-11-15 LAB — VANCOMYCIN, PEAK: Vancomycin Pk: 20 ug/mL — ABNORMAL LOW (ref 30–40)

## 2019-11-15 LAB — SEDIMENTATION RATE: Sed Rate: 60 mm/hr — ABNORMAL HIGH (ref 0–22)

## 2019-11-15 LAB — C-REACTIVE PROTEIN: CRP: 7.6 mg/dL — ABNORMAL HIGH (ref <1.0)

## 2019-11-15 NOTE — Plan of Care (Signed)
  Problem: Activity: Goal: Risk for activity intolerance will decrease Outcome: Progressing   Problem: Coping: Goal: Level of anxiety will decrease Outcome: Progressing   Problem: Pain Managment: Goal: General experience of comfort will improve Outcome: Progressing   Problem: Safety: Goal: Ability to remain free from injury will improve Outcome: Progressing   Problem: Skin Integrity: Goal: Risk for impaired skin integrity will decrease Outcome: Progressing   

## 2019-11-15 NOTE — Progress Notes (Signed)
PROGRESS NOTE    Colleen Ramirez  PZP:[id number] DOB: 1981-01-10 DOA: 09/29/2019 PCP: Patient, No Pcp Per    Brief Narrative:  Patient was admitted to the hospital with working diagnosis of sepsis secondary to cervical epiduralMRSAabscesswith discitis vertebral osteomyelitis.Complicated by right arm deep vein thrombosis.Now sp discectomy and fusion of the cervical spine.  39 year old female with significant past medical history for IV drug abuse, anxiety, depression, anemia and status post gastric bypass. She reported 2-day history of severe sharp neck pain, worse with movement. Recent hospitalization 08/18/19 to12/8/24 facial cellulitis.On her initial physical examination blood pressure 114/85, heart rate 105, respirate 16, temperature 99.8, oxygen saturation 97%, her lungs were clear to auscultation bilaterally, heart S1-S2 present rhythmic, abdomen soft, no lower extremity edema, neurologically nonfocal.  MRI cervical spine with discitis osteomyelitis at C5-6 and C6-7. Pronounced prevertebral/retropharyngeal soft tissue phlegmonous inflammation with areas of nonenhancing material consistent with pus. Epidural phlegmonusinflammation from C3-T1 with a few small areas of nonenhancing material particularly at the C5-6 and C6-7 level consistent with early epidural abscess.  01/12,patient underwent cervical 5 through cervical 6 anterior discectomy and instrumented fusion.Cultures were positive for MRSA, patient treated with vancomycin and rifampin.  02/14Follow-up cervical MRI with progression of findings consistent with discitis/osteomyelitis involving C4, C5, C6 and C7 vertebrae. Persistent ventral greater than dorsal epidural phlegmon spanning the C2-C7 levels, persistent vertebral, retropharyngeal phlegmon.  Recommendations to follow-up with aggressive medical therapy,IV antibiotics until March 26.  I talked to her about buprenorphine, that can be an option for  discharge, patient will need pain clinic for follow up.Patient prefers to have better pain control before starting buprenorphine  Patient has a central line on the right IJ for antibiotic therapy, due to bilateral upper extremities venous thrombosis.    Assessment & Plan:   Principal Problem:   Abscess in epidural space of cervical spine Active Problems:   Depression   Acquired fusion of cervical spine   IVDU (intravenous drug user)   Homeless   S/P cervical discectomy   Anxiety   Status post gastric bypass for obesity   Hepatitis C antibody positive in blood   Acute bilateral thoracic back pain   Body aches   Methamphetamine abuse (HCC)   Pain of right sternoclavicular joint   1. Cervical spine epidural abscess with vertebral osteomyelitis/ MRSA. C4 to C7/ sp fusion. IV drug abuse/ hepatitis C.  Her neck pain has improved with q 3 H hydromorphone, will continue with acetaminophen and gabapentin 100 mg tid. Patient has history of gastric ulcers with non steroidal antiinflammatory agentes.   No further fever but CRP is up to 7,6 and sed rate 60. Blood cultures repeat from 02/27 with no growth.   Continue antibiotic therapy IV vancomycin/ and po rifabutin, scheduled end date March 26.   If worsening inflammatory markers will consider to repeat cervical MRI.   2. Right upper extremity superficial and deep vein thrombosis/ cephalic and brachial veins.Left superficial vein thrombosis, basilic and cephalic vein.No significant pain onupper extremities. Tolerating well anticoagulation with rivaroxaban.   3. Depression and anxiety/ tobacco abuse.Continue with adderall, aripiprazole, trazodone and bupropion, plusPRN diazepam.   4. Chronic multifactorial anemia.Hgb stable at 9,0 with Hct at 28.7   DVT prophylaxis:rivaroxaban Code Status:full Family Communication:no family at the bedside Disposition Plan/ discharge barriers:patient will need to complete  antibiotic therapy IV until March 26   Nutrition Status: Nutrition Problem: Increased nutrient needs Etiology: post-op healing Signs/Symptoms: estimated needs Interventions: Ensure Enlive (each supplement provides 350kcal and 20 grams  of protein), MVI    Consultants:  Neurosurgery   ID  Procedures:  c4 to c7 fusion  Antimicrobials:  Vancomycin and rifabutin   Subjective: Patient able to sleep better last night, her neck pain has improved and she has been afebrile, no nausea or vomiting, no chest pain or dyspnea.   Objective: Vitals:   11/14/19 1345 11/14/19 1444 11/14/19 1958 11/15/19 0341  BP:  112/80 101/75 100/67  Pulse:  96 (!) 106 100  Resp:  17 17 18   Temp: (!) 101 F (38.3 C) 100 F (37.8 C) 99.3 F (37.4 C) 97.9 F (36.6 C)  TempSrc:  Oral Oral Oral  SpO2:  98% 100% 100%  Weight:      Height:        Intake/Output Summary (Last 24 hours) at 11/15/2019 1257 Last data filed at 11/15/2019 0900 Gross per 24 hour  Intake 2190.58 ml  Output --  Net 2190.58 ml   Filed Weights   09/29/19 2032  Weight: 49.9 kg    Examination:   General: Not in pain or dyspnea  Neurology: Awake and alert, non focal  E ENT: no pallor, no icterus, oral mucosa moist Cardiovascular: No JVD. S1-S2 present, rhythmic, no gallops, rubs, or murmurs. No lower extremity edema. Pulmonary: positive breath sounds bilaterally, adequate air movement, no wheezing, rhonchi or rales. Gastrointestinal. Abdomen with no organomegaly, non tender, no rebound or guarding Skin. No rashes Musculoskeletal: no joint deformities     Data Reviewed: I have personally reviewed following labs and imaging studies  CBC: Recent Labs  Lab 11/09/19 1124 11/10/19 0321 11/11/19 1226 11/12/19 0419 11/15/19 0431  WBC 7.0 6.8 7.5 5.6 6.6  NEUTROABS  --   --   --   --  4.4  HGB 10.3* 9.8* 10.6* 8.8* 9.0*  HCT 32.8* 30.9* 34.2* 28.4* 28.7*  MCV 88.2 88.3 88.1 89.6 87.5  PLT 533* 494* 551*  440* 416*   Basic Metabolic Panel: Recent Labs  Lab 11/09/19 0500 11/14/19 0353 11/15/19 0431  NA 135 135 137  K 4.1 3.8 3.8  CL 98 102 103  CO2 28 25 26   GLUCOSE 108* 152* 99  BUN 11 9 8   CREATININE 0.59 0.51 0.54  CALCIUM 9.0 8.7* 8.7*   GFR: Estimated Creatinine Clearance: 74.4 mL/min (by C-G formula based on SCr of 0.54 mg/dL). Liver Function Tests: No results for input(s): AST, ALT, ALKPHOS, BILITOT, PROT, ALBUMIN in the last 168 hours. No results for input(s): LIPASE, AMYLASE in the last 168 hours. No results for input(s): AMMONIA in the last 168 hours. Coagulation Profile: No results for input(s): INR, PROTIME in the last 168 hours. Cardiac Enzymes: No results for input(s): CKTOTAL, CKMB, CKMBINDEX, TROPONINI in the last 168 hours. BNP (last 3 results) No results for input(s): PROBNP in the last 8760 hours. HbA1C: No results for input(s): HGBA1C in the last 72 hours. CBG: No results for input(s): GLUCAP in the last 168 hours. Lipid Profile: No results for input(s): CHOL, HDL, LDLCALC, TRIG, CHOLHDL, LDLDIRECT in the last 72 hours. Thyroid Function Tests: No results for input(s): TSH, T4TOTAL, FREET4, T3FREE, THYROIDAB in the last 72 hours. Anemia Panel: No results for input(s): VITAMINB12, FOLATE, FERRITIN, TIBC, IRON, RETICCTPCT in the last 72 hours.    Radiology Studies: I have reviewed all of the imaging during this hospital visit personally     Scheduled Meds: . acetaminophen  1,000 mg Oral TID  . amphetamine-dextroamphetamine  30 mg Oral BID  . ARIPiprazole  20  mg Oral Daily  . buPROPion  150 mg Oral Daily  . Chlorhexidine Gluconate Cloth  6 each Topical Q0600  . diclofenac Sodium  4 g Topical QID  . feeding supplement (ENSURE ENLIVE)  237 mL Oral BID BM  . gabapentin  100 mg Oral TID  . influenza vac split quadrivalent PF  0.5 mL Intramuscular Tomorrow-1000  . multivitamin with minerals  1 tablet Oral Daily  . nicotine  14 mg Transdermal Daily    . rifabutin  300 mg Oral Daily  . rivaroxaban  15 mg Oral BID WC   Followed by  . [START ON 11/24/2019] rivaroxaban  20 mg Oral Daily  . senna-docusate  1 tablet Oral BID  . sodium chloride flush  10-40 mL Intracatheter Q12H  . sodium chloride flush  3 mL Intravenous Q12H   Continuous Infusions: . sodium chloride Stopped (10/24/19 1012)  . sodium chloride 250 mL (11/13/19 2315)  . vancomycin 1,250 mg (11/15/19 0558)     LOS: 47 days        Sanjuana Mruk Gerome Apley, MD

## 2019-11-16 ENCOUNTER — Telehealth: Payer: Self-pay | Admitting: Family Medicine

## 2019-11-16 ENCOUNTER — Inpatient Hospital Stay (HOSPITAL_COMMUNITY): Payer: Self-pay

## 2019-11-16 LAB — CBC WITH DIFFERENTIAL/PLATELET
Abs Immature Granulocytes: 0.03 10*3/uL (ref 0.00–0.07)
Basophils Absolute: 0.1 10*3/uL (ref 0.0–0.1)
Basophils Relative: 1 %
Eosinophils Absolute: 0.1 10*3/uL (ref 0.0–0.5)
Eosinophils Relative: 2 %
HCT: 27.9 % — ABNORMAL LOW (ref 36.0–46.0)
Hemoglobin: 9 g/dL — ABNORMAL LOW (ref 12.0–15.0)
Immature Granulocytes: 1 %
Lymphocytes Relative: 25 %
Lymphs Abs: 1.4 10*3/uL (ref 0.7–4.0)
MCH: 27.6 pg (ref 26.0–34.0)
MCHC: 32.3 g/dL (ref 30.0–36.0)
MCV: 85.6 fL (ref 80.0–100.0)
Monocytes Absolute: 0.3 10*3/uL (ref 0.1–1.0)
Monocytes Relative: 5 %
Neutro Abs: 3.6 10*3/uL (ref 1.7–7.7)
Neutrophils Relative %: 66 %
Platelets: 399 10*3/uL (ref 150–400)
RBC: 3.26 MIL/uL — ABNORMAL LOW (ref 3.87–5.11)
RDW: 13.4 % (ref 11.5–15.5)
WBC: 5.5 10*3/uL (ref 4.0–10.5)
nRBC: 0 % (ref 0.0–0.2)

## 2019-11-16 LAB — BASIC METABOLIC PANEL
Anion gap: 8 (ref 5–15)
BUN: 6 mg/dL (ref 6–20)
CO2: 27 mmol/L (ref 22–32)
Calcium: 8.8 mg/dL — ABNORMAL LOW (ref 8.9–10.3)
Chloride: 102 mmol/L (ref 98–111)
Creatinine, Ser: 0.55 mg/dL (ref 0.44–1.00)
GFR calc Af Amer: >60 mL/min (ref >60)
GFR calc non Af Amer: >60 mL/min (ref >60)
Glucose, Bld: 101 mg/dL — ABNORMAL HIGH (ref 70–99)
Potassium: 4 mmol/L (ref 3.5–5.1)
Sodium: 137 mmol/L (ref 135–145)

## 2019-11-16 LAB — C-REACTIVE PROTEIN: CRP: 8.4 mg/dL — ABNORMAL HIGH (ref <1.0)

## 2019-11-16 LAB — VANCOMYCIN, TROUGH: Vancomycin Tr: 6 ug/mL — ABNORMAL LOW (ref 15–20)

## 2019-11-16 LAB — SEDIMENTATION RATE: Sed Rate: 65 mm/hr — ABNORMAL HIGH (ref 0–22)

## 2019-11-16 MED ORDER — GADOBUTROL 1 MMOL/ML IV SOLN
5.0000 mL | Freq: Once | INTRAVENOUS | Status: AC | PRN
  Administered 2019-11-16: 5 mL via INTRAVENOUS

## 2019-11-16 NOTE — Progress Notes (Signed)
Pharmacy Antibiotic Note  Colleen Ramirez is a 39 y.o. female admitted on 09/29/2019 with MRSA cervical epidural abscess/osteo.  Pharmacy has been consulted for Vancomycin dosing.  ID: Abx for MRSA cervical epidural abscess/osteo now s/p surgical decompression, discectomy and fusion 1/12. Echo negative for vegetations. 6 weeks of abx. No PICC line due to PSA. Discharge on doxycycline/rifabutin  (rifampn chg'd to rifabutin d/t starting Xarelto) - 2/14: MRI: progression of discitis/osteo of C4-C7. Persistent ventral greater than dorsal epidural phlegmon spanning the C2-C7 levels, persistent vertebral, retropharyngeal phlegmon  - afebrile, WBC WNL, Scr WNL  1/12 Zosyn>>1/13 1/12 Vanc >> (3/26) 1/14 Rifampin switched 2/22 to Rifabutin d/t DOAC>> (3/26)  Vanc levels: 2/23: VP 17, VT 8 >> AUC 386, (on 1g q12h)>change 1250 Q12 for calc'd AUC 480 3/1: AUC is therapeutic at 424 on Vancomycin 1250 mg IV q12h  1/12 cervical abscess: abundant MRSA, MIC to Vanc < 0.5 1/14 HCV+ 1/14 MRSA PCR positive 2/15 blood x 2: neg   Plan: -Cont vancomycin at 1250 mg IV q12h -Re-check levels as needed - IV abx stop date 3/26   Height: 5\' 3"  (160 cm) Weight: 110 lb (49.9 kg) IBW/kg (Calculated) : 52.4  Temp (24hrs), Avg:98.5 F (36.9 C), Min:98.3 F (36.8 C), Max:98.7 F (37.1 C)  Recent Labs  Lab 11/09/19 1124 11/09/19 2057 11/10/19 0321 11/11/19 1226 11/12/19 0419 11/14/19 0353 11/15/19 0431 11/15/19 2133 11/16/19 0456  WBC   < >  --  6.8 7.5 5.6  --  6.6  --  5.5  CREATININE  --   --   --   --   --  0.51 0.54  --  0.55  VANCOTROUGH  --   --  8*  --   --   --   --   --  6*  VANCOPEAK  --  17*  --   --   --   --   --  20*  --    < > = values in this interval not displayed.    Estimated Creatinine Clearance: 74.4 mL/min (by C-G formula based on SCr of 0.55 mg/dL).    No Known Allergies  01/16/20, PharmD, BCPS Clinical Pharmacist Phone: [phone]

## 2019-11-16 NOTE — Plan of Care (Signed)
  Problem: Clinical Measurements: Goal: Will remain free from infection Outcome: Progressing Goal: Respiratory complications will improve Outcome: Progressing Note: On room air   Problem: Activity: Goal: Risk for activity intolerance will decrease Outcome: Progressing Note: Up independently, able to walk halls no problem   Problem: Nutrition: Goal: Adequate nutrition will be maintained Outcome: Progressing   Problem: Coping: Goal: Level of anxiety will decrease Outcome: Progressing   Problem: Pain Managment: Goal: General experience of comfort will improve Outcome: Progressing Note: Treated chronic neck/shoulder/back pain with dilaudid q3hr   Problem: Safety: Goal: Ability to remain free from injury will improve Outcome: Progressing

## 2019-11-16 NOTE — Telephone Encounter (Signed)
Pt was called no answer and pt is not accepting messages at this time

## 2019-11-16 NOTE — Progress Notes (Signed)
PROGRESS NOTE    Colleen Ramirez  TGG:[id number] DOB: May 29, 1981 DOA: 09/29/2019 PCP: Patient, No Pcp Per    Brief Narrative:  Patient was admitted to the hospital with working diagnosis of sepsis secondary to cervical epiduralMRSAabscesswith discitis vertebral osteomyelitis.Complicated by right arm deep vein thrombosis.Now sp discectomy and fusion of the cervical spine.  39 year old female with significant past medical history for IV drug abuse, anxiety, depression, anemia and status post gastric bypass. She reported 2-day history of severe sharp neck pain, worse with movement. Recent hospitalization 08/18/19 to12/8/24 facial cellulitis.On her initial physical examination blood pressure 114/85, heart rate 105, respirate 16, temperature 99.8, oxygen saturation 97%, her lungs were clear to auscultation bilaterally, heart S1-S2 present rhythmic, abdomen soft, no lower extremity edema, neurologically nonfocal.  MRI cervical spine with discitis osteomyelitis at C5-6 and C6-7. Pronounced prevertebral/retropharyngeal soft tissue phlegmonous inflammation with areas of nonenhancing material consistent with pus. Epidural phlegmonusinflammation from C3-T1 with a few small areas of nonenhancing material particularly at the C5-6 and C6-7 level consistent with early epidural abscess.  01/12,patient underwent cervical 5 through cervical 6 anterior discectomy and instrumented fusion.Cultures were positive for MRSA, patient treated with vancomycin and rifampin.  02/14Follow-up cervical MRI with progression of findings consistent with discitis/osteomyelitis involving C4, C5, C6 and C7 vertebrae. Persistent ventral greater than dorsal epidural phlegmon spanning the C2-C7 levels, persistent vertebral, retropharyngeal phlegmon.  Recommendations to follow-up with aggressive medical therapy,IV antibiotics until March 26.  I talked to her about buprenorphine, that can be an option for  discharge, patient will need pain clinic for follow up.Patient prefers to have better pain control before starting buprenorphine  Patient has a central line on the right IJ for antibiotic therapy, due to bilateral upper extremities venous thrombosis.   Assessment & Plan:   Principal Problem:   Abscess in epidural space of cervical spine Active Problems:   Depression   Acquired fusion of cervical spine   IVDU (intravenous drug user)   Homeless   S/P cervical discectomy   Anxiety   Status post gastric bypass for obesity   Hepatitis C antibody positive in blood   Acute bilateral thoracic back pain   Body aches   Methamphetamine abuse (HCC)   Pain of right sternoclavicular joint   1. Cervical spine epidural abscess with vertebral osteomyelitis/ MRSA. C4 to C7/ sp fusion. IV drug abuse/ hepatitis C.  Persistent neck pain on q 3 H hydromorphone prn, scheduled acetaminophen and gabapentin100 mg tid. Holding non steroidal antiinflammatory agents due to hx of peptic ulcer disease.    Blood cultures repeat from 02/27 with no growth. But CRP and Sed rate worsening to 8,4 and 65.   Considering persistent neck pain and worsening inflammatory markers, will proceed with cervical spine MRI today, for now will continue antibiotic therapy IV vancomycin/and porifabutin, per ID recommendations. Scheduled end date March 26.   2. Right upper extremity superficial and deep vein thrombosis/ cephalic and brachial veins.Left superficial vein thrombosis, basilic and cephalic vein.No significant pain onupper extremities. Continue with rivaroxaban for anticoagulation.   3. Depression and anxiety/ tobacco abuse.On adderall, aripiprazole, trazodone and bupropion, plusPRN diazepam with good toleration.    4. Chronic multifactorial anemia.Hgb stable at 9,0 with Hct at 27.9   DVT prophylaxis:rivaroxaban Code Status:full Family Communication:no family at the bedside Disposition  Plan/ discharge barriers:patient will need to complete antibiotic therapy IV until March 26   Nutrition Status: Nutrition Problem: Increased nutrient needs Etiology: post-op healing Signs/Symptoms: estimated needs Interventions: Ensure Enlive (each supplement provides  350kcal and 20 grams of protein), MVI     Skin Documentation:     Consultants:   Neurosurgery   ID   Procedures:   Cervical spine fusion C4 and C7 09/29/19.   Antimicrobials:   Vancomycin and rifabutin.     Subjective: Patient continue to have neck pain and decreased mobility, partially controlled with analgesics, worse with movement, no nausea or vomiting associated and no radiation.   Objective: Vitals:   11/15/19 1512 11/15/19 2027 11/16/19 0316 11/16/19 0752  BP: 107/80 115/85 97/64 98/66   Pulse: 98 (!) 110 97 98  Resp: 18 18 18 17   Temp: 98.7 F (37.1 C) 98.5 F (36.9 C) 98.3 F (36.8 C) 99.8 F (37.7 C)  TempSrc: Oral Oral Oral Oral  SpO2: 100% 99% 100% 97%  Weight:      Height:        Intake/Output Summary (Last 24 hours) at 11/16/2019 3500 Last data filed at 11/15/2019 1900 Gross per 24 hour  Intake 840 ml  Output -  Net 840 ml   Filed Weights   09/29/19 2032  Weight: 49.9 kg    Examination:   General: Not in pain or dyspnea, deconditioned  Neurology: Awake and alert, non focal  E ENT: mild pallor, no icterus, oral mucosa moist Cardiovascular: No JVD. S1-S2 present, rhythmic, no gallops, rubs, or murmurs. No lower extremity edema. Pulmonary: positive breath sounds bilaterally, adequate air movement, no wheezing, rhonchi or rales. Gastrointestinal. Abdomen with no organomegaly, non tender, no rebound or guarding Skin. No rashes Musculoskeletal: no joint deformities     Data Reviewed: I have personally reviewed following labs and imaging studies  CBC: Recent Labs  Lab 11/10/19 0321 11/11/19 1226 11/12/19 0419 11/15/19 0431 11/16/19 0456  WBC 6.8 7.5 5.6 6.6 5.5   NEUTROABS  --   --   --  4.4 3.6  HGB 9.8* 10.6* 8.8* 9.0* 9.0*  HCT 30.9* 34.2* 28.4* 28.7* 27.9*  MCV 88.3 88.1 89.6 87.5 85.6  PLT 494* 551* 440* 416* 938   Basic Metabolic Panel: Recent Labs  Lab 11/14/19 0353 11/15/19 0431 11/16/19 0456  NA 135 137 137  K 3.8 3.8 4.0  CL 102 103 102  CO2 25 26 27   GLUCOSE 152* 99 101*  BUN 9 8 6   CREATININE 0.51 0.54 0.55  CALCIUM 8.7* 8.7* 8.8*   GFR: Estimated Creatinine Clearance: 74.4 mL/min (by C-G formula based on SCr of 0.55 mg/dL). Liver Function Tests: No results for input(s): AST, ALT, ALKPHOS, BILITOT, PROT, ALBUMIN in the last 168 hours. No results for input(s): LIPASE, AMYLASE in the last 168 hours. No results for input(s): AMMONIA in the last 168 hours. Coagulation Profile: No results for input(s): INR, PROTIME in the last 168 hours. Cardiac Enzymes: No results for input(s): CKTOTAL, CKMB, CKMBINDEX, TROPONINI in the last 168 hours. BNP (last 3 results) No results for input(s): PROBNP in the last 8760 hours. HbA1C: No results for input(s): HGBA1C in the last 72 hours. CBG: No results for input(s): GLUCAP in the last 168 hours. Lipid Profile: No results for input(s): CHOL, HDL, LDLCALC, TRIG, CHOLHDL, LDLDIRECT in the last 72 hours. Thyroid Function Tests: No results for input(s): TSH, T4TOTAL, FREET4, T3FREE, THYROIDAB in the last 72 hours. Anemia Panel: No results for input(s): VITAMINB12, FOLATE, FERRITIN, TIBC, IRON, RETICCTPCT in the last 72 hours.    Radiology Studies: I have reviewed all of the imaging during this hospital visit personally     Scheduled Meds: . acetaminophen  1,000 mg Oral TID  . amphetamine-dextroamphetamine  30 mg Oral BID  . ARIPiprazole  20 mg Oral Daily  . buPROPion  150 mg Oral Daily  . Chlorhexidine Gluconate Cloth  6 each Topical Q0600  . diclofenac Sodium  4 g Topical QID  . feeding supplement (ENSURE ENLIVE)  237 mL Oral BID BM  . gabapentin  100 mg Oral TID  . influenza  vac split quadrivalent PF  0.5 mL Intramuscular Tomorrow-1000  . multivitamin with minerals  1 tablet Oral Daily  . nicotine  14 mg Transdermal Daily  . rifabutin  300 mg Oral Daily  . rivaroxaban  15 mg Oral BID WC   Followed by  . [START ON 11/24/2019] rivaroxaban  20 mg Oral Daily  . senna-docusate  1 tablet Oral BID  . sodium chloride flush  10-40 mL Intracatheter Q12H  . sodium chloride flush  3 mL Intravenous Q12H   Continuous Infusions: . sodium chloride Stopped (10/24/19 1012)  . sodium chloride 250 mL (11/13/19 2315)  . vancomycin 1,250 mg (11/16/19 0524)     LOS: 48 days        Boden Stucky Annett Gula, MD

## 2019-11-17 ENCOUNTER — Ambulatory Visit: Payer: Self-pay | Admitting: Family Medicine

## 2019-11-17 LAB — CBC WITH DIFFERENTIAL/PLATELET
Abs Immature Granulocytes: 0.02 10*3/uL (ref 0.00–0.07)
Basophils Absolute: 0 10*3/uL (ref 0.0–0.1)
Basophils Relative: 1 %
Eosinophils Absolute: 0.2 10*3/uL (ref 0.0–0.5)
Eosinophils Relative: 3 %
HCT: 26.1 % — ABNORMAL LOW (ref 36.0–46.0)
Hemoglobin: 8.2 g/dL — ABNORMAL LOW (ref 12.0–15.0)
Immature Granulocytes: 0 %
Lymphocytes Relative: 28 %
Lymphs Abs: 1.4 10*3/uL (ref 0.7–4.0)
MCH: 27.2 pg (ref 26.0–34.0)
MCHC: 31.4 g/dL (ref 30.0–36.0)
MCV: 86.4 fL (ref 80.0–100.0)
Monocytes Absolute: 0.5 10*3/uL (ref 0.1–1.0)
Monocytes Relative: 11 %
Neutro Abs: 2.8 10*3/uL (ref 1.7–7.7)
Neutrophils Relative %: 57 %
Platelets: 378 10*3/uL (ref 150–400)
RBC: 3.02 MIL/uL — ABNORMAL LOW (ref 3.87–5.11)
RDW: 13.4 % (ref 11.5–15.5)
WBC: 5 10*3/uL (ref 4.0–10.5)
nRBC: 0 % (ref 0.0–0.2)

## 2019-11-17 LAB — BASIC METABOLIC PANEL
Anion gap: 7 (ref 5–15)
BUN: 11 mg/dL (ref 6–20)
CO2: 27 mmol/L (ref 22–32)
Calcium: 8.6 mg/dL — ABNORMAL LOW (ref 8.9–10.3)
Chloride: 103 mmol/L (ref 98–111)
Creatinine, Ser: 0.48 mg/dL (ref 0.44–1.00)
GFR calc Af Amer: >60 mL/min (ref >60)
GFR calc non Af Amer: >60 mL/min (ref >60)
Glucose, Bld: 98 mg/dL (ref 70–99)
Potassium: 4.1 mmol/L (ref 3.5–5.1)
Sodium: 137 mmol/L (ref 135–145)

## 2019-11-17 LAB — C-REACTIVE PROTEIN: CRP: 8 mg/dL — ABNORMAL HIGH (ref <1.0)

## 2019-11-17 LAB — SEDIMENTATION RATE: Sed Rate: 80 mm/hr — ABNORMAL HIGH (ref 0–22)

## 2019-11-17 NOTE — Progress Notes (Signed)
Occupational Therapy Treatment Patient Details Name: Colleen Ramirez MRN: [id number] DOB: 01/29/1981 Today's Date: 11/17/2019    History of present illness Colleen Ramirez is a 39 y.o. F admitted to the ED with c/o severe neck pain secondary to a cervical epidural abscess. She is s/p C5-6 anterior cervical discectomy and instrumented fusion. PMH: IVDU, anemia, anxiety, depression, recent admission for facial cellulitis.    OT comments  Pt in recliner upon arrival, agreeable to therapy. Pt continues to admit that she has not been consistent with UE HEP with tband but states that she performs [name] exercises with theraputty every other day and folds up her clothes, continued to educate pt on importance of compliance with HEP. Pt completed bicep curl with theraband (Level 1, wrist flexion/extension x10 bilateral wrists, scapular retraction. Pt reports that he shoulder flexion has improved to don shirts with less difficulty and to put her hair up. Pt educated on compensatory techniques to don short and long sleeve shirts and is able to return demo with increased time and effort. OT will continue to follow acutely  Follow Up Recommendations  No OT follow up    Equipment Recommendations  None recommended by OT    Recommendations for Other Services      Precautions / Restrictions Precautions Precautions: Cervical Precaution Comments: reviewed cervical precautions with pt, pt with brace off upon arrival Cervical Brace: Soft collar;For comfort Restrictions Weight Bearing Restrictions: No Other Position/Activity Restrictions: pt with brace off upon arrival       Mobility Bed Mobility               General bed mobility comments: pt in recliener upon arrival  Transfers Overall transfer level: Independent Equipment used: None Transfers: Sit to/from Stand Sit to Stand: Independent              Balance Overall balance assessment: Independent                                         ADL either performed or assessed with clinical judgement   ADL Overall ADL's : Modified independent                                       General ADL Comments: pt able to don shirts and put her hair up, however reminded her to not raise shoulders past 90 degrees for any other activites than simple selfcare     Vision Baseline Vision/History: Wears glasses Wears Glasses: Reading only Patient Visual Report: No change from baseline     Perception     Praxis      Cognition Arousal/Alertness: Awake/alert Behavior During Therapy: WFL for tasks assessed/performed                                   General Comments: pt pleasant and conversant, good safety awareness        Exercises Other Exercises Other Exercises: Pt continues to admit that she has not been consistent with UE HEP with tband but states that she performs [name] exercises with theraputty every other day and folds up her clothes, continued to educate pt on importance of compliance with HEP Other Exercises: Pt completed bicep curl with theraband (Level 1) Other Exercises: Wrist flexion/extension  x10 bilateral wrists Other Exercises: scapular retraction. Other Exercises: theraputty, level 1 theraband   Shoulder Instructions       General Comments      Pertinent Vitals/ Pain       Pain Assessment: 0-10 Pain Score: 4  Pain Location: right shoulder/arm and neck Pain Intervention(s): Limited activity within patient's tolerance;Monitored during session  Home Living                                          Prior Functioning/Environment              Frequency  Min 1X/week        Progress Toward Goals  OT Goals(current goals can now be found in the care plan section)  Progress towards OT goals: Progressing toward goals  Acute Rehab OT Goals Patient Stated Goal: decrease pain  Plan Frequency remains appropriate    Co-evaluation                  AM-PAC OT "6 Clicks" Daily Activity     Outcome Measure   Help from another person eating meals?: None Help from another person taking care of personal grooming?: None Help from another person toileting, which includes using toliet, bedpan, or urinal?: None Help from another person bathing (including washing, rinsing, drying)?: None Help from another person to put on and taking off regular upper body clothing?: None Help from another person to put on and taking off regular lower body clothing?: None 6 Click Score: 24    End of Session    OT Visit Diagnosis: Muscle weakness (generalized) (M62.81);Pain Pain - Right/Left: Right Pain - part of body: Shoulder;Arm   Activity Tolerance Patient tolerated treatment well   Patient Left in chair   Nurse Communication          Time: 1115-1140 OT Time Calculation (min): 25 min  Charges: OT General Charges $OT Visit: 1 Visit OT Treatments $Self Care/Home Management : 8-22 mins $Therapeutic Exercise: 8-22 mins     Britt Bottom 11/17/2019, 12:50 PM

## 2019-11-17 NOTE — Progress Notes (Signed)
PROGRESS NOTE    Wynetta Seith  ZOX:[id number] DOB: [birth year] DOA: 09/29/2019 PCP: Patient, No Pcp Per    Brief Narrative:  Patient was admitted to the hospital with working diagnosis of sepsis secondary to cervical epiduralMRSAabscesswith discitis vertebral osteomyelitis.Complicated by right arm deep vein thrombosis.Now sp discectomy and fusion of the cervical spine. To complete antibiotic therapy March 23.   39 year old female with significant past medical history for IV drug abuse, anxiety, depression, anemia and status post gastric bypass. She reported 2-day history of severe sharp neck pain, worse with movement. Recent hospitalization 08/18/19 to12/8/24 facial cellulitis.On her initial physical examination blood pressure 114/85, heart rate 105, respiratory rate 16, temperature 99.8, oxygen saturation 97%, her lungs were clear to auscultation bilaterally, heart S1-S2 present rhythmic, abdomen soft, no lower extremity edema, neurologically nonfocal.  MRI cervical spine with discitis osteomyelitis at C5-6 and C6-7. Pronounced prevertebral/retropharyngeal soft tissue phlegmonous inflammation with areas of nonenhancing material consistent with pus. Epidural phlegmonusinflammation from C3-T1 with a few small areas of nonenhancing material particularly at the C5-6 and C6-7 level consistent with early epidural abscess.  01/12,patient underwent cervical 5 through cervical 6 anterior discectomy and instrumented fusion.Cultures were positive for MRSA, patient treated with vancomycin and rifampin.  02/14Follow-up cervical MRI with progression of findings consistent with discitis/osteomyelitis involving C4, C5, C6 and C7 vertebrae. Persistent ventral greater than dorsal epidural phlegmon spanning the C2-C7 levels, persistent vertebral, retropharyngeal phlegmon.  Neurosurgery and ID recommendations to follow-up with aggressive medical therapy,IV antibiotics until March 26.  I  talked to her about buprenorphine, that can be an option for discharge, patient will need pain clinic for follow up.Patient prefers to have better pain control before starting buprenorphine  Patient has a central line on the right IJ for antibiotic therapy, due to bilateral upper extremities venous thrombosis.  Patient with intermittent fever, worsening crp and sed rate, along with reported worsening neck pain, she underwent a follow cervical spine MRI on 11/16/19, reported no significant changes in cervical spine and no signs of progression of canal narrowing or cord compression.   Assessment & Plan:   Principal Problem:   Abscess in epidural space of cervical spine Active Problems:   Depression   Acquired fusion of cervical spine   IVDU (intravenous drug user)   Homeless   S/P cervical discectomy   Anxiety   Status post gastric bypass for obesity   Hepatitis C antibody positive in blood   Acute bilateral thoracic back pain   Body aches   Methamphetamine abuse (HCC)   Pain of right sternoclavicular joint    1. Cervical spine epidural abscess with vertebral osteomyelitis/ MRSA. C4 to C7/ sp fusion. IV drug abuse/ hepatitis C. Follow up cervical spine MRI with no signs of worsening infection, canal narrowing or cord compression. Follow blood cultures continue with no growth, wbc stable at 5,0, with CRP down to 8,0 and Sed Rate up to 80.  Continue pain control with q 3 H hydromorphone as needed  plus scheduled acetaminophen and gabapentin100 mg tid. Continue to holding non steroidal antiinflammatory agents due to hx of peptic ulcer disease in the past.   Continue antibiotic therapy with IV vancomycin/and porifabutin,per ID recommendations. Her scheduledend date March 26. Will continue to follow inflammatory markers, cell count and temperature curve.   2. Right upper extremity superficial and deep vein thrombosis/ cephalic and brachial veins.Left superficial vein thrombosis,  basilic and cephalic vein.No significant pain onupper extremities. On rivaroxaban for anticoagulation with good toleration.   3. Depression  and anxiety/ tobacco abuse.Continue with adderall, aripiprazole, trazodone and bupropion. HasPRN diazepam for anxiety.  4. Chronic multifactorial anemia.Hgb stable continue to be stable with Hgb at 8.2 and hct at 27,2/    DVT prophylaxis:rivaroxaban Code Status:full Family Communication:no family at the bedside Disposition Plan/ discharge barriers:patient from home, barriers for discharge need for continue inpatient IV antibiotic therapy, patient will need to complete antibiotic therapy IV until March 26  Nutrition Status: Nutrition Problem: Increased nutrient needs Etiology: post-op healing Signs/Symptoms: estimated needs Interventions: Ensure Enlive (each supplement provides 350kcal and 20 grams of protein), MVI    Consultants:   Neurosurgery   ID   Procedures:   Cervical spine fusion C4 and C7 09/29/19.   Antimicrobials:   Vancomycin and rifabutin.     Subjective: Her neck pain is stable, no nausea or vomiting, no chest pain, or dyspnea, she has been out of bed to chair. Continue to have limited mobility of her neck.   Objective: Vitals:   11/16/19 1900 11/16/19 2008 11/17/19 0500 11/17/19 0700  BP: 96/76 96/76 100/63 117/66  Pulse: 98 100 99 98  Resp: 18 18 18 17   Temp: 97.8 F (36.6 C) 97.8 F (36.6 C) 98 F (36.7 C) 98 F (36.7 C)  TempSrc: Oral Oral Oral Oral  SpO2: 100% 100%  100%  Weight:      Height:        Intake/Output Summary (Last 24 hours) at 11/17/2019 1121 Last data filed at 11/17/2019 0700 Gross per 24 hour  Intake 1830 ml  Output --  Net 1830 ml   Filed Weights   09/29/19 2032  Weight: 49.9 kg    Examination:   General: Not in pain or dyspnea.  Neurology: Awake and alert, non focal  E ENT: no pallor, no icterus, oral mucosa moist Cardiovascular: No JVD. S1-S2 present,  rhythmic, no gallops, rubs, or murmurs. No lower extremity edema. Pulmonary: positive breath sounds bilaterally, adequate air movement, no wheezing, rhonchi or rales. Gastrointestinal. Abdomen with no organomegaly, non tender, no rebound or guarding Skin. No rashes Musculoskeletal: no joint deformities     Data Reviewed: I have personally reviewed following labs and imaging studies  CBC: Recent Labs  Lab 11/11/19 1226 11/12/19 0419 11/15/19 0431 11/16/19 0456 11/17/19 0410  WBC 7.5 5.6 6.6 5.5 5.0  NEUTROABS  --   --  4.4 3.6 2.8  HGB 10.6* 8.8* 9.0* 9.0* 8.2*  HCT 34.2* 28.4* 28.7* 27.9* 26.1*  MCV 88.1 89.6 87.5 85.6 86.4  PLT 551* 440* 416* 399 542   Basic Metabolic Panel: Recent Labs  Lab 11/14/19 0353 11/15/19 0431 11/16/19 0456 11/17/19 0410  NA 135 137 137 137  K 3.8 3.8 4.0 4.1  CL 102 103 102 103  CO2 25 26 27 27   GLUCOSE 152* 99 101* 98  BUN 9 8 6 11   CREATININE 0.51 0.54 0.55 0.48  CALCIUM 8.7* 8.7* 8.8* 8.6*   GFR: Estimated Creatinine Clearance: 74.4 mL/min (by C-G formula based on SCr of 0.48 mg/dL). Liver Function Tests: No results for input(s): AST, ALT, ALKPHOS, BILITOT, PROT, ALBUMIN in the last 168 hours. No results for input(s): LIPASE, AMYLASE in the last 168 hours. No results for input(s): AMMONIA in the last 168 hours. Coagulation Profile: No results for input(s): INR, PROTIME in the last 168 hours. Cardiac Enzymes: No results for input(s): CKTOTAL, CKMB, CKMBINDEX, TROPONINI in the last 168 hours. BNP (last 3 results) No results for input(s): PROBNP in the last 8760 hours. HbA1C: No  results for input(s): HGBA1C in the last 72 hours. CBG: No results for input(s): GLUCAP in the last 168 hours. Lipid Profile: No results for input(s): CHOL, HDL, LDLCALC, TRIG, CHOLHDL, LDLDIRECT in the last 72 hours. Thyroid Function Tests: No results for input(s): TSH, T4TOTAL, FREET4, T3FREE, THYROIDAB in the last 72 hours. Anemia Panel: No results  for input(s): VITAMINB12, FOLATE, FERRITIN, TIBC, IRON, RETICCTPCT in the last 72 hours.    Radiology Studies: I have reviewed all of the imaging during this hospital visit personally     Scheduled Meds: . acetaminophen  1,000 mg Oral TID  . amphetamine-dextroamphetamine  30 mg Oral BID  . ARIPiprazole  20 mg Oral Daily  . buPROPion  150 mg Oral Daily  . Chlorhexidine Gluconate Cloth  6 each Topical Q0600  . diclofenac Sodium  4 g Topical QID  . feeding supplement (ENSURE ENLIVE)  237 mL Oral BID BM  . gabapentin  100 mg Oral TID  . influenza vac split quadrivalent PF  0.5 mL Intramuscular Tomorrow-1000  . multivitamin with minerals  1 tablet Oral Daily  . nicotine  14 mg Transdermal Daily  . rifabutin  300 mg Oral Daily  . rivaroxaban  15 mg Oral BID WC   Followed by  . [START ON 11/24/2019] rivaroxaban  20 mg Oral Daily  . senna-docusate  1 tablet Oral BID  . sodium chloride flush  10-40 mL Intracatheter Q12H  . sodium chloride flush  3 mL Intravenous Q12H   Continuous Infusions: . sodium chloride Stopped (10/24/19 1012)  . sodium chloride 250 mL (11/13/19 2315)  . vancomycin 1,250 mg (11/17/19 0537)     LOS: 49 days        Katherine Syme Annett Gula, MD

## 2019-11-17 NOTE — Plan of Care (Signed)
  Problem: Clinical Measurements: Goal: Respiratory complications will improve Outcome: Progressing   Problem: Activity: Goal: Risk for activity intolerance will decrease Outcome: Progressing Note: Up independently in room/halls tolerating well    Problem: Nutrition: Goal: Adequate nutrition will be maintained Outcome: Progressing   Problem: Coping: Goal: Level of anxiety will decrease Outcome: Progressing   Problem: Elimination: Goal: Will not experience complications related to urinary retention Outcome: Progressing   Problem: Pain Managment: Goal: General experience of comfort will improve Outcome: Progressing Note: Treated q3 hrs with dilaudid for chronic neck pain   Problem: Safety: Goal: Ability to remain free from injury will improve Outcome: Progressing

## 2019-11-18 ENCOUNTER — Ambulatory Visit: Payer: Self-pay | Admitting: Family

## 2019-11-18 MED ORDER — FERROUS SULFATE 325 (65 FE) MG PO TABS
325.0000 mg | ORAL_TABLET | Freq: Every day | ORAL | Status: DC
  Administered 2019-11-19 – 2019-12-11 (×23): 325 mg via ORAL
  Filled 2019-11-18 (×25): qty 1

## 2019-11-18 MED ORDER — FERROUS SULFATE 325 (65 FE) MG PO TABS: 325.0000 mg | ORAL_TABLET | Freq: Two times a day (BID) | ORAL | Status: DC

## 2019-11-18 NOTE — Progress Notes (Signed)
PROGRESS NOTE  Colleen Ramirez  DOB: [birth year]  PCP: Patient, No Pcp Per QIH:[id number]  DOA: 09/29/2019 Admitted From: Home  LOS: 50 days   Chief Complaint  Patient presents with  . Generalized Body Aches   Brief narrative: Patient was admitted to the hospital with working diagnosis of sepsis secondary to cervical epiduralMRSAabscesswith discitis vertebral osteomyelitis.Complicated by right arm deep vein thrombosis.Now sp discectomy and fusion of the cervical spine. To complete antibiotic therapy March 80.   39 year old female with significant past medical history for IV drug abuse, anxiety, depression, anemia and status post gastric bypass.  Patient presented to the ED on 1/12 with complaint of 2-day history of severe sharp neck pain, worse with movement.  Recent hospitalization 08/18/19 to12/8/24 for facial cellulitis.  MRI cervical spine with discitis osteomyelitis at C5-6 and C6-7. Pronounced prevertebral/retropharyngeal soft tissue phlegmonous inflammation with areas of nonenhancing material consistent with pus. Epidural phlegmonusinflammation from C3-T1 with a few small areas of nonenhancing material particularly at the C5-6 and C6-7 level consistent with early epidural abscess.  01/12,patient underwent cervical 5 through cervical 6 anterior discectomy and instrumented fusion.Cultures were positive for MRSA, patient treated with vancomycin and rifampin.  02/14Follow-up cervical MRI with progression of findings consistent with discitis/osteomyelitis involving C4, C5, C6 and C7 vertebrae. Persistent ventral greater than dorsal epidural phlegmon spanning the C2-C7 levels, persistent vertebral, retropharyngeal phlegmon.  Neurosurgery and ID recommendations to follow-up with aggressive medical therapy,IV antibiotics (IV vancomycin/and porifabutin) until March 26. Patient has a central line on the right IJ for antibiotic therapy, due to bilateral upper extremities  venous thrombosis.  3/1, follow-up cervical spine MRI was obtained because of intermittent fever, worsening neck pain, worsening CRP and sed rate.  MRI reported no significant change in cervical spine and no signs of progression of canal narrowing or cord compression.   Subjective: Patient was seen and examined this morning.  Pleasant young Caucasian female.  Lying down in bed.  Worried about the findings of persistent osteomyelitis in MRI.  Worried about her long-term risk of recurrence.  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  sp C4 to C7/ sp fusion -See above for the details of imaging findings and surgery.  History of IV drug abuse/ hepatitis C. -Currently pain controlled with q 3 H hydromorphone as needed  plus scheduledacetaminophen and gabapentin100 mg tid. -Avoid NSAID because of history of peptic ulcer disease. -Will continue to follow inflammatory markers, cell count and temperature curve.  -patient will need pain clinic for follow up.Patient prefers to have better pain control before starting buprenorphine  Bilateral upper extremity DVT  -right upper extremity superficial and deep vein thrombosis/ cephalic and brachial veins.Left superficial vein thrombosis, basilic and cephalic vein. -No significant pain onupper extremities. -Onrivaroxaban for anticoagulation with good toleration.   Depression and anxiety/ tobacco abuse. -Continue with adderall, aripiprazole, trazodone and bupropion. HasPRN diazepam for anxiety.  Chronic multifactorial anemia. -Hgb stable continue to be stable with Hgb at 8.2. -Obtain iron profile  DVT prophylaxis:  Xarelto Antimicrobials:  IV vancomycin and oral rifabutin Fluid: None Diet: General  Code Status:  Full code Mobility: Encourage ambulation Family Communication:  None at bedside Discharge plan:  Anticipated date: Due to finish IV antibiotics in March  26  Antimicrobials: Anti-infectives (From admission, onward)   Start     Dose/Rate Route Frequency Ordered Stop   11/10/19 1200  vancomycin (VANCOREADY) IVPB 1250 mg/250 mL     1,250 mg 166.7 mL/hr over 90 Minutes Intravenous Every 12  hours 11/10/19 0627     11/09/19 2200  rifabutin (MYCOBUTIN) capsule 300 mg  Status:  Discontinued     300 mg Oral Every 12 hours 11/09/19 1136 11/09/19 1646   11/09/19 2200  rifabutin (MYCOBUTIN) capsule 300 mg     300 mg Oral Daily 11/09/19 1646     11/05/19 1600  vancomycin (VANCOCIN) IVPB 1000 mg/200 mL premix  Status:  Discontinued     1,000 mg 200 mL/hr over 60 Minutes Intravenous Every 12 hours 11/05/19 1504 11/10/19 0627   11/01/19 1400  vancomycin (VANCOREADY) IVPB 1500 mg/300 mL  Status:  Discontinued     1,500 mg 150 mL/hr over 120 Minutes Intravenous Every 12 hours 11/01/19 1305 11/05/19 0823   10/28/19 1000  vancomycin (VANCOREADY) IVPB 1500 mg/300 mL  Status:  Discontinued     1,500 mg 150 mL/hr over 120 Minutes Intravenous Every 12 hours 10/27/19 2353 11/01/19 1305   10/14/19 1000  vancomycin (VANCOREADY) IVPB 1250 mg/250 mL  Status:  Discontinued     1,250 mg 166.7 mL/hr over 90 Minutes Intravenous Every 12 hours 10/14/19 0540 10/27/19 2353   10/13/19 0945  Vancomycin (VANCOCIN) 1,250 mg in sodium chloride 0.9 % 250 mL IVPB  Status:  Discontinued     1,250 mg 166.7 mL/hr over 90 Minutes Intravenous Every 12 hours 10/13/19 0935 10/14/19 0540   10/13/19 0930  Vancomycin (VANCOCIN) 1,250 mg in sodium chloride 0.9 % 250 mL IVPB  Status:  Discontinued     1,250 mg 166.7 mL/hr over 90 Minutes Intravenous Every 12 hours 10/13/19 0902 10/13/19 0934   10/09/19 2000  vancomycin (VANCOCIN) IVPB 1000 mg/200 mL premix  Status:  Discontinued     1,000 mg 200 mL/hr over 60 Minutes Intravenous Every 12 hours 10/09/19 1121 10/13/19 0902   10/04/19 2200  vancomycin (VANCOCIN) IVPB 750 mg/150 ml premix  Status:  Discontinued     750 mg 150 mL/hr over 60  Minutes Intravenous Every 8 hours 10/04/19 1305 10/09/19 1121   10/04/19 1400  vancomycin (VANCOCIN) IVPB 1000 mg/200 mL premix     1,000 mg 200 mL/hr over 60 Minutes Intravenous STAT 10/04/19 1305 10/04/19 1604   10/01/19 1100  rifampin (RIFADIN) capsule 300 mg  Status:  Discontinued     300 mg Oral Every 12 hours 10/01/19 1056 11/09/19 1136   09/30/19 1800  Vancomycin (VANCOCIN) 1,250 mg in sodium chloride 0.9 % 250 mL IVPB  Status:  Discontinued     1,250 mg 166.7 mL/hr over 90 Minutes Intravenous Every 24 hours 09/30/19 0003 09/30/19 0747   09/30/19 1800  vancomycin (VANCOREADY) IVPB 1250 mg/250 mL  Status:  Discontinued     1,250 mg 166.7 mL/hr over 90 Minutes Intravenous Every 24 hours 09/30/19 0746 10/04/19 1305   09/30/19 0400  piperacillin-tazobactam (ZOSYN) IVPB 3.375 g  Status:  Discontinued     3.375 g 12.5 mL/hr over 240 Minutes Intravenous Every 8 hours 09/30/19 0003 09/30/19 1533   09/29/19 2145  piperacillin-tazobactam (ZOSYN) IVPB 3.375 g     3.375 g 12.5 mL/hr over 240 Minutes Intravenous STAT 09/29/19 2137 09/29/19 2148   09/29/19 2119  bacitracin 50,000 Units in sodium chloride 0.9 % 500 mL irrigation  Status:  Discontinued       As needed 09/29/19 2119 09/29/19 2249   09/29/19 2033  vancomycin (VANCOCIN) 1-5 GM/200ML-% IVPB    Note to Pharmacy: Ubaldo Glassing   : cabinet override      09/29/19 2033 09/30/19 8469  Code Status: Full Code   Diet Order            Diet regular Room service appropriate? Yes; Fluid consistency: Thin  Diet effective now              Infusions:  . sodium chloride 250 mL (11/17/19 1800)  . sodium chloride 250 mL (11/13/19 2315)  . vancomycin Stopped (11/18/19 1353)    Scheduled Meds: . acetaminophen  1,000 mg Oral TID  . amphetamine-dextroamphetamine  30 mg Oral BID  . ARIPiprazole  20 mg Oral Daily  . buPROPion  150 mg Oral Daily  . Chlorhexidine Gluconate Cloth  6 each Topical Q0600  . diclofenac Sodium  4 g  Topical QID  . feeding supplement (ENSURE ENLIVE)  237 mL Oral BID BM  . [START ON 11/19/2019] ferrous sulfate  325 mg Oral Q breakfast  . gabapentin  100 mg Oral TID  . influenza vac split quadrivalent PF  0.5 mL Intramuscular Tomorrow-1000  . multivitamin with minerals  1 tablet Oral Daily  . nicotine  14 mg Transdermal Daily  . rifabutin  300 mg Oral Daily  . rivaroxaban  15 mg Oral BID WC   Followed by  . [START ON 11/24/2019] rivaroxaban  20 mg Oral Daily  . senna-docusate  1 tablet Oral BID  . sodium chloride flush  10-40 mL Intracatheter Q12H  . sodium chloride flush  3 mL Intravenous Q12H    PRN meds: sodium chloride, cyclobenzaprine, diazepam, HYDROmorphone, lidocaine (PF), ondansetron **OR** ondansetron (ZOFRAN) IV, polyethylene glycol, senna-docusate, sodium chloride, sodium chloride flush, sodium chloride flush, traZODone   Objective: Vitals:   11/18/19 0700 11/18/19 1500  BP: 103/68 115/74  Pulse: (!) 104 99  Resp: 18 18  Temp: 98.5 F (36.9 C) 98.8 F (37.1 C)  SpO2: 100% 100%    Intake/Output Summary (Last 24 hours) at 11/18/2019 1626 Last data filed at 11/17/2019 2200 Gross per 24 hour  Intake 490 ml  Output --  Net 490 ml   Filed Weights   09/29/19 2032  Weight: 49.9 kg   Weight change:  Body mass index is 19.49 kg/m.   Physical Exam: General exam: Not in physical distress Skin: No rashes, lesions or ulcers. HEENT: Atraumatic, normocephalic, supple neck, no obvious bleeding Lungs: Clear to auscultation bilaterally CVS: Regular rate and rhythm, no murmur GI/Abd soft, nontender, nondistended, bowel sound present CNS: Alert, awake, oriented x3 Psychiatry: Seems anxious today Extremities: No pedal edema, no calf tenderness  Data Review: I have personally reviewed the laboratory data and studies available.  Recent Labs  Lab 11/12/19 0419 11/15/19 0431 11/16/19 0456 11/17/19 0410  WBC 5.6 6.6 5.5 5.0  NEUTROABS  --  4.4 3.6 2.8  HGB 8.8* 9.0* 9.0*  8.2*  HCT 28.4* 28.7* 27.9* 26.1*  MCV 89.6 87.5 85.6 86.4  PLT 440* 416* 399 378   Recent Labs  Lab 11/14/19 0353 11/15/19 0431 11/16/19 0456 11/17/19 0410  NA 135 137 137 137  K 3.8 3.8 4.0 4.1  CL 102 103 102 103  CO2 25 26 27 27   GLUCOSE 152* 99 101* 98  BUN 9 8 6 11   CREATININE 0.51 0.54 0.55 0.48  CALCIUM 8.7* 8.7* 8.8* 8.6*    Signed, , MD [facility] Pager: [phone] (Secure Chat preferred). 11/18/2019

## 2019-11-18 NOTE — Plan of Care (Signed)
  Problem: Clinical Measurements: Goal: Will remain free from infection Outcome: Progressing   Problem: Activity: Goal: Risk for activity intolerance will decrease Outcome: Progressing   Problem: Elimination: Goal: Will not experience complications related to bowel motility Outcome: Progressing Goal: Will not experience complications related to urinary retention Outcome: Progressing   Problem: Pain Managment: Goal: General experience of comfort will improve Outcome: Progressing   

## 2019-11-18 NOTE — Plan of Care (Addendum)
  Problem: Skin Integrity: Goal: Risk for impaired skin integrity will decrease Outcome: Progressing   Problem: Safety: Goal: Ability to remain free from injury will improve Outcome: Progressing   Problem: Pain Managment: Goal: General experience of comfort will improve Outcome: Progressing  Noted: current regimen managing current pain  Problem: Coping: Goal: Level of anxiety will decrease Outcome: Progressing   Problem: Clinical Measurements: Goal: Will remain free from infection Outcome: Progressing

## 2019-11-18 NOTE — Plan of Care (Signed)
  Problem: Clinical Measurements: Goal: Will remain free from infection Outcome: Progressing Goal: Diagnostic test results will improve Outcome: Progressing   Problem: Activity: Goal: Risk for activity intolerance will decrease Outcome: Progressing   Problem: Nutrition: Goal: Adequate nutrition will be maintained Outcome: Progressing   Problem: Coping: Goal: Level of anxiety will decrease Outcome: Progressing   Problem: Elimination: Goal: Will not experience complications related to bowel motility Outcome: Progressing   Problem: Pain Managment: Goal: General experience of comfort will improve Outcome: Progressing   Problem: Skin Integrity: Goal: Risk for impaired skin integrity will decrease Outcome: Progressing   

## 2019-11-18 NOTE — Progress Notes (Signed)
Transitions of Care Pharmacist Note  Colleen Ramirez is a 39 y.o. female that has been diagnosed with DVT and will be prescribed Xarelto (rivaroxaban) at discharge. Case management consult for cost is pending.  Patient Education: I provided the following education on Xarelto 3/3 to the patient: How to take the medication Described what the medication is Signs of bleeding Signs/symptoms of VTE and stroke  Answered their questions  Discharge Medications Plan: The patient wants to have their discharge medications filled by the Transitions of Care pharmacy rather than their usual pharmacy.  The discharge orders pharmacy has been changed to the Transitions of Care pharmacy, the patient will receive a phone call regarding co-pay, and their medications will be delivered by the Trans[facility] pharmacy.    Thank you,   Alvia Grove, PharmD PGY1 Acute Care Pharmacy Resident November 18, 2019

## 2019-11-19 LAB — CBC WITH DIFFERENTIAL/PLATELET
Abs Immature Granulocytes: 0.03 10*3/uL (ref 0.00–0.07)
Basophils Absolute: 0.1 10*3/uL (ref 0.0–0.1)
Basophils Relative: 1 %
Eosinophils Absolute: 0.2 10*3/uL (ref 0.0–0.5)
Eosinophils Relative: 3 %
HCT: 27.7 % — ABNORMAL LOW (ref 36.0–46.0)
Hemoglobin: 8.6 g/dL — ABNORMAL LOW (ref 12.0–15.0)
Immature Granulocytes: 0 %
Lymphocytes Relative: 17 %
Lymphs Abs: 1.2 10*3/uL (ref 0.7–4.0)
MCH: 26.8 pg (ref 26.0–34.0)
MCHC: 31 g/dL (ref 30.0–36.0)
MCV: 86.3 fL (ref 80.0–100.0)
Monocytes Absolute: 0.5 10*3/uL (ref 0.1–1.0)
Monocytes Relative: 7 %
Neutro Abs: 5 10*3/uL (ref 1.7–7.7)
Neutrophils Relative %: 72 %
Platelets: 414 10*3/uL — ABNORMAL HIGH (ref 150–400)
RBC: 3.21 MIL/uL — ABNORMAL LOW (ref 3.87–5.11)
RDW: 13.4 % (ref 11.5–15.5)
WBC: 7 10*3/uL (ref 4.0–10.5)
nRBC: 0 % (ref 0.0–0.2)

## 2019-11-19 LAB — CULTURE, BLOOD (ROUTINE X 2)
Culture: NO GROWTH
Culture: NO GROWTH

## 2019-11-19 LAB — FERRITIN: Ferritin: 14 ng/mL (ref 11–307)

## 2019-11-19 LAB — IRON AND TIBC
Iron: 18 ug/dL — ABNORMAL LOW (ref 28–170)
Saturation Ratios: 4 % — ABNORMAL LOW (ref 10.4–31.8)
TIBC: 431 ug/dL (ref 250–450)
UIBC: 413 ug/dL

## 2019-11-19 NOTE — Progress Notes (Signed)
PROGRESS NOTE  Colleen Ramirez  DOB: Mar 23, 1981  PCP: Patient, No Pcp Per YBO:[id number]  DOA: 09/29/2019 Admitted From: Home  LOS: 51 days   Chief Complaint  Patient presents with  . Generalized Body Aches   Brief narrative: Patient was admitted to the hospital with working diagnosis of sepsis secondary to cervical epiduralMRSAabscesswith discitis vertebral osteomyelitis.Complicated by right arm deep vein thrombosis.Now sp discectomy and fusion of the cervical spine. To complete antibiotic therapy March 18.   39 year old female with significant past medical history for IV drug abuse, anxiety, depression, anemia and status post gastric bypass.  Patient presented to the ED on 1/12 with complaint of 2-day history of severe sharp neck pain, worse with movement.  Recent hospitalization 08/18/19 to12/8/24 for facial cellulitis.  MRI cervical spine with discitis osteomyelitis at C5-6 and C6-7. Pronounced prevertebral/retropharyngeal soft tissue phlegmonous inflammation with areas of nonenhancing material consistent with pus. Epidural phlegmonusinflammation from C3-T1 with a few small areas of nonenhancing material particularly at the C5-6 and C6-7 level consistent with early epidural abscess.  01/12,patient underwent cervical 5 through cervical 6 anterior discectomy and instrumented fusion.Cultures were positive for MRSA, patient treated with vancomycin and rifampin.  02/14Follow-up cervical MRI with progression of findings consistent with discitis/osteomyelitis involving C4, C5, C6 and C7 vertebrae. Persistent ventral greater than dorsal epidural phlegmon spanning the C2-C7 levels, persistent vertebral, retropharyngeal phlegmon.  Neurosurgery and ID recommendations to follow-up with aggressive medical therapy,IV antibiotics (IV vancomycin/and porifabutin) until March 26. Patient has a central line on the right IJ for antibiotic therapy, due to bilateral upper extremities  venous thrombosis.  3/1, follow-up cervical spine MRI was obtained because of intermittent fever, worsening neck pain, worsening CRP and sed rate. MRI reported no significant change in cervical spine and no signs of progression of canal narrowing or cord compression.    Subjective: Patient was seen and examined this morning.   Sitting up in chair.  No new symptoms.  No fever.  WBC count remains normal.  Hemoglobin slightly improved to 8.6.  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  sp C4 to C7/ sp fusion -See above for the details of imaging findings and surgery. -IV antibiotics to continue until March 26.  History of IV drug abuse/ hepatitis C. -Currently pain controlled with q 3 H hydromorphone as needed  plus scheduledacetaminophen and gabapentin100 mg tid. -Avoid NSAID because of history of peptic ulcer disease. -Will continue to follow inflammatory markers, cell count and temperature curve.  -patient will need pain clinic for follow up.Patient prefers to have better pain control before starting buprenorphine  Bilateral upper extremity DVT  -right upper extremity superficial and deep vein thrombosis/ cephalic and brachial veins.Left superficial vein thrombosis, basilic and cephalic vein. -No significant pain onupper extremities. -Onrivaroxaban for anticoagulation with good toleration.   Depression and anxiety/ tobacco abuse. -Continue with adderall, aripiprazole, trazodone and bupropion. HasPRN diazepam for anxiety.  Chronic multifactorial anemia. -Hgb stable continue to be stable with Hgb at 8.2. -3/4, iron studies showed low ferritin, low iron saturation, elevated TIBC, all suggestive of iron deficiency anemia. -I will start the patient on oral iron replacement.  Avoid IV iron in the setting of active infection.  DVT prophylaxis:  Xarelto Antimicrobials:  IV vancomycin and oral rifabutin Fluid: None Diet:  General  Code Status:  Full code Mobility: Encourage ambulation Family Communication:  None at bedside Discharge plan:  Anticipated date: Due to finish IV antibiotics in March 26  Antimicrobials: Anti-infectives (From admission, onward)  Start     Dose/Rate Route Frequency Ordered Stop   11/10/19 1200  vancomycin (VANCOREADY) IVPB 1250 mg/250 mL     1,250 mg 166.7 mL/hr over 90 Minutes Intravenous Every 12 hours 11/10/19 0627     11/09/19 2200  rifabutin (MYCOBUTIN) capsule 300 mg  Status:  Discontinued     300 mg Oral Every 12 hours 11/09/19 1136 11/09/19 1646   11/09/19 2200  rifabutin (MYCOBUTIN) capsule 300 mg     300 mg Oral Daily 11/09/19 1646     11/05/19 1600  vancomycin (VANCOCIN) IVPB 1000 mg/200 mL premix  Status:  Discontinued     1,000 mg 200 mL/hr over 60 Minutes Intravenous Every 12 hours 11/05/19 1504 11/10/19 0627   11/01/19 1400  vancomycin (VANCOREADY) IVPB 1500 mg/300 mL  Status:  Discontinued     1,500 mg 150 mL/hr over 120 Minutes Intravenous Every 12 hours 11/01/19 1305 11/05/19 0823   10/28/19 1000  vancomycin (VANCOREADY) IVPB 1500 mg/300 mL  Status:  Discontinued     1,500 mg 150 mL/hr over 120 Minutes Intravenous Every 12 hours 10/27/19 2353 11/01/19 1305   10/14/19 1000  vancomycin (VANCOREADY) IVPB 1250 mg/250 mL  Status:  Discontinued     1,250 mg 166.7 mL/hr over 90 Minutes Intravenous Every 12 hours 10/14/19 0540 10/27/19 2353   10/13/19 0945  Vancomycin (VANCOCIN) 1,250 mg in sodium chloride 0.9 % 250 mL IVPB  Status:  Discontinued     1,250 mg 166.7 mL/hr over 90 Minutes Intravenous Every 12 hours 10/13/19 0935 10/14/19 0540   10/13/19 0930  Vancomycin (VANCOCIN) 1,250 mg in sodium chloride 0.9 % 250 mL IVPB  Status:  Discontinued     1,250 mg 166.7 mL/hr over 90 Minutes Intravenous Every 12 hours 10/13/19 0902 10/13/19 0934   10/09/19 2000  vancomycin (VANCOCIN) IVPB 1000 mg/200 mL premix  Status:  Discontinued     1,000 mg 200 mL/hr over 60  Minutes Intravenous Every 12 hours 10/09/19 1121 10/13/19 0902   10/04/19 2200  vancomycin (VANCOCIN) IVPB 750 mg/150 ml premix  Status:  Discontinued     750 mg 150 mL/hr over 60 Minutes Intravenous Every 8 hours 10/04/19 1305 10/09/19 1121   10/04/19 1400  vancomycin (VANCOCIN) IVPB 1000 mg/200 mL premix     1,000 mg 200 mL/hr over 60 Minutes Intravenous STAT 10/04/19 1305 10/04/19 1604   10/01/19 1100  rifampin (RIFADIN) capsule 300 mg  Status:  Discontinued     300 mg Oral Every 12 hours 10/01/19 1056 11/09/19 1136   09/30/19 1800  Vancomycin (VANCOCIN) 1,250 mg in sodium chloride 0.9 % 250 mL IVPB  Status:  Discontinued     1,250 mg 166.7 mL/hr over 90 Minutes Intravenous Every 24 hours 09/30/19 0003 09/30/19 0747   09/30/19 1800  vancomycin (VANCOREADY) IVPB 1250 mg/250 mL  Status:  Discontinued     1,250 mg 166.7 mL/hr over 90 Minutes Intravenous Every 24 hours 09/30/19 0746 10/04/19 1305   09/30/19 0400  piperacillin-tazobactam (ZOSYN) IVPB 3.375 g  Status:  Discontinued     3.375 g 12.5 mL/hr over 240 Minutes Intravenous Every 8 hours 09/30/19 0003 09/30/19 1533   09/29/19 2145  piperacillin-tazobactam (ZOSYN) IVPB 3.375 g     3.375 g 12.5 mL/hr over 240 Minutes Intravenous STAT 09/29/19 2137 09/29/19 2148   09/29/19 2119  bacitracin 50,000 Units in sodium chloride 0.9 % 500 mL irrigation  Status:  Discontinued       As needed 09/29/19 2119 09/29/19  2249   09/29/19 2033  vancomycin (VANCOCIN) 1-5 GM/200ML-% IVPB    Note to Pharmacy: Toney Sang   : cabinet override      09/29/19 2033 09/30/19 0844        Code Status: Full Code   Diet Order            Diet regular Room service appropriate? Yes; Fluid consistency: Thin  Diet effective now              Infusions:  . sodium chloride 250 mL (11/17/19 1800)  . sodium chloride Stopped (11/18/19 1950)  . vancomycin 1,250 mg (11/19/19 0522)    Scheduled Meds: . acetaminophen  1,000 mg Oral TID  .  amphetamine-dextroamphetamine  30 mg Oral BID  . ARIPiprazole  20 mg Oral Daily  . buPROPion  150 mg Oral Daily  . Chlorhexidine Gluconate Cloth  6 each Topical Q0600  . diclofenac Sodium  4 g Topical QID  . feeding supplement (ENSURE ENLIVE)  237 mL Oral BID BM  . ferrous sulfate  325 mg Oral Q breakfast  . gabapentin  100 mg Oral TID  . influenza vac split quadrivalent PF  0.5 mL Intramuscular Tomorrow-1000  . multivitamin with minerals  1 tablet Oral Daily  . nicotine  14 mg Transdermal Daily  . rifabutin  300 mg Oral Daily  . rivaroxaban  15 mg Oral BID WC   Followed by  . [START ON 11/24/2019] rivaroxaban  20 mg Oral Daily  . senna-docusate  1 tablet Oral BID  . sodium chloride flush  10-40 mL Intracatheter Q12H  . sodium chloride flush  3 mL Intravenous Q12H    PRN meds: sodium chloride, cyclobenzaprine, diazepam, HYDROmorphone, lidocaine (PF), ondansetron **OR** ondansetron (ZOFRAN) IV, polyethylene glycol, senna-docusate, sodium chloride, sodium chloride flush, sodium chloride flush, traZODone   Objective: Vitals:   11/19/19 1144 11/19/19 1149  BP: 129/67 105/79  Pulse: 75 (!) 107  Resp: 17 17  Temp: 98.6 F (37 C) 98.7 F (37.1 C)  SpO2: 94% 100%   No intake or output data in the 24 hours ending 11/19/19 1501 Filed Weights   09/29/19 2032  Weight: 49.9 kg   Weight change:  Body mass index is 19.49 kg/m.   Physical Exam: General exam: Sitting up in chair.  Not distressed. Skin: No rashes, lesions or ulcers. HEENT: Atraumatic, normocephalic, supple neck, no obvious bleeding Lungs: Clear to auscultation bilaterally CVS: Regular rate and rhythm, no murmur GI/Abd soft, nontender, nondistended, bowel sound present CNS: Alert, awake, oriented x3 Psychiatry: Seems anxious today Extremities: No pedal edema, no calf tenderness  Data Review: I have personally reviewed the laboratory data and studies available.  Recent Labs  Lab 11/15/19 0431 11/16/19 0456  11/17/19 0410 11/19/19 0428  WBC 6.6 5.5 5.0 7.0  NEUTROABS 4.4 3.6 2.8 5.0  HGB 9.0* 9.0* 8.2* 8.6*  HCT 28.7* 27.9* 26.1* 27.7*  MCV 87.5 85.6 86.4 86.3  PLT 416* 399 378 414*   Recent Labs  Lab 11/14/19 0353 11/15/19 0431 11/16/19 0456 11/17/19 0410  NA 135 137 137 137  K 3.8 3.8 4.0 4.1  CL 102 103 102 103  CO2 25 26 27 27   GLUCOSE 152* 99 101* 98  BUN 9 8 6 11   CREATININE 0.51 0.54 0.55 0.48  CALCIUM 8.7* 8.7* 8.8* 8.6*    Signed, , MD [facility] Pager: [phone] (Secure Chat preferred). 11/19/2019

## 2019-11-19 NOTE — Progress Notes (Signed)
Occupational Therapy Treatment Patient Details Name: Colleen Ramirez MRN: [id number] DOB: [birth year] Today's Date: 11/19/2019    History of present illness Colleen Ramirez is a 39 y.o. F admitted to the ED with c/o severe neck pain secondary to a cervical epidural abscess. She is s/p C5-6 anterior cervical discectomy and instrumented fusion. PMH: IVDU, anemia, anxiety, depression, recent admission for facial cellulitis.    OT comments  Pt states that she has been more consistent since last session with tband UE HEP and  states that she continues to perform [facility] exercises with theraputty every other day and folds up her clothes, continued to educate pt on importance of compliance with HEP. Pt repost that her pain in 2/10 in neck and shoulders and that she still has limited ROM in her neck, but no pain in her neck. OT instructed  Pt on gentle neck rotation exercises and pt able to return demo. OT will continue to follow acutely  Follow Up Recommendations  No OT follow up    Equipment Recommendations  None recommended by OT    Recommendations for Other Services      Precautions / Restrictions Precautions Precautions: Cervical Precaution Comments: reviewed cervical precautions with pt, pt with brace off upon arrival Cervical Brace: Soft collar;For comfort Restrictions Weight Bearing Restrictions: No Other Position/Activity Restrictions: pt with brace off upon arrival       Mobility Bed Mobility               General bed mobility comments: pt in recliener upon arrival  Transfers Overall transfer level: Independent Equipment used: None                  Balance Overall balance assessment: Independent                                         ADL either performed or assessed with clinical judgement   ADL Overall ADL's : Modified independent                                             Vision Baseline Vision/History: Wears  glasses Wears Glasses: Reading only Patient Visual Report: No change from baseline     Perception     Praxis      Cognition Arousal/Alertness: Awake/alert Behavior During Therapy: WFL for tasks assessed/performed Overall Cognitive Status: Within Functional Limits for tasks assessed                                          Exercises Other Exercises Other Exercises: Pt states that she has been more consistent since last session with tband UE HEP and  states that she continues to perform [facility] exercises with theraputty every other day and folds up her clothes, continued to educate pt on importance of compliance with HEP Other Exercises: Pt completed bicep curl with theraband (Level 1) Other Exercises: Wrist flexion/extension x10 bilateral wrists Other Exercises: scapular retraction.   Shoulder Instructions       General Comments      Pertinent Vitals/ Pain       Pain Assessment: 0-10 Pain Score: 3  Pain Location: right shoulder/arm and neck Pain Descriptors /  Indicators: Discomfort;Sore;Aching Pain Intervention(s): Monitored during session;Repositioned  Home Living                                          Prior Functioning/Environment              Frequency  Min 1X/week        Progress Toward Goals  OT Goals(current goals can now be found in the care plan section)  Progress towards OT goals: Progressing toward goals  Acute Rehab OT Goals Patient Stated Goal: decrease pain  Plan Frequency remains appropriate    Co-evaluation                 AM-PAC OT "6 Clicks" Daily Activity     Outcome Measure   Help from another person eating meals?: None Help from another person taking care of personal grooming?: None Help from another person toileting, which includes using toliet, bedpan, or urinal?: None Help from another person bathing (including washing, rinsing, drying)?: None Help from another person to put on and  taking off regular upper body clothing?: None Help from another person to put on and taking off regular lower body clothing?: None 6 Click Score: 24    End of Session    OT Visit Diagnosis: Muscle weakness (generalized) (M62.81);Pain Pain - Right/Left: Right   Activity Tolerance Patient tolerated treatment well   Patient Left in chair   Nurse Communication          Time: 7858-8502 OT Time Calculation (min): 16 min  Charges: OT General Charges $OT Visit: 1 Visit OT Treatments $Therapeutic Exercise: 8-22 mins     Britt Bottom 11/19/2019, 2:01 PM

## 2019-11-19 NOTE — Progress Notes (Signed)
Pharmacy Antibiotic Note  Colleen Ramirez is a 39 y.o. female admitted on 09/29/2019 with MRSA cervical epidural abscess/osteo.  Pharmacy consulted for Vancomycin dosing.  ID: Abx for MRSA cervical epidural abscess/osteo now s/p surgical decompression, discectomy and fusion 1/12. Echo negative for vegetations. 6 weeks of abx. No PICC line due to PSA. Patient to remain in hospital for IV abx (through 12/11/19). Rifampn changed to rifabutin d/t starting Xarelto. - 2/14: MRI: progression of discitis/osteo of C4-C7. Persistent ventral greater than dorsal epidural phlegmon spanning the C2-C7 levels, persistent vertebral, retropharyngeal phlegmon.  Remains afebrile, WBC WNL today.  Scr WNL (3/2)  Repeat blood cultures 11/14/19 are negative/final.  Vancomycin AUC checked on 3/1 was therapeutic on Vancomycin 1250 mg IV q12 hr.  Antimicrobials this admission: 1/12 Zosyn>>1/13 1/12 Vanc >> (3/26) 1/14 Rifampin switched 2/22 to Rifabutin d/t DOAC>> (3/26)  Vanc levels: 2/23: VP 17, VT 8 >> AUC 386, (on 1g q12h)>change 1250 Q12 for calc'd AUC 480 3/1: AUC is therapeutic at 424 on Vancomycin 1250 mg IV q12h  1/12 cervical abscess: abundant MRSA, MIC to Vanc < 0.5 1/14 HCV+ 1/14 MRSA PCR positive 2/15 blood x 2: neg 2/27 BCx neg   Plan: -Continue vancomycin at 1250 mg IV q12h -Re-check levels as needed, weekly. - IV abx stop date 3/26   Height: 5\' 3"  (160 cm) Weight: 110 lb (49.9 kg) IBW/kg (Calculated) : 52.4  Temp (24hrs), Avg:98.6 F (37 C), Min:98.5 F (36.9 C), Max:98.8 F (37.1 C)  Recent Labs  Lab 11/14/19 0353 11/15/19 0431 11/15/19 2133 11/16/19 0456 11/17/19 0410 11/19/19 0428  WBC  --  6.6  --  5.5 5.0 7.0  CREATININE 0.51 0.54  --  0.55 0.48  --   VANCOTROUGH  --   --   --  6*  --   --   VANCOPEAK  --   --  20*  --   --   --     Estimated Creatinine Clearance: 74.4 mL/min (by C-G formula based on SCr of 0.48 mg/dL).    No Known Allergies  01/19/20, Noah Delaine Clinical  Pharmacist [phone] or [phone] Please check AMION for all [facility] phone numbers After 10:00 PM, call Main Pharmacy [phone] 11/19/2019  1:32 PM

## 2019-11-19 NOTE — Plan of Care (Signed)
  Problem: Pain Managment: Goal: General experience of comfort will improve Outcome: Progressing   

## 2019-11-20 NOTE — Progress Notes (Signed)
PROGRESS NOTE  Colleen Ramirez  DOB: Jun 16, 1981  PCP: Colleen Ramirez, No Pcp Per [facility]:[id number]  DOA: 09/29/2019 Admitted From: Home  LOS: 52 days   Chief Complaint  Colleen Ramirez presents with  . Generalized Body Aches   Brief narrative: Colleen Ramirez was admitted to the hospital with working diagnosis of sepsis secondary to cervical epiduralMRSAabscesswith discitis vertebral osteomyelitis.Complicated by right arm deep vein thrombosis.Now sp discectomy and fusion of the cervical spine. To complete antibiotic therapy March 50.   39 year old female with significant past medical history for IV drug abuse, anxiety, depression, anemia and status post gastric bypass.  Colleen Ramirez presented to the ED on 1/12 with complaint of 2-day history of severe sharp neck pain, worse with movement.  Recent hospitalization 08/18/19 to12/8/24 for facial cellulitis.  MRI cervical spine with discitis osteomyelitis at C5-6 and C6-7. Pronounced prevertebral/retropharyngeal soft tissue phlegmonous inflammation with areas of nonenhancing material consistent with pus. Epidural phlegmonusinflammation from C3-T1 with a few small areas of nonenhancing material particularly at the C5-6 and C6-7 level consistent with early epidural abscess.  01/12,Colleen Ramirez underwent cervical 5 through cervical 6 anterior discectomy and instrumented fusion.Cultures were positive for MRSA, Colleen Ramirez treated with vancomycin and rifampin.  02/14Follow-up cervical MRI with progression of findings consistent with discitis/osteomyelitis involving C4, C5, C6 and C7 vertebrae. Persistent ventral greater than dorsal epidural phlegmon spanning the C2-C7 levels, persistent vertebral, retropharyngeal phlegmon.  Neurosurgery and ID recommendations to follow-up with aggressive medical therapy,IV antibiotics (IV vancomycin/and porifabutin) until March 26. Colleen Ramirez has a central line on the right IJ for antibiotic therapy, due to bilateral upper extremities  venous thrombosis.  3/1, follow-up cervical spine MRI was obtained because of intermittent fever, worsening neck pain, worsening CRP and sed rate. MRI reported no significant change in cervical spine and no signs of progression of canal narrowing or cord compression.    Subjective: Colleen Ramirez was seen and examined this morning.   Sitting up in chair. No new symptoms.  No fever.   I spoke with her father over the phone today.  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  sp C4 to C7/ sp fusion -See above for the details of imaging findings and surgery. -IV antibiotics to continue until March 26.  History of IV drug abuse/ hepatitis C. -Currently pain controlled with q 3 H hydromorphone as needed  plus scheduledacetaminophen and gabapentin100 mg tid. -Avoid NSAID because of history of peptic ulcer disease. -Will continue to follow inflammatory markers, cell count and temperature curve.  -Colleen Ramirez will need pain clinic for follow up.Colleen Ramirez prefers to have better pain control before starting buprenorphine  Bilateral upper extremity DVT  -right upper extremity superficial and deep vein thrombosis/ cephalic and brachial veins.Left superficial vein thrombosis, basilic and cephalic vein. -No significant pain onupper extremities. -Onrivaroxaban for anticoagulation with good toleration.   Depression and anxiety/ tobacco abuse. -Continue with adderall, aripiprazole, trazodone and bupropion. HasPRN diazepam for anxiety.  Chronic multifactorial anemia. -Hgb stable continue to be stable with Hgb at 8.2. -3/4, iron studies showed low ferritin, low iron saturation, elevated TIBC, all suggestive of iron deficiency anemia. -started on oral iron replacement.  Avoid IV iron in the setting of active infection.  DVT prophylaxis:  Xarelto Antimicrobials:  IV vancomycin and oral rifabutin till March 26 Fluid: None Diet: General  Code Status:   Full code Mobility: Encourage ambulation Family Communication:  None at bedside Discharge plan:  Anticipated date: Due to finish IV antibiotics in March 26  Antimicrobials: Anti-infectives (From admission, onward)   Start  Dose/Rate Route Frequency Ordered Stop   11/10/19 1200  vancomycin (VANCOREADY) IVPB 1250 mg/250 mL     1,250 mg 166.7 mL/hr over 90 Minutes Intravenous Every 12 hours 11/10/19 0627     11/09/19 2200  rifabutin (MYCOBUTIN) capsule 300 mg  Status:  Discontinued     300 mg Oral Every 12 hours 11/09/19 1136 11/09/19 1646   11/09/19 2200  rifabutin (MYCOBUTIN) capsule 300 mg     300 mg Oral Daily 11/09/19 1646     11/05/19 1600  vancomycin (VANCOCIN) IVPB 1000 mg/200 mL premix  Status:  Discontinued     1,000 mg 200 mL/hr over 60 Minutes Intravenous Every 12 hours 11/05/19 1504 11/10/19 0627   11/01/19 1400  vancomycin (VANCOREADY) IVPB 1500 mg/300 mL  Status:  Discontinued     1,500 mg 150 mL/hr over 120 Minutes Intravenous Every 12 hours 11/01/19 1305 11/05/19 0823   10/28/19 1000  vancomycin (VANCOREADY) IVPB 1500 mg/300 mL  Status:  Discontinued     1,500 mg 150 mL/hr over 120 Minutes Intravenous Every 12 hours 10/27/19 2353 11/01/19 1305   10/14/19 1000  vancomycin (VANCOREADY) IVPB 1250 mg/250 mL  Status:  Discontinued     1,250 mg 166.7 mL/hr over 90 Minutes Intravenous Every 12 hours 10/14/19 0540 10/27/19 2353   10/13/19 0945  Vancomycin (VANCOCIN) 1,250 mg in sodium chloride 0.9 % 250 mL IVPB  Status:  Discontinued     1,250 mg 166.7 mL/hr over 90 Minutes Intravenous Every 12 hours 10/13/19 0935 10/14/19 0540   10/13/19 0930  Vancomycin (VANCOCIN) 1,250 mg in sodium chloride 0.9 % 250 mL IVPB  Status:  Discontinued     1,250 mg 166.7 mL/hr over 90 Minutes Intravenous Every 12 hours 10/13/19 0902 10/13/19 0934   10/09/19 2000  vancomycin (VANCOCIN) IVPB 1000 mg/200 mL premix  Status:  Discontinued     1,000 mg 200 mL/hr over 60 Minutes Intravenous Every  12 hours 10/09/19 1121 10/13/19 0902   10/04/19 2200  vancomycin (VANCOCIN) IVPB 750 mg/150 ml premix  Status:  Discontinued     750 mg 150 mL/hr over 60 Minutes Intravenous Every 8 hours 10/04/19 1305 10/09/19 1121   10/04/19 1400  vancomycin (VANCOCIN) IVPB 1000 mg/200 mL premix     1,000 mg 200 mL/hr over 60 Minutes Intravenous STAT 10/04/19 1305 10/04/19 1604   10/01/19 1100  rifampin (RIFADIN) capsule 300 mg  Status:  Discontinued     300 mg Oral Every 12 hours 10/01/19 1056 11/09/19 1136   09/30/19 1800  Vancomycin (VANCOCIN) 1,250 mg in sodium chloride 0.9 % 250 mL IVPB  Status:  Discontinued     1,250 mg 166.7 mL/hr over 90 Minutes Intravenous Every 24 hours 09/30/19 0003 09/30/19 0747   09/30/19 1800  vancomycin (VANCOREADY) IVPB 1250 mg/250 mL  Status:  Discontinued     1,250 mg 166.7 mL/hr over 90 Minutes Intravenous Every 24 hours 09/30/19 0746 10/04/19 1305   09/30/19 0400  piperacillin-tazobactam (ZOSYN) IVPB 3.375 g  Status:  Discontinued     3.375 g 12.5 mL/hr over 240 Minutes Intravenous Every 8 hours 09/30/19 0003 09/30/19 1533   09/29/19 2145  piperacillin-tazobactam (ZOSYN) IVPB 3.375 g     3.375 g 12.5 mL/hr over 240 Minutes Intravenous STAT 09/29/19 2137 09/29/19 2148   09/29/19 2119  bacitracin 50,000 Units in sodium chloride 0.9 % 500 mL irrigation  Status:  Discontinued       As needed 09/29/19 2119 09/29/19 2249   09/29/19 2033  vancomycin (VANCOCIN) 1-5 GM/200ML-% IVPB    Note to Pharmacy: Toney Sang   : cabinet override      09/29/19 2033 09/30/19 0844        Code Status: Full Code   Diet Order            Diet regular Room service appropriate? Yes; Fluid consistency: Thin  Diet effective now              Infusions:  . sodium chloride 250 mL (11/17/19 1800)  . sodium chloride Stopped (11/18/19 1950)  . vancomycin 1,250 mg (11/20/19 9485)    Scheduled Meds: . acetaminophen  1,000 mg Oral TID  . amphetamine-dextroamphetamine  30 mg Oral  BID  . ARIPiprazole  20 mg Oral Daily  . buPROPion  150 mg Oral Daily  . Chlorhexidine Gluconate Cloth  6 each Topical Q0600  . diclofenac Sodium  4 g Topical QID  . feeding supplement (ENSURE ENLIVE)  237 mL Oral BID BM  . ferrous sulfate  325 mg Oral Q breakfast  . gabapentin  100 mg Oral TID  . influenza vac split quadrivalent PF  0.5 mL Intramuscular Tomorrow-1000  . multivitamin with minerals  1 tablet Oral Daily  . nicotine  14 mg Transdermal Daily  . rifabutin  300 mg Oral Daily  . rivaroxaban  15 mg Oral BID WC   Followed by  . [START ON 11/24/2019] rivaroxaban  20 mg Oral Daily  . senna-docusate  1 tablet Oral BID  . sodium chloride flush  10-40 mL Intracatheter Q12H  . sodium chloride flush  3 mL Intravenous Q12H    PRN meds: sodium chloride, cyclobenzaprine, diazepam, HYDROmorphone, lidocaine (PF), ondansetron **OR** ondansetron (ZOFRAN) IV, polyethylene glycol, senna-docusate, sodium chloride, sodium chloride flush, sodium chloride flush, traZODone   Objective: Vitals:   11/20/19 0631 11/20/19 0850  BP: 116/77 110/81  Pulse:  (!) 110  Resp:  18  Temp:  98 F (36.7 C)  SpO2:  100%    Intake/Output Summary (Last 24 hours) at 11/20/2019 1620 Last data filed at 11/20/2019 0200 Gross per 24 hour  Intake 260 ml  Output --  Net 260 ml   Filed Weights   09/29/19 2032  Weight: 49.9 kg   Weight change:  Body mass index is 19.49 kg/m.   Physical Exam: General exam: Sitting up in chair.  Not distressed. Skin: No rashes, lesions or ulcers. HEENT: Atraumatic, normocephalic, supple neck, no obvious bleeding, surgical incision site in the anterior neck intact. Lungs: Clear to auscultation bilaterally CVS: Regular rate and rhythm, no murmur GI/Abd soft, nontender, nondistended, bowel sound present CNS: Alert, awake, oriented x3 Psychiatry: Seems anxious today Extremities: No pedal edema, no calf tenderness  Data Review: I have personally reviewed the laboratory data  and studies available.  Recent Labs  Lab 11/15/19 0431 11/16/19 0456 11/17/19 0410 11/19/19 0428  WBC 6.6 5.5 5.0 7.0  NEUTROABS 4.4 3.6 2.8 5.0  HGB 9.0* 9.0* 8.2* 8.6*  HCT 28.7* 27.9* 26.1* 27.7*  MCV 87.5 85.6 86.4 86.3  PLT 416* 399 378 414*   Recent Labs  Lab 11/14/19 0353 11/15/19 0431 11/16/19 0456 11/17/19 0410  NA 135 137 137 137  K 3.8 3.8 4.0 4.1  CL 102 103 102 103  CO2 25 26 27 27   GLUCOSE 152* 99 101* 98  BUN 9 8 6 11   CREATININE 0.51 0.54 0.55 0.48  CALCIUM 8.7* 8.7* 8.8* 8.6*    Signed, , MD [facility]  Pager: [phone] (Secure Chat preferred). 11/20/2019

## 2019-11-20 NOTE — Plan of Care (Signed)
  Problem: Activity: Goal: Risk for activity intolerance will decrease Outcome: Progressing   Problem: Pain Managment: Goal: General experience of comfort will improve Outcome: Progressing   

## 2019-11-21 LAB — CBC WITH DIFFERENTIAL/PLATELET
Abs Immature Granulocytes: 0.03 10*3/uL (ref 0.00–0.07)
Basophils Absolute: 0.1 10*3/uL (ref 0.0–0.1)
Basophils Relative: 1 %
Eosinophils Absolute: 0.2 10*3/uL (ref 0.0–0.5)
Eosinophils Relative: 4 %
HCT: 31.1 % — ABNORMAL LOW (ref 36.0–46.0)
Hemoglobin: 9.6 g/dL — ABNORMAL LOW (ref 12.0–15.0)
Immature Granulocytes: 1 %
Lymphocytes Relative: 31 %
Lymphs Abs: 1.7 10*3/uL (ref 0.7–4.0)
MCH: 27 pg (ref 26.0–34.0)
MCHC: 30.9 g/dL (ref 30.0–36.0)
MCV: 87.4 fL (ref 80.0–100.0)
Monocytes Absolute: 0.5 10*3/uL (ref 0.1–1.0)
Monocytes Relative: 9 %
Neutro Abs: 2.9 10*3/uL (ref 1.7–7.7)
Neutrophils Relative %: 54 %
Platelets: 432 10*3/uL — ABNORMAL HIGH (ref 150–400)
RBC: 3.56 MIL/uL — ABNORMAL LOW (ref 3.87–5.11)
RDW: 13.3 % (ref 11.5–15.5)
WBC: 5.4 10*3/uL (ref 4.0–10.5)
nRBC: 0 % (ref 0.0–0.2)

## 2019-11-21 LAB — SEDIMENTATION RATE: Sed Rate: 64 mm/hr — ABNORMAL HIGH (ref 0–22)

## 2019-11-21 LAB — BASIC METABOLIC PANEL
Anion gap: 11 (ref 5–15)
BUN: 8 mg/dL (ref 6–20)
CO2: 24 mmol/L (ref 22–32)
Calcium: 9.4 mg/dL (ref 8.9–10.3)
Chloride: 104 mmol/L (ref 98–111)
Creatinine, Ser: 0.6 mg/dL (ref 0.44–1.00)
GFR calc Af Amer: >60 mL/min (ref >60)
GFR calc non Af Amer: >60 mL/min (ref >60)
Glucose, Bld: 165 mg/dL — ABNORMAL HIGH (ref 70–99)
Potassium: 3.7 mmol/L (ref 3.5–5.1)
Sodium: 139 mmol/L (ref 135–145)

## 2019-11-21 LAB — C-REACTIVE PROTEIN: CRP: 4.2 mg/dL — ABNORMAL HIGH (ref <1.0)

## 2019-11-21 NOTE — Progress Notes (Signed)
PROGRESS NOTE  Colleen Ramirez  DOB: [birth year]  PCP: Patient, No Pcp Per MGQ:[id number]  DOA: 09/29/2019 Admitted From: Home  LOS: 53 days   Chief Complaint  Patient presents with  . Generalized Body Aches   Brief narrative: Patient was admitted to the hospital with working diagnosis of sepsis secondary to cervical epiduralMRSAabscesswith discitis vertebral osteomyelitis.Complicated by right arm deep vein thrombosis.Now sp discectomy and fusion of the cervical spine. To complete antibiotic therapy March 55.   39 year old female with significant past medical history for IV drug abuse, anxiety, depression, anemia and status post gastric bypass.  Patient presented to the ED on 1/12 with complaint of 2-day history of severe sharp neck pain, worse with movement.  Recent hospitalization 08/18/19 to12/8/24 for facial cellulitis.  MRI cervical spine with discitis osteomyelitis at C5-6 and C6-7. Pronounced prevertebral/retropharyngeal soft tissue phlegmonous inflammation with areas of nonenhancing material consistent with pus. Epidural phlegmonusinflammation from C3-T1 with a few small areas of nonenhancing material particularly at the C5-6 and C6-7 level consistent with early epidural abscess.  01/12,patient underwent cervical 5 through cervical 6 anterior discectomy and instrumented fusion.Cultures were positive for MRSA, patient treated with vancomycin and rifampin.  02/14Follow-up cervical MRI with progression of findings consistent with discitis/osteomyelitis involving C4, C5, C6 and C7 vertebrae. Persistent ventral greater than dorsal epidural phlegmon spanning the C2-C7 levels, persistent vertebral, retropharyngeal phlegmon.  Neurosurgery and ID recommendations to follow-up with aggressive medical therapy,IV antibiotics (IV vancomycin/and porifabutin) until March 26. Patient has a central line on the right IJ for antibiotic therapy, due to bilateral upper extremities  venous thrombosis.  3/1, follow-up cervical spine MRI was obtained because of intermittent fever, worsening neck pain, worsening CRP and sed rate. MRI reported no significant change in cervical spine and no signs of progression of canal narrowing or cord compression.    Subjective: Patient was seen and examined this morning.   Sitting up in chair. No new symptoms.  No fever.   No new symptoms.  Discussed lab findings from this morning.  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  sp C4 to C7/ sp fusion -See above for the details of imaging findings and surgery. -IV antibiotics to continue until March 26. -Labs from this morning showed CRP and sed rate level improving to 4.2 and 64.  History of IV drug abuse/ hepatitis C. -Currently pain controlled with q3hr hydromorphone as needed plus scheduledacetaminophen and gabapentin100 mg tid. -Avoid NSAID because of history of peptic ulcer disease. -Will continue to follow inflammatory markers, cell count and temperature curve.  -patient will need pain clinic for follow up.Patient prefers to have better pain control before starting buprenorphine  Bilateral upper extremity DVT  -2/15 - right upper extremity superficial and deep vein thrombosis/ cephalic and brachial veins.Left superficial vein thrombosis, basilic and cephalic vein. -No significant pain onupper extremities. -Onrivaroxaban for anticoagulation with good toleration.   Depression and anxiety/ tobacco abuse. -Continue with adderall, aripiprazole, trazodone and bupropion. HasPRN diazepam for anxiety.  Chronic multifactorial anemia. -Hgb stable continue to be stable with Hgb at 8.2. -3/4, iron studies showed low ferritin, low iron saturation, elevated TIBC, all suggestive of iron deficiency anemia. -started on oral iron replacement. Avoid IV iron in the setting of active infection.  DVT prophylaxis:   Xarelto Antimicrobials:  IV vancomycin and oral rifabutin till March 26 Fluid: None Diet: General  Code Status:  Full code Mobility: Encourage ambulation Family Communication:  None at bedside Discharge plan:  Anticipated date: Due to finish  IV antibiotics in March 26  Antimicrobials: Anti-infectives (From admission, onward)   Start     Dose/Rate Route Frequency Ordered Stop   11/10/19 1200  vancomycin (VANCOREADY) IVPB 1250 mg/250 mL     1,250 mg 166.7 mL/hr over 90 Minutes Intravenous Every 12 hours 11/10/19 0627     11/09/19 2200  rifabutin (MYCOBUTIN) capsule 300 mg  Status:  Discontinued     300 mg Oral Every 12 hours 11/09/19 1136 11/09/19 1646   11/09/19 2200  rifabutin (MYCOBUTIN) capsule 300 mg     300 mg Oral Daily 11/09/19 1646     11/05/19 1600  vancomycin (VANCOCIN) IVPB 1000 mg/200 mL premix  Status:  Discontinued     1,000 mg 200 mL/hr over 60 Minutes Intravenous Every 12 hours 11/05/19 1504 11/10/19 0627   11/01/19 1400  vancomycin (VANCOREADY) IVPB 1500 mg/300 mL  Status:  Discontinued     1,500 mg 150 mL/hr over 120 Minutes Intravenous Every 12 hours 11/01/19 1305 11/05/19 0823   10/28/19 1000  vancomycin (VANCOREADY) IVPB 1500 mg/300 mL  Status:  Discontinued     1,500 mg 150 mL/hr over 120 Minutes Intravenous Every 12 hours 10/27/19 2353 11/01/19 1305   10/14/19 1000  vancomycin (VANCOREADY) IVPB 1250 mg/250 mL  Status:  Discontinued     1,250 mg 166.7 mL/hr over 90 Minutes Intravenous Every 12 hours 10/14/19 0540 10/27/19 2353   10/13/19 0945  Vancomycin (VANCOCIN) 1,250 mg in sodium chloride 0.9 % 250 mL IVPB  Status:  Discontinued     1,250 mg 166.7 mL/hr over 90 Minutes Intravenous Every 12 hours 10/13/19 0935 10/14/19 0540   10/13/19 0930  Vancomycin (VANCOCIN) 1,250 mg in sodium chloride 0.9 % 250 mL IVPB  Status:  Discontinued     1,250 mg 166.7 mL/hr over 90 Minutes Intravenous Every 12 hours 10/13/19 0902 10/13/19 0934   10/09/19 2000  vancomycin  (VANCOCIN) IVPB 1000 mg/200 mL premix  Status:  Discontinued     1,000 mg 200 mL/hr over 60 Minutes Intravenous Every 12 hours 10/09/19 1121 10/13/19 0902   10/04/19 2200  vancomycin (VANCOCIN) IVPB 750 mg/150 ml premix  Status:  Discontinued     750 mg 150 mL/hr over 60 Minutes Intravenous Every 8 hours 10/04/19 1305 10/09/19 1121   10/04/19 1400  vancomycin (VANCOCIN) IVPB 1000 mg/200 mL premix     1,000 mg 200 mL/hr over 60 Minutes Intravenous STAT 10/04/19 1305 10/04/19 1604   10/01/19 1100  rifampin (RIFADIN) capsule 300 mg  Status:  Discontinued     300 mg Oral Every 12 hours 10/01/19 1056 11/09/19 1136   09/30/19 1800  Vancomycin (VANCOCIN) 1,250 mg in sodium chloride 0.9 % 250 mL IVPB  Status:  Discontinued     1,250 mg 166.7 mL/hr over 90 Minutes Intravenous Every 24 hours 09/30/19 0003 09/30/19 0747   09/30/19 1800  vancomycin (VANCOREADY) IVPB 1250 mg/250 mL  Status:  Discontinued     1,250 mg 166.7 mL/hr over 90 Minutes Intravenous Every 24 hours 09/30/19 0746 10/04/19 1305   09/30/19 0400  piperacillin-tazobactam (ZOSYN) IVPB 3.375 g  Status:  Discontinued     3.375 g 12.5 mL/hr over 240 Minutes Intravenous Every 8 hours 09/30/19 0003 09/30/19 1533   09/29/19 2145  piperacillin-tazobactam (ZOSYN) IVPB 3.375 g     3.375 g 12.5 mL/hr over 240 Minutes Intravenous STAT 09/29/19 2137 09/29/19 2148   09/29/19 2119  bacitracin 50,000 Units in sodium chloride 0.9 % 500 mL irrigation  Status:  Discontinued       As needed 09/29/19 2119 09/29/19 2249   09/29/19 2033  vancomycin (VANCOCIN) 1-5 GM/200ML-% IVPB    Note to Pharmacy: Toney Sang   : cabinet override      09/29/19 2033 09/30/19 0844        Code Status: Full Code   Diet Order            Diet regular Room service appropriate? Yes; Fluid consistency: Thin  Diet effective now              Infusions:  . sodium chloride 250 mL (11/17/19 1800)  . sodium chloride Stopped (11/18/19 1950)  . vancomycin 1,250 mg  (11/21/19 0545)    Scheduled Meds: . acetaminophen  1,000 mg Oral TID  . amphetamine-dextroamphetamine  30 mg Oral BID  . ARIPiprazole  20 mg Oral Daily  . buPROPion  150 mg Oral Daily  . Chlorhexidine Gluconate Cloth  6 each Topical Q0600  . diclofenac Sodium  4 g Topical QID  . feeding supplement (ENSURE ENLIVE)  237 mL Oral BID BM  . ferrous sulfate  325 mg Oral Q breakfast  . gabapentin  100 mg Oral TID  . influenza vac split quadrivalent PF  0.5 mL Intramuscular Tomorrow-1000  . multivitamin with minerals  1 tablet Oral Daily  . nicotine  14 mg Transdermal Daily  . rifabutin  300 mg Oral Daily  . rivaroxaban  15 mg Oral BID WC   Followed by  . [START ON 11/24/2019] rivaroxaban  20 mg Oral Daily  . senna-docusate  1 tablet Oral BID  . sodium chloride flush  10-40 mL Intracatheter Q12H  . sodium chloride flush  3 mL Intravenous Q12H    PRN meds: sodium chloride, cyclobenzaprine, diazepam, HYDROmorphone, lidocaine (PF), ondansetron **OR** ondansetron (ZOFRAN) IV, polyethylene glycol, senna-docusate, sodium chloride, sodium chloride flush, sodium chloride flush, traZODone   Objective: Vitals:   11/21/19 0514 11/21/19 0729  BP: 94/72 99/75  Pulse: 99 98  Resp: 16 20  Temp: 98.2 F (36.8 C) 97.6 F (36.4 C)  SpO2: 100% 100%    Intake/Output Summary (Last 24 hours) at 11/21/2019 1131 Last data filed at 11/21/2019 0900 Gross per 24 hour  Intake 942 ml  Output --  Net 942 ml   Filed Weights   09/29/19 2032  Weight: 49.9 kg   Weight change:  Body mass index is 19.49 kg/m.   Physical Exam: General exam: Sitting up in chair. Not distressed. Skin: No rashes, lesions or ulcers. HEENT: Atraumatic, normocephalic, supple neck, no obvious bleeding, surgical incision site in the anterior neck intact. Lungs: Clear to auscultation bilaterally, no crackles, no wheezing. CVS: Regular rate and rhythm, no murmur GI/Abd soft, nontender, nondistended, bowel sound present CNS: Alert,  awake, oriented x3 Psychiatry: Seems anxious today Extremities: No pedal edema, no calf tenderness  Data Review: I have personally reviewed the laboratory data and studies available.  Recent Labs  Lab 11/15/19 0431 11/16/19 0456 11/17/19 0410 11/19/19 0428 11/21/19 0412  WBC 6.6 5.5 5.0 7.0 5.4  NEUTROABS 4.4 3.6 2.8 5.0 2.9  HGB 9.0* 9.0* 8.2* 8.6* 9.6*  HCT 28.7* 27.9* 26.1* 27.7* 31.1*  MCV 87.5 85.6 86.4 86.3 87.4  PLT 416* 399 378 414* 432*   Recent Labs  Lab 11/15/19 0431 11/16/19 0456 11/17/19 0410 11/21/19 0412  NA 137 137 137 139  K 3.8 4.0 4.1 3.7  CL 103 102 103 104  CO2 26 27 27 24   GLUCOSE  99 101* 98 165*  BUN 8 6 11 8   CREATININE 0.54 0.55 0.48 0.60  CALCIUM 8.7* 8.8* 8.6* 9.4    Signed, , MD [facility] Pager: [phone] (Secure Chat preferred). 11/21/2019

## 2019-11-21 NOTE — Plan of Care (Signed)
  Problem: Clinical Measurements: Goal: Will remain free from infection Outcome: Progressing Note: Pt has shown no new signs of infection during my care.    Problem: Activity: Goal: Risk for activity intolerance will decrease Outcome: Progressing Note: Pt has been able to walk in hallway during my care. Pt requested RN to contact MD about letting her go outside. Pt stated that MD had previously told her she could. MD did not approve this order and pt was told this.    Problem: Nutrition: Goal: Adequate nutrition will be maintained Outcome: Progressing   Problem: Pain Managment: Goal: General experience of comfort will improve Outcome: Progressing Note: Pt continues to request her pain medications when it is due to manage pain 6-7/10 in her neck during my care.

## 2019-11-21 NOTE — Plan of Care (Signed)
  Problem: Skin Integrity: Goal: Risk for impaired skin integrity will decrease Outcome: Progressing   Problem: Pain Managment: Goal: General experience of comfort will improve Outcome: Progressing   Problem: Elimination: Goal: Will not experience complications related to bowel motility Outcome: Progressing   Problem: Coping: Goal: Level of anxiety will decrease Outcome: Progressing   Problem: Clinical Measurements: Goal: Will remain free from infection Outcome: Progressing   Problem: Clinical Measurements: Goal: Diagnostic test results will improve Outcome: Progressing

## 2019-11-22 LAB — VANCOMYCIN, PEAK: Vancomycin Pk: 30 ug/mL (ref 30–40)

## 2019-11-22 MED ORDER — ALTEPLASE 2 MG IJ SOLR
2.0000 mg | Freq: Once | INTRAMUSCULAR | Status: AC
  Administered 2019-11-22: 2 mg
  Filled 2019-11-22: qty 2

## 2019-11-22 NOTE — Progress Notes (Signed)
PROGRESS NOTE    Colleen Ramirez  VVO:[id number] DOB: [birth year] DOA: 09/29/2019 PCP: Patient, No Pcp Per   Brief Narrative:  Patient is a 39 year old female with history of IV drug abuse, anxiety, depression, anemia, status post gastric bypass who presented to the emergency department on 1/12 with complaints of 2-day history of sharp  neck pain.  She was recently hospitalized from 12/20-2/24 for facial cellulitis.  MRI cervical spine showed discitis, osteomyelitis at C5-C6 and C6-7.  Found to have prevertebral/retropharyngeal soft tissue phlegmonous inflammation with areas of nonenhancing material consistent with pus.  Underwent C5-C6 anterior discectomy, instrumented fusion cultures positive for MRSA.  Started on vancomycin, rifampin.  Follow-up cervical MRI showed progression of findings consistent with discitis/osteomyelitis involving C4, C5, C6 and C7 vertebrae.  Neurosurgery and ID recommended to continue aggressive medical therapy.  Current plan is to continue IV antibiotics till March 26.    Assessment & Plan:   Principal Problem:   Abscess in epidural space of cervical spine Active Problems:   Depression   Acquired fusion of cervical spine   IVDU (intravenous drug user)   Homeless   S/P cervical discectomy   Anxiety   Status post gastric bypass for obesity   Hepatitis C antibody positive in blood   Acute bilateral thoracic back pain   Body aches   Methamphetamine abuse (HCC)   Pain of right sternoclavicular joint   Cervical spine epidural abscess with vertebral osteomyelitis: Status post C4-C7 fusion.  Plan is to continue IV antibiotics till March 26. Neurosurgery and ID were following.  History of IV drug abuse/hepatitis C: Needs follow up at pain management clinic / drug rehabilitation .  Needs to follow-up as an outpatient for hepatitis C management.  We'll try to taper pain medications.  Bilateral upper extremity DVT: Ultrasound showed right upper extremity superficial  and deep venous thrombosis of cephalic and brachial veins and right superficial vein thrombosis of basilic and  cephalic vein.  On Xarelto  Depression/anxiety/drug abuse: On Adderall, aripiprazole, trazodone, bupropion.  Chronic normocytic anemia: Currently hemoglobin stable in the range of 8.  Iron studies showed iron deficiency.  Started on oral iron replacement.  Nutrition Problem: Increased nutrient needs Etiology: post-op healing      DVT prophylaxis: Xarelto Code Status: Full Family Communication: None present at the bedside Disposition Plan: Patient is from home.  Needs to continue IV antibiotics till March 26.  Expected discharge to home after completion of IV antibiotics.   Consultants: ID, neurosurgery  Procedures: As above  Antimicrobials:  Anti-infectives (From admission, onward)   Start     Dose/Rate Route Frequency Ordered Stop   11/10/19 1200  vancomycin (VANCOREADY) IVPB 1250 mg/250 mL     1,250 mg 166.7 mL/hr over 90 Minutes Intravenous Every 12 hours 11/10/19 0627     11/09/19 2200  rifabutin (MYCOBUTIN) capsule 300 mg  Status:  Discontinued     300 mg Oral Every 12 hours 11/09/19 1136 11/09/19 1646   11/09/19 2200  rifabutin (MYCOBUTIN) capsule 300 mg     300 mg Oral Daily 11/09/19 1646     11/05/19 1600  vancomycin (VANCOCIN) IVPB 1000 mg/200 mL premix  Status:  Discontinued     1,000 mg 200 mL/hr over 60 Minutes Intravenous Every 12 hours 11/05/19 1504 11/10/19 0627   11/01/19 1400  vancomycin (VANCOREADY) IVPB 1500 mg/300 mL  Status:  Discontinued     1,500 mg 150 mL/hr over 120 Minutes Intravenous Every 12 hours 11/01/19 1305 11/05/19 2694  10/28/19 1000  vancomycin (VANCOREADY) IVPB 1500 mg/300 mL  Status:  Discontinued     1,500 mg 150 mL/hr over 120 Minutes Intravenous Every 12 hours 10/27/19 2353 11/01/19 1305   10/14/19 1000  vancomycin (VANCOREADY) IVPB 1250 mg/250 mL  Status:  Discontinued     1,250 mg 166.7 mL/hr over 90 Minutes Intravenous  Every 12 hours 10/14/19 0540 10/27/19 2353   10/13/19 0945  Vancomycin (VANCOCIN) 1,250 mg in sodium chloride 0.9 % 250 mL IVPB  Status:  Discontinued     1,250 mg 166.7 mL/hr over 90 Minutes Intravenous Every 12 hours 10/13/19 0935 10/14/19 0540   10/13/19 0930  Vancomycin (VANCOCIN) 1,250 mg in sodium chloride 0.9 % 250 mL IVPB  Status:  Discontinued     1,250 mg 166.7 mL/hr over 90 Minutes Intravenous Every 12 hours 10/13/19 0902 10/13/19 0934   10/09/19 2000  vancomycin (VANCOCIN) IVPB 1000 mg/200 mL premix  Status:  Discontinued     1,000 mg 200 mL/hr over 60 Minutes Intravenous Every 12 hours 10/09/19 1121 10/13/19 0902   10/04/19 2200  vancomycin (VANCOCIN) IVPB 750 mg/150 ml premix  Status:  Discontinued     750 mg 150 mL/hr over 60 Minutes Intravenous Every 8 hours 10/04/19 1305 10/09/19 1121   10/04/19 1400  vancomycin (VANCOCIN) IVPB 1000 mg/200 mL premix     1,000 mg 200 mL/hr over 60 Minutes Intravenous STAT 10/04/19 1305 10/04/19 1604   10/01/19 1100  rifampin (RIFADIN) capsule 300 mg  Status:  Discontinued     300 mg Oral Every 12 hours 10/01/19 1056 11/09/19 1136   09/30/19 1800  Vancomycin (VANCOCIN) 1,250 mg in sodium chloride 0.9 % 250 mL IVPB  Status:  Discontinued     1,250 mg 166.7 mL/hr over 90 Minutes Intravenous Every 24 hours 09/30/19 0003 09/30/19 0747   09/30/19 1800  vancomycin (VANCOREADY) IVPB 1250 mg/250 mL  Status:  Discontinued     1,250 mg 166.7 mL/hr over 90 Minutes Intravenous Every 24 hours 09/30/19 0746 10/04/19 1305   09/30/19 0400  piperacillin-tazobactam (ZOSYN) IVPB 3.375 g  Status:  Discontinued     3.375 g 12.5 mL/hr over 240 Minutes Intravenous Every 8 hours 09/30/19 0003 09/30/19 1533   09/29/19 2145  piperacillin-tazobactam (ZOSYN) IVPB 3.375 g     3.375 g 12.5 mL/hr over 240 Minutes Intravenous STAT 09/29/19 2137 09/29/19 2148   09/29/19 2119  bacitracin 50,000 Units in sodium chloride 0.9 % 500 mL irrigation  Status:  Discontinued        As needed 09/29/19 2119 09/29/19 2249   09/29/19 2033  vancomycin (VANCOCIN) 1-5 GM/200ML-% IVPB    Note to Pharmacy: Toney Sang   : cabinet override      09/29/19 2033 09/30/19 0844      Subjective: Patient seen and examined at the bedside this morning.  Medically stable.  Comfortable.  Asking if she can go outside the hospital.  She was also concerned about the surgical scar on on her neck which is noninflamed and not suspicious for any infection  Objective: Vitals:   11/21/19 2100 11/22/19 0500 11/22/19 0742 11/22/19 0756  BP: 94/70 99/66  113/73  Pulse: 99 99  (!) 109  Resp: 16 16 18 18   Temp: 98.5 F (36.9 C) 98.5 F (36.9 C)  97.9 F (36.6 C)  TempSrc: Oral Oral  Oral  SpO2: 100% 99%  98%  Weight:      Height:        Intake/Output Summary (  Last 24 hours) at 11/22/2019 1018 Last data filed at 11/22/2019 4709 Gross per 24 hour  Intake 360 ml  Output --  Net 360 ml   Filed Weights   09/29/19 2032  Weight: 49.9 kg    Examination:  General exam: Appears calm and comfortable ,Not in distress,average built Respiratory system: Bilateral equal air entry, normal vesicular breath sounds, no wheezes or crackles  Cardiovascular system: S1 & S2 heard, RRR. No JVD, murmurs, rubs, gallops or clicks. No pedal edema. Gastrointestinal system: Abdomen is nondistended, soft and nontender. No organomegaly or masses felt. Normal bowel sounds heard. Central nervous system: Alert and oriented. No focal neurological deficits. Extremities: No edema, no clubbing ,no cyanosis Skin: No rashes, lesions or ulcers,no icterus ,no pallor Clean surgical scar on her neck   Data Reviewed: I have personally reviewed following labs and imaging studies  CBC: Recent Labs  Lab 11/16/19 0456 11/17/19 0410 11/19/19 0428 11/21/19 0412  WBC 5.5 5.0 7.0 5.4  NEUTROABS 3.6 2.8 5.0 2.9  HGB 9.0* 8.2* 8.6* 9.6*  HCT 27.9* 26.1* 27.7* 31.1*  MCV 85.6 86.4 86.3 87.4  PLT 399 378 414* 432*   Basic  Metabolic Panel: Recent Labs  Lab 11/16/19 0456 11/17/19 0410 11/21/19 0412  NA 137 137 139  K 4.0 4.1 3.7  CL 102 103 104  CO2 27 27 24   GLUCOSE 101* 98 165*  BUN 6 11 8   CREATININE 0.55 0.48 0.60  CALCIUM 8.8* 8.6* 9.4   GFR: Estimated Creatinine Clearance: 74.4 mL/min (by C-G formula based on SCr of 0.6 mg/dL). Liver Function Tests: No results for input(s): AST, ALT, ALKPHOS, BILITOT, PROT, ALBUMIN in the last 168 hours. No results for input(s): LIPASE, AMYLASE in the last 168 hours. No results for input(s): AMMONIA in the last 168 hours. Coagulation Profile: No results for input(s): INR, PROTIME in the last 168 hours. Cardiac Enzymes: No results for input(s): CKTOTAL, CKMB, CKMBINDEX, TROPONINI in the last 168 hours. BNP (last 3 results) No results for input(s): PROBNP in the last 8760 hours. HbA1C: No results for input(s): HGBA1C in the last 72 hours. CBG: No results for input(s): GLUCAP in the last 168 hours. Lipid Profile: No results for input(s): CHOL, HDL, LDLCALC, TRIG, CHOLHDL, LDLDIRECT in the last 72 hours. Thyroid Function Tests: No results for input(s): TSH, T4TOTAL, FREET4, T3FREE, THYROIDAB in the last 72 hours. Anemia Panel: No results for input(s): VITAMINB12, FOLATE, FERRITIN, TIBC, IRON, RETICCTPCT in the last 72 hours. Sepsis Labs: No results for input(s): PROCALCITON, LATICACIDVEN in the last 168 hours.  Recent Results (from the past 240 hour(s))  Culture, blood (routine x 2)     Status: None   Collection Time: 11/14/19  3:25 PM   Specimen: BLOOD  Result Value Ref Range Status   Specimen Description BLOOD LEFT ANTECUBITAL  Final   Special Requests   Final    BOTTLES DRAWN AEROBIC AND ANAEROBIC Blood Culture results may not be optimal due to an inadequate volume of blood received in culture bottles   Culture   Final    NO GROWTH 5 DAYS Performed at [facility], [address] [place], [address] [place]    Report Status 11/19/2019 FINAL   Final  Culture, blood (routine x 2)     Status: None   Collection Time: 11/14/19  3:30 PM   Specimen: BLOOD LEFT HAND  Result Value Ref Range Status   Specimen Description BLOOD LEFT HAND  Final   Special Requests  Final    BOTTLES DRAWN AEROBIC AND ANAEROBIC Blood Culture results may not be optimal due to an inadequate volume of blood received in culture bottles   Culture   Final    NO GROWTH 5 DAYS Performed at [facility], [place] [address], [place], [place] [address]    Report Status 11/19/2019 FINAL  Final         Radiology Studies: No results found.      Scheduled Meds: . acetaminophen  1,000 mg Oral TID  . amphetamine-dextroamphetamine  30 mg Oral BID  . ARIPiprazole  20 mg Oral Daily  . buPROPion  150 mg Oral Daily  . Chlorhexidine Gluconate Cloth  6 each Topical Q0600  . diclofenac Sodium  4 g Topical QID  . feeding supplement (ENSURE ENLIVE)  237 mL Oral BID BM  . ferrous sulfate  325 mg Oral Q breakfast  . gabapentin  100 mg Oral TID  . influenza vac split quadrivalent PF  0.5 mL Intramuscular Tomorrow-1000  . multivitamin with minerals  1 tablet Oral Daily  . nicotine  14 mg Transdermal Daily  . rifabutin  300 mg Oral Daily  . rivaroxaban  15 mg Oral BID WC   Followed by  . [START ON 11/24/2019] rivaroxaban  20 mg Oral Daily  . senna-docusate  1 tablet Oral BID  . sodium chloride flush  10-40 mL Intracatheter Q12H  . sodium chloride flush  3 mL Intravenous Q12H   Continuous Infusions: . sodium chloride 250 mL (11/17/19 1800)  . sodium chloride Stopped (11/18/19 1950)  . vancomycin 1,250 mg (11/22/19 0435)     LOS: 54 days    Time spent: 25 mins.More than 50% of that time was spent in counseling and/or coordination of care.      Shelly Coss, MD [facility] P3/03/2020, 10:18 AM

## 2019-11-22 NOTE — Plan of Care (Signed)
  Problem: Clinical Measurements: Goal: Will remain free from infection Outcome: Progressing Note: IV antibiotics as scheduled. Afebrile.  Goal: Respiratory complications will improve Outcome: Progressing Note: No acute changes in respiratory status. Remains on RA    Problem: Activity: Goal: Risk for activity intolerance will decrease Outcome: Completed/Met Note: Pt able to ambulate in room and in the hallway multiple times throughout shift independently.

## 2019-11-23 LAB — VANCOMYCIN, TROUGH: Vancomycin Tr: 12 ug/mL — ABNORMAL LOW (ref 15–20)

## 2019-11-23 NOTE — Progress Notes (Addendum)
Occupational Therapy Treatment Patient Details Name: Colleen Ramirez MRN: [id number] DOB: 06-19-1981 Today's Date: 11/23/2019    History of present illness Colleen Ramirez is a 39 y.o. F admitted to the ED with c/o severe neck pain secondary to a cervical epidural abscess. She is s/p C5-6 anterior cervical discectomy and instrumented fusion. PMH: IVDU, anemia, anxiety, depression, recent admission for facial cellulitis.    OT comments  Pt continues to progress towards OT goals. Focus of session on bil UE HEP including strengthening, stretching and ROM as Pt continues to have bil UE weakness, she reports most notably in bil shoulders, reports increased effort with doffing shirts, with overhead reaching and when attempting to pick up or open light weight tems. Pt using level 2 theraband intermittently for HEP, return demonstrating exercises with min cues throughout.  Feel POC remains appropriate at this time. Will continue to follow while acutely admitted.   Follow Up Recommendations  No OT follow up    Equipment Recommendations  None recommended by OT          Precautions / Restrictions Precautions Precautions: Cervical Precaution Comments: pt with brace off upon arrival, reports typically wears during hallway ambulation Cervical Brace: Soft collar;For comfort Restrictions Weight Bearing Restrictions: No       Mobility Bed Mobility                  Transfers Overall transfer level: Independent Equipment used: None                      ADL either performed or assessed with clinical judgement   ADL Overall ADL's : Modified independent                                                               Cognition Arousal/Alertness: Awake/alert Behavior During Therapy: WFL for tasks assessed/performed Overall Cognitive Status: Within Functional Limits for tasks assessed                                          Exercises  Exercises: Other exercises;General Upper Extremity General Exercises - Upper Extremity Shoulder Flexion: AROM;Both;Standing(to 90*) Shoulder Horizontal ABduction: AROM;Both;10 reps;Theraband Theraband Level (Shoulder Horizontal Abduction): Level 2 (Red) Shoulder Horizontal ADduction: AROM;Both;10 reps;Standing;Theraband Theraband Level (Shoulder Horizontal Adduction): Level 2 (Red) Elbow Flexion: AROM;Both;10 reps;Seated;Theraband Theraband Level (Elbow Flexion): Level 2 (Red) Elbow Extension: AROM;Both;10 reps;Standing;Theraband Theraband Level (Elbow Extension): Level 2 (Red) Other Exercises Other Exercises: scapular retract and hold   Shoulder Instructions       General Comments      Pertinent Vitals/ Pain       Pain Assessment: Faces Faces Pain Scale: Hurts a little bit Pain Location: bil shoulders, cervical region Pain Descriptors / Indicators: Discomfort Pain Intervention(s): Monitored during session  Home Living                                          Prior Functioning/Environment              Frequency  Min 1X/week  Progress Toward Goals  OT Goals(current goals can now be found in the care plan section)  Progress towards OT goals: Progressing toward goals  Acute Rehab OT Goals Patient Stated Goal: decrease pain OT Goal Formulation: With patient Time For Goal Achievement: 12/07/19 Potential to Achieve Goals: Good  Plan Discharge plan remains appropriate;Frequency remains appropriate    Co-evaluation                 AM-PAC OT "6 Clicks" Daily Activity     Outcome Measure   Help from another person eating meals?: None Help from another person taking care of personal grooming?: None Help from another person toileting, which includes using toliet, bedpan, or urinal?: None Help from another person bathing (including washing, rinsing, drying)?: None Help from another person to put on and taking off regular upper body  clothing?: None Help from another person to put on and taking off regular lower body clothing?: None 6 Click Score: 24    End of Session    OT Visit Diagnosis: Muscle weakness (generalized) (M62.81);Pain Pain - Right/Left: Right Pain - part of body: Shoulder;Arm   Activity Tolerance Patient tolerated treatment well   Patient Left with call bell/phone within reach   Nurse Communication Mobility status        Time: 5631-4970 OT Time Calculation (min): 23 min  Charges: OT General Charges $OT Visit: 1 Visit OT Treatments $Therapeutic Activity: 8-22 mins  Marcy Siren, OT Acute Rehabilitation Services Pager [phone] Office [phone]     Orlando Penner 11/23/2019, 4:58 PM

## 2019-11-23 NOTE — Progress Notes (Signed)
PROGRESS NOTE    Colleen Ramirez  IDP:[id number] DOB: [birth year] DOA: 09/29/2019 PCP: Patient, No Pcp Per   Brief Narrative:  Patient is a 39 year old female with history of IV drug abuse, anxiety, depression, anemia, status post gastric bypass who presented to the emergency department on 1/12 with complaints of 2-day history of sharp  neck pain.  She was recently hospitalized from 12/20-2/24 for facial cellulitis.  MRI cervical spine showed discitis, osteomyelitis at C5-C6 and C6-7.  Found to have prevertebral/retropharyngeal soft tissue phlegmonous inflammation with areas of nonenhancing material consistent with pus.  Underwent C5-C6 anterior discectomy, instrumented fusion cultures positive for MRSA.  Started on vancomycin, rifampin.  Follow-up cervical MRI showed progression of findings consistent with discitis/osteomyelitis involving C4, C5, C6 and C7 vertebrae.  Neurosurgery and ID recommended to continue aggressive medical therapy.  Current plan is to continue IV antibiotics till March 26.    Assessment & Plan:   Principal Problem:   Abscess in epidural space of cervical spine Active Problems:   Depression   Acquired fusion of cervical spine   IVDU (intravenous drug user)   Homeless   S/P cervical discectomy   Anxiety   Status post gastric bypass for obesity   Hepatitis C antibody positive in blood   Acute bilateral thoracic back pain   Body aches   Methamphetamine abuse (HCC)   Pain of right sternoclavicular joint   Cervical spine epidural abscess with vertebral osteomyelitis: Status post C4-C7 fusion.  Plan is to continue IV antibiotics till March 26. Neurosurgery and ID were following.  History of IV drug abuse/hepatitis C: Needs follow up at pain management clinic / drug rehabilitation .  Needs to follow-up as an outpatient for hepatitis C management.  We'll try to taper pain medications.  Bilateral upper extremity DVT: Ultrasound showed right upper extremity superficial  and deep venous thrombosis of cephalic and brachial veins and right superficial vein thrombosis of basilic and  cephalic vein.  On Xarelto  Depression/anxiety/drug abuse: On Adderall, aripiprazole, trazodone, bupropion.  Chronic normocytic anemia: Currently hemoglobin stable in the range of 8.  Iron studies showed iron deficiency.  Started on oral iron replacement.  Nutrition Problem: Increased nutrient needs Etiology: post-op healing      DVT prophylaxis: Xarelto Code Status: Full Family Communication: None present at the bedside Disposition Plan: Patient is from home.  Needs to continue IV antibiotics till March 26.  Expected discharge to home after completion of IV antibiotics.   Consultants: ID, neurosurgery  Procedures: As above  Antimicrobials:  Anti-infectives (From admission, onward)   Start     Dose/Rate Route Frequency Ordered Stop   11/10/19 1200  vancomycin (VANCOREADY) IVPB 1250 mg/250 mL     1,250 mg 166.7 mL/hr over 90 Minutes Intravenous Every 12 hours 11/10/19 0627     11/09/19 2200  rifabutin (MYCOBUTIN) capsule 300 mg  Status:  Discontinued     300 mg Oral Every 12 hours 11/09/19 1136 11/09/19 1646   11/09/19 2200  rifabutin (MYCOBUTIN) capsule 300 mg     300 mg Oral Daily 11/09/19 1646     11/05/19 1600  vancomycin (VANCOCIN) IVPB 1000 mg/200 mL premix  Status:  Discontinued     1,000 mg 200 mL/hr over 60 Minutes Intravenous Every 12 hours 11/05/19 1504 11/10/19 0627   11/01/19 1400  vancomycin (VANCOREADY) IVPB 1500 mg/300 mL  Status:  Discontinued     1,500 mg 150 mL/hr over 120 Minutes Intravenous Every 12 hours 11/01/19 1305 11/05/19 4431  10/28/19 1000  vancomycin (VANCOREADY) IVPB 1500 mg/300 mL  Status:  Discontinued     1,500 mg 150 mL/hr over 120 Minutes Intravenous Every 12 hours 10/27/19 2353 11/01/19 1305   10/14/19 1000  vancomycin (VANCOREADY) IVPB 1250 mg/250 mL  Status:  Discontinued     1,250 mg 166.7 mL/hr over 90 Minutes Intravenous  Every 12 hours 10/14/19 0540 10/27/19 2353   10/13/19 0945  Vancomycin (VANCOCIN) 1,250 mg in sodium chloride 0.9 % 250 mL IVPB  Status:  Discontinued     1,250 mg 166.7 mL/hr over 90 Minutes Intravenous Every 12 hours 10/13/19 0935 10/14/19 0540   10/13/19 0930  Vancomycin (VANCOCIN) 1,250 mg in sodium chloride 0.9 % 250 mL IVPB  Status:  Discontinued     1,250 mg 166.7 mL/hr over 90 Minutes Intravenous Every 12 hours 10/13/19 0902 10/13/19 0934   10/09/19 2000  vancomycin (VANCOCIN) IVPB 1000 mg/200 mL premix  Status:  Discontinued     1,000 mg 200 mL/hr over 60 Minutes Intravenous Every 12 hours 10/09/19 1121 10/13/19 0902   10/04/19 2200  vancomycin (VANCOCIN) IVPB 750 mg/150 ml premix  Status:  Discontinued     750 mg 150 mL/hr over 60 Minutes Intravenous Every 8 hours 10/04/19 1305 10/09/19 1121   10/04/19 1400  vancomycin (VANCOCIN) IVPB 1000 mg/200 mL premix     1,000 mg 200 mL/hr over 60 Minutes Intravenous STAT 10/04/19 1305 10/04/19 1604   10/01/19 1100  rifampin (RIFADIN) capsule 300 mg  Status:  Discontinued     300 mg Oral Every 12 hours 10/01/19 1056 11/09/19 1136   09/30/19 1800  Vancomycin (VANCOCIN) 1,250 mg in sodium chloride 0.9 % 250 mL IVPB  Status:  Discontinued     1,250 mg 166.7 mL/hr over 90 Minutes Intravenous Every 24 hours 09/30/19 0003 09/30/19 0747   09/30/19 1800  vancomycin (VANCOREADY) IVPB 1250 mg/250 mL  Status:  Discontinued     1,250 mg 166.7 mL/hr over 90 Minutes Intravenous Every 24 hours 09/30/19 0746 10/04/19 1305   09/30/19 0400  piperacillin-tazobactam (ZOSYN) IVPB 3.375 g  Status:  Discontinued     3.375 g 12.5 mL/hr over 240 Minutes Intravenous Every 8 hours 09/30/19 0003 09/30/19 1533   09/29/19 2145  piperacillin-tazobactam (ZOSYN) IVPB 3.375 g     3.375 g 12.5 mL/hr over 240 Minutes Intravenous STAT 09/29/19 2137 09/29/19 2148   09/29/19 2119  bacitracin 50,000 Units in sodium chloride 0.9 % 500 mL irrigation  Status:  Discontinued        As needed 09/29/19 2119 09/29/19 2249   09/29/19 2033  vancomycin (VANCOCIN) 1-5 GM/200ML-% IVPB    Note to Pharmacy: Ubaldo Glassing   : cabinet override      09/29/19 2033 09/30/19 0844      Subjective: Patient seen and examined at the bedside this morning.  Hemodynamically stable.  Sitting on the chair and eating her breakfast.  She was requesting me to allow her to go out of the hospital which I politely denied  Objective: Vitals:   11/22/19 0756 11/22/19 1453 11/22/19 2100 11/23/19 0500  BP: 113/73 103/74 98/71 95/64   Pulse: (!) 109 (!) 106 (!) 102 99  Resp: 18 18 18 18   Temp: 97.9 F (36.6 C) 98.5 F (36.9 C) 98.2 F (36.8 C) 98.5 F (36.9 C)  TempSrc: Oral Oral Oral Oral  SpO2: 98% 100% 100% 100%  Weight:      Height:        Intake/Output Summary (Last  24 hours) at 11/23/2019 0746 Last data filed at 11/22/2019 1621 Gross per 24 hour  Intake 840 ml  Output --  Net 840 ml   Filed Weights   09/29/19 2032  Weight: 49.9 kg    Examination:  General exam: Appears calm and comfortable ,Not in distress,average built Respiratory system:  no wheezes or crackles  Cardiovascular system: S1 & S2 heard Gastrointestinal system: Abdomen is nondistended, soft and nontender.  Central nervous system: Alert and oriented. No focal neurological deficits. Extremities: No edema, no clubbing ,no cyanosis Skin: No rashes, lesions or ulcers,no icterus ,no pallor Clean surgical scar on her neck   Data Reviewed: I have personally reviewed following labs and imaging studies  CBC: Recent Labs  Lab 11/17/19 0410 11/19/19 0428 11/21/19 0412  WBC 5.0 7.0 5.4  NEUTROABS 2.8 5.0 2.9  HGB 8.2* 8.6* 9.6*  HCT 26.1* 27.7* 31.1*  MCV 86.4 86.3 87.4  PLT 378 414* 432*   Basic Metabolic Panel: Recent Labs  Lab 11/17/19 0410 11/21/19 0412  NA 137 139  K 4.1 3.7  CL 103 104  CO2 27 24  GLUCOSE 98 165*  BUN 11 8  CREATININE 0.48 0.60  CALCIUM 8.6* 9.4   GFR: Estimated Creatinine  Clearance: 74.4 mL/min (by C-G formula based on SCr of 0.6 mg/dL). Liver Function Tests: No results for input(s): AST, ALT, ALKPHOS, BILITOT, PROT, ALBUMIN in the last 168 hours. No results for input(s): LIPASE, AMYLASE in the last 168 hours. No results for input(s): AMMONIA in the last 168 hours. Coagulation Profile: No results for input(s): INR, PROTIME in the last 168 hours. Cardiac Enzymes: No results for input(s): CKTOTAL, CKMB, CKMBINDEX, TROPONINI in the last 168 hours. BNP (last 3 results) No results for input(s): PROBNP in the last 8760 hours. HbA1C: No results for input(s): HGBA1C in the last 72 hours. CBG: No results for input(s): GLUCAP in the last 168 hours. Lipid Profile: No results for input(s): CHOL, HDL, LDLCALC, TRIG, CHOLHDL, LDLDIRECT in the last 72 hours. Thyroid Function Tests: No results for input(s): TSH, T4TOTAL, FREET4, T3FREE, THYROIDAB in the last 72 hours. Anemia Panel: No results for input(s): VITAMINB12, FOLATE, FERRITIN, TIBC, IRON, RETICCTPCT in the last 72 hours. Sepsis Labs: No results for input(s): PROCALCITON, LATICACIDVEN in the last 168 hours.  Recent Results (from the past 240 hour(s))  Culture, blood (routine x 2)     Status: None   Collection Time: 11/14/19  3:25 PM   Specimen: BLOOD  Result Value Ref Range Status   Specimen Description BLOOD LEFT ANTECUBITAL  Final   Special Requests   Final    BOTTLES DRAWN AEROBIC AND ANAEROBIC Blood Culture results may not be optimal due to an inadequate volume of blood received in culture bottles   Culture   Final    NO GROWTH 5 DAYS Performed at [facility], [address], [place], [place] [address]    Report Status 11/19/2019 FINAL  Final  Culture, blood (routine x 2)     Status: None   Collection Time: 11/14/19  3:30 PM   Specimen: BLOOD LEFT HAND  Result Value Ref Range Status   Specimen Description BLOOD LEFT HAND  Final   Special Requests   Final    BOTTLES DRAWN AEROBIC AND  ANAEROBIC Blood Culture results may not be optimal due to an inadequate volume of blood received in culture bottles   Culture   Final    NO GROWTH 5 DAYS Performed at [facility], [address], [place], [place] [address]    Report Status 11/19/2019 FINAL  Final         Radiology Studies: No results found.      Scheduled Meds: . acetaminophen  1,000 mg Oral TID  . amphetamine-dextroamphetamine  30 mg Oral BID  . ARIPiprazole  20 mg Oral Daily  . buPROPion  150 mg Oral Daily  . Chlorhexidine Gluconate Cloth  6 each Topical Q0600  . diclofenac Sodium  4 g Topical QID  . feeding supplement (ENSURE ENLIVE)  237 mL Oral BID BM  . ferrous sulfate  325 mg Oral Q breakfast  . gabapentin  100 mg Oral TID  . influenza vac split quadrivalent PF  0.5 mL Intramuscular Tomorrow-1000  . multivitamin with minerals  1 tablet Oral Daily  . nicotine  14 mg Transdermal Daily  . rifabutin  300 mg Oral Daily  . rivaroxaban  15 mg Oral BID WC   Followed by  . [START ON 11/24/2019] rivaroxaban  20 mg Oral Daily  . senna-docusate  1 tablet Oral BID  . sodium chloride flush  10-40 mL Intracatheter Q12H  . sodium chloride flush  3 mL Intravenous Q12H   Continuous Infusions: . sodium chloride 250 mL (11/17/19 1800)  . sodium chloride Stopped (11/18/19 1950)  . vancomycin 1,250 mg (11/23/19 0513)     LOS: 55 days    Time spent: 25 mins.More than 50% of that time was spent in counseling and/or coordination of care.      Burnadette Pop, MD [facility] P3/04/2020, 7:46 AM

## 2019-11-23 NOTE — Progress Notes (Signed)
Pharmacy Antibiotic Note  Colleen Ramirez is a 39 y.o. female admitted on 09/29/2019 with MRSA cervical epidural abscess/osteo.  Pharmacy consulted for Vancomycin dosing.  ID: Abx for MRSA cervical epidural abscess/osteo now s/p surgical decompression, discectomy and fusion 1/12. Echo negative for vegetations. 6 weeks of abx. No PICC line due to PSA. Patient to remain in hospital for IV abx (through 12/11/19). Vanc peak 30, Vanc trough 12  AUC 482.5  Plan: -Continue vancomycin at 1250 mg IV q12h -Re-check levels as needed, weekly. - IV abx stop date 3/26   Thanks for allowing pharmacy to be a part of this patient's care.  Talbert Cage, PharmD Clinical Pharmacist

## 2019-11-24 NOTE — Plan of Care (Signed)
  Problem: Clinical Measurements: Goal: Will remain free from infection Outcome: Progressing Goal: Diagnostic test results will improve Outcome: Progressing   Problem: Nutrition: Goal: Adequate nutrition will be maintained Outcome: Progressing   Problem: Pain Managment: Goal: General experience of comfort will improve Outcome: Progressing   Problem: Safety: Goal: Ability to remain free from injury will improve Outcome: Progressing   Problem: Skin Integrity: Goal: Risk for impaired skin integrity will decrease Outcome: Progressing   

## 2019-11-24 NOTE — Plan of Care (Signed)
Plan of care reviewed with pt at bedside. VSS, pain controlled with medications per orders. PICC in place. Pt up adlib. Continue antibiotics as ordered. Pt stable at this time, will continue to monitor.  Problem: Clinical Measurements: Goal: Will remain free from infection Outcome: Progressing Goal: Diagnostic test results will improve Outcome: Progressing Goal: Respiratory complications will improve Outcome: Progressing Goal: Cardiovascular complication will be avoided Outcome: Progressing   Problem: Nutrition: Goal: Adequate nutrition will be maintained Outcome: Progressing   Problem: Coping: Goal: Level of anxiety will decrease Outcome: Progressing   Problem: Elimination: Goal: Will not experience complications related to bowel motility Outcome: Progressing Goal: Will not experience complications related to urinary retention Outcome: Progressing   Problem: Pain Managment: Goal: General experience of comfort will improve Outcome: Progressing   Problem: Safety: Goal: Ability to remain free from injury will improve Outcome: Progressing   Problem: Skin Integrity: Goal: Risk for impaired skin integrity will decrease Outcome: Progressing

## 2019-11-24 NOTE — Progress Notes (Signed)
PROGRESS NOTE    Colleen Ramirez  ZYY:[id number] DOB: 05/10/1981 DOA: 09/29/2019 PCP: Patient, No Pcp Per   Brief Narrative:  Patient is a 39 year old female with history of IV drug abuse, anxiety, depression, anemia, status post gastric bypass who presented to the emergency department on 1/12 with complaints of 2-day history of sharp  neck pain.  She was recently hospitalized from 12/20-2/24 for facial cellulitis.  MRI cervical spine showed discitis, osteomyelitis at C5-C6 and C6-7.  Found to have prevertebral/retropharyngeal soft tissue phlegmonous inflammation with areas of nonenhancing material consistent with pus.  Underwent C5-C6 anterior discectomy, instrumented fusion cultures positive for MRSA.  Started on vancomycin, rifampin.  Follow-up cervical MRI showed progression of findings consistent with discitis/osteomyelitis involving C4, C5, C6 and C7 vertebrae.  Neurosurgery and ID recommended to continue aggressive medical therapy.  Current plan is to continue IV antibiotics till March 26.    Assessment & Plan:   Principal Problem:   Abscess in epidural space of cervical spine Active Problems:   Depression   Acquired fusion of cervical spine   IVDU (intravenous drug user)   Homeless   S/P cervical discectomy   Anxiety   Status post gastric bypass for obesity   Hepatitis C antibody positive in blood   Acute bilateral thoracic back pain   Body aches   Methamphetamine abuse (HCC)   Pain of right sternoclavicular joint   Cervical spine epidural abscess with vertebral osteomyelitis: Status post C4-C7 fusion.  Plan is to continue IV antibiotics till March 26. Neurosurgery and ID were following.  History of IV drug abuse/hepatitis C: Needs follow up at pain management clinic / drug rehabilitation .  Needs to follow-up as an outpatient for hepatitis C management.  We'll try to taper pain medications.  Bilateral upper extremity DVT: Ultrasound showed right upper extremity superficial  and deep venous thrombosis of cephalic and brachial veins and right superficial vein thrombosis of basilic and  cephalic vein.  On Xarelto  Depression/anxiety/drug abuse: On Adderall, aripiprazole, trazodone, bupropion.  Chronic normocytic anemia: Currently hemoglobin stable in the range of 8.  Iron studies showed iron deficiency.  Started on oral iron replacement.  Nutrition Problem: Increased nutrient needs Etiology: post-op healing      DVT prophylaxis: Xarelto Code Status: Full Family Communication: None present at the bedside Disposition Plan: Patient is from home.  Needs to continue IV antibiotics till March 26.  Expected discharge to home after completion of IV antibiotics.   Consultants: ID, neurosurgery  Procedures: As above  Antimicrobials:  Anti-infectives (From admission, onward)   Start     Dose/Rate Route Frequency Ordered Stop   11/10/19 1200  vancomycin (VANCOREADY) IVPB 1250 mg/250 mL     1,250 mg 166.7 mL/hr over 90 Minutes Intravenous Every 12 hours 11/10/19 0627     11/09/19 2200  rifabutin (MYCOBUTIN) capsule 300 mg  Status:  Discontinued     300 mg Oral Every 12 hours 11/09/19 1136 11/09/19 1646   11/09/19 2200  rifabutin (MYCOBUTIN) capsule 300 mg     300 mg Oral Daily 11/09/19 1646     11/05/19 1600  vancomycin (VANCOCIN) IVPB 1000 mg/200 mL premix  Status:  Discontinued     1,000 mg 200 mL/hr over 60 Minutes Intravenous Every 12 hours 11/05/19 1504 11/10/19 0627   11/01/19 1400  vancomycin (VANCOREADY) IVPB 1500 mg/300 mL  Status:  Discontinued     1,500 mg 150 mL/hr over 120 Minutes Intravenous Every 12 hours 11/01/19 1305 11/05/19 4888  10/28/19 1000  vancomycin (VANCOREADY) IVPB 1500 mg/300 mL  Status:  Discontinued     1,500 mg 150 mL/hr over 120 Minutes Intravenous Every 12 hours 10/27/19 2353 11/01/19 1305   10/14/19 1000  vancomycin (VANCOREADY) IVPB 1250 mg/250 mL  Status:  Discontinued     1,250 mg 166.7 mL/hr over 90 Minutes Intravenous  Every 12 hours 10/14/19 0540 10/27/19 2353   10/13/19 0945  Vancomycin (VANCOCIN) 1,250 mg in sodium chloride 0.9 % 250 mL IVPB  Status:  Discontinued     1,250 mg 166.7 mL/hr over 90 Minutes Intravenous Every 12 hours 10/13/19 0935 10/14/19 0540   10/13/19 0930  Vancomycin (VANCOCIN) 1,250 mg in sodium chloride 0.9 % 250 mL IVPB  Status:  Discontinued     1,250 mg 166.7 mL/hr over 90 Minutes Intravenous Every 12 hours 10/13/19 0902 10/13/19 0934   10/09/19 2000  vancomycin (VANCOCIN) IVPB 1000 mg/200 mL premix  Status:  Discontinued     1,000 mg 200 mL/hr over 60 Minutes Intravenous Every 12 hours 10/09/19 1121 10/13/19 0902   10/04/19 2200  vancomycin (VANCOCIN) IVPB 750 mg/150 ml premix  Status:  Discontinued     750 mg 150 mL/hr over 60 Minutes Intravenous Every 8 hours 10/04/19 1305 10/09/19 1121   10/04/19 1400  vancomycin (VANCOCIN) IVPB 1000 mg/200 mL premix     1,000 mg 200 mL/hr over 60 Minutes Intravenous STAT 10/04/19 1305 10/04/19 1604   10/01/19 1100  rifampin (RIFADIN) capsule 300 mg  Status:  Discontinued     300 mg Oral Every 12 hours 10/01/19 1056 11/09/19 1136   09/30/19 1800  Vancomycin (VANCOCIN) 1,250 mg in sodium chloride 0.9 % 250 mL IVPB  Status:  Discontinued     1,250 mg 166.7 mL/hr over 90 Minutes Intravenous Every 24 hours 09/30/19 0003 09/30/19 0747   09/30/19 1800  vancomycin (VANCOREADY) IVPB 1250 mg/250 mL  Status:  Discontinued     1,250 mg 166.7 mL/hr over 90 Minutes Intravenous Every 24 hours 09/30/19 0746 10/04/19 1305   09/30/19 0400  piperacillin-tazobactam (ZOSYN) IVPB 3.375 g  Status:  Discontinued     3.375 g 12.5 mL/hr over 240 Minutes Intravenous Every 8 hours 09/30/19 0003 09/30/19 1533   09/29/19 2145  piperacillin-tazobactam (ZOSYN) IVPB 3.375 g     3.375 g 12.5 mL/hr over 240 Minutes Intravenous STAT 09/29/19 2137 09/29/19 2148   09/29/19 2119  bacitracin 50,000 Units in sodium chloride 0.9 % 500 mL irrigation  Status:  Discontinued        As needed 09/29/19 2119 09/29/19 2249   09/29/19 2033  vancomycin (VANCOCIN) 1-5 GM/200ML-% IVPB    Note to Pharmacy: Toney Sang   : cabinet override      09/29/19 2033 09/30/19 0844      Subjective: Patient seen at the bedside this morning.  Hemodynamically stable.  No active issues  Objective: Vitals:   11/23/19 0500 11/23/19 1550 11/24/19 0343 11/24/19 0836  BP: 95/64 107/76 90/67 99/74   Pulse: 99 (!) 102 97 99  Resp: 18 18 17 18   Temp: 98.5 F (36.9 C) (!) 97.5 F (36.4 C) 98.3 F (36.8 C) 98.5 F (36.9 C)  TempSrc: Oral Oral Oral Oral  SpO2: 100% 100% 98% 100%  Weight:      Height:        Intake/Output Summary (Last 24 hours) at 11/24/2019 1055 Last data filed at 11/24/2019 0900 Gross per 24 hour  Intake 923 ml  Output --  Net 923 ml  Filed Weights   09/29/19 2032  Weight: 49.9 kg    Examination:  General exam: Appears calm and comfortable  Respiratory system:  no wheezes or crackles  Gastrointestinal system: Abdomen is nondistended, soft and nontender.  Central nervous system: Alert and oriented. Extremities: No edema, no clubbing ,no cyanosis Skin: No rashes, lesions or ulcers,no icterus ,no pallor Clean surgical scar on her neck   Data Reviewed: I have personally reviewed following labs and imaging studies  CBC: Recent Labs  Lab 11/19/19 0428 11/21/19 0412  WBC 7.0 5.4  NEUTROABS 5.0 2.9  HGB 8.6* 9.6*  HCT 27.7* 31.1*  MCV 86.3 87.4  PLT 414* 678*   Basic Metabolic Panel: Recent Labs  Lab 11/21/19 0412  NA 139  K 3.7  CL 104  CO2 24  GLUCOSE 165*  BUN 8  CREATININE 0.60  CALCIUM 9.4   GFR: Estimated Creatinine Clearance: 74.4 mL/min (by C-G formula based on SCr of 0.6 mg/dL). Liver Function Tests: No results for input(s): AST, ALT, ALKPHOS, BILITOT, PROT, ALBUMIN in the last 168 hours. No results for input(s): LIPASE, AMYLASE in the last 168 hours. No results for input(s): AMMONIA in the last 168 hours. Coagulation  Profile: No results for input(s): INR, PROTIME in the last 168 hours. Cardiac Enzymes: No results for input(s): CKTOTAL, CKMB, CKMBINDEX, TROPONINI in the last 168 hours. BNP (last 3 results) No results for input(s): PROBNP in the last 8760 hours. HbA1C: No results for input(s): HGBA1C in the last 72 hours. CBG: No results for input(s): GLUCAP in the last 168 hours. Lipid Profile: No results for input(s): CHOL, HDL, LDLCALC, TRIG, CHOLHDL, LDLDIRECT in the last 72 hours. Thyroid Function Tests: No results for input(s): TSH, T4TOTAL, FREET4, T3FREE, THYROIDAB in the last 72 hours. Anemia Panel: No results for input(s): VITAMINB12, FOLATE, FERRITIN, TIBC, IRON, RETICCTPCT in the last 72 hours. Sepsis Labs: No results for input(s): PROCALCITON, LATICACIDVEN in the last 168 hours.  Recent Results (from the past 240 hour(s))  Culture, blood (routine x 2)     Status: None   Collection Time: 11/14/19  3:25 PM   Specimen: BLOOD  Result Value Ref Range Status   Specimen Description BLOOD LEFT ANTECUBITAL  Final   Special Requests   Final    BOTTLES DRAWN AEROBIC AND ANAEROBIC Blood Culture results may not be optimal due to an inadequate volume of blood received in culture bottles   Culture   Final    NO GROWTH 5 DAYS Performed at [facility], [place] [address] [place], [place] [address]    Report Status 11/19/2019 FINAL  Final  Culture, blood (routine x 2)     Status: None   Collection Time: 11/14/19  3:30 PM   Specimen: BLOOD LEFT HAND  Result Value Ref Range Status   Specimen Description BLOOD LEFT HAND  Final   Special Requests   Final    BOTTLES DRAWN AEROBIC AND ANAEROBIC Blood Culture results may not be optimal due to an inadequate volume of blood received in culture bottles   Culture   Final    NO GROWTH 5 DAYS Performed at [facility], [place] [address], [place], [place] [address]    Report Status 11/19/2019 FINAL  Final         Radiology Studies: No results  found.      Scheduled Meds: . acetaminophen  1,000 mg Oral TID  . amphetamine-dextroamphetamine  30 mg Oral BID  . ARIPiprazole  20 mg  Oral Daily  . buPROPion  150 mg Oral Daily  . Chlorhexidine Gluconate Cloth  6 each Topical Q0600  . diclofenac Sodium  4 g Topical QID  . feeding supplement (ENSURE ENLIVE)  237 mL Oral BID BM  . ferrous sulfate  325 mg Oral Q breakfast  . gabapentin  100 mg Oral TID  . influenza vac split quadrivalent PF  0.5 mL Intramuscular Tomorrow-1000  . multivitamin with minerals  1 tablet Oral Daily  . nicotine  14 mg Transdermal Daily  . rifabutin  300 mg Oral Daily  . rivaroxaban  20 mg Oral Daily  . senna-docusate  1 tablet Oral BID  . sodium chloride flush  10-40 mL Intracatheter Q12H  . sodium chloride flush  3 mL Intravenous Q12H   Continuous Infusions: . sodium chloride 250 mL (11/17/19 1800)  . sodium chloride Stopped (11/18/19 1950)  . vancomycin 1,250 mg (11/24/19 0546)     LOS: 56 days    Time spent: 15 mins.More than 50% of that time was spent in counseling and/or coordination of care.      Burnadette Pop, MD [facility] P3/05/2020, 10:55 AM

## 2019-11-25 NOTE — Progress Notes (Signed)
PROGRESS NOTE    Colleen Ramirez  IDP:[id number] DOB: [birth year] DOA: 09/29/2019 PCP: Patient, No Pcp Per   Brief Narrative:  Patient is a 39 year old female with history of IV drug abuse, anxiety, depression, anemia, status post gastric bypass who presented to the emergency department on 1/12 with complaints of 2-day history of sharp  neck pain.  She was recently hospitalized from 12/20-2/24 for facial cellulitis.  MRI cervical spine showed discitis, osteomyelitis at C5-C6 and C6-7.  Found to have prevertebral/retropharyngeal soft tissue phlegmonous inflammation with areas of nonenhancing material consistent with pus.  Underwent C5-C6 anterior discectomy, instrumented fusion cultures positive for MRSA.  Started on vancomycin, rifampin.  Follow-up cervical MRI showed progression of findings consistent with discitis/osteomyelitis involving C4, C5, C6 and C7 vertebrae.  Neurosurgery and ID recommended to continue aggressive medical therapy.  Current plan is to continue IV antibiotics till March 26.    Assessment & Plan:   Principal Problem:   Abscess in epidural space of cervical spine Active Problems:   Depression   Acquired fusion of cervical spine   IVDU (intravenous drug user)   Homeless   S/P cervical discectomy   Anxiety   Status post gastric bypass for obesity   Hepatitis C antibody positive in blood   Acute bilateral thoracic back pain   Body aches   Methamphetamine abuse (HCC)   Pain of right sternoclavicular joint   Cervical spine epidural abscess with vertebral osteomyelitis: Status post C4-C7 fusion.  Plan is to continue IV antibiotics till March 26. Neurosurgery and ID were following.  History of IV drug abuse/hepatitis C: Needs follow up at pain management clinic / drug rehabilitation .  Needs to follow-up as an outpatient for hepatitis C management.  We'll try to taper pain medications.  Bilateral upper extremity DVT: Ultrasound showed right upper extremity superficial  and deep venous thrombosis of cephalic and brachial veins and right superficial vein thrombosis of basilic and  cephalic vein.  On Xarelto  Depression/anxiety/drug abuse: On Adderall, aripiprazole, trazodone, bupropion.  Chronic normocytic anemia: Currently hemoglobin stable in the range of 8.  Iron studies showed iron deficiency.  Started on oral iron replacement.  Nutrition Problem: Increased nutrient needs Etiology: post-op healing      DVT prophylaxis: Xarelto Code Status: Full Family Communication: None present at the bedside Disposition Plan: Patient is from home.  Needs to continue IV antibiotics till March 26.  Expected discharge to home after completion of IV antibiotics.   Consultants: ID, neurosurgery  Procedures: As above  Antimicrobials:  Anti-infectives (From admission, onward)   Start     Dose/Rate Route Frequency Ordered Stop   11/10/19 1200  vancomycin (VANCOREADY) IVPB 1250 mg/250 mL     1,250 mg 166.7 mL/hr over 90 Minutes Intravenous Every 12 hours 11/10/19 0627     11/09/19 2200  rifabutin (MYCOBUTIN) capsule 300 mg  Status:  Discontinued     300 mg Oral Every 12 hours 11/09/19 1136 11/09/19 1646   11/09/19 2200  rifabutin (MYCOBUTIN) capsule 300 mg     300 mg Oral Daily 11/09/19 1646     11/05/19 1600  vancomycin (VANCOCIN) IVPB 1000 mg/200 mL premix  Status:  Discontinued     1,000 mg 200 mL/hr over 60 Minutes Intravenous Every 12 hours 11/05/19 1504 11/10/19 0627   11/01/19 1400  vancomycin (VANCOREADY) IVPB 1500 mg/300 mL  Status:  Discontinued     1,500 mg 150 mL/hr over 120 Minutes Intravenous Every 12 hours 11/01/19 1305 11/05/19 4431  10/28/19 1000  vancomycin (VANCOREADY) IVPB 1500 mg/300 mL  Status:  Discontinued     1,500 mg 150 mL/hr over 120 Minutes Intravenous Every 12 hours 10/27/19 2353 11/01/19 1305   10/14/19 1000  vancomycin (VANCOREADY) IVPB 1250 mg/250 mL  Status:  Discontinued     1,250 mg 166.7 mL/hr over 90 Minutes Intravenous  Every 12 hours 10/14/19 0540 10/27/19 2353   10/13/19 0945  Vancomycin (VANCOCIN) 1,250 mg in sodium chloride 0.9 % 250 mL IVPB  Status:  Discontinued     1,250 mg 166.7 mL/hr over 90 Minutes Intravenous Every 12 hours 10/13/19 0935 10/14/19 0540   10/13/19 0930  Vancomycin (VANCOCIN) 1,250 mg in sodium chloride 0.9 % 250 mL IVPB  Status:  Discontinued     1,250 mg 166.7 mL/hr over 90 Minutes Intravenous Every 12 hours 10/13/19 0902 10/13/19 0934   10/09/19 2000  vancomycin (VANCOCIN) IVPB 1000 mg/200 mL premix  Status:  Discontinued     1,000 mg 200 mL/hr over 60 Minutes Intravenous Every 12 hours 10/09/19 1121 10/13/19 0902   10/04/19 2200  vancomycin (VANCOCIN) IVPB 750 mg/150 ml premix  Status:  Discontinued     750 mg 150 mL/hr over 60 Minutes Intravenous Every 8 hours 10/04/19 1305 10/09/19 1121   10/04/19 1400  vancomycin (VANCOCIN) IVPB 1000 mg/200 mL premix     1,000 mg 200 mL/hr over 60 Minutes Intravenous STAT 10/04/19 1305 10/04/19 1604   10/01/19 1100  rifampin (RIFADIN) capsule 300 mg  Status:  Discontinued     300 mg Oral Every 12 hours 10/01/19 1056 11/09/19 1136   09/30/19 1800  Vancomycin (VANCOCIN) 1,250 mg in sodium chloride 0.9 % 250 mL IVPB  Status:  Discontinued     1,250 mg 166.7 mL/hr over 90 Minutes Intravenous Every 24 hours 09/30/19 0003 09/30/19 0747   09/30/19 1800  vancomycin (VANCOREADY) IVPB 1250 mg/250 mL  Status:  Discontinued     1,250 mg 166.7 mL/hr over 90 Minutes Intravenous Every 24 hours 09/30/19 0746 10/04/19 1305   09/30/19 0400  piperacillin-tazobactam (ZOSYN) IVPB 3.375 g  Status:  Discontinued     3.375 g 12.5 mL/hr over 240 Minutes Intravenous Every 8 hours 09/30/19 0003 09/30/19 1533   09/29/19 2145  piperacillin-tazobactam (ZOSYN) IVPB 3.375 g     3.375 g 12.5 mL/hr over 240 Minutes Intravenous STAT 09/29/19 2137 09/29/19 2148   09/29/19 2119  bacitracin 50,000 Units in sodium chloride 0.9 % 500 mL irrigation  Status:  Discontinued        As needed 09/29/19 2119 09/29/19 2249   09/29/19 2033  vancomycin (VANCOCIN) 1-5 GM/200ML-% IVPB    Note to Pharmacy: Ubaldo Glassing   : cabinet override      09/29/19 2033 09/30/19 0844      Subjective: Patient seen at the bedside this morning.  Hemodynamically stable.  No new changes.  Comfortable Objective: Vitals:   11/24/19 1946 11/25/19 0418 11/25/19 0448 11/25/19 0734  BP: 103/78 114/65 94/66 103/80  Pulse: 98 97 96 98  Resp: 17 17 17 18   Temp: 98.1 F (36.7 C) 98.6 F (37 C) 98.4 F (36.9 C) 99.4 F (37.4 C)  TempSrc: Oral Oral Oral Oral  SpO2: 100% 98% 100% 100%  Weight:      Height:        Intake/Output Summary (Last 24 hours) at 11/25/2019 1254 Last data filed at 11/25/2019 0900 Gross per 24 hour  Intake 1140 ml  Output --  Net 1140 ml  Filed Weights   09/29/19 2032  Weight: 49.9 kg    Examination:  General exam: Comfortable In no distress.   Data Reviewed: I have personally reviewed following labs and imaging studies  CBC: Recent Labs  Lab 11/19/19 0428 11/21/19 0412  WBC 7.0 5.4  NEUTROABS 5.0 2.9  HGB 8.6* 9.6*  HCT 27.7* 31.1*  MCV 86.3 87.4  PLT 414* 432*   Basic Metabolic Panel: Recent Labs  Lab 11/21/19 0412  NA 139  K 3.7  CL 104  CO2 24  GLUCOSE 165*  BUN 8  CREATININE 0.60  CALCIUM 9.4   GFR: Estimated Creatinine Clearance: 74.4 mL/min (by C-G formula based on SCr of 0.6 mg/dL). Liver Function Tests: No results for input(s): AST, ALT, ALKPHOS, BILITOT, PROT, ALBUMIN in the last 168 hours. No results for input(s): LIPASE, AMYLASE in the last 168 hours. No results for input(s): AMMONIA in the last 168 hours. Coagulation Profile: No results for input(s): INR, PROTIME in the last 168 hours. Cardiac Enzymes: No results for input(s): CKTOTAL, CKMB, CKMBINDEX, TROPONINI in the last 168 hours. BNP (last 3 results) No results for input(s): PROBNP in the last 8760 hours. HbA1C: No results for input(s): HGBA1C in the last  72 hours. CBG: No results for input(s): GLUCAP in the last 168 hours. Lipid Profile: No results for input(s): CHOL, HDL, LDLCALC, TRIG, CHOLHDL, LDLDIRECT in the last 72 hours. Thyroid Function Tests: No results for input(s): TSH, T4TOTAL, FREET4, T3FREE, THYROIDAB in the last 72 hours. Anemia Panel: No results for input(s): VITAMINB12, FOLATE, FERRITIN, TIBC, IRON, RETICCTPCT in the last 72 hours. Sepsis Labs: No results for input(s): PROCALCITON, LATICACIDVEN in the last 168 hours.  No results found for this or any previous visit (from the past 240 hour(s)).       Radiology Studies: No results found.      Scheduled Meds: . acetaminophen  1,000 mg Oral TID  . amphetamine-dextroamphetamine  30 mg Oral BID  . ARIPiprazole  20 mg Oral Daily  . buPROPion  150 mg Oral Daily  . Chlorhexidine Gluconate Cloth  6 each Topical Q0600  . diclofenac Sodium  4 g Topical QID  . feeding supplement (ENSURE ENLIVE)  237 mL Oral BID BM  . ferrous sulfate  325 mg Oral Q breakfast  . gabapentin  100 mg Oral TID  . influenza vac split quadrivalent PF  0.5 mL Intramuscular Tomorrow-1000  . multivitamin with minerals  1 tablet Oral Daily  . nicotine  14 mg Transdermal Daily  . rifabutin  300 mg Oral Daily  . rivaroxaban  20 mg Oral Daily  . senna-docusate  1 tablet Oral BID  . sodium chloride flush  10-40 mL Intracatheter Q12H  . sodium chloride flush  3 mL Intravenous Q12H   Continuous Infusions: . sodium chloride 250 mL (11/17/19 1800)  . sodium chloride Stopped (11/18/19 1950)  . vancomycin 1,250 mg (11/25/19 0821)     LOS: 57 days    Time spent: 15 mins.More than 50% of that time was spent in counseling and/or coordination of care.      Burnadette Pop, MD [facility] P3/06/2020, 12:54 PM

## 2019-11-26 LAB — CBC WITH DIFFERENTIAL/PLATELET
Abs Immature Granulocytes: 0.03 10*3/uL (ref 0.00–0.07)
Basophils Absolute: 0 10*3/uL (ref 0.0–0.1)
Basophils Relative: 1 %
Eosinophils Absolute: 0.2 10*3/uL (ref 0.0–0.5)
Eosinophils Relative: 3 %
HCT: 27.2 % — ABNORMAL LOW (ref 36.0–46.0)
Hemoglobin: 8.5 g/dL — ABNORMAL LOW (ref 12.0–15.0)
Immature Granulocytes: 1 %
Lymphocytes Relative: 19 %
Lymphs Abs: 1.2 10*3/uL (ref 0.7–4.0)
MCH: 27 pg (ref 26.0–34.0)
MCHC: 31.3 g/dL (ref 30.0–36.0)
MCV: 86.3 fL (ref 80.0–100.0)
Monocytes Absolute: 0.5 10*3/uL (ref 0.1–1.0)
Monocytes Relative: 8 %
Neutro Abs: 4.3 10*3/uL (ref 1.7–7.7)
Neutrophils Relative %: 68 %
Platelets: 312 10*3/uL (ref 150–400)
RBC: 3.15 MIL/uL — ABNORMAL LOW (ref 3.87–5.11)
RDW: 13.6 % (ref 11.5–15.5)
WBC: 6.3 10*3/uL (ref 4.0–10.5)
nRBC: 0 % (ref 0.0–0.2)

## 2019-11-26 LAB — C-REACTIVE PROTEIN: CRP: 3.2 mg/dL — ABNORMAL HIGH (ref <1.0)

## 2019-11-26 NOTE — Plan of Care (Signed)
  Problem: Clinical Measurements: Goal: Will remain free from infection Outcome: Progressing  Patient afebrile this morning.  Problem: Clinical Measurements: Goal: Respiratory complications will improve Outcome: Progressing  Respiratory rate within normal limits, patient denies SOB.  Problem: Pain Managment: Goal: General experience of comfort will improve Outcome: Progressing   Patient states pain is adequately controlled by PRN meds.

## 2019-11-26 NOTE — Progress Notes (Addendum)
PROGRESS NOTE    Colleen Ramirez  IDP:[id number] DOB: [birth year] DOA: 09/29/2019 PCP: Patient, No Pcp Per   Brief Narrative:  Patient is a 39 year old female with history of IV drug abuse, anxiety, depression, anemia, status post gastric bypass who presented to the emergency department on 1/12 with complaints of 2-day history of sharp  neck pain.  She was recently hospitalized from 12/20-2/24 for facial cellulitis.  MRI cervical spine showed discitis, osteomyelitis at C5-C6 and C6-7.  Found to have prevertebral/retropharyngeal soft tissue phlegmonous inflammation with areas of nonenhancing material consistent with pus.  Underwent C5-C6 anterior discectomy, instrumented fusion cultures positive for MRSA.  Started on vancomycin, rifampin.  Follow-up cervical MRI showed progression of findings consistent with discitis/osteomyelitis involving C4, C5, C6 and C7 vertebrae.  Neurosurgery and ID recommended to continue aggressive medical therapy.  Current plan is to continue IV antibiotics till March 26.    Assessment & Plan:   Principal Problem:   Abscess in epidural space of cervical spine Active Problems:   Depression   Acquired fusion of cervical spine   IVDU (intravenous drug user)   Homeless   S/P cervical discectomy   Anxiety   Status post gastric bypass for obesity   Hepatitis C antibody positive in blood   Acute bilateral thoracic back pain   Body aches   Methamphetamine abuse (HCC)   Pain of right sternoclavicular joint   Cervical spine epidural abscess with vertebral osteomyelitis: Status post C4-C7 fusion.  Plan is to continue IV antibiotics till March 26. Neurosurgery and ID were following.  History of IV drug abuse/hepatitis C: Needs follow up at pain management clinic / drug rehabilitation .  Needs to follow-up as an outpatient for hepatitis C management.  We'll try to taper pain medications.  Bilateral upper extremity DVT: Ultrasound showed right upper extremity superficial  and deep venous thrombosis of cephalic and brachial veins and right superficial vein thrombosis of basilic and  cephalic vein.  On Xarelto  Depression/anxiety/drug abuse: On Adderall, aripiprazole, trazodone, bupropion.  Chronic normocytic anemia: Currently hemoglobin stable in the range of 8.  Iron studies showed iron deficiency.  Started on oral iron replacement.  Nutrition Problem: Increased nutrient needs Etiology: post-op healing      DVT prophylaxis: Xarelto Code Status: Full Family Communication: None present at the bedside Disposition Plan: Patient is from home.  Needs to continue IV antibiotics till March 26.  Expected discharge to home after completion of IV antibiotics.   Consultants: ID, neurosurgery  Procedures: As above  Antimicrobials:  Anti-infectives (From admission, onward)   Start     Dose/Rate Route Frequency Ordered Stop   11/10/19 1200  vancomycin (VANCOREADY) IVPB 1250 mg/250 mL     1,250 mg 166.7 mL/hr over 90 Minutes Intravenous Every 12 hours 11/10/19 0627     11/09/19 2200  rifabutin (MYCOBUTIN) capsule 300 mg  Status:  Discontinued     300 mg Oral Every 12 hours 11/09/19 1136 11/09/19 1646   11/09/19 2200  rifabutin (MYCOBUTIN) capsule 300 mg     300 mg Oral Daily 11/09/19 1646     11/05/19 1600  vancomycin (VANCOCIN) IVPB 1000 mg/200 mL premix  Status:  Discontinued     1,000 mg 200 mL/hr over 60 Minutes Intravenous Every 12 hours 11/05/19 1504 11/10/19 0627   11/01/19 1400  vancomycin (VANCOREADY) IVPB 1500 mg/300 mL  Status:  Discontinued     1,500 mg 150 mL/hr over 120 Minutes Intravenous Every 12 hours 11/01/19 1305 11/05/19 4431  10/28/19 1000  vancomycin (VANCOREADY) IVPB 1500 mg/300 mL  Status:  Discontinued     1,500 mg 150 mL/hr over 120 Minutes Intravenous Every 12 hours 10/27/19 2353 11/01/19 1305   10/14/19 1000  vancomycin (VANCOREADY) IVPB 1250 mg/250 mL  Status:  Discontinued     1,250 mg 166.7 mL/hr over 90 Minutes Intravenous  Every 12 hours 10/14/19 0540 10/27/19 2353   10/13/19 0945  Vancomycin (VANCOCIN) 1,250 mg in sodium chloride 0.9 % 250 mL IVPB  Status:  Discontinued     1,250 mg 166.7 mL/hr over 90 Minutes Intravenous Every 12 hours 10/13/19 0935 10/14/19 0540   10/13/19 0930  Vancomycin (VANCOCIN) 1,250 mg in sodium chloride 0.9 % 250 mL IVPB  Status:  Discontinued     1,250 mg 166.7 mL/hr over 90 Minutes Intravenous Every 12 hours 10/13/19 0902 10/13/19 0934   10/09/19 2000  vancomycin (VANCOCIN) IVPB 1000 mg/200 mL premix  Status:  Discontinued     1,000 mg 200 mL/hr over 60 Minutes Intravenous Every 12 hours 10/09/19 1121 10/13/19 0902   10/04/19 2200  vancomycin (VANCOCIN) IVPB 750 mg/150 ml premix  Status:  Discontinued     750 mg 150 mL/hr over 60 Minutes Intravenous Every 8 hours 10/04/19 1305 10/09/19 1121   10/04/19 1400  vancomycin (VANCOCIN) IVPB 1000 mg/200 mL premix     1,000 mg 200 mL/hr over 60 Minutes Intravenous STAT 10/04/19 1305 10/04/19 1604   10/01/19 1100  rifampin (RIFADIN) capsule 300 mg  Status:  Discontinued     300 mg Oral Every 12 hours 10/01/19 1056 11/09/19 1136   09/30/19 1800  Vancomycin (VANCOCIN) 1,250 mg in sodium chloride 0.9 % 250 mL IVPB  Status:  Discontinued     1,250 mg 166.7 mL/hr over 90 Minutes Intravenous Every 24 hours 09/30/19 0003 09/30/19 0747   09/30/19 1800  vancomycin (VANCOREADY) IVPB 1250 mg/250 mL  Status:  Discontinued     1,250 mg 166.7 mL/hr over 90 Minutes Intravenous Every 24 hours 09/30/19 0746 10/04/19 1305   09/30/19 0400  piperacillin-tazobactam (ZOSYN) IVPB 3.375 g  Status:  Discontinued     3.375 g 12.5 mL/hr over 240 Minutes Intravenous Every 8 hours 09/30/19 0003 09/30/19 1533   09/29/19 2145  piperacillin-tazobactam (ZOSYN) IVPB 3.375 g     3.375 g 12.5 mL/hr over 240 Minutes Intravenous STAT 09/29/19 2137 09/29/19 2148   09/29/19 2119  bacitracin 50,000 Units in sodium chloride 0.9 % 500 mL irrigation  Status:  Discontinued        As needed 09/29/19 2119 09/29/19 2249   09/29/19 2033  vancomycin (VANCOCIN) 1-5 GM/200ML-% IVPB    Note to Pharmacy: Toney Sang   : cabinet override      09/29/19 2033 09/30/19 0844      Subjective: Patient seen and examined the bedside this morning.  Hemodynamically stable.  No active issues. Objective: Vitals:   11/25/19 1538 11/25/19 1936 11/26/19 0314 11/26/19 0806  BP: 117/85 93/62 92/66  91/61  Pulse: 98 99 96 98  Resp: 17 17 17 18   Temp: 98.5 F (36.9 C) 97.8 F (36.6 C) 98.6 F (37 C) 98 F (36.7 C)  TempSrc: Oral Oral Oral Oral  SpO2: 100% 100% 99% 100%  Weight:      Height:        Intake/Output Summary (Last 24 hours) at 11/26/2019 1302 Last data filed at 11/26/2019 0900 Gross per 24 hour  Intake 1080 ml  Output --  Net 1080 ml  Filed Weights   09/29/19 2032  Weight: 49.9 kg    Examination:  General exam: Comfortable In no distress. CVS: Regular rate and rhythm Respiratory: Bilaterally clear Skin : no ulcers   Data Reviewed: I have personally reviewed following labs and imaging studies  CBC: Recent Labs  Lab 11/21/19 0412 11/26/19 0402  WBC 5.4 6.3  NEUTROABS 2.9 4.3  HGB 9.6* 8.5*  HCT 31.1* 27.2*  MCV 87.4 86.3  PLT 432* 604   Basic Metabolic Panel: Recent Labs  Lab 11/21/19 0412  NA 139  K 3.7  CL 104  CO2 24  GLUCOSE 165*  BUN 8  CREATININE 0.60  CALCIUM 9.4   GFR: Estimated Creatinine Clearance: 74.4 mL/min (by C-G formula based on SCr of 0.6 mg/dL). Liver Function Tests: No results for input(s): AST, ALT, ALKPHOS, BILITOT, PROT, ALBUMIN in the last 168 hours. No results for input(s): LIPASE, AMYLASE in the last 168 hours. No results for input(s): AMMONIA in the last 168 hours. Coagulation Profile: No results for input(s): INR, PROTIME in the last 168 hours. Cardiac Enzymes: No results for input(s): CKTOTAL, CKMB, CKMBINDEX, TROPONINI in the last 168 hours. BNP (last 3 results) No results for input(s): PROBNP in  the last 8760 hours. HbA1C: No results for input(s): HGBA1C in the last 72 hours. CBG: No results for input(s): GLUCAP in the last 168 hours. Lipid Profile: No results for input(s): CHOL, HDL, LDLCALC, TRIG, CHOLHDL, LDLDIRECT in the last 72 hours. Thyroid Function Tests: No results for input(s): TSH, T4TOTAL, FREET4, T3FREE, THYROIDAB in the last 72 hours. Anemia Panel: No results for input(s): VITAMINB12, FOLATE, FERRITIN, TIBC, IRON, RETICCTPCT in the last 72 hours. Sepsis Labs: No results for input(s): PROCALCITON, LATICACIDVEN in the last 168 hours.  No results found for this or any previous visit (from the past 240 hour(s)).       Radiology Studies: No results found.      Scheduled Meds: . acetaminophen  1,000 mg Oral TID  . amphetamine-dextroamphetamine  30 mg Oral BID  . ARIPiprazole  20 mg Oral Daily  . buPROPion  150 mg Oral Daily  . Chlorhexidine Gluconate Cloth  6 each Topical Q0600  . diclofenac Sodium  4 g Topical QID  . feeding supplement (ENSURE ENLIVE)  237 mL Oral BID BM  . ferrous sulfate  325 mg Oral Q breakfast  . gabapentin  100 mg Oral TID  . influenza vac split quadrivalent PF  0.5 mL Intramuscular Tomorrow-1000  . multivitamin with minerals  1 tablet Oral Daily  . nicotine  14 mg Transdermal Daily  . rifabutin  300 mg Oral Daily  . rivaroxaban  20 mg Oral Daily  . senna-docusate  1 tablet Oral BID  . sodium chloride flush  10-40 mL Intracatheter Q12H   Continuous Infusions: . sodium chloride Stopped (11/18/19 1950)  . vancomycin 1,250 mg (11/26/19 0525)     LOS: 58 days    Time spent: 15 mins.More than 50% of that time was spent in counseling and/or coordination of care.      Shelly Coss, MD [facility] P3/07/2020, 1:02 PM

## 2019-11-27 NOTE — Plan of Care (Signed)
  Problem: Clinical Measurements: Goal: Will remain free from infection Outcome: Progressing Goal: Diagnostic test results will improve Outcome: Progressing Goal: Respiratory complications will improve Outcome: Progressing Goal: Cardiovascular complication will be avoided Outcome: Progressing   Problem: Nutrition: Goal: Adequate nutrition will be maintained Outcome: Progressing   Problem: Coping: Goal: Level of anxiety will decrease Outcome: Progressing   Problem: Elimination: Goal: Will not experience complications related to bowel motility Outcome: Progressing Goal: Will not experience complications related to urinary retention Outcome: Progressing   Problem: Pain Managment: Goal: General experience of comfort will improve Outcome: Progressing   Problem: Safety: Goal: Ability to remain free from injury will improve Outcome: Progressing   Problem: Skin Integrity: Goal: Risk for impaired skin integrity will decrease Outcome: Progressing   

## 2019-11-27 NOTE — Progress Notes (Signed)
PROGRESS NOTE    Colleen Ramirez  IDP:[id number] DOB: [birth year] DOA: 09/29/2019 PCP: Colleen Ramirez, No Pcp Per   Brief Narrative:  Colleen Ramirez is a 39 year old female with history of IV drug abuse, anxiety, depression, anemia, status post gastric bypass who presented to the emergency department on 1/12 with complaints of 2-day history of sharp  neck pain.  She was recently hospitalized from 12/20-2/24 for facial cellulitis.  MRI cervical spine showed discitis, osteomyelitis at C5-C6 and C6-7.  Found to have prevertebral/retropharyngeal soft tissue phlegmonous inflammation with areas of nonenhancing material consistent with pus.  Underwent C5-C6 anterior discectomy, instrumented fusion cultures positive for MRSA.  Started on vancomycin, rifampin.  Follow-up cervical MRI showed progression of findings consistent with discitis/osteomyelitis involving C4, C5, C6 and C7 vertebrae.  Neurosurgery and ID recommended to continue aggressive medical therapy.  Current plan is to continue IV antibiotics till March 26.    Assessment & Plan:   Principal Problem:   Abscess in epidural space of cervical spine Active Problems:   Depression   Acquired fusion of cervical spine   IVDU (intravenous drug user)   Homeless   S/P cervical discectomy   Anxiety   Status post gastric bypass for obesity   Hepatitis C antibody positive in blood   Acute bilateral thoracic back pain   Body aches   Methamphetamine abuse (HCC)   Pain of right sternoclavicular joint   Cervical spine epidural abscess with vertebral osteomyelitis: Status post C4-C7 fusion.  Plan is to continue IV antibiotics till March 26. Neurosurgery and ID were following.  History of IV drug abuse/hepatitis C: Needs follow up at pain management clinic / drug rehabilitation .  Needs to follow-up as an outpatient for hepatitis C management.  We'll try to taper pain medications.  Bilateral upper extremity DVT: Ultrasound showed right upper extremity superficial  and deep venous thrombosis of cephalic and brachial veins and right superficial vein thrombosis of basilic and  cephalic vein.  On Xarelto  Depression/anxiety/drug abuse: On Adderall, aripiprazole, trazodone, bupropion.  Chronic normocytic anemia: Currently hemoglobin stable in the range of 8.  Iron studies showed iron deficiency.  Started on oral iron replacement.  Nutrition Problem: Increased nutrient needs Etiology: post-op healing      DVT prophylaxis: Xarelto Code Status: Full Family Communication: None present at the bedside Disposition Plan: Colleen Ramirez is from home.  Needs to continue IV antibiotics till March 26.  Expected discharge to home after completion of IV antibiotics.   Consultants: ID, neurosurgery  Procedures: As above  Antimicrobials:  Anti-infectives (From admission, onward)   Start     Dose/Rate Route Frequency Ordered Stop   11/10/19 1200  vancomycin (VANCOREADY) IVPB 1250 mg/250 mL     1,250 mg 166.7 mL/hr over 90 Minutes Intravenous Every 12 hours 11/10/19 0627     11/09/19 2200  rifabutin (MYCOBUTIN) capsule 300 mg  Status:  Discontinued     300 mg Oral Every 12 hours 11/09/19 1136 11/09/19 1646   11/09/19 2200  rifabutin (MYCOBUTIN) capsule 300 mg     300 mg Oral Daily 11/09/19 1646     11/05/19 1600  vancomycin (VANCOCIN) IVPB 1000 mg/200 mL premix  Status:  Discontinued     1,000 mg 200 mL/hr over 60 Minutes Intravenous Every 12 hours 11/05/19 1504 11/10/19 0627   11/01/19 1400  vancomycin (VANCOREADY) IVPB 1500 mg/300 mL  Status:  Discontinued     1,500 mg 150 mL/hr over 120 Minutes Intravenous Every 12 hours 11/01/19 1305 11/05/19 4431  10/28/19 1000  vancomycin (VANCOREADY) IVPB 1500 mg/300 mL  Status:  Discontinued     1,500 mg 150 mL/hr over 120 Minutes Intravenous Every 12 hours 10/27/19 2353 11/01/19 1305   10/14/19 1000  vancomycin (VANCOREADY) IVPB 1250 mg/250 mL  Status:  Discontinued     1,250 mg 166.7 mL/hr over 90 Minutes Intravenous  Every 12 hours 10/14/19 0540 10/27/19 2353   10/13/19 0945  Vancomycin (VANCOCIN) 1,250 mg in sodium chloride 0.9 % 250 mL IVPB  Status:  Discontinued     1,250 mg 166.7 mL/hr over 90 Minutes Intravenous Every 12 hours 10/13/19 0935 10/14/19 0540   10/13/19 0930  Vancomycin (VANCOCIN) 1,250 mg in sodium chloride 0.9 % 250 mL IVPB  Status:  Discontinued     1,250 mg 166.7 mL/hr over 90 Minutes Intravenous Every 12 hours 10/13/19 0902 10/13/19 0934   10/09/19 2000  vancomycin (VANCOCIN) IVPB 1000 mg/200 mL premix  Status:  Discontinued     1,000 mg 200 mL/hr over 60 Minutes Intravenous Every 12 hours 10/09/19 1121 10/13/19 0902   10/04/19 2200  vancomycin (VANCOCIN) IVPB 750 mg/150 ml premix  Status:  Discontinued     750 mg 150 mL/hr over 60 Minutes Intravenous Every 8 hours 10/04/19 1305 10/09/19 1121   10/04/19 1400  vancomycin (VANCOCIN) IVPB 1000 mg/200 mL premix     1,000 mg 200 mL/hr over 60 Minutes Intravenous STAT 10/04/19 1305 10/04/19 1604   10/01/19 1100  rifampin (RIFADIN) capsule 300 mg  Status:  Discontinued     300 mg Oral Every 12 hours 10/01/19 1056 11/09/19 1136   09/30/19 1800  Vancomycin (VANCOCIN) 1,250 mg in sodium chloride 0.9 % 250 mL IVPB  Status:  Discontinued     1,250 mg 166.7 mL/hr over 90 Minutes Intravenous Every 24 hours 09/30/19 0003 09/30/19 0747   09/30/19 1800  vancomycin (VANCOREADY) IVPB 1250 mg/250 mL  Status:  Discontinued     1,250 mg 166.7 mL/hr over 90 Minutes Intravenous Every 24 hours 09/30/19 0746 10/04/19 1305   09/30/19 0400  piperacillin-tazobactam (ZOSYN) IVPB 3.375 g  Status:  Discontinued     3.375 g 12.5 mL/hr over 240 Minutes Intravenous Every 8 hours 09/30/19 0003 09/30/19 1533   09/29/19 2145  piperacillin-tazobactam (ZOSYN) IVPB 3.375 g     3.375 g 12.5 mL/hr over 240 Minutes Intravenous STAT 09/29/19 2137 09/29/19 2148   09/29/19 2119  bacitracin 50,000 Units in sodium chloride 0.9 % 500 mL irrigation  Status:  Discontinued        As needed 09/29/19 2119 09/29/19 2249   09/29/19 2033  vancomycin (VANCOCIN) 1-5 GM/200ML-% IVPB    Note to Pharmacy: Toney Sang   : cabinet override      09/29/19 2033 09/30/19 0844      Subjective: Colleen Ramirez seen and examined the bedside this morning.  Hemodynamically stable.  Comfortable.  Talking on the phone no active issues   objective: Vitals:   11/26/19 1523 11/26/19 1925 11/27/19 0350 11/27/19 0818  BP: 104/77 97/74 96/68  98/63  Pulse: 95 98 95 100  Resp: 16 17 17 18   Temp: 98.8 F (37.1 C) 98.6 F (37 C) 98.4 F (36.9 C) 98.2 F (36.8 C)  TempSrc: Oral Oral Oral Oral  SpO2: 100% 100% 100% 100%  Weight:      Height:        Intake/Output Summary (Last 24 hours) at 11/27/2019 1111 Last data filed at 11/26/2019 2135 Gross per 24 hour  Intake 980 ml  Output --  Net 980 ml   Filed Weights   09/29/19 2032  Weight: 49.9 kg    Examination:  General exam: Comfortable In no distress. Talking on the phone  Data Reviewed: I have personally reviewed following labs and imaging studies  CBC: Recent Labs  Lab 11/21/19 0412 11/26/19 0402  WBC 5.4 6.3  NEUTROABS 2.9 4.3  HGB 9.6* 8.5*  HCT 31.1* 27.2*  MCV 87.4 86.3  PLT 432* 175   Basic Metabolic Panel: Recent Labs  Lab 11/21/19 0412  NA 139  K 3.7  CL 104  CO2 24  GLUCOSE 165*  BUN 8  CREATININE 0.60  CALCIUM 9.4   GFR: Estimated Creatinine Clearance: 74.4 mL/min (by C-G formula based on SCr of 0.6 mg/dL). Liver Function Tests: No results for input(s): AST, ALT, ALKPHOS, BILITOT, PROT, ALBUMIN in the last 168 hours. No results for input(s): LIPASE, AMYLASE in the last 168 hours. No results for input(s): AMMONIA in the last 168 hours. Coagulation Profile: No results for input(s): INR, PROTIME in the last 168 hours. Cardiac Enzymes: No results for input(s): CKTOTAL, CKMB, CKMBINDEX, TROPONINI in the last 168 hours. BNP (last 3 results) No results for input(s): PROBNP in the last 8760  hours. HbA1C: No results for input(s): HGBA1C in the last 72 hours. CBG: No results for input(s): GLUCAP in the last 168 hours. Lipid Profile: No results for input(s): CHOL, HDL, LDLCALC, TRIG, CHOLHDL, LDLDIRECT in the last 72 hours. Thyroid Function Tests: No results for input(s): TSH, T4TOTAL, FREET4, T3FREE, THYROIDAB in the last 72 hours. Anemia Panel: No results for input(s): VITAMINB12, FOLATE, FERRITIN, TIBC, IRON, RETICCTPCT in the last 72 hours. Sepsis Labs: No results for input(s): PROCALCITON, LATICACIDVEN in the last 168 hours.  No results found for this or any previous visit (from the past 240 hour(s)).       Radiology Studies: No results found.      Scheduled Meds: . acetaminophen  1,000 mg Oral TID  . amphetamine-dextroamphetamine  30 mg Oral BID  . ARIPiprazole  20 mg Oral Daily  . buPROPion  150 mg Oral Daily  . Chlorhexidine Gluconate Cloth  6 each Topical Q0600  . diclofenac Sodium  4 g Topical QID  . feeding supplement (ENSURE ENLIVE)  237 mL Oral BID BM  . ferrous sulfate  325 mg Oral Q breakfast  . gabapentin  100 mg Oral TID  . influenza vac split quadrivalent PF  0.5 mL Intramuscular Tomorrow-1000  . multivitamin with minerals  1 tablet Oral Daily  . nicotine  14 mg Transdermal Daily  . rifabutin  300 mg Oral Daily  . rivaroxaban  20 mg Oral Daily  . senna-docusate  1 tablet Oral BID  . sodium chloride flush  10-40 mL Intracatheter Q12H   Continuous Infusions: . sodium chloride Stopped (11/18/19 1950)  . vancomycin 1,250 mg (11/27/19 0604)     LOS: 59 days    Time spent: 15 mins.More than 50% of that time was spent in counseling and/or coordination of care.      Shelly Coss, MD [facility] P3/08/2020, 11:11 AM

## 2019-11-27 NOTE — Plan of Care (Signed)
  Problem: Clinical Measurements: Goal: Will remain free from infection Outcome: Progressing   Problem: Nutrition: Goal: Adequate nutrition will be maintained Outcome: Progressing   Problem: Coping: Goal: Level of anxiety will decrease Outcome: Progressing   Problem: Pain Managment: Goal: General experience of comfort will improve Outcome: Progressing   Problem: Safety: Goal: Ability to remain free from injury will improve Outcome: Progressing   Problem: Skin Integrity: Goal: Risk for impaired skin integrity will decrease Outcome: Progressing   

## 2019-11-28 NOTE — Progress Notes (Signed)
PROGRESS NOTE    Colleen Ramirez  MWU:[id number] DOB: [birth year] DOA: 09/29/2019 PCP: Patient, No Pcp Per   Brief Narrative:  Patient is a 39 year old female with history of IV drug abuse, anxiety, depression, anemia, status post gastric bypass who presented to the emergency department on 1/12 with complaints of 2-day history of sharp  neck pain.  She was recently hospitalized from 12/20-2/24 for facial cellulitis.  MRI cervical spine showed discitis, osteomyelitis at C5-C6 and C6-7.  Found to have prevertebral/retropharyngeal soft tissue phlegmonous inflammation with areas of nonenhancing material consistent with pus.  Underwent C5-C6 anterior discectomy, instrumented fusion cultures positive for MRSA.  Started on vancomycin, rifampin.  Follow-up cervical MRI showed progression of findings consistent with discitis/osteomyelitis involving C4, C5, C6 and C7 vertebrae.  Neurosurgery and ID recommended to continue aggressive medical therapy.  Current plan is to continue IV antibiotics till March 26.    Assessment & Plan:   Principal Problem:   Abscess in epidural space of cervical spine Active Problems:   Depression   Acquired fusion of cervical spine   IVDU (intravenous drug user)   Homeless   S/P cervical discectomy   Anxiety   Status post gastric bypass for obesity   Hepatitis C antibody positive in blood   Acute bilateral thoracic back pain   Body aches   Methamphetamine abuse (HCC)   Pain of right sternoclavicular joint   Cervical spine epidural abscess with vertebral osteomyelitis: Status post C4-C7 fusion.  Plan is to continue IV antibiotics till March 26. Neurosurgery and ID were following.  History of IV drug abuse/hepatitis C: Needs follow up at pain management clinic / drug rehabilitation .  Needs to follow-up as an outpatient for hepatitis C management.  We'll try to taper pain medications.  Bilateral upper extremity DVT: Ultrasound showed right upper extremity superficial  and deep venous thrombosis of cephalic and brachial veins and right superficial vein thrombosis of basilic and  cephalic vein.  On Xarelto  Depression/anxiety/drug abuse: On Adderall, aripiprazole, trazodone, bupropion.  Chronic normocytic anemia: Currently hemoglobin stable in the range of 8.  Iron studies showed iron deficiency.  Started on oral iron replacement.  Nutrition Problem: Increased nutrient needs Etiology: post-op healing      DVT prophylaxis: Xarelto Code Status: Full Family Communication: None present at the bedside Disposition Plan: Patient is from home.  Needs to continue IV antibiotics till March 26.  Expected discharge to home after completion of IV antibiotics.   Consultants: ID, neurosurgery  Procedures: As above  Antimicrobials:  Anti-infectives (From admission, onward)   Start     Dose/Rate Route Frequency Ordered Stop   11/10/19 1200  vancomycin (VANCOREADY) IVPB 1250 mg/250 mL     1,250 mg 166.7 mL/hr over 90 Minutes Intravenous Every 12 hours 11/10/19 0627     11/09/19 2200  rifabutin (MYCOBUTIN) capsule 300 mg  Status:  Discontinued     300 mg Oral Every 12 hours 11/09/19 1136 11/09/19 1646   11/09/19 2200  rifabutin (MYCOBUTIN) capsule 300 mg     300 mg Oral Daily 11/09/19 1646     11/05/19 1600  vancomycin (VANCOCIN) IVPB 1000 mg/200 mL premix  Status:  Discontinued     1,000 mg 200 mL/hr over 60 Minutes Intravenous Every 12 hours 11/05/19 1504 11/10/19 0627   11/01/19 1400  vancomycin (VANCOREADY) IVPB 1500 mg/300 mL  Status:  Discontinued     1,500 mg 150 mL/hr over 120 Minutes Intravenous Every 12 hours 11/01/19 1305 11/05/19 7253  10/28/19 1000  vancomycin (VANCOREADY) IVPB 1500 mg/300 mL  Status:  Discontinued     1,500 mg 150 mL/hr over 120 Minutes Intravenous Every 12 hours 10/27/19 2353 11/01/19 1305   10/14/19 1000  vancomycin (VANCOREADY) IVPB 1250 mg/250 mL  Status:  Discontinued     1,250 mg 166.7 mL/hr over 90 Minutes Intravenous  Every 12 hours 10/14/19 0540 10/27/19 2353   10/13/19 0945  Vancomycin (VANCOCIN) 1,250 mg in sodium chloride 0.9 % 250 mL IVPB  Status:  Discontinued     1,250 mg 166.7 mL/hr over 90 Minutes Intravenous Every 12 hours 10/13/19 0935 10/14/19 0540   10/13/19 0930  Vancomycin (VANCOCIN) 1,250 mg in sodium chloride 0.9 % 250 mL IVPB  Status:  Discontinued     1,250 mg 166.7 mL/hr over 90 Minutes Intravenous Every 12 hours 10/13/19 0902 10/13/19 0934   10/09/19 2000  vancomycin (VANCOCIN) IVPB 1000 mg/200 mL premix  Status:  Discontinued     1,000 mg 200 mL/hr over 60 Minutes Intravenous Every 12 hours 10/09/19 1121 10/13/19 0902   10/04/19 2200  vancomycin (VANCOCIN) IVPB 750 mg/150 ml premix  Status:  Discontinued     750 mg 150 mL/hr over 60 Minutes Intravenous Every 8 hours 10/04/19 1305 10/09/19 1121   10/04/19 1400  vancomycin (VANCOCIN) IVPB 1000 mg/200 mL premix     1,000 mg 200 mL/hr over 60 Minutes Intravenous STAT 10/04/19 1305 10/04/19 1604   10/01/19 1100  rifampin (RIFADIN) capsule 300 mg  Status:  Discontinued     300 mg Oral Every 12 hours 10/01/19 1056 11/09/19 1136   09/30/19 1800  Vancomycin (VANCOCIN) 1,250 mg in sodium chloride 0.9 % 250 mL IVPB  Status:  Discontinued     1,250 mg 166.7 mL/hr over 90 Minutes Intravenous Every 24 hours 09/30/19 0003 09/30/19 0747   09/30/19 1800  vancomycin (VANCOREADY) IVPB 1250 mg/250 mL  Status:  Discontinued     1,250 mg 166.7 mL/hr over 90 Minutes Intravenous Every 24 hours 09/30/19 0746 10/04/19 1305   09/30/19 0400  piperacillin-tazobactam (ZOSYN) IVPB 3.375 g  Status:  Discontinued     3.375 g 12.5 mL/hr over 240 Minutes Intravenous Every 8 hours 09/30/19 0003 09/30/19 1533   09/29/19 2145  piperacillin-tazobactam (ZOSYN) IVPB 3.375 g     3.375 g 12.5 mL/hr over 240 Minutes Intravenous STAT 09/29/19 2137 09/29/19 2148   09/29/19 2119  bacitracin 50,000 Units in sodium chloride 0.9 % 500 mL irrigation  Status:  Discontinued        As needed 09/29/19 2119 09/29/19 2249   09/29/19 2033  vancomycin (VANCOCIN) 1-5 GM/200ML-% IVPB    Note to Pharmacy: Toney Sang   : cabinet override      09/29/19 2033 09/30/19 0844      Subjective: Patient seen and examined the bedside this morning.  Comfortable.  No active issues.  Waiting for completion of antibiotics  objective: Vitals:   11/27/19 1353 11/27/19 2031 11/28/19 0343 11/28/19 0813  BP: 98/69 93/68 100/65   Pulse: 100 100 85 100  Resp: 17 17 18 17   Temp: 98.3 F (36.8 C) 98.2 F (36.8 C) 98.7 F (37.1 C) 98.5 F (36.9 C)  TempSrc: Oral Oral Oral Oral  SpO2: 96% 100% 100% 96%  Weight:      Height:        Intake/Output Summary (Last 24 hours) at 11/28/2019 0840 Last data filed at 11/27/2019 2230 Gross per 24 hour  Intake 240 ml  Output --  Net 240 ml   Filed Weights   09/29/19 2032  Weight: 49.9 kg    Examination:  General examination: Comfortable, not in any kind of distress CVS: Regular rate and rhythm Respiratory: Bilaterally clear, no crackles or wheezes Skin: No ulcers Abdomen: Soft, nontender  Data Reviewed: I have personally reviewed following labs and imaging studies  CBC: Recent Labs  Lab 11/26/19 0402  WBC 6.3  NEUTROABS 4.3  HGB 8.5*  HCT 27.2*  MCV 86.3  PLT 638   Basic Metabolic Panel: No results for input(s): NA, K, CL, CO2, GLUCOSE, BUN, CREATININE, CALCIUM, MG, PHOS in the last 168 hours. GFR: Estimated Creatinine Clearance: 74.4 mL/min (by C-G formula based on SCr of 0.6 mg/dL). Liver Function Tests: No results for input(s): AST, ALT, ALKPHOS, BILITOT, PROT, ALBUMIN in the last 168 hours. No results for input(s): LIPASE, AMYLASE in the last 168 hours. No results for input(s): AMMONIA in the last 168 hours. Coagulation Profile: No results for input(s): INR, PROTIME in the last 168 hours. Cardiac Enzymes: No results for input(s): CKTOTAL, CKMB, CKMBINDEX, TROPONINI in the last 168 hours. BNP (last 3 results) No  results for input(s): PROBNP in the last 8760 hours. HbA1C: No results for input(s): HGBA1C in the last 72 hours. CBG: No results for input(s): GLUCAP in the last 168 hours. Lipid Profile: No results for input(s): CHOL, HDL, LDLCALC, TRIG, CHOLHDL, LDLDIRECT in the last 72 hours. Thyroid Function Tests: No results for input(s): TSH, T4TOTAL, FREET4, T3FREE, THYROIDAB in the last 72 hours. Anemia Panel: No results for input(s): VITAMINB12, FOLATE, FERRITIN, TIBC, IRON, RETICCTPCT in the last 72 hours. Sepsis Labs: No results for input(s): PROCALCITON, LATICACIDVEN in the last 168 hours.  No results found for this or any previous visit (from the past 240 hour(s)).       Radiology Studies: No results found.      Scheduled Meds: . acetaminophen  1,000 mg Oral TID  . amphetamine-dextroamphetamine  30 mg Oral BID  . ARIPiprazole  20 mg Oral Daily  . buPROPion  150 mg Oral Daily  . Chlorhexidine Gluconate Cloth  6 each Topical Q0600  . diclofenac Sodium  4 g Topical QID  . feeding supplement (ENSURE ENLIVE)  237 mL Oral BID BM  . ferrous sulfate  325 mg Oral Q breakfast  . gabapentin  100 mg Oral TID  . influenza vac split quadrivalent PF  0.5 mL Intramuscular Tomorrow-1000  . multivitamin with minerals  1 tablet Oral Daily  . nicotine  14 mg Transdermal Daily  . rifabutin  300 mg Oral Daily  . rivaroxaban  20 mg Oral Daily  . senna-docusate  1 tablet Oral BID  . sodium chloride flush  10-40 mL Intracatheter Q12H   Continuous Infusions: . sodium chloride Stopped (11/18/19 1950)  . vancomycin 1,250 mg (11/28/19 0546)     LOS: 60 days    Time spent: 15 mins.More than 50% of that time was spent in counseling and/or coordination of care.      Shelly Coss, MD [facility] P3/13/2021, 8:40 AM

## 2019-11-28 NOTE — Plan of Care (Signed)
  Problem: Clinical Measurements: Goal: Will remain free from infection Outcome: Progressing Goal: Cardiovascular complication will be avoided Outcome: Progressing   Problem: Coping: Goal: Level of anxiety will decrease Outcome: Progressing   Problem: Pain Managment: Goal: General experience of comfort will improve Outcome: Progressing   Problem: Safety: Goal: Ability to remain free from injury will improve Outcome: Progressing   Problem: Skin Integrity: Goal: Risk for impaired skin integrity will decrease Outcome: Progressing   

## 2019-11-28 NOTE — Plan of Care (Signed)
  Problem: Skin Integrity: Goal: Risk for impaired skin integrity will decrease Outcome: Progressing   Problem: Safety: Goal: Ability to remain free from injury will improve Outcome: Progressing   Problem: Pain Managment: Goal: General experience of comfort will improve Outcome: Progressing   Problem: Elimination: Goal: Will not experience complications related to bowel motility Outcome: Progressing   Problem: Coping: Goal: Level of anxiety will decrease Outcome: Progressing   Problem: Clinical Measurements: Goal: Will remain free from infection Outcome: Progressing   Problem: Clinical Measurements: Goal: Diagnostic test results will improve Outcome: Progressing

## 2019-11-29 LAB — VANCOMYCIN, PEAK: Vancomycin Pk: 42 ug/mL — ABNORMAL HIGH (ref 30–40)

## 2019-11-29 NOTE — Plan of Care (Signed)
  Problem: Pain Managment: Goal: General experience of comfort will improve Outcome: Progressing   Problem: Safety: Goal: Ability to remain free from injury will improve Outcome: Progressing   Problem: Skin Integrity: Goal: Risk for impaired skin integrity will decrease Outcome: Progressing   

## 2019-11-29 NOTE — Progress Notes (Signed)
PROGRESS NOTE    Colleen Ramirez  IDP:[id number] DOB: [birth year] DOA: 09/29/2019 PCP: Patient, No Pcp Per   Brief Narrative:  Patient is a 39 year old female with history of IV drug abuse, anxiety, depression, anemia, status post gastric bypass who presented to the emergency department on 1/12 with complaints of 2-day history of sharp  neck pain.  She was recently hospitalized from 12/20-2/24 for facial cellulitis.  MRI cervical spine showed discitis, osteomyelitis at C5-C6 and C6-7.  Found to have prevertebral/retropharyngeal soft tissue phlegmonous inflammation with areas of nonenhancing material consistent with pus.  Underwent C5-C6 anterior discectomy, instrumented fusion cultures positive for MRSA.  Started on vancomycin, rifampin.  Follow-up cervical MRI showed progression of findings consistent with discitis/osteomyelitis involving C4, C5, C6 and C7 vertebrae.  Neurosurgery and ID recommended to continue aggressive medical therapy.  Current plan is to continue IV antibiotics till March 26.    Assessment & Plan:   Principal Problem:   Abscess in epidural space of cervical spine Active Problems:   Depression   Acquired fusion of cervical spine   IVDU (intravenous drug user)   Homeless   S/P cervical discectomy   Anxiety   Status post gastric bypass for obesity   Hepatitis C antibody positive in blood   Acute bilateral thoracic back pain   Body aches   Methamphetamine abuse (HCC)   Pain of right sternoclavicular joint   Cervical spine epidural abscess with vertebral osteomyelitis: Status post C4-C7 fusion.  Plan is to continue IV antibiotics till March 26. Neurosurgery and ID were following.  History of IV drug abuse/hepatitis C: Needs follow up at pain management clinic / drug rehabilitation .  Needs to follow-up as an outpatient for hepatitis C management.  We'll try to taper pain medications.  Bilateral upper extremity DVT: Ultrasound showed right upper extremity superficial  and deep venous thrombosis of cephalic and brachial veins and right superficial vein thrombosis of basilic and  cephalic vein.  On Xarelto  Depression/anxiety/drug abuse: On Adderall, aripiprazole, trazodone, bupropion.  Chronic normocytic anemia: Currently hemoglobin stable in the range of 8.  Iron studies showed iron deficiency.  Started on oral iron replacement.  Nutrition Problem: Increased nutrient needs Etiology: post-op healing      DVT prophylaxis: Xarelto Code Status: Full Family Communication: None present at the bedside Disposition Plan: Patient is from home.  Needs to continue IV antibiotics till March 26.  Expected discharge to home after completion of IV antibiotics.   Consultants: ID, neurosurgery  Procedures: As above  Antimicrobials:  Anti-infectives (From admission, onward)   Start     Dose/Rate Route Frequency Ordered Stop   11/10/19 1200  vancomycin (VANCOREADY) IVPB 1250 mg/250 mL     1,250 mg 166.7 mL/hr over 90 Minutes Intravenous Every 12 hours 11/10/19 0627     11/09/19 2200  rifabutin (MYCOBUTIN) capsule 300 mg  Status:  Discontinued     300 mg Oral Every 12 hours 11/09/19 1136 11/09/19 1646   11/09/19 2200  rifabutin (MYCOBUTIN) capsule 300 mg     300 mg Oral Daily 11/09/19 1646     11/05/19 1600  vancomycin (VANCOCIN) IVPB 1000 mg/200 mL premix  Status:  Discontinued     1,000 mg 200 mL/hr over 60 Minutes Intravenous Every 12 hours 11/05/19 1504 11/10/19 0627   11/01/19 1400  vancomycin (VANCOREADY) IVPB 1500 mg/300 mL  Status:  Discontinued     1,500 mg 150 mL/hr over 120 Minutes Intravenous Every 12 hours 11/01/19 1305 11/05/19 4431  10/28/19 1000  vancomycin (VANCOREADY) IVPB 1500 mg/300 mL  Status:  Discontinued     1,500 mg 150 mL/hr over 120 Minutes Intravenous Every 12 hours 10/27/19 2353 11/01/19 1305   10/14/19 1000  vancomycin (VANCOREADY) IVPB 1250 mg/250 mL  Status:  Discontinued     1,250 mg 166.7 mL/hr over 90 Minutes Intravenous  Every 12 hours 10/14/19 0540 10/27/19 2353   10/13/19 0945  Vancomycin (VANCOCIN) 1,250 mg in sodium chloride 0.9 % 250 mL IVPB  Status:  Discontinued     1,250 mg 166.7 mL/hr over 90 Minutes Intravenous Every 12 hours 10/13/19 0935 10/14/19 0540   10/13/19 0930  Vancomycin (VANCOCIN) 1,250 mg in sodium chloride 0.9 % 250 mL IVPB  Status:  Discontinued     1,250 mg 166.7 mL/hr over 90 Minutes Intravenous Every 12 hours 10/13/19 0902 10/13/19 0934   10/09/19 2000  vancomycin (VANCOCIN) IVPB 1000 mg/200 mL premix  Status:  Discontinued     1,000 mg 200 mL/hr over 60 Minutes Intravenous Every 12 hours 10/09/19 1121 10/13/19 0902   10/04/19 2200  vancomycin (VANCOCIN) IVPB 750 mg/150 ml premix  Status:  Discontinued     750 mg 150 mL/hr over 60 Minutes Intravenous Every 8 hours 10/04/19 1305 10/09/19 1121   10/04/19 1400  vancomycin (VANCOCIN) IVPB 1000 mg/200 mL premix     1,000 mg 200 mL/hr over 60 Minutes Intravenous STAT 10/04/19 1305 10/04/19 1604   10/01/19 1100  rifampin (RIFADIN) capsule 300 mg  Status:  Discontinued     300 mg Oral Every 12 hours 10/01/19 1056 11/09/19 1136   09/30/19 1800  Vancomycin (VANCOCIN) 1,250 mg in sodium chloride 0.9 % 250 mL IVPB  Status:  Discontinued     1,250 mg 166.7 mL/hr over 90 Minutes Intravenous Every 24 hours 09/30/19 0003 09/30/19 0747   09/30/19 1800  vancomycin (VANCOREADY) IVPB 1250 mg/250 mL  Status:  Discontinued     1,250 mg 166.7 mL/hr over 90 Minutes Intravenous Every 24 hours 09/30/19 0746 10/04/19 1305   09/30/19 0400  piperacillin-tazobactam (ZOSYN) IVPB 3.375 g  Status:  Discontinued     3.375 g 12.5 mL/hr over 240 Minutes Intravenous Every 8 hours 09/30/19 0003 09/30/19 1533   09/29/19 2145  piperacillin-tazobactam (ZOSYN) IVPB 3.375 g     3.375 g 12.5 mL/hr over 240 Minutes Intravenous STAT 09/29/19 2137 09/29/19 2148   09/29/19 2119  bacitracin 50,000 Units in sodium chloride 0.9 % 500 mL irrigation  Status:  Discontinued        As needed 09/29/19 2119 09/29/19 2249   09/29/19 2033  vancomycin (VANCOCIN) 1-5 GM/200ML-% IVPB    Note to Pharmacy: Ubaldo Glassing   : cabinet override      09/29/19 2033 09/30/19 0844      Subjective: Patient seen and examined the bedside this morning.  Heart rate stable.  No active issues  objective: Vitals:   11/28/19 1500 11/28/19 2000 11/28/19 2011 11/29/19 0404  BP: 97/68 108/68 108/68 98/66  Pulse: 99 94 (!) 104 100  Resp: 17 16 16 17   Temp: 98.2 F (36.8 C) 98.1 F (36.7 C) 98.1 F (36.7 C) 99.1 F (37.3 C)  TempSrc: Oral Oral Oral Oral  SpO2: 100% 100% 100% 98%  Weight:      Height:        Intake/Output Summary (Last 24 hours) at 11/29/2019 1120 Last data filed at 11/29/2019 0823 Gross per 24 hour  Intake 10 ml  Output --  Net 10  ml   Filed Weights   09/29/19 2032  Weight: 49.9 kg    Examination:  General examination: Comfortable, not in any kind of distress, standing near her bathroom during my evaluation   Data Reviewed: I have personally reviewed following labs and imaging studies  CBC: Recent Labs  Lab 11/26/19 0402  WBC 6.3  NEUTROABS 4.3  HGB 8.5*  HCT 27.2*  MCV 86.3  PLT 312   Basic Metabolic Panel: No results for input(s): NA, K, CL, CO2, GLUCOSE, BUN, CREATININE, CALCIUM, MG, PHOS in the last 168 hours. GFR: Estimated Creatinine Clearance: 74.4 mL/min (by C-G formula based on SCr of 0.6 mg/dL). Liver Function Tests: No results for input(s): AST, ALT, ALKPHOS, BILITOT, PROT, ALBUMIN in the last 168 hours. No results for input(s): LIPASE, AMYLASE in the last 168 hours. No results for input(s): AMMONIA in the last 168 hours. Coagulation Profile: No results for input(s): INR, PROTIME in the last 168 hours. Cardiac Enzymes: No results for input(s): CKTOTAL, CKMB, CKMBINDEX, TROPONINI in the last 168 hours. BNP (last 3 results) No results for input(s): PROBNP in the last 8760 hours. HbA1C: No results for input(s): HGBA1C in the  last 72 hours. CBG: No results for input(s): GLUCAP in the last 168 hours. Lipid Profile: No results for input(s): CHOL, HDL, LDLCALC, TRIG, CHOLHDL, LDLDIRECT in the last 72 hours. Thyroid Function Tests: No results for input(s): TSH, T4TOTAL, FREET4, T3FREE, THYROIDAB in the last 72 hours. Anemia Panel: No results for input(s): VITAMINB12, FOLATE, FERRITIN, TIBC, IRON, RETICCTPCT in the last 72 hours. Sepsis Labs: No results for input(s): PROCALCITON, LATICACIDVEN in the last 168 hours.  No results found for this or any previous visit (from the past 240 hour(s)).       Radiology Studies: No results found.      Scheduled Meds: . acetaminophen  1,000 mg Oral TID  . amphetamine-dextroamphetamine  30 mg Oral BID  . ARIPiprazole  20 mg Oral Daily  . buPROPion  150 mg Oral Daily  . Chlorhexidine Gluconate Cloth  6 each Topical Q0600  . diclofenac Sodium  4 g Topical QID  . feeding supplement (ENSURE ENLIVE)  237 mL Oral BID BM  . ferrous sulfate  325 mg Oral Q breakfast  . gabapentin  100 mg Oral TID  . influenza vac split quadrivalent PF  0.5 mL Intramuscular Tomorrow-1000  . multivitamin with minerals  1 tablet Oral Daily  . nicotine  14 mg Transdermal Daily  . rifabutin  300 mg Oral Daily  . rivaroxaban  20 mg Oral Daily  . senna-docusate  1 tablet Oral BID  . sodium chloride flush  10-40 mL Intracatheter Q12H   Continuous Infusions: . sodium chloride Stopped (11/18/19 1950)  . vancomycin 1,250 mg (11/29/19 0526)     LOS: 61 days    Time spent: 15 mins.More than 50% of that time was spent in counseling and/or coordination of care.      Burnadette Pop, MD [facility] P3/14/2021, 11:20 AM

## 2019-11-29 NOTE — Plan of Care (Signed)
  Problem: Clinical Measurements: Goal: Will remain free from infection Outcome: Progressing Goal: Cardiovascular complication will be avoided Outcome: Progressing   Problem: Coping: Goal: Level of anxiety will decrease Outcome: Progressing   Problem: Pain Managment: Goal: General experience of comfort will improve Outcome: Progressing   Problem: Safety: Goal: Ability to remain free from injury will improve Outcome: Progressing   Problem: Skin Integrity: Goal: Risk for impaired skin integrity will decrease Outcome: Progressing   

## 2019-11-30 LAB — BASIC METABOLIC PANEL
Anion gap: 10 (ref 5–15)
BUN: 8 mg/dL (ref 6–20)
CO2: 23 mmol/L (ref 22–32)
Calcium: 8.3 mg/dL — ABNORMAL LOW (ref 8.9–10.3)
Chloride: 103 mmol/L (ref 98–111)
Creatinine, Ser: 0.65 mg/dL (ref 0.44–1.00)
GFR calc Af Amer: >60 mL/min (ref >60)
GFR calc non Af Amer: >60 mL/min (ref >60)
Glucose, Bld: 182 mg/dL — ABNORMAL HIGH (ref 70–99)
Potassium: 3.7 mmol/L (ref 3.5–5.1)
Sodium: 136 mmol/L (ref 135–145)

## 2019-11-30 LAB — VANCOMYCIN, TROUGH: Vancomycin Tr: 20 ug/mL (ref 15–20)

## 2019-11-30 MED ORDER — VANCOMYCIN HCL 750 MG/150ML IV SOLN
750.0000 mg | Freq: Two times a day (BID) | INTRAVENOUS | Status: DC
  Administered 2019-11-30 – 2019-12-03 (×7): 750 mg via INTRAVENOUS
  Filled 2019-11-30 (×8): qty 150

## 2019-11-30 NOTE — Progress Notes (Signed)
Pharmacy Antibiotic Note  Colleen Ramirez is a 39 y.o. female admitted on 09/29/2019 with MRSA cervical epidural abscess/osteo.  Pharmacy has been consulted for Vancomycin dosing.  ID: Abx for MRSA cervical epidural abscess/osteo now s/p surgical decompression, discectomy and fusion 1/12. Echo negative for vegetations. 6 weeks of abx. No PICC line due to PSA. Discharge on doxycycline/rifabutin  (rifampn chg'd to rifabutin d/t starting Xarelto) - 2/14: MRI: progression of discitis/osteo of C4-C7. Persistent ventral greater than dorsal epidural phlegmon spanning the C2-C7 levels, persistent vertebral, retropharyngeal phlegmon  - afebrile, WBC WNL, Scr WNL  3/15 AM update:  AUC is supra-therapeutic at 735  No SCr has been done since March 6th-will order a STAT BMET to assess SCr trend   1/12 Zosyn>>1/13 1/12 Vanc >> (3/26) 1/14 Rifampin switched 2/22 to Rifabutin d/t DOAC>> (3/26)  Vanc levels: 2/23: VP 17, VT 8 >> AUC 386, (on 1g q12h)>change 1250 Q12 for calc'd AUC 480 3/1: AUC is therapeutic at 424 on Vancomycin 1250 mg IV q12h 3/15: AUC is supra-therapeutic at 735  1/12 cervical abscess: abundant MRSA, MIC to Vanc < 0.5 1/14 HCV+ 1/14 MRSA PCR positive 2/15 blood x 2: neg   Plan: -Dec vancomycin to 750 mg IV q12h -STAT BMET -Re-check levels as needed  -IV abx stop date 3/26   Height: 5\' 3"  (160 cm) Weight: 110 lb (49.9 kg) IBW/kg (Calculated) : 52.4  Temp (24hrs), Avg:98.6 F (37 C), Min:98.2 F (36.8 C), Max:98.9 F (37.2 C)  Recent Labs  Lab 11/26/19 0402 11/29/19 2053 11/30/19 0508  WBC 6.3  --   --   VANCOTROUGH  --   --  20  VANCOPEAK  --  42*  --     Estimated Creatinine Clearance: 74.4 mL/min (by C-G formula based on SCr of 0.6 mg/dL).    No Known Allergies  12/02/19, PharmD, BCPS Clinical Pharmacist Phone: [phone]

## 2019-11-30 NOTE — Progress Notes (Signed)
PROGRESS NOTE    Colleen Ramirez  GNF:[id number] DOB: [birth year] DOA: 09/29/2019 PCP: Patient, No Pcp Per   Brief Narrative:  Patient is a 39 year old female with history of IV drug abuse, anxiety, depression, anemia, status post gastric bypass who presented to the emergency department on 1/12 with complaints of 2-day history of sharp  neck pain.  She was recently hospitalized from 12/20-2/24 for facial cellulitis.  MRI cervical spine showed discitis, osteomyelitis at C5-C6 and C6-7.  Found to have prevertebral/retropharyngeal soft tissue phlegmonous inflammation with areas of nonenhancing material consistent with pus.  Underwent C5-C6 anterior discectomy, instrumented fusion cultures positive for MRSA.  Started on vancomycin, rifampin.  Follow-up cervical MRI showed progression of findings consistent with discitis/osteomyelitis involving C4, C5, C6 and C7 vertebrae.  Neurosurgery and ID recommended to continue aggressive medical therapy.  Current plan is to continue IV antibiotics till March 26.    Assessment & Plan:   Principal Problem:   Abscess in epidural space of cervical spine Active Problems:   Depression   Acquired fusion of cervical spine   IVDU (intravenous drug user)   Homeless   S/P cervical discectomy   Anxiety   Status post gastric bypass for obesity   Hepatitis C antibody positive in blood   Acute bilateral thoracic back pain   Body aches   Methamphetamine abuse (HCC)   Pain of right sternoclavicular joint   Cervical spine epidural abscess with vertebral osteomyelitis: Status post C4-C7 fusion.  Plan is to continue IV antibiotics till March 26. Neurosurgery and ID were following.  History of IV drug abuse/hepatitis C: Needs follow up at pain management clinic / drug rehabilitation .  Needs to follow-up as an outpatient for hepatitis C management.  We'll try to taper pain medications.  Bilateral upper extremity DVT: Ultrasound showed right upper extremity superficial  and deep venous thrombosis of cephalic and brachial veins and right superficial vein thrombosis of basilic and  cephalic vein.  On Xarelto  Depression/anxiety/drug abuse: On Adderall, aripiprazole, trazodone, bupropion.  Chronic normocytic anemia: Currently hemoglobin stable in the range of 8.  Iron studies showed iron deficiency.  Started on oral iron replacement.  Nutrition Problem: Increased nutrient needs Etiology: post-op healing      DVT prophylaxis: Xarelto Code Status: Full Family Communication: None present at the bedside Disposition Plan: Patient is from home.  Needs to continue IV antibiotics till March 26.  Expected discharge to home after completion of IV antibiotics.   Consultants: ID, neurosurgery  Procedures: As above  Antimicrobials:  Anti-infectives (From admission, onward)   Start     Dose/Rate Route Frequency Ordered Stop   11/30/19 2200  vancomycin (VANCOREADY) IVPB 750 mg/150 mL     750 mg 150 mL/hr over 60 Minutes Intravenous Every 12 hours 11/30/19 0634     11/10/19 1200  vancomycin (VANCOREADY) IVPB 1250 mg/250 mL  Status:  Discontinued     1,250 mg 166.7 mL/hr over 90 Minutes Intravenous Every 12 hours 11/10/19 0627 11/30/19 0634   11/09/19 2200  rifabutin (MYCOBUTIN) capsule 300 mg  Status:  Discontinued     300 mg Oral Every 12 hours 11/09/19 1136 11/09/19 1646   11/09/19 2200  rifabutin (MYCOBUTIN) capsule 300 mg     300 mg Oral Daily 11/09/19 1646     11/05/19 1600  vancomycin (VANCOCIN) IVPB 1000 mg/200 mL premix  Status:  Discontinued     1,000 mg 200 mL/hr over 60 Minutes Intravenous Every 12 hours 11/05/19 1504 11/10/19 8469  11/01/19 1400  vancomycin (VANCOREADY) IVPB 1500 mg/300 mL  Status:  Discontinued     1,500 mg 150 mL/hr over 120 Minutes Intravenous Every 12 hours 11/01/19 1305 11/05/19 0823   10/28/19 1000  vancomycin (VANCOREADY) IVPB 1500 mg/300 mL  Status:  Discontinued     1,500 mg 150 mL/hr over 120 Minutes Intravenous Every  12 hours 10/27/19 2353 11/01/19 1305   10/14/19 1000  vancomycin (VANCOREADY) IVPB 1250 mg/250 mL  Status:  Discontinued     1,250 mg 166.7 mL/hr over 90 Minutes Intravenous Every 12 hours 10/14/19 0540 10/27/19 2353   10/13/19 0945  Vancomycin (VANCOCIN) 1,250 mg in sodium chloride 0.9 % 250 mL IVPB  Status:  Discontinued     1,250 mg 166.7 mL/hr over 90 Minutes Intravenous Every 12 hours 10/13/19 0935 10/14/19 0540   10/13/19 0930  Vancomycin (VANCOCIN) 1,250 mg in sodium chloride 0.9 % 250 mL IVPB  Status:  Discontinued     1,250 mg 166.7 mL/hr over 90 Minutes Intravenous Every 12 hours 10/13/19 0902 10/13/19 0934   10/09/19 2000  vancomycin (VANCOCIN) IVPB 1000 mg/200 mL premix  Status:  Discontinued     1,000 mg 200 mL/hr over 60 Minutes Intravenous Every 12 hours 10/09/19 1121 10/13/19 0902   10/04/19 2200  vancomycin (VANCOCIN) IVPB 750 mg/150 ml premix  Status:  Discontinued     750 mg 150 mL/hr over 60 Minutes Intravenous Every 8 hours 10/04/19 1305 10/09/19 1121   10/04/19 1400  vancomycin (VANCOCIN) IVPB 1000 mg/200 mL premix     1,000 mg 200 mL/hr over 60 Minutes Intravenous STAT 10/04/19 1305 10/04/19 1604   10/01/19 1100  rifampin (RIFADIN) capsule 300 mg  Status:  Discontinued     300 mg Oral Every 12 hours 10/01/19 1056 11/09/19 1136   09/30/19 1800  Vancomycin (VANCOCIN) 1,250 mg in sodium chloride 0.9 % 250 mL IVPB  Status:  Discontinued     1,250 mg 166.7 mL/hr over 90 Minutes Intravenous Every 24 hours 09/30/19 0003 09/30/19 0747   09/30/19 1800  vancomycin (VANCOREADY) IVPB 1250 mg/250 mL  Status:  Discontinued     1,250 mg 166.7 mL/hr over 90 Minutes Intravenous Every 24 hours 09/30/19 0746 10/04/19 1305   09/30/19 0400  piperacillin-tazobactam (ZOSYN) IVPB 3.375 g  Status:  Discontinued     3.375 g 12.5 mL/hr over 240 Minutes Intravenous Every 8 hours 09/30/19 0003 09/30/19 1533   09/29/19 2145  piperacillin-tazobactam (ZOSYN) IVPB 3.375 g     3.375 g 12.5 mL/hr  over 240 Minutes Intravenous STAT 09/29/19 2137 09/29/19 2148   09/29/19 2119  bacitracin 50,000 Units in sodium chloride 0.9 % 500 mL irrigation  Status:  Discontinued       As needed 09/29/19 2119 09/29/19 2249   09/29/19 2033  vancomycin (VANCOCIN) 1-5 GM/200ML-% IVPB    Note to Pharmacy: Toney Sang   : cabinet override      09/29/19 2033 09/30/19 0844      Subjective: Patient seen and examined the bedside this morning.  Hemodynamically stable.  No active issues.  Wondering if she needs MRI before discharge.  objective: Vitals:   11/29/19 1700 11/29/19 2007 11/30/19 0442 11/30/19 0815  BP: 108/70 113/84 92/61 99/71   Pulse: 97 98 92 99  Resp:  17 17 18   Temp: 98.2 F (36.8 C) 98.8 F (37.1 C) 98.4 F (36.9 C) 98.1 F (36.7 C)  TempSrc: Oral Oral Oral Oral  SpO2: 100% 100% 100% 100%  Weight:  Height:        Intake/Output Summary (Last 24 hours) at 11/30/2019 1149 Last data filed at 11/30/2019 0900 Gross per 24 hour  Intake 240 ml  Output --  Net 240 ml   Filed Weights   09/29/19 2032  Weight: 49.9 kg    Examination:  General examination: Comfortable, not in any kind of distress, sitting in the chair, eating her breakfast.  Data Reviewed: I have personally reviewed following labs and imaging studies  CBC: Recent Labs  Lab 11/26/19 0402  WBC 6.3  NEUTROABS 4.3  HGB 8.5*  HCT 27.2*  MCV 86.3  PLT 312   Basic Metabolic Panel: Recent Labs  Lab 11/30/19 0950  NA 136  K 3.7  CL 103  CO2 23  GLUCOSE 182*  BUN 8  CREATININE 0.65  CALCIUM 8.3*   GFR: Estimated Creatinine Clearance: 74.4 mL/min (by C-G formula based on SCr of 0.65 mg/dL). Liver Function Tests: No results for input(s): AST, ALT, ALKPHOS, BILITOT, PROT, ALBUMIN in the last 168 hours. No results for input(s): LIPASE, AMYLASE in the last 168 hours. No results for input(s): AMMONIA in the last 168 hours. Coagulation Profile: No results for input(s): INR, PROTIME in the last 168  hours. Cardiac Enzymes: No results for input(s): CKTOTAL, CKMB, CKMBINDEX, TROPONINI in the last 168 hours. BNP (last 3 results) No results for input(s): PROBNP in the last 8760 hours. HbA1C: No results for input(s): HGBA1C in the last 72 hours. CBG: No results for input(s): GLUCAP in the last 168 hours. Lipid Profile: No results for input(s): CHOL, HDL, LDLCALC, TRIG, CHOLHDL, LDLDIRECT in the last 72 hours. Thyroid Function Tests: No results for input(s): TSH, T4TOTAL, FREET4, T3FREE, THYROIDAB in the last 72 hours. Anemia Panel: No results for input(s): VITAMINB12, FOLATE, FERRITIN, TIBC, IRON, RETICCTPCT in the last 72 hours. Sepsis Labs: No results for input(s): PROCALCITON, LATICACIDVEN in the last 168 hours.  No results found for this or any previous visit (from the past 240 hour(s)).       Radiology Studies: No results found.      Scheduled Meds: . acetaminophen  1,000 mg Oral TID  . amphetamine-dextroamphetamine  30 mg Oral BID  . ARIPiprazole  20 mg Oral Daily  . buPROPion  150 mg Oral Daily  . Chlorhexidine Gluconate Cloth  6 each Topical Q0600  . diclofenac Sodium  4 g Topical QID  . feeding supplement (ENSURE ENLIVE)  237 mL Oral BID BM  . ferrous sulfate  325 mg Oral Q breakfast  . gabapentin  100 mg Oral TID  . influenza vac split quadrivalent PF  0.5 mL Intramuscular Tomorrow-1000  . multivitamin with minerals  1 tablet Oral Daily  . nicotine  14 mg Transdermal Daily  . rifabutin  300 mg Oral Daily  . rivaroxaban  20 mg Oral Daily  . senna-docusate  1 tablet Oral BID  . sodium chloride flush  10-40 mL Intracatheter Q12H   Continuous Infusions: . sodium chloride Stopped (11/18/19 1950)  . vancomycin       LOS: 62 days    Time spent: 15 mins.More than 50% of that time was spent in counseling and/or coordination of care.      Burnadette Pop, MD [facility] P3/15/2021, 11:49 AM

## 2019-11-30 NOTE — Plan of Care (Signed)
  Problem: Clinical Measurements: Goal: Will remain free from infection Outcome: Progressing Goal: Diagnostic test results will improve Outcome: Progressing Goal: Respiratory complications will improve Outcome: Progressing Goal: Cardiovascular complication will be avoided Outcome: Progressing   Problem: Nutrition: Goal: Adequate nutrition will be maintained Outcome: Progressing   Problem: Coping: Goal: Level of anxiety will decrease Outcome: Progressing   Problem: Elimination: Goal: Will not experience complications related to bowel motility Outcome: Progressing Goal: Will not experience complications related to urinary retention Outcome: Progressing   Problem: Pain Managment: Goal: General experience of comfort will improve Outcome: Progressing   Problem: Safety: Goal: Ability to remain free from injury will improve Outcome: Progressing   Problem: Skin Integrity: Goal: Risk for impaired skin integrity will decrease Outcome: Progressing   

## 2019-11-30 NOTE — Progress Notes (Signed)
Pharmacy addendum:   See prior note regarding Vancomycin levels this am.  Trough level trended up to 20 mcg/ml, AUC supratherapeutic. Vanc dose reduced from 1250 mg to 750 mg IV q12h,  to begin at 10pm tonight.   Bmet this am consistent with prior values. BUN same.  Will check bmet al least twice a week while on Vancomycin.  Will plan to repeat Vanc levels on new regimen at steady state.   Dennie Fetters, [place] Phone: [phone] 11/30/2019 2:51 PM

## 2019-12-01 MED ORDER — HYDROMORPHONE HCL 2 MG PO TABS
1.0000 mg | ORAL_TABLET | ORAL | Status: DC | PRN
  Filled 2019-12-01: qty 1

## 2019-12-01 MED ORDER — HYDROMORPHONE HCL 2 MG PO TABS
2.0000 mg | ORAL_TABLET | ORAL | Status: DC | PRN
  Administered 2019-12-01 – 2019-12-04 (×17): 2 mg via ORAL
  Filled 2019-12-01 (×17): qty 1

## 2019-12-01 NOTE — Progress Notes (Signed)
PROGRESS NOTE    Colleen Ramirez  HRC:[id number] DOB: 12-Mar-1981 DOA: 09/29/2019 PCP: Patient, No Pcp Per   Brief Narrative:  Patient is a 39 year old female with history of IV drug abuse, anxiety, depression, anemia, status post gastric bypass who presented to the emergency department on 1/12 with complaints of 2-day history of sharp  neck pain.  She was recently hospitalized from 12/20-2/24 for facial cellulitis.  MRI cervical spine showed discitis, osteomyelitis at C5-C6 and C6-7.  Found to have prevertebral/retropharyngeal soft tissue phlegmonous inflammation with areas of nonenhancing material consistent with pus.  Underwent C5-C6 anterior discectomy, instrumented fusion cultures positive for MRSA.  Started on vancomycin, rifampin.  Follow-up cervical MRI showed progression of findings consistent with discitis/osteomyelitis involving C4, C5, C6 and C7 vertebrae.  Neurosurgery and ID recommended to continue aggressive medical therapy.  Current plan is to continue IV antibiotics till March 26.    Assessment & Plan:   Principal Problem:   Abscess in epidural space of cervical spine Active Problems:   Depression   Acquired fusion of cervical spine   IVDU (intravenous drug user)   Homeless   S/P cervical discectomy   Anxiety   Status post gastric bypass for obesity   Hepatitis C antibody positive in blood   Acute bilateral thoracic back pain   Body aches   Methamphetamine abuse (HCC)   Pain of right sternoclavicular joint   Cervical spine epidural abscess with vertebral osteomyelitis: Status post C4-C7 fusion.  Plan is to continue IV antibiotics till March 26. Neurosurgery and ID were following. She has been started on Dilaudid oral after surgery.  Continue to taper pain medicines.  History of IV drug abuse/hepatitis C: Needs follow up at pain management clinic / drug rehabilitation .  Needs to follow-up as an outpatient for hepatitis C management.    Bilateral upper extremity  DVT: Ultrasound showed right upper extremity superficial and deep venous thrombosis of cephalic and brachial veins and right superficial vein thrombosis of basilic and  cephalic vein.  On Xarelto  Depression/anxiety/drug abuse: On Adderall, aripiprazole, trazodone, bupropion.  Chronic normocytic anemia: Currently hemoglobin stable in the range of 8.  Iron studies showed iron deficiency.  Started on oral iron replacement.  Nutrition Problem: Increased nutrient needs Etiology: post-op healing      DVT prophylaxis: Xarelto Code Status: Full Family Communication: None present at the bedside Disposition Plan: Patient is from home.  Needs to continue IV antibiotics till March 26.  Expected discharge to home after completion of IV antibiotics.   Consultants: ID, neurosurgery  Procedures: As above  Antimicrobials:  Anti-infectives (From admission, onward)   Start     Dose/Rate Route Frequency Ordered Stop   11/30/19 2200  vancomycin (VANCOREADY) IVPB 750 mg/150 mL     750 mg 150 mL/hr over 60 Minutes Intravenous Every 12 hours 11/30/19 0634     11/10/19 1200  vancomycin (VANCOREADY) IVPB 1250 mg/250 mL  Status:  Discontinued     1,250 mg 166.7 mL/hr over 90 Minutes Intravenous Every 12 hours 11/10/19 0627 11/30/19 0634   11/09/19 2200  rifabutin (MYCOBUTIN) capsule 300 mg  Status:  Discontinued     300 mg Oral Every 12 hours 11/09/19 1136 11/09/19 1646   11/09/19 2200  rifabutin (MYCOBUTIN) capsule 300 mg     300 mg Oral Daily 11/09/19 1646     11/05/19 1600  vancomycin (VANCOCIN) IVPB 1000 mg/200 mL premix  Status:  Discontinued     1,000 mg 200 mL/hr over 60  Minutes Intravenous Every 12 hours 11/05/19 1504 11/10/19 0627   11/01/19 1400  vancomycin (VANCOREADY) IVPB 1500 mg/300 mL  Status:  Discontinued     1,500 mg 150 mL/hr over 120 Minutes Intravenous Every 12 hours 11/01/19 1305 11/05/19 0823   10/28/19 1000  vancomycin (VANCOREADY) IVPB 1500 mg/300 mL  Status:  Discontinued       1,500 mg 150 mL/hr over 120 Minutes Intravenous Every 12 hours 10/27/19 2353 11/01/19 1305   10/14/19 1000  vancomycin (VANCOREADY) IVPB 1250 mg/250 mL  Status:  Discontinued     1,250 mg 166.7 mL/hr over 90 Minutes Intravenous Every 12 hours 10/14/19 0540 10/27/19 2353   10/13/19 0945  Vancomycin (VANCOCIN) 1,250 mg in sodium chloride 0.9 % 250 mL IVPB  Status:  Discontinued     1,250 mg 166.7 mL/hr over 90 Minutes Intravenous Every 12 hours 10/13/19 0935 10/14/19 0540   10/13/19 0930  Vancomycin (VANCOCIN) 1,250 mg in sodium chloride 0.9 % 250 mL IVPB  Status:  Discontinued     1,250 mg 166.7 mL/hr over 90 Minutes Intravenous Every 12 hours 10/13/19 0902 10/13/19 0934   10/09/19 2000  vancomycin (VANCOCIN) IVPB 1000 mg/200 mL premix  Status:  Discontinued     1,000 mg 200 mL/hr over 60 Minutes Intravenous Every 12 hours 10/09/19 1121 10/13/19 0902   10/04/19 2200  vancomycin (VANCOCIN) IVPB 750 mg/150 ml premix  Status:  Discontinued     750 mg 150 mL/hr over 60 Minutes Intravenous Every 8 hours 10/04/19 1305 10/09/19 1121   10/04/19 1400  vancomycin (VANCOCIN) IVPB 1000 mg/200 mL premix     1,000 mg 200 mL/hr over 60 Minutes Intravenous STAT 10/04/19 1305 10/04/19 1604   10/01/19 1100  rifampin (RIFADIN) capsule 300 mg  Status:  Discontinued     300 mg Oral Every 12 hours 10/01/19 1056 11/09/19 1136   09/30/19 1800  Vancomycin (VANCOCIN) 1,250 mg in sodium chloride 0.9 % 250 mL IVPB  Status:  Discontinued     1,250 mg 166.7 mL/hr over 90 Minutes Intravenous Every 24 hours 09/30/19 0003 09/30/19 0747   09/30/19 1800  vancomycin (VANCOREADY) IVPB 1250 mg/250 mL  Status:  Discontinued     1,250 mg 166.7 mL/hr over 90 Minutes Intravenous Every 24 hours 09/30/19 0746 10/04/19 1305   09/30/19 0400  piperacillin-tazobactam (ZOSYN) IVPB 3.375 g  Status:  Discontinued     3.375 g 12.5 mL/hr over 240 Minutes Intravenous Every 8 hours 09/30/19 0003 09/30/19 1533   09/29/19 2145   piperacillin-tazobactam (ZOSYN) IVPB 3.375 g     3.375 g 12.5 mL/hr over 240 Minutes Intravenous STAT 09/29/19 2137 09/29/19 2148   09/29/19 2119  bacitracin 50,000 Units in sodium chloride 0.9 % 500 mL irrigation  Status:  Discontinued       As needed 09/29/19 2119 09/29/19 2249   09/29/19 2033  vancomycin (VANCOCIN) 1-5 GM/200ML-% IVPB    Note to Pharmacy: Ubaldo Glassing   : cabinet override      09/29/19 2033 09/30/19 0844      Subjective: Patient seen and examined the bedside this morning.  Hemodynamically stable.  No active issues.  Sitting comfortably on the chair and playing with phone.  objective: Vitals:   11/30/19 1326 11/30/19 2029 12/01/19 0317 12/01/19 0700  BP: 103/75 103/68 91/66 103/69  Pulse: (!) 108 95 86 100  Resp: 17 16 17 17   Temp: 97.8 F (36.6 C) 98.3 F (36.8 C) 98.5 F (36.9 C) 98.3 F (36.8 C)  TempSrc: Oral Oral Oral Oral  SpO2: 100% 100% 100% 100%  Weight:      Height:       No intake or output data in the 24 hours ending 12/01/19 1348 Filed Weights   09/29/19 2032  Weight: 49.9 kg    Examination:  General examination: Comfortable, no active issues Chest: No wheezes or crackles Abdomen: Soft, nontender, non distended Skin: No ulcers  Data Reviewed: I have personally reviewed following labs and imaging studies  CBC: Recent Labs  Lab 11/26/19 0402  WBC 6.3  NEUTROABS 4.3  HGB 8.5*  HCT 27.2*  MCV 86.3  PLT 312   Basic Metabolic Panel: Recent Labs  Lab 11/30/19 0950  NA 136  K 3.7  CL 103  CO2 23  GLUCOSE 182*  BUN 8  CREATININE 0.65  CALCIUM 8.3*   GFR: Estimated Creatinine Clearance: 74.4 mL/min (by C-G formula based on SCr of 0.65 mg/dL). Liver Function Tests: No results for input(s): AST, ALT, ALKPHOS, BILITOT, PROT, ALBUMIN in the last 168 hours. No results for input(s): LIPASE, AMYLASE in the last 168 hours. No results for input(s): AMMONIA in the last 168 hours. Coagulation Profile: No results for input(s):  INR, PROTIME in the last 168 hours. Cardiac Enzymes: No results for input(s): CKTOTAL, CKMB, CKMBINDEX, TROPONINI in the last 168 hours. BNP (last 3 results) No results for input(s): PROBNP in the last 8760 hours. HbA1C: No results for input(s): HGBA1C in the last 72 hours. CBG: No results for input(s): GLUCAP in the last 168 hours. Lipid Profile: No results for input(s): CHOL, HDL, LDLCALC, TRIG, CHOLHDL, LDLDIRECT in the last 72 hours. Thyroid Function Tests: No results for input(s): TSH, T4TOTAL, FREET4, T3FREE, THYROIDAB in the last 72 hours. Anemia Panel: No results for input(s): VITAMINB12, FOLATE, FERRITIN, TIBC, IRON, RETICCTPCT in the last 72 hours. Sepsis Labs: No results for input(s): PROCALCITON, LATICACIDVEN in the last 168 hours.  No results found for this or any previous visit (from the past 240 hour(s)).       Radiology Studies: No results found.      Scheduled Meds: . acetaminophen  1,000 mg Oral TID  . amphetamine-dextroamphetamine  30 mg Oral BID  . ARIPiprazole  20 mg Oral Daily  . buPROPion  150 mg Oral Daily  . Chlorhexidine Gluconate Cloth  6 each Topical Q0600  . diclofenac Sodium  4 g Topical QID  . feeding supplement (ENSURE ENLIVE)  237 mL Oral BID BM  . ferrous sulfate  325 mg Oral Q breakfast  . gabapentin  100 mg Oral TID  . influenza vac split quadrivalent PF  0.5 mL Intramuscular Tomorrow-1000  . multivitamin with minerals  1 tablet Oral Daily  . nicotine  14 mg Transdermal Daily  . rifabutin  300 mg Oral Daily  . rivaroxaban  20 mg Oral Daily  . senna-docusate  1 tablet Oral BID  . sodium chloride flush  10-40 mL Intracatheter Q12H   Continuous Infusions: . sodium chloride Stopped (11/18/19 1950)  . vancomycin 750 mg (12/01/19 1150)     LOS: 63 days    Time spent: 15 mins.More than 50% of that time was spent in counseling and/or coordination of care.      Burnadette Pop, MD [facility] P3/16/2021, 1:48 PM

## 2019-12-01 NOTE — Plan of Care (Signed)
  Problem: Coping: Goal: Level of anxiety will decrease Outcome: Progressing   Problem: Pain Managment: Goal: General experience of comfort will improve Outcome: Progressing   

## 2019-12-01 NOTE — Progress Notes (Signed)
Occupational Therapy Treatment Patient Details Name: Colleen Ramirez MRN: [id number] DOB: 1980-10-18 Today's Date: 12/01/2019    History of present illness Colleen Ramirez is a 39 y.o. F admitted to the ED with c/o severe neck pain secondary to a cervical epidural abscess. She is s/p C5-6 anterior cervical discectomy and instrumented fusion. PMH: IVDU, anemia, anxiety, depression, recent admission for facial cellulitis.    OT comments  Pt continues to progress toward established OT goals. She required minA for recall of UE HEP. Pt reports difficulty with doffing shirt overhead, educated pt on additional exercises to strengthen UE with adherence to precautions. Pt with questions regarding insurance and filing for disability, deferred questions to case management. Pt will continue to benefit from skilled OT services to maximize safety and independence with ADL/IADL and functional mobility. Will continue to follow acutely and progress as tolerated.    Follow Up Recommendations  No OT follow up    Equipment Recommendations  None recommended by OT    Recommendations for Other Services      Precautions / Restrictions Precautions Precautions: Cervical Precaution Booklet Issued: Yes (comment) Precaution Comments: pt with brace off upon arrival, reports typically wears during hallway ambulation Required Braces or Orthoses: Cervical Brace Cervical Brace: Soft collar;For comfort Restrictions Weight Bearing Restrictions: No       Mobility Bed Mobility Overal bed mobility: Independent                Transfers Overall transfer level: Independent                    Balance Overall balance assessment: Independent                                         ADL either performed or assessed with clinical judgement   ADL                                         General ADL Comments: pt able to take shirt off but reports difficulty and decreased  UE strength, once shoulders 90*     Vision       Perception     Praxis      Cognition Arousal/Alertness: Awake/alert Behavior During Therapy: WFL for tasks assessed/performed Overall Cognitive Status: Within Functional Limits for tasks assessed                                 General Comments: pt required minA for UE HEP;otherwise cognition wnl        Exercises Exercises: Other exercises;General Upper Extremity General Exercises - Upper Extremity Shoulder Flexion: AROM;Both;Standing(to 90*) Shoulder ABduction: AROM;Both;5 reps;Seated Shoulder Horizontal ABduction: AROM;Both;10 reps;Theraband Theraband Level (Shoulder Horizontal Abduction): Level 2 (Red) Shoulder Horizontal ADduction: AROM;Both;10 reps;Standing;Theraband Theraband Level (Shoulder Horizontal Adduction): Level 2 (Red) Elbow Flexion: AROM;Both;10 reps;Seated;Theraband Theraband Level (Elbow Flexion): Level 2 (Red) Elbow Extension: AROM;Both;10 reps;Standing;Theraband Theraband Level (Elbow Extension): Level 2 (Red) Hand Exercises Digit Composite Flexion: AROM;Both;10 reps;Seated Composite Extension: AROM;Both;10 reps;Seated Digit Composite Abduction: AROM;Both;10 reps;Supine Other Exercises Other Exercises: scapular retract and hold   Shoulder Instructions       General Comments      Pertinent Vitals/ Pain       Pain Assessment: No/denies pain  Pain Intervention(s): Monitored during session  Home Living                                          Prior Functioning/Environment              Frequency  Min 1X/week        Progress Toward Goals  OT Goals(current goals can now be found in the care plan section)  Progress towards OT goals: Goals met and updated - see care plan  Acute Rehab OT Goals Patient Stated Goal: get strength back in BUE OT Goal Formulation: With patient Time For Goal Achievement: 12/07/19 Potential to Achieve Goals: Good ADL  Goals Additional ADL Goal #1: Pt will perform BUE HEP with level 3 theraband independently (bicep curl, horizontal add/abduction (bilateral and unilateral))  Plan Discharge plan remains appropriate;Frequency remains appropriate    Co-evaluation                 AM-PAC OT "6 Clicks" Daily Activity     Outcome Measure   Help from another person eating meals?: None Help from another person taking care of personal grooming?: None Help from another person toileting, which includes using toliet, bedpan, or urinal?: None Help from another person bathing (including washing, rinsing, drying)?: None Help from another person to put on and taking off regular upper body clothing?: None Help from another person to put on and taking off regular lower body clothing?: None 6 Click Score: 24    End of Session    OT Visit Diagnosis: Muscle weakness (generalized) (M62.81);Pain Pain - Right/Left: Right Pain - part of body: Shoulder;Arm   Activity Tolerance Patient tolerated treatment well   Patient Left with call bell/phone within reach   Nurse Communication Mobility status        Time: 9147-8295 OT Time Calculation (min): 24 min  Charges: OT General Charges $OT Visit: 1 Visit OT Treatments $Self Care/Home Management : 8-22 mins $Therapeutic Exercise: 8-22 mins  Helene Kelp OTR/L Acute Rehabilitation Services Office: [place] 12/01/2019, 3:15 PM

## 2019-12-01 NOTE — Plan of Care (Signed)
  Problem: Pain Managment: Goal: General experience of comfort will improve Outcome: Progressing   

## 2019-12-02 NOTE — Plan of Care (Signed)
  Problem: Pain Managment: Goal: General experience of comfort will improve Outcome: Progressing   

## 2019-12-02 NOTE — Progress Notes (Signed)
[name]  PROGRESS NOTE    Colleen Ramirez  BJY:[id number] DOB: Dec 05, 1980 DOA: 09/29/2019 PCP: Patient, No Pcp Per   Brief Narrative:   Patient is a 38 year old female with history of IV drug abuse, anxiety, depression, anemia, status post gastric bypass who presented to the emergency department on 1/12 with complaints of 2-day history of sharp  neck pain.  She was recently hospitalized from 12/20-2/24 for facial cellulitis.  MRI cervical spine showed discitis, osteomyelitis at C5-C6 and C6-7.  Found to have prevertebral/retropharyngeal soft tissue phlegmonous inflammation with areas of nonenhancing material consistent with pus.  Underwent C5-C6 anterior discectomy, instrumented fusion cultures positive for MRSA.  Started on vancomycin, rifampin.  Follow-up cervical MRI showed progression of findings consistent with discitis/osteomyelitis involving C4, C5, C6 and C7 vertebrae.  Neurosurgery and ID recommended to continue aggressive medical therapy.  Current plan is to continue IV antibiotics till March 26.  12/02/19: Abx through 3/26. Labwork looks good. She will need follow up at d/c but she says she's moving to [facility]. CM consulted. Continue to wean narcotics. Has PRN flexeril, valium, and dilaudid. Will not increase medication as she was seen walking around hall without incident earlier.   Assessment & Plan:   Principal Problem:   Abscess in epidural space of cervical spine Active Problems:   Depression   Acquired fusion of cervical spine   IVDU (intravenous drug user)   Homeless   S/P cervical discectomy   Anxiety   Status post gastric bypass for obesity   Hepatitis C antibody positive in blood   Acute bilateral thoracic back pain   Body aches   Methamphetamine abuse (HCC)   Pain of right sternoclavicular joint  Cervical spine epidural abscess with vertebral osteomyelitis     - Status post C4-C7 fusion     - continue IV antibiotics till March 26.     - Neurosurgery and ID were following.      - Pain management w/ oral PRN diluadid; PRN valium/flexeril for spasm     - 3/17: continue abx, continue narcotic wean     - CM consulted for help with PCP/follow up   History of IV drug abuse     - Needs follow up at pain management clinic / drug rehabilitation  Hepatitis C     - likely secondary to above     - Needs to follow-up as an outpatient for hepatitis C management.    Bilateral upper extremity DVT     - Ultrasound showed right upper extremity superficial and deep venous thrombosis of cephalic and brachial veins and right superficial vein thrombosis of basilic and cephalic vein.       - continue Xarelto  Depression/anxiety     - continue Adderall, aripiprazole, trazodone, bupropion.  Chronic normocytic anemia     - Iron studies showed iron deficiency; on oral iron replacement     - AM labs ordered   Tobacco abuse     - nicotine patch     - advised against any further drug and tobacco use  DVT prophylaxis: xarelto Code Status: FULL Family Communication: None at bedside   Disposition Plan: Continue abx through 3/26; then discharge to home.   Consultants:   Neurosurgery  ID  Procedures:   C4-C7 fusion  Antimicrobials:  . Vancomycin, rifabutin   ROS:  Reports neck pain . Remainder 10-pt ROS is negative for all not previously mentioned.  Subjective: "My neck hurts."  Objective: Vitals:   12/02/19 0500 12/02/19 0543 12/02/19  0800 12/02/19 1319  BP: 98/72 (!) 93/58 102/76 108/78  Pulse: 99 100 (!) 108 (!) 106  Resp: 17 16 18 15   Temp: 98 F (36.7 C) 98.7 F (37.1 C) 98.7 F (37.1 C) 98.6 F (37 C)  TempSrc: Oral Oral Oral Oral  SpO2: 98% 98% 99% 100%  Weight:      Height:        Intake/Output Summary (Last 24 hours) at 12/02/2019 1604 Last data filed at 12/02/2019 1300 Gross per 24 hour  Intake 720 ml  Output --  Net 720 ml   Filed Weights   09/29/19 2032  Weight: 49.9 kg    Examination:  General: 39 y.o. female standing at bedside  and walking hallways in NAD Cardiovascular: RRR, +S1, S2, no m/g/r, equal pulses throughout Respiratory: CTABL, no w/r/r, normal WOB MSK: No e/c/c Skin: No rashes, bruises, ulcerations noted Neuro: alert to name, follows commands Pysc: anxiety, pressured speech, largely cooperative  Data Reviewed: I have personally reviewed following labs and imaging studies.  CBC: Recent Labs  Lab 11/26/19 0402  WBC 6.3  NEUTROABS 4.3  HGB 8.5*  HCT 27.2*  MCV 86.3  PLT 161   Basic Metabolic Panel: Recent Labs  Lab 11/30/19 0950  NA 136  K 3.7  CL 103  CO2 23  GLUCOSE 182*  BUN 8  CREATININE 0.65  CALCIUM 8.3*   GFR: Estimated Creatinine Clearance: 74.4 mL/min (by C-G formula based on SCr of 0.65 mg/dL). Liver Function Tests: No results for input(s): AST, ALT, ALKPHOS, BILITOT, PROT, ALBUMIN in the last 168 hours. No results for input(s): LIPASE, AMYLASE in the last 168 hours. No results for input(s): AMMONIA in the last 168 hours. Coagulation Profile: No results for input(s): INR, PROTIME in the last 168 hours. Cardiac Enzymes: No results for input(s): CKTOTAL, CKMB, CKMBINDEX, TROPONINI in the last 168 hours. BNP (last 3 results) No results for input(s): PROBNP in the last 8760 hours. HbA1C: No results for input(s): HGBA1C in the last 72 hours. CBG: No results for input(s): GLUCAP in the last 168 hours. Lipid Profile: No results for input(s): CHOL, HDL, LDLCALC, TRIG, CHOLHDL, LDLDIRECT in the last 72 hours. Thyroid Function Tests: No results for input(s): TSH, T4TOTAL, FREET4, T3FREE, THYROIDAB in the last 72 hours. Anemia Panel: No results for input(s): VITAMINB12, FOLATE, FERRITIN, TIBC, IRON, RETICCTPCT in the last 72 hours. Sepsis Labs: No results for input(s): PROCALCITON, LATICACIDVEN in the last 168 hours.  No results found for this or any previous visit (from the past 240 hour(s)).    Radiology Studies: No results found.   Scheduled Meds: . acetaminophen   1,000 mg Oral TID  . amphetamine-dextroamphetamine  30 mg Oral BID  . ARIPiprazole  20 mg Oral Daily  . buPROPion  150 mg Oral Daily  . Chlorhexidine Gluconate Cloth  6 each Topical Q0600  . diclofenac Sodium  4 g Topical QID  . feeding supplement (ENSURE ENLIVE)  237 mL Oral BID BM  . ferrous sulfate  325 mg Oral Q breakfast  . gabapentin  100 mg Oral TID  . influenza vac split quadrivalent PF  0.5 mL Intramuscular Tomorrow-1000  . multivitamin with minerals  1 tablet Oral Daily  . nicotine  14 mg Transdermal Daily  . rifabutin  300 mg Oral Daily  . rivaroxaban  20 mg Oral Daily  . senna-docusate  1 tablet Oral BID  . sodium chloride flush  10-40 mL Intracatheter Q12H   Continuous Infusions: . sodium chloride  Stopped (11/18/19 1950)  . vancomycin 750 mg (12/02/19 0931)     LOS: 64 days    Time spent: 25 minutes spent in the coordination of care today.    Teddy Spike, DO [facility]  If 7PM-7AM, please contact night-coverage [email or web address] 12/02/2019, 4:04 PM

## 2019-12-03 LAB — CBC WITH DIFFERENTIAL/PLATELET
Abs Immature Granulocytes: 0.01 10*3/uL (ref 0.00–0.07)
Basophils Absolute: 0.1 10*3/uL (ref 0.0–0.1)
Basophils Relative: 1 %
Eosinophils Absolute: 0.2 10*3/uL (ref 0.0–0.5)
Eosinophils Relative: 5 %
HCT: 26.4 % — ABNORMAL LOW (ref 36.0–46.0)
Hemoglobin: 8.2 g/dL — ABNORMAL LOW (ref 12.0–15.0)
Immature Granulocytes: 0 %
Lymphocytes Relative: 31 %
Lymphs Abs: 1.3 10*3/uL (ref 0.7–4.0)
MCH: 26.3 pg (ref 26.0–34.0)
MCHC: 31.1 g/dL (ref 30.0–36.0)
MCV: 84.6 fL (ref 80.0–100.0)
Monocytes Absolute: 0.6 10*3/uL (ref 0.1–1.0)
Monocytes Relative: 14 %
Neutro Abs: 2.1 10*3/uL (ref 1.7–7.7)
Neutrophils Relative %: 49 %
Platelets: 340 10*3/uL (ref 150–400)
RBC: 3.12 MIL/uL — ABNORMAL LOW (ref 3.87–5.11)
RDW: 13.4 % (ref 11.5–15.5)
WBC: 4.2 10*3/uL (ref 4.0–10.5)
nRBC: 0 % (ref 0.0–0.2)

## 2019-12-03 LAB — COMPREHENSIVE METABOLIC PANEL
ALT: 22 U/L (ref 0–44)
AST: 32 U/L (ref 15–41)
Albumin: 2.5 g/dL — ABNORMAL LOW (ref 3.5–5.0)
Alkaline Phosphatase: 76 U/L (ref 38–126)
Anion gap: 10 (ref 5–15)
BUN: 12 mg/dL (ref 6–20)
CO2: 26 mmol/L (ref 22–32)
Calcium: 8.6 mg/dL — ABNORMAL LOW (ref 8.9–10.3)
Chloride: 102 mmol/L (ref 98–111)
Creatinine, Ser: 0.52 mg/dL (ref 0.44–1.00)
GFR calc Af Amer: >60 mL/min (ref >60)
GFR calc non Af Amer: >60 mL/min (ref >60)
Glucose, Bld: 102 mg/dL — ABNORMAL HIGH (ref 70–99)
Potassium: 3.9 mmol/L (ref 3.5–5.1)
Sodium: 138 mmol/L (ref 135–145)
Total Bilirubin: 0.5 mg/dL (ref 0.3–1.2)
Total Protein: 6.4 g/dL — ABNORMAL LOW (ref 6.5–8.1)

## 2019-12-03 LAB — MAGNESIUM: Magnesium: 1.8 mg/dL (ref 1.7–2.4)

## 2019-12-03 LAB — VANCOMYCIN, PEAK: Vancomycin Pk: 36 ug/mL (ref 30–40)

## 2019-12-03 NOTE — Plan of Care (Signed)
  Problem: Clinical Measurements: Goal: Will remain free from infection Outcome: Progressing Goal: Diagnostic test results will improve Outcome: Progressing Goal: Cardiovascular complication will be avoided Outcome: Progressing   Problem: Nutrition: Goal: Adequate nutrition will be maintained Outcome: Progressing   

## 2019-12-03 NOTE — Progress Notes (Signed)
[name]  PROGRESS NOTE    Colleen Ramirez  TDV:[id number] DOB: [birth year] DOA: 09/29/2019 PCP: Patient, No Pcp Per   Brief Narrative:   Patient is a 39 year old female with history of IV drug abuse, anxiety, depression, anemia, status post gastric bypass who presented to the emergency department on 1/12 with complaints of 2-day history of sharp neck pain. She was recently hospitalized from 12/20-2/24 for facial cellulitis. MRI cervical spine showed discitis, osteomyelitis at C5-C6 and C6-7. Found to have prevertebral/retropharyngeal soft tissue phlegmonous inflammation with areas of nonenhancing material consistent with pus. Underwent C5-C6 anterior discectomy, instrumented fusion cultures positive for MRSA. Started on vancomycin, rifampin. Follow-up cervical MRI showed progression of findings consistent with discitis/osteomyelitis involving C4, C5, C6 and C7 vertebrae. Neurosurgery and ID recommended to continue aggressive medical therapy. Current plan is to continue IV antibiotics till March 26.  12/03/19: Pt again expresses her displeasure with current pain regimen. States that we are implying that she is trying to get high. Informed her she has 2 antispasmodics, a medicine for neuropathic pain, as well as dilaudid. However, she is only availing herself of the dilaudid and the gabapentin (gabapentin is scheduled). She states that she can not remember to ask for the flexeril and she takes valium to anxiety. These meds as well as diluadid are PRN. I've pointed out that she doesn't have difficulty remembering to ask for diluadid. I recommended to her that she take all the PRN as prescribed to work on her pain control. I have instructed nursing to remind her throughout the day of the availability of her antispasmodics in addition to her dilaudid. They have agreed to assist in this matter. She insists that she needs an MRI at this point because we are overlooking her pain. When I point out that she has been  see walking around the hallways without incident and playing on her phone, she argues that she remains walking in pain. She has a CRP up for tomorrow. Continuing current regimen.   Assessment & Plan:   Principal Problem:   Abscess in epidural space of cervical spine Active Problems:   Depression   Acquired fusion of cervical spine   IVDU (intravenous drug user)   Homeless   S/P cervical discectomy   Anxiety   Status post gastric bypass for obesity   Hepatitis C antibody positive in blood   Acute bilateral thoracic back pain   Body aches   Methamphetamine abuse (HCC)   Pain of right sternoclavicular joint  Cervical spine epidural abscess with vertebral osteomyelitis     - Status post C4-C7 fusion     - continue IV antibiotics till March 26.     - Neurosurgery and ID were following.     - Pain management w/ oral PRN diluadid; PRN valium/flexeril for spasm     - 3/17: continue abx, continue narcotic wean     - CM consulted for help with PCP/follow up     - 3/18: continue abx, continue narcotic wean; she will need PCP follow up but she states she is moving to [facility]. Not much that we can do to help on that front. IV abx end on 3/26. She will be inpt until then.  History of IV drug abuse     - Needs follow up at pain management clinic/drug rehabilitation  Hepatitis C     - likely secondary to above     - Needs to follow-up as an outpatient for hepatitis C management.    Bilateral upper  extremity DVT     - Ultrasound showed right upper extremity superficial and deep venous thrombosis of cephalic and brachial veins and right superficial vein thrombosis of basilic and cephalic vein.       - continue Xarelto  Depression/anxiety     - continue Adderall, aripiprazole, trazodone, bupropion.  Chronic normocytic anemia     - Iron studies showed iron deficiency; on oral iron replacement     - 3/18: Hgb is stable; no evidence of new bleed, monitor  Tobacco abuse     - nicotine  patch     - advised against any further drug and tobacco use  DVT prophylaxis: xarelto Code Status: FULL Family Communication: None at bedside   Disposition Plan: Continue abx through 3/26; then discharge to home.   Consultants:   Neurosurgery  ID  Procedures:   C4-C7 fusion  Antimicrobials:  . Vancomycin, rifabutin   ROS:  Reports neck pain . Remainder 10-pt ROS is negative for all not previously mentioned.  Subjective: "I need an MRI. Y'all were wrong before."  Objective: Vitals:   12/02/19 1319 12/02/19 1955 12/03/19 0501 12/03/19 0758  BP: 108/78 104/77 96/72 97/73   Pulse: (!) 106 (!) 103 85 98  Resp: 15 17 16 17   Temp: 98.6 F (37 C) (!) 97.4 F (36.3 C)  98.4 F (36.9 C)  TempSrc: Oral Oral  Oral  SpO2: 100% 98% 100% 98%  Weight:      Height:       No intake or output data in the 24 hours ending 12/03/19 1330 Filed Weights   09/29/19 2032  Weight: 49.9 kg    Examination:  General: 39 y.o. female resting on couch in NAD Cardiovascular: RRR, +S1, S2, no m/g/r, equal pulses throughout Respiratory: CTABL, no w/r/r, normal WOB GI: BS+, NDNT, no masses noted, no organomegaly noted MSK: No e/c/c Neuro: A&O x 3, no focal deficits  Data Reviewed: I have personally reviewed following labs and imaging studies.  CBC: Recent Labs  Lab 12/03/19 0400  WBC 4.2  NEUTROABS 2.1  HGB 8.2*  HCT 26.4*  MCV 84.6  PLT 340   Basic Metabolic Panel: Recent Labs  Lab 11/30/19 0950 12/03/19 0400  NA 136 138  K 3.7 3.9  CL 103 102  CO2 23 26  GLUCOSE 182* 102*  BUN 8 12  CREATININE 0.65 0.52  CALCIUM 8.3* 8.6*  MG  --  1.8   GFR: Estimated Creatinine Clearance: 74.4 mL/min (by C-G formula based on SCr of 0.52 mg/dL). Liver Function Tests: Recent Labs  Lab 12/03/19 0400  AST 32  ALT 22  ALKPHOS 76  BILITOT 0.5  PROT 6.4*  ALBUMIN 2.5*   No results for input(s): LIPASE, AMYLASE in the last 168 hours. No results for input(s): AMMONIA in the last  168 hours. Coagulation Profile: No results for input(s): INR, PROTIME in the last 168 hours. Cardiac Enzymes: No results for input(s): CKTOTAL, CKMB, CKMBINDEX, TROPONINI in the last 168 hours. BNP (last 3 results) No results for input(s): PROBNP in the last 8760 hours. HbA1C: No results for input(s): HGBA1C in the last 72 hours. CBG: No results for input(s): GLUCAP in the last 168 hours. Lipid Profile: No results for input(s): CHOL, HDL, LDLCALC, TRIG, CHOLHDL, LDLDIRECT in the last 72 hours. Thyroid Function Tests: No results for input(s): TSH, T4TOTAL, FREET4, T3FREE, THYROIDAB in the last 72 hours. Anemia Panel: No results for input(s): VITAMINB12, FOLATE, FERRITIN, TIBC, IRON, RETICCTPCT in the last 72 hours. Sepsis  Labs: No results for input(s): PROCALCITON, LATICACIDVEN in the last 168 hours.  No results found for this or any previous visit (from the past 240 hour(s)).    Radiology Studies: No results found.   Scheduled Meds: . acetaminophen  1,000 mg Oral TID  . amphetamine-dextroamphetamine  30 mg Oral BID  . ARIPiprazole  20 mg Oral Daily  . buPROPion  150 mg Oral Daily  . Chlorhexidine Gluconate Cloth  6 each Topical Q0600  . diclofenac Sodium  4 g Topical QID  . feeding supplement (ENSURE ENLIVE)  237 mL Oral BID BM  . ferrous sulfate  325 mg Oral Q breakfast  . gabapentin  100 mg Oral TID  . influenza vac split quadrivalent PF  0.5 mL Intramuscular Tomorrow-1000  . multivitamin with minerals  1 tablet Oral Daily  . nicotine  14 mg Transdermal Daily  . rifabutin  300 mg Oral Daily  . rivaroxaban  20 mg Oral Daily  . senna-docusate  1 tablet Oral BID  . sodium chloride flush  10-40 mL Intracatheter Q12H   Continuous Infusions: . sodium chloride Stopped (11/18/19 1950)  . vancomycin 750 mg (12/03/19 1027)     LOS: 65 days    Time spent: 25 minutes spent in the coordination of care today.    Jonnie Finner, DO [facility]  If 7PM-7AM, please  contact night-coverage [email or web address] 12/03/2019, 1:30 PM

## 2019-12-04 ENCOUNTER — Inpatient Hospital Stay (HOSPITAL_COMMUNITY): Payer: Self-pay

## 2019-12-04 LAB — COMPREHENSIVE METABOLIC PANEL
ALT: 24 U/L (ref 0–44)
AST: 37 U/L (ref 15–41)
Albumin: 2.3 g/dL — ABNORMAL LOW (ref 3.5–5.0)
Alkaline Phosphatase: 77 U/L (ref 38–126)
Anion gap: 8 (ref 5–15)
BUN: 8 mg/dL (ref 6–20)
CO2: 26 mmol/L (ref 22–32)
Calcium: 8.4 mg/dL — ABNORMAL LOW (ref 8.9–10.3)
Chloride: 105 mmol/L (ref 98–111)
Creatinine, Ser: 0.53 mg/dL (ref 0.44–1.00)
GFR calc Af Amer: >60 mL/min (ref >60)
GFR calc non Af Amer: >60 mL/min (ref >60)
Glucose, Bld: 101 mg/dL — ABNORMAL HIGH (ref 70–99)
Potassium: 3.9 mmol/L (ref 3.5–5.1)
Sodium: 139 mmol/L (ref 135–145)
Total Bilirubin: 0.5 mg/dL (ref 0.3–1.2)
Total Protein: 6.3 g/dL — ABNORMAL LOW (ref 6.5–8.1)

## 2019-12-04 LAB — CBC WITH DIFFERENTIAL/PLATELET
Abs Immature Granulocytes: 0.01 10*3/uL (ref 0.00–0.07)
Basophils Absolute: 0.1 10*3/uL (ref 0.0–0.1)
Basophils Relative: 1 %
Eosinophils Absolute: 0.2 10*3/uL (ref 0.0–0.5)
Eosinophils Relative: 4 %
HCT: 29.5 % — ABNORMAL LOW (ref 36.0–46.0)
Hemoglobin: 9 g/dL — ABNORMAL LOW (ref 12.0–15.0)
Immature Granulocytes: 0 %
Lymphocytes Relative: 28 %
Lymphs Abs: 1.4 10*3/uL (ref 0.7–4.0)
MCH: 26 pg (ref 26.0–34.0)
MCHC: 30.5 g/dL (ref 30.0–36.0)
MCV: 85.3 fL (ref 80.0–100.0)
Monocytes Absolute: 0.5 10*3/uL (ref 0.1–1.0)
Monocytes Relative: 11 %
Neutro Abs: 2.8 10*3/uL (ref 1.7–7.7)
Neutrophils Relative %: 56 %
Platelets: 374 10*3/uL (ref 150–400)
RBC: 3.46 MIL/uL — ABNORMAL LOW (ref 3.87–5.11)
RDW: 13.5 % (ref 11.5–15.5)
WBC: 5 10*3/uL (ref 4.0–10.5)
nRBC: 0 % (ref 0.0–0.2)

## 2019-12-04 LAB — VANCOMYCIN, PEAK: Vancomycin Pk: 16 ug/mL — ABNORMAL LOW (ref 30–40)

## 2019-12-04 LAB — C-REACTIVE PROTEIN: CRP: 4.5 mg/dL — ABNORMAL HIGH (ref <1.0)

## 2019-12-04 LAB — VANCOMYCIN, TROUGH: Vancomycin Tr: <4 ug/mL — ABNORMAL LOW (ref 15–20)

## 2019-12-04 LAB — MAGNESIUM: Magnesium: 1.8 mg/dL (ref 1.7–2.4)

## 2019-12-04 MED ORDER — GADOBUTROL 1 MMOL/ML IV SOLN
5.0000 mL | Freq: Once | INTRAVENOUS | Status: AC | PRN
  Administered 2019-12-04: 5 mL via INTRAVENOUS

## 2019-12-04 MED ORDER — HYDROMORPHONE HCL 2 MG PO TABS
4.0000 mg | ORAL_TABLET | Freq: Four times a day (QID) | ORAL | Status: DC | PRN
  Administered 2019-12-04 – 2019-12-11 (×29): 4 mg via ORAL
  Filled 2019-12-04 (×29): qty 2

## 2019-12-04 MED ORDER — VANCOMYCIN HCL IN DEXTROSE 1-5 GM/200ML-% IV SOLN
1000.0000 mg | Freq: Two times a day (BID) | INTRAVENOUS | Status: AC
  Administered 2019-12-05 – 2019-12-11 (×14): 1000 mg via INTRAVENOUS
  Filled 2019-12-04 (×16): qty 200

## 2019-12-04 MED ORDER — LORATADINE 10 MG PO TABS
10.0000 mg | ORAL_TABLET | Freq: Every day | ORAL | Status: DC
  Administered 2019-12-04 – 2019-12-11 (×8): 10 mg via ORAL
  Filled 2019-12-04 (×9): qty 1

## 2019-12-04 MED ORDER — VANCOMYCIN HCL IN DEXTROSE 1-5 GM/200ML-% IV SOLN
1000.0000 mg | INTRAVENOUS | Status: AC
  Administered 2019-12-04: 1000 mg via INTRAVENOUS
  Filled 2019-12-04: qty 200

## 2019-12-04 MED ORDER — DM-GUAIFENESIN ER 30-600 MG PO TB12
1.0000 | ORAL_TABLET | Freq: Two times a day (BID) | ORAL | Status: DC
  Administered 2019-12-04 – 2019-12-11 (×15): 1 via ORAL
  Filled 2019-12-04 (×15): qty 1

## 2019-12-04 NOTE — Progress Notes (Signed)
Pharmacy Antibiotic Note  Colleen Ramirez is a 39 y.o. female admitted on 09/29/2019 with MRSA cervical epidural abscess/osteo.  Pharmacy has been consulted for Vancomycin dosing.  ID: Abx for MRSA cervical epidural abscess/osteo now s/p surgical decompression, discectomy and fusion 1/12. Echo negative for vegetations. 6 weeks of abx. No PICC line due to PSA. Discharge on doxycycline/rifabutin  (rifampn chg'd to rifabutin d/t starting Xarelto) - 2/14: MRI: progression of discitis/osteo of C4-C7. Persistent ventral greater than dorsal epidural phlegmon spanning the C2-C7 levels, persistent vertebral, retropharyngeal phlegmon  - afebrile, WBC WNL, Scr WNL  3/19 update:  VP 16 mcg/ml, VT <4 mcg/ml - appears drawn appropriately AUC unable to be calculated Renal function has been stable, SCr 0.5-0.6s  1/12 Zosyn>>1/13 1/12 Vanc >> (3/26) 1/14 Rifampin switched 2/22 to Rifabutin d/t DOAC>> (3/26)  Vanc levels: 2/23: VP 17, VT 8 >> AUC 386, (on 1g q12h)>change 1250 Q12 for calc'd AUC 480 2/25: Dose not charted 2/25 PM (SZP done) 2/28: VP not drawn, levels reordered  3/1: AUC 424, cont 1250 mg IV q12h 3/8  VT= 12,  VP=30, AUC 482.5, cont same 1250 q12h 3/15: AUC 735, dec to 750 mg IV q12h 3/18: VP: 36, VT n/a d/t RN giving dose before VT drawn 3/18 VP 16 @2346 , VT <4, incr 1000 mg q12h  1/12 cervical abscess: abundant MRSA, MIC to Vanc < 0.5 1/14 HCV+ 1/14 MRSA PCR positive 2/15 blood x 2: neg   Plan: -Increase vancomycin to 1000 mg IV q12h -Re-check levels as needed  -IV abx stop date 3/26   Height: 5\' 3"  (160 cm) Weight: 110 lb (49.9 kg) IBW/kg (Calculated) : 52.4  Temp (24hrs), Avg:98 F (36.7 C), Min:97.8 F (36.6 C), Max:98.3 F (36.8 C)  Recent Labs  Lab 11/29/19 2053 11/30/19 0508 11/30/19 0950 12/03/19 0400 12/03/19 1149 12/03/19 2346 12/04/19 0245 12/04/19 0601 12/04/19 0941  WBC  --   --   --  4.2  --   --   --  5.0  --   CREATININE  --   --  0.65 0.52  --    --  0.53  --   --   VANCOTROUGH  --  20  --   --   --   --   --   --  <4*  VANCOPEAK   < >  --   --   --  36 16*  --   --   --    < > = values in this interval not displayed.    Estimated Creatinine Clearance: 74.4 mL/min (by C-G formula based on SCr of 0.53 mg/dL).    No Known Allergies  Thank you for involving pharmacy in this patient's care.  12/06/19, PharmD, BCPS Clinical Pharmacist Clinical phone for 12/04/2019 until 3p is Loura Back 12/04/2019 11:21 AM  **Pharmacist phone directory can be found on [email or web address] listed under [facility]**

## 2019-12-04 NOTE — Plan of Care (Signed)
  Problem: Pain Managment: Goal: General experience of comfort will improve Outcome: Progressing   Problem: Safety: Goal: Ability to remain free from injury will improve Outcome: Progressing   Problem: Skin Integrity: Goal: Risk for impaired skin integrity will decrease Outcome: Progressing   

## 2019-12-04 NOTE — Progress Notes (Signed)
[name] Kitchen  PROGRESS NOTE    Colleen Ramirez  SUP:[id number] DOB: 06/09/1981 DOA: 09/29/2019 PCP: Patient, No Pcp Per   Brief Narrative:   Patient is a 39 year old female with history of IV drug abuse, anxiety, depression, anemia, status post gastric bypass who presented to the emergency department on 1/12 with complaints of 2-day history of sharp neck pain. She was recently hospitalized from 12/20-2/24 for facial cellulitis. MRI cervical spine showed discitis, osteomyelitis at C5-C6 and C6-7. Found to have prevertebral/retropharyngeal soft tissue phlegmonous inflammation with areas of nonenhancing material consistent with pus. Underwent C5-C6 anterior discectomy, instrumented fusion cultures positive for MRSA. Started on vancomycin, rifampin. Follow-up cervical MRI showed progression of findings consistent with discitis/osteomyelitis involving C4, C5, C6 and C7 vertebrae. Neurosurgery and ID recommended to continue aggressive medical therapy. Current plan is to continue IV antibiotics till March 26.  12/04/19: When she availed herself of all available medications for pain control, her diluadid use actually decreased. However, she states that it makes her sleepy. I informed her that this is a known side effect. I informed her that she would be better off with long acting MS Contin and something for breakthrough. She states that MS contin makes her sleepy. I informed her that she is taking short acting medications and they won't provide the coverage she needs. She requested trying a higher dose (4mg ) and an increased frequency (in this case 6 hrs). I agreed to attempt this regimen, but again reiterated that she needs a longer acting regimen. CRP is up a little today. Will repeat MRI. Last one (3/1) was stable. Vanc troughs are low. She will be getting a bump in the vanc starting today. C/o congestion, adding claritin and mucinex DM. Has ocean nasal spray as well.    Assessment & Plan:   Principal  Problem:   Abscess in epidural space of cervical spine Active Problems:   Depression   Acquired fusion of cervical spine   IVDU (intravenous drug user)   Homeless   S/P cervical discectomy   Anxiety   Status post gastric bypass for obesity   Hepatitis C antibody positive in blood   Acute bilateral thoracic back pain   Body aches   Methamphetamine abuse (HCC)   Pain of right sternoclavicular joint  Cervical spine epidural abscess with vertebral osteomyelitis - Status post C4-C7 fusion - continue IV antibiotics till March 26. - Neurosurgery and ID were following. - Pain management w/ oral PRN diluadid; PRN valium/flexeril for spasm - 3/17: continue abx, continue narcotic wean - CM consulted for help with PCP/follow up     - 3/18: continue abx, continue narcotic wean; she will need PCP follow up but she states she is moving to [facility]. Not much that we can do to help on that front. IV abx end on 3/26. She will be inpt until then.     - 3/19: as above; check rpt MRI  History of IV drug abuse - Needs follow up at pain management clinic/drug rehabilitation  Hepatitis C - likely secondary to above - Needs to follow-up as an outpatient for hepatitis C management.   Bilateral upper extremity DVT - Ultrasound showed right upper extremity superficial and deep venous thrombosis of cephalic and brachial veins and right superficial vein thrombosis of basilic and cephalic vein.  - continue Xarelto  Depression/anxiety - continue Adderall, aripiprazole, trazodone, bupropion.  Chronic normocytic anemia - Iron studies showed iron deficiency; on oral iron replacement     - 3/18: Hgb is stable; no  evidence of new bleed, monitor  Tobacco abuse - nicotine patch - advised against any further drug and tobacco use  Congestion     - claritin, mucinex DM, ocean nasal spray3  DVT prophylaxis: xarelto Code Status: FULL Family  Communication: None at bedside   Disposition Plan: Continue IV abx through 3/26; then discharge to home on PO meds  Consultants:   Neurosurgery  ID  Procedures:   C4-C7 fusion  Antimicrobials:  . Vancomycin, rifabutin   ROS:  Reports congestion, neck pain. Remainder 10-pt ROS is negative for all not previously mentioned.  Subjective: "I'm withdrawing."  Objective: Vitals:   12/03/19 1959 12/03/19 2300 12/04/19 0544 12/04/19 0739  BP: 91/64  101/76 103/77  Pulse: 98  (!) 108 98  Resp: 15  15 18   Temp: 97.8 F (36.6 C)  98.2 F (36.8 C) 98.3 F (36.8 C)  TempSrc: Oral  Oral Oral  SpO2: 99% 99% 100% 100%  Weight:      Height:        Intake/Output Summary (Last 24 hours) at 12/04/2019 1329 Last data filed at 12/04/2019 1100 Gross per 24 hour  Intake 260 ml  Output 1 ml  Net 259 ml   Filed Weights   09/29/19 2032  Weight: 49.9 kg    Examination:  General: 39 y.o. female standing at side of bed in NAD Cardiovascular: RRR, +S1, S2, no m/g/r, equal pulses throughout Respiratory: CTABL, no w/r/r, normal WOB GI: BS+, NDNT, no masses noted, no organomegaly noted MSK: No e/c/c Neuro: A&O x 3, no focal deficits Psyc: calm/cooperative   Data Reviewed: I have personally reviewed following labs and imaging studies.  CBC: Recent Labs  Lab 12/03/19 0400 12/04/19 0601  WBC 4.2 5.0  NEUTROABS 2.1 2.8  HGB 8.2* 9.0*  HCT 26.4* 29.5*  MCV 84.6 85.3  PLT 340 374   Basic Metabolic Panel: Recent Labs  Lab 11/30/19 0950 12/03/19 0400 12/04/19 0245  NA 136 138 139  K 3.7 3.9 3.9  CL 103 102 105  CO2 23 26 26   GLUCOSE 182* 102* 101*  BUN 8 12 8   CREATININE 0.65 0.52 0.53  CALCIUM 8.3* 8.6* 8.4*  MG  --  1.8 1.8   GFR: Estimated Creatinine Clearance: 74.4 mL/min (by C-G formula based on SCr of 0.53 mg/dL). Liver Function Tests: Recent Labs  Lab 12/03/19 0400 12/04/19 0245  AST 32 37  ALT 22 24  ALKPHOS 76 77  BILITOT 0.5 0.5  PROT 6.4* 6.3*   ALBUMIN 2.5* 2.3*   No results for input(s): LIPASE, AMYLASE in the last 168 hours. No results for input(s): AMMONIA in the last 168 hours. Coagulation Profile: No results for input(s): INR, PROTIME in the last 168 hours. Cardiac Enzymes: No results for input(s): CKTOTAL, CKMB, CKMBINDEX, TROPONINI in the last 168 hours. BNP (last 3 results) No results for input(s): PROBNP in the last 8760 hours. HbA1C: No results for input(s): HGBA1C in the last 72 hours. CBG: No results for input(s): GLUCAP in the last 168 hours. Lipid Profile: No results for input(s): CHOL, HDL, LDLCALC, TRIG, CHOLHDL, LDLDIRECT in the last 72 hours. Thyroid Function Tests: No results for input(s): TSH, T4TOTAL, FREET4, T3FREE, THYROIDAB in the last 72 hours. Anemia Panel: No results for input(s): VITAMINB12, FOLATE, FERRITIN, TIBC, IRON, RETICCTPCT in the last 72 hours. Sepsis Labs: No results for input(s): PROCALCITON, LATICACIDVEN in the last 168 hours.  No results found for this or any previous visit (from the past 240 hour(s)).  Radiology Studies: No results found.   Scheduled Meds: . acetaminophen  1,000 mg Oral TID  . amphetamine-dextroamphetamine  30 mg Oral BID  . ARIPiprazole  20 mg Oral Daily  . buPROPion  150 mg Oral Daily  . Chlorhexidine Gluconate Cloth  6 each Topical Q0600  . dextromethorphan-guaiFENesin  1 tablet Oral BID  . diclofenac Sodium  4 g Topical QID  . feeding supplement (ENSURE ENLIVE)  237 mL Oral BID BM  . ferrous sulfate  325 mg Oral Q breakfast  . gabapentin  100 mg Oral TID  . influenza vac split quadrivalent PF  0.5 mL Intramuscular Tomorrow-1000  . loratadine  10 mg Oral Daily  . multivitamin with minerals  1 tablet Oral Daily  . nicotine  14 mg Transdermal Daily  . rifabutin  300 mg Oral Daily  . rivaroxaban  20 mg Oral Daily  . senna-docusate  1 tablet Oral BID  . sodium chloride flush  10-40 mL Intracatheter Q12H   Continuous Infusions: . sodium chloride  Stopped (11/18/19 1950)  . vancomycin 1,000 mg (12/04/19 1237)  . [START ON 12/05/2019] vancomycin       LOS: 66 days    Time spent: 25 minutes spent in the coordination of care today.    Jonnie Finner, DO [facility]  If 7PM-7AM, please contact night-coverage [email or web address] 12/04/2019, 1:29 PM

## 2019-12-04 NOTE — Plan of Care (Signed)
  Problem: Clinical Measurements: Goal: Will remain free from infection Outcome: Progressing Goal: Diagnostic test results will improve Outcome: Progressing Goal: Respiratory complications will improve Outcome: Progressing Goal: Cardiovascular complication will be avoided Outcome: Progressing   Problem: Nutrition: Goal: Adequate nutrition will be maintained Outcome: Progressing   Problem: Coping: Goal: Level of anxiety will decrease Outcome: Progressing   Problem: Elimination: Goal: Will not experience complications related to bowel motility Outcome: Progressing Goal: Will not experience complications related to urinary retention Outcome: Progressing   Problem: Pain Managment: Goal: General experience of comfort will improve Outcome: Progressing   Problem: Safety: Goal: Ability to remain free from injury will improve Outcome: Progressing   Problem: Skin Integrity: Goal: Risk for impaired skin integrity will decrease Outcome: Progressing   

## 2019-12-05 LAB — BASIC METABOLIC PANEL
Anion gap: 8 (ref 5–15)
BUN: 7 mg/dL (ref 6–20)
CO2: 26 mmol/L (ref 22–32)
Calcium: 8.5 mg/dL — ABNORMAL LOW (ref 8.9–10.3)
Chloride: 105 mmol/L (ref 98–111)
Creatinine, Ser: 0.52 mg/dL (ref 0.44–1.00)
GFR calc Af Amer: >60 mL/min (ref >60)
GFR calc non Af Amer: >60 mL/min (ref >60)
Glucose, Bld: 103 mg/dL — ABNORMAL HIGH (ref 70–99)
Potassium: 4 mmol/L (ref 3.5–5.1)
Sodium: 139 mmol/L (ref 135–145)

## 2019-12-05 NOTE — Progress Notes (Signed)
[name]  PROGRESS NOTE    Neila Teem  HBZ:[id number] DOB: [birth year] DOA: 09/29/2019 PCP: Patient, No Pcp Per   Brief Narrative:   Patient is a 39 year old female with history of IV drug abuse, anxiety, depression, anemia, status post gastric bypass who presented to the emergency department on 1/12 with complaints of 2-day history of sharp neck pain. She was recently hospitalized from 12/20-2/24 for facial cellulitis. MRI cervical spine showed discitis, osteomyelitis at C5-C6 and C6-7. Found to have prevertebral/retropharyngeal soft tissue phlegmonous inflammation with areas of nonenhancing material consistent with pus. Underwent C5-C6 anterior discectomy, instrumented fusion cultures positive for MRSA. Started on vancomycin, rifampin. Follow-up cervical MRI showed progression of findings consistent with discitis/osteomyelitis involving C4, C5, C6 and C7 vertebrae. Neurosurgery and ID recommended to continue aggressive medical therapy. Current plan is to continue IV antibiotics till March 26.  12/05/19: MRI shows slow improvement. She is calmer today. No changes to regimen. Continue current Tx.   Assessment & Plan:   Principal Problem:   Abscess in epidural space of cervical spine Active Problems:   Depression   Acquired fusion of cervical spine   IVDU (intravenous drug user)   Homeless   S/P cervical discectomy   Anxiety   Status post gastric bypass for obesity   Hepatitis C antibody positive in blood   Acute bilateral thoracic back pain   Body aches   Methamphetamine abuse (HCC)   Pain of right sternoclavicular joint  Cervical spine epidural abscess with vertebral osteomyelitis - Status post C4-C7 fusion - continue IV antibiotics till March 26. - Neurosurgery and ID were following. - Pain management w/ oral PRN diluadid; PRN valium/flexeril for spasm - 3/17: continue abx, continue narcotic wean - CM consulted for help with PCP/follow up - 3/18:  continue abx, continue narcotic wean; she will need PCP follow up but she states she is moving to [facility]. Not much that we can do to help on that front. IV abx end on 3/26. She will be inpt until then.     - 3/19: as above; check rpt MRI  History of IV drug abuse - Needs follow up at pain management clinic/drug rehabilitation  Hepatitis C - likely secondary to above - Needs to follow-up as an outpatient for hepatitis C management.   Bilateral upper extremity DVT - Ultrasound showed right upper extremity superficial and deep venous thrombosis of cephalic and brachial veins and right superficial vein thrombosis of basilic and cephalic vein.  - continue Xarelto  Depression/anxiety - continue Adderall, aripiprazole, trazodone, bupropion.  Chronic normocytic anemia - Iron studies showed iron deficiency; on oral iron replacement - 3/18: Hgb is stable; no evidence of new bleed, monitor  Tobacco abuse - nicotine patch - advised against any further drug and tobacco use  Congestion     - claritin, mucinex DM, ocean nasal spray  DVT prophylaxis: xarelto Code Status: FULL Family Communication: None at bedside   Disposition Plan: Continue IV abx through 3/26; then discharge to home on PO meds  Consultants:   Neurosurgery  ID  Procedures:   C4-C7 fusion  Antimicrobials:  . Vanc, rifabutin   ROS:  Reports that she is still having watery eyes . Remainder 10-pt ROS is negative for all not previously mentioned.  Subjective: "I'm afraid to use another medicine."  Objective: Vitals:   12/04/19 2112 12/04/19 2152 12/05/19 0028 12/05/19 0533  BP: 108/65  104/71 114/80  Pulse: (!) 120 (!) 114 93 90  Resp:   16   Temp:  98.3 F (36.8 C)   98.7 F (37.1 C)  TempSrc: Oral   Oral  SpO2: 99%  99% 100%  Weight:      Height:        Intake/Output Summary (Last 24 hours) at 12/05/2019 0735 Last data filed at 12/05/2019 0300 Gross per 24  hour  Intake 1000 ml  Output --  Net 1000 ml   Filed Weights   09/29/19 2032  Weight: 49.9 kg    Examination:  General: 39 y.o. female resting in bed in NAD Cardiovascular: RRR, +S1, S2, no m/g/r Respiratory: CTABL, no w/r/r, normal WOB GI: BS+, NDNT, soft MSK: No e/c/c Neuro: A&O x 3, no focal deficits Psyc: calm/cooperative   Data Reviewed: I have personally reviewed following labs and imaging studies.  CBC: Recent Labs  Lab 12/03/19 0400 12/04/19 0601  WBC 4.2 5.0  NEUTROABS 2.1 2.8  HGB 8.2* 9.0*  HCT 26.4* 29.5*  MCV 84.6 85.3  PLT 340 338   Basic Metabolic Panel: Recent Labs  Lab 11/30/19 0950 12/03/19 0400 12/04/19 0245 12/05/19 0345  NA 136 138 139 139  K 3.7 3.9 3.9 4.0  CL 103 102 105 105  CO2 23 26 26 26   GLUCOSE 182* 102* 101* 103*  BUN 8 12 8 7   CREATININE 0.65 0.52 0.53 0.52  CALCIUM 8.3* 8.6* 8.4* 8.5*  MG  --  1.8 1.8  --    GFR: Estimated Creatinine Clearance: 74.4 mL/min (by C-G formula based on SCr of 0.52 mg/dL). Liver Function Tests: Recent Labs  Lab 12/03/19 0400 12/04/19 0245  AST 32 37  ALT 22 24  ALKPHOS 76 77  BILITOT 0.5 0.5  PROT 6.4* 6.3*  ALBUMIN 2.5* 2.3*   No results for input(s): LIPASE, AMYLASE in the last 168 hours. No results for input(s): AMMONIA in the last 168 hours. Coagulation Profile: No results for input(s): INR, PROTIME in the last 168 hours. Cardiac Enzymes: No results for input(s): CKTOTAL, CKMB, CKMBINDEX, TROPONINI in the last 168 hours. BNP (last 3 results) No results for input(s): PROBNP in the last 8760 hours. HbA1C: No results for input(s): HGBA1C in the last 72 hours. CBG: No results for input(s): GLUCAP in the last 168 hours. Lipid Profile: No results for input(s): CHOL, HDL, LDLCALC, TRIG, CHOLHDL, LDLDIRECT in the last 72 hours. Thyroid Function Tests: No results for input(s): TSH, T4TOTAL, FREET4, T3FREE, THYROIDAB in the last 72 hours. Anemia Panel: No results for input(s):  VITAMINB12, FOLATE, FERRITIN, TIBC, IRON, RETICCTPCT in the last 72 hours. Sepsis Labs: No results for input(s): PROCALCITON, LATICACIDVEN in the last 168 hours.  No results found for this or any previous visit (from the past 240 hour(s)).    Radiology Studies: MR CERVICAL SPINE W WO CONTRAST  Result Date: 12/04/2019 CLINICAL DATA:  Osteomyelitis, follow-up EXAM: MRI CERVICAL SPINE WITHOUT AND WITH CONTRAST TECHNIQUE: Multiplanar and multiecho pulse sequences of the cervical spine, to include the craniocervical junction and cervicothoracic junction, were obtained without and with intravenous contrast. CONTRAST:  42mL GADAVIST GADOBUTROL 1 MMOL/ML IV SOLN COMPARISON:  11/16/2019 FINDINGS: Alignment: Nonspecific change with persistent focal kyphosis at the C4-C6 levels Vertebrae: Anterior fusion is again identified at C5-C6. The hardware is not well evaluated on this study and there is associated susceptibility artifact. Abnormal marrow signal persists at C4-C7 but enhancement post does appear decreased. There is persistent edema or enhancement associated with the bilateral C2-C3 facets, right C3-C4 facet joint, and left C7-T1 facet joint with some interval improvement. Ventral  epidural enhancement at these levels appears decreased. Persistent dorsal epidural STIR hyperintensity and enhancement. Cord: No abnormal cord signal. Posterior Fossa, vertebral arteries, paraspinal tissues: Persistent prevertebral edema with decreased enhancement. Disc levels: Degree of spinal canal narrowing is unchanged. No cord compression. IMPRESSION: Persistent findings of discitis/osteomyelitis and presumed facet joint septic arthritis. Ventral and dorsal epidural as well as prevertebral inflammatory change also remains. However, there has been some improvement since the 05/17/2020 study primarily evidenced by decreased enhancement. Electronically Signed   By: Guadlupe Spanish M.D.   On: 12/04/2019 17:11     Scheduled Meds: .  acetaminophen  1,000 mg Oral TID  . amphetamine-dextroamphetamine  30 mg Oral BID  . ARIPiprazole  20 mg Oral Daily  . buPROPion  150 mg Oral Daily  . Chlorhexidine Gluconate Cloth  6 each Topical Q0600  . dextromethorphan-guaiFENesin  1 tablet Oral BID  . diclofenac Sodium  4 g Topical QID  . feeding supplement (ENSURE ENLIVE)  237 mL Oral BID BM  . ferrous sulfate  325 mg Oral Q breakfast  . gabapentin  100 mg Oral TID  . influenza vac split quadrivalent PF  0.5 mL Intramuscular Tomorrow-1000  . loratadine  10 mg Oral Daily  . multivitamin with minerals  1 tablet Oral Daily  . nicotine  14 mg Transdermal Daily  . rifabutin  300 mg Oral Daily  . rivaroxaban  20 mg Oral Daily  . senna-docusate  1 tablet Oral BID  . sodium chloride flush  10-40 mL Intracatheter Q12H   Continuous Infusions: . sodium chloride Stopped (11/18/19 1950)  . vancomycin 1,000 mg (12/05/19 0035)     LOS: 67 days    Time spent: 25 minutes spent in the coordination of care today.    Teddy Spike, DO [facility]  If 7PM-7AM, please contact night-coverage [email or web address] 12/05/2019, 7:35 AM

## 2019-12-05 NOTE — Plan of Care (Signed)
  Problem: Pain Managment: Goal: General experience of comfort will improve Outcome: Progressing   Problem: Safety: Goal: Ability to remain free from injury will improve Outcome: Progressing   

## 2019-12-05 NOTE — Plan of Care (Signed)
  Problem: Nutrition: Goal: Adequate nutrition will be maintained Outcome: Progressing   Problem: Pain Managment: Goal: General experience of comfort will improve Outcome: Progressing   Problem: Safety: Goal: Ability to remain free from injury will improve Outcome: Progressing   

## 2019-12-06 LAB — CBC WITH DIFFERENTIAL/PLATELET
Abs Immature Granulocytes: 0.02 10*3/uL (ref 0.00–0.07)
Basophils Absolute: 0.1 10*3/uL (ref 0.0–0.1)
Basophils Relative: 1 %
Eosinophils Absolute: 0.2 10*3/uL (ref 0.0–0.5)
Eosinophils Relative: 3 %
HCT: 25.7 % — ABNORMAL LOW (ref 36.0–46.0)
Hemoglobin: 7.9 g/dL — ABNORMAL LOW (ref 12.0–15.0)
Immature Granulocytes: 0 %
Lymphocytes Relative: 23 %
Lymphs Abs: 1.4 10*3/uL (ref 0.7–4.0)
MCH: 25.6 pg — ABNORMAL LOW (ref 26.0–34.0)
MCHC: 30.7 g/dL (ref 30.0–36.0)
MCV: 83.4 fL (ref 80.0–100.0)
Monocytes Absolute: 0.7 10*3/uL (ref 0.1–1.0)
Monocytes Relative: 11 %
Neutro Abs: 3.9 10*3/uL (ref 1.7–7.7)
Neutrophils Relative %: 62 %
Platelets: 373 10*3/uL (ref 150–400)
RBC: 3.08 MIL/uL — ABNORMAL LOW (ref 3.87–5.11)
RDW: 13.4 % (ref 11.5–15.5)
WBC: 6.3 10*3/uL (ref 4.0–10.5)
nRBC: 0 % (ref 0.0–0.2)

## 2019-12-06 LAB — COMPREHENSIVE METABOLIC PANEL
ALT: 22 U/L (ref 0–44)
AST: 23 U/L (ref 15–41)
Albumin: 2.5 g/dL — ABNORMAL LOW (ref 3.5–5.0)
Alkaline Phosphatase: 79 U/L (ref 38–126)
Anion gap: 8 (ref 5–15)
BUN: 7 mg/dL (ref 6–20)
CO2: 27 mmol/L (ref 22–32)
Calcium: 9 mg/dL (ref 8.9–10.3)
Chloride: 105 mmol/L (ref 98–111)
Creatinine, Ser: 0.6 mg/dL (ref 0.44–1.00)
GFR calc Af Amer: >60 mL/min (ref >60)
GFR calc non Af Amer: >60 mL/min (ref >60)
Glucose, Bld: 90 mg/dL (ref 70–99)
Potassium: 3.5 mmol/L (ref 3.5–5.1)
Sodium: 140 mmol/L (ref 135–145)
Total Bilirubin: 0.6 mg/dL (ref 0.3–1.2)
Total Protein: 6.6 g/dL (ref 6.5–8.1)

## 2019-12-06 LAB — MAGNESIUM: Magnesium: 1.8 mg/dL (ref 1.7–2.4)

## 2019-12-06 MED ORDER — FLUTICASONE PROPIONATE 50 MCG/ACT NA SUSP
1.0000 | Freq: Every day | NASAL | Status: DC
  Administered 2019-12-06 – 2019-12-11 (×6): 1 via NASAL
  Filled 2019-12-06: qty 16

## 2019-12-06 NOTE — Plan of Care (Signed)
  Problem: Clinical Measurements: Goal: Will remain free from infection Outcome: Progressing   Problem: Pain Managment: Goal: General experience of comfort will improve Outcome: Progressing   Problem: Skin Integrity: Goal: Risk for impaired skin integrity will decrease Outcome: Progressing   

## 2019-12-06 NOTE — Progress Notes (Signed)
[name] Kitchen  PROGRESS NOTE    Colleen Ramirez  CLE:[id number] DOB: [birth year] DOA: 09/29/2019 PCP: Patient, No Pcp Per   Brief Narrative:   Patient is a 39 year old female with history of IV drug abuse, anxiety, depression, anemia, status post gastric bypass who presented to the emergency department on 1/12 with complaints of 2-day history of sharp neck pain. She was recently hospitalized from 12/20-2/24 for facial cellulitis. MRI cervical spine showed discitis, osteomyelitis at C5-C6 and C6-7. Found to have prevertebral/retropharyngeal soft tissue phlegmonous inflammation with areas of nonenhancing material consistent with pus. Underwent C5-C6 anterior discectomy, instrumented fusion cultures positive for MRSA. Started on vancomycin, rifampin. Follow-up cervical MRI showed progression of findings consistent with discitis/osteomyelitis involving C4, C5, C6 and C7 vertebrae. Neurosurgery and ID recommended to continue aggressive medical therapy. Current plan is to continue IV antibiotics till March 26.  12/06/19: No acute events ON. Still reports congestion. Add flonase. Will need to speak with ID about final PO abx regimen. Pt/Family working on post-hospital arrangements. Continue current Tx.     Assessment & Plan:   Principal Problem:   Abscess in epidural space of cervical spine Active Problems:   Depression   Acquired fusion of cervical spine   IVDU (intravenous drug user)   Homeless   S/P cervical discectomy   Anxiety   Status post gastric bypass for obesity   Hepatitis C antibody positive in blood   Acute bilateral thoracic back pain   Body aches   Methamphetamine abuse (HCC)   Pain of right sternoclavicular joint  Cervical spine epidural abscess with vertebral osteomyelitis - Status post C4-C7 fusion - continue IV antibiotics till March 26. - Neurosurgery and ID were following. - Pain management w/ oral PRN diluadid; PRN valium/flexeril for spasm - 3/17:  continue abx, continue narcotic wean - CM consulted for help with PCP/follow up - 3/18: continue abx, continue narcotic wean; she will need PCP follow up but she states she is moving to [facility]. Not much that we can do to help on that front. IV abx end on 3/26. She will be inpt until then. - 3/19: as above; check rpt MRI     - 3/21: Rpt MRI shows slow improvement. IV abx through 3/26. Need to clarify with ID about PO regimen. Progressing well.   History of IV drug abuse - Needs follow up at pain management clinic/drug rehabilitation  Hepatitis C - likely secondary to above - Needs to follow-up as an outpatient for hepatitis C management.   Bilateral upper extremity DVT - Ultrasound showed right upper extremity superficial and deep venous thrombosis of cephalic and brachial veins and right superficial vein thrombosis of basilic and cephalic vein.  - continue Xarelto  Depression/anxiety - continue Adderall, aripiprazole, trazodone, bupropion.  Chronic normocytic anemia - Iron studies showed iron deficiency; on oral iron replacement - 3/18: Hgb is stable; no evidence of new bleed, monitor  Tobacco abuse - nicotine patch - advised against any further drug and tobacco use  Congestion - claritin, mucinex DM, ocean nasal spray     - 3/21: add flonase  DVT prophylaxis: xarelto Code Status: FULL Family Communication: Spoke with mother by phone   Disposition Plan: Continue IV abx through 3/26; then discharge to home on PO meds  Consultants:   Neurosurgery  ID  Procedures:   C4-C7 fusion  Antimicrobials:  . Vanc, rifabutin   ROS:  Reports still having congestion . Remainder 10-pt ROS is negative for all not previously mentioned.  Subjective: "I think  I can go with my uncle's doctor."  Objective: Vitals:   12/05/19 2018 12/05/19 2241 12/06/19 0016 12/06/19 0358  BP: 126/88 127/88 (!) 117/95 112/79  Pulse: (!)  119 (!) 113 (!) 129 (!) 103  Resp:  16 16   Temp: 98.4 F (36.9 C) 98.4 F (36.9 C) 98.4 F (36.9 C) 98.4 F (36.9 C)  TempSrc: Oral Oral Oral Oral  SpO2: 100% 100% 98% 98%  Weight:      Height:        Intake/Output Summary (Last 24 hours) at 12/06/2019 1425 Last data filed at 12/06/2019 0900 Gross per 24 hour  Intake 480 ml  Output --  Net 480 ml   Filed Weights   09/29/19 2032  Weight: 49.9 kg    Examination:  General: 39 y.o. female resting in chair in NAD in better spirits this AM Cardiovascular: RRR, +S1, S2, no m/g/r, equal pulses throughout Respiratory: CTABL, no w/r/r, normal WOB GI: BS+, NDNT, no masses noted, no organomegaly noted MSK: No e/c/c Neuro: A&O x 3, no focal deficits Psyc: Appropriate interaction and affect, calm/cooperative   Data Reviewed: I have personally reviewed following labs and imaging studies.  CBC: Recent Labs  Lab 12/03/19 0400 12/04/19 0601 12/06/19 0343  WBC 4.2 5.0 6.3  NEUTROABS 2.1 2.8 3.9  HGB 8.2* 9.0* 7.9*  HCT 26.4* 29.5* 25.7*  MCV 84.6 85.3 83.4  PLT 340 374 948   Basic Metabolic Panel: Recent Labs  Lab 11/30/19 0950 12/03/19 0400 12/04/19 0245 12/05/19 0345 12/06/19 0343  NA 136 138 139 139 140  K 3.7 3.9 3.9 4.0 3.5  CL 103 102 105 105 105  CO2 23 26 26 26 27   GLUCOSE 182* 102* 101* 103* 90  BUN 8 12 8 7 7   CREATININE 0.65 0.52 0.53 0.52 0.60  CALCIUM 8.3* 8.6* 8.4* 8.5* 9.0  MG  --  1.8 1.8  --  1.8   GFR: Estimated Creatinine Clearance: 74.4 mL/min (by C-G formula based on SCr of 0.6 mg/dL). Liver Function Tests: Recent Labs  Lab 12/03/19 0400 12/04/19 0245 12/06/19 0343  AST 32 37 23  ALT 22 24 22   ALKPHOS 76 77 79  BILITOT 0.5 0.5 0.6  PROT 6.4* 6.3* 6.6  ALBUMIN 2.5* 2.3* 2.5*   No results for input(s): LIPASE, AMYLASE in the last 168 hours. No results for input(s): AMMONIA in the last 168 hours. Coagulation Profile: No results for input(s): INR, PROTIME in the last 168  hours. Cardiac Enzymes: No results for input(s): CKTOTAL, CKMB, CKMBINDEX, TROPONINI in the last 168 hours. BNP (last 3 results) No results for input(s): PROBNP in the last 8760 hours. HbA1C: No results for input(s): HGBA1C in the last 72 hours. CBG: No results for input(s): GLUCAP in the last 168 hours. Lipid Profile: No results for input(s): CHOL, HDL, LDLCALC, TRIG, CHOLHDL, LDLDIRECT in the last 72 hours. Thyroid Function Tests: No results for input(s): TSH, T4TOTAL, FREET4, T3FREE, THYROIDAB in the last 72 hours. Anemia Panel: No results for input(s): VITAMINB12, FOLATE, FERRITIN, TIBC, IRON, RETICCTPCT in the last 72 hours. Sepsis Labs: No results for input(s): PROCALCITON, LATICACIDVEN in the last 168 hours.  No results found for this or any previous visit (from the past 240 hour(s)).    Radiology Studies: MR CERVICAL SPINE W WO CONTRAST  Result Date: 12/04/2019 CLINICAL DATA:  Osteomyelitis, follow-up EXAM: MRI CERVICAL SPINE WITHOUT AND WITH CONTRAST TECHNIQUE: Multiplanar and multiecho pulse sequences of the cervical spine, to include the craniocervical junction  and cervicothoracic junction, were obtained without and with intravenous contrast. CONTRAST:  43mL GADAVIST GADOBUTROL 1 MMOL/ML IV SOLN COMPARISON:  11/16/2019 FINDINGS: Alignment: Nonspecific change with persistent focal kyphosis at the C4-C6 levels Vertebrae: Anterior fusion is again identified at C5-C6. The hardware is not well evaluated on this study and there is associated susceptibility artifact. Abnormal marrow signal persists at C4-C7 but enhancement post does appear decreased. There is persistent edema or enhancement associated with the bilateral C2-C3 facets, right C3-C4 facet joint, and left C7-T1 facet joint with some interval improvement. Ventral epidural enhancement at these levels appears decreased. Persistent dorsal epidural STIR hyperintensity and enhancement. Cord: No abnormal cord signal. Posterior Fossa,  vertebral arteries, paraspinal tissues: Persistent prevertebral edema with decreased enhancement. Disc levels: Degree of spinal canal narrowing is unchanged. No cord compression. IMPRESSION: Persistent findings of discitis/osteomyelitis and presumed facet joint septic arthritis. Ventral and dorsal epidural as well as prevertebral inflammatory change also remains. However, there has been some improvement since the 05/17/2020 study primarily evidenced by decreased enhancement. Electronically Signed   By: Guadlupe Spanish M.D.   On: 12/04/2019 17:11     Scheduled Meds: . acetaminophen  1,000 mg Oral TID  . amphetamine-dextroamphetamine  30 mg Oral BID  . ARIPiprazole  20 mg Oral Daily  . buPROPion  150 mg Oral Daily  . Chlorhexidine Gluconate Cloth  6 each Topical Q0600  . dextromethorphan-guaiFENesin  1 tablet Oral BID  . diclofenac Sodium  4 g Topical QID  . feeding supplement (ENSURE ENLIVE)  237 mL Oral BID BM  . ferrous sulfate  325 mg Oral Q breakfast  . fluticasone  1 spray Each Nare Daily  . gabapentin  100 mg Oral TID  . influenza vac split quadrivalent PF  0.5 mL Intramuscular Tomorrow-1000  . loratadine  10 mg Oral Daily  . multivitamin with minerals  1 tablet Oral Daily  . nicotine  14 mg Transdermal Daily  . rifabutin  300 mg Oral Daily  . rivaroxaban  20 mg Oral Daily  . senna-docusate  1 tablet Oral BID  . sodium chloride flush  10-40 mL Intracatheter Q12H   Continuous Infusions: . sodium chloride Stopped (11/18/19 1950)  . vancomycin 1,000 mg (12/06/19 1222)     LOS: 68 days    Time spent: 25 minutes spent in the coordination of care today.    Teddy Spike, DO [facility]  If 7PM-7AM, please contact night-coverage [email or web address] 12/06/2019, 2:25 PM

## 2019-12-07 LAB — COMPREHENSIVE METABOLIC PANEL
ALT: 20 U/L (ref 0–44)
AST: 21 U/L (ref 15–41)
Albumin: 2.6 g/dL — ABNORMAL LOW (ref 3.5–5.0)
Alkaline Phosphatase: 82 U/L (ref 38–126)
Anion gap: 8 (ref 5–15)
BUN: 10 mg/dL (ref 6–20)
CO2: 26 mmol/L (ref 22–32)
Calcium: 9 mg/dL (ref 8.9–10.3)
Chloride: 107 mmol/L (ref 98–111)
Creatinine, Ser: 0.59 mg/dL (ref 0.44–1.00)
GFR calc Af Amer: >60 mL/min (ref >60)
GFR calc non Af Amer: >60 mL/min (ref >60)
Glucose, Bld: 113 mg/dL — ABNORMAL HIGH (ref 70–99)
Potassium: 3.9 mmol/L (ref 3.5–5.1)
Sodium: 141 mmol/L (ref 135–145)
Total Bilirubin: 0.4 mg/dL (ref 0.3–1.2)
Total Protein: 6.7 g/dL (ref 6.5–8.1)

## 2019-12-07 LAB — CBC WITH DIFFERENTIAL/PLATELET
Abs Immature Granulocytes: 0.03 10*3/uL (ref 0.00–0.07)
Basophils Absolute: 0.1 10*3/uL (ref 0.0–0.1)
Basophils Relative: 1 %
Eosinophils Absolute: 0.3 10*3/uL (ref 0.0–0.5)
Eosinophils Relative: 5 %
HCT: 26.4 % — ABNORMAL LOW (ref 36.0–46.0)
Hemoglobin: 8.2 g/dL — ABNORMAL LOW (ref 12.0–15.0)
Immature Granulocytes: 1 %
Lymphocytes Relative: 27 %
Lymphs Abs: 1.4 10*3/uL (ref 0.7–4.0)
MCH: 26 pg (ref 26.0–34.0)
MCHC: 31.1 g/dL (ref 30.0–36.0)
MCV: 83.8 fL (ref 80.0–100.0)
Monocytes Absolute: 0.7 10*3/uL (ref 0.1–1.0)
Monocytes Relative: 13 %
Neutro Abs: 2.9 10*3/uL (ref 1.7–7.7)
Neutrophils Relative %: 53 %
Platelets: 417 10*3/uL — ABNORMAL HIGH (ref 150–400)
RBC: 3.15 MIL/uL — ABNORMAL LOW (ref 3.87–5.11)
RDW: 13.5 % (ref 11.5–15.5)
WBC: 5.4 10*3/uL (ref 4.0–10.5)
nRBC: 0 % (ref 0.0–0.2)

## 2019-12-07 LAB — MAGNESIUM: Magnesium: 1.8 mg/dL (ref 1.7–2.4)

## 2019-12-07 NOTE — Plan of Care (Signed)
  Problem: Clinical Measurements: Goal: Will remain free from infection Outcome: Progressing Goal: Diagnostic test results will improve Outcome: Progressing   Problem: Pain Managment: Goal: General experience of comfort will improve Outcome: Progressing   

## 2019-12-07 NOTE — Progress Notes (Signed)
Occupational Therapy Treatment Patient Details Name: Colleen Ramirez MRN: [id number] DOB: 20-Aug-1981 Today's Date: 12/07/2019    History of present illness Colleen Ramirez is a 39 y.o. F admitted to the ED with c/o severe neck pain secondary to a cervical epidural abscess. She is s/p C5-6 anterior cervical discectomy and instrumented fusion. PMH: IVDU, anemia, anxiety, depression, recent admission for facial cellulitis.    OT comments  Pt continues to complain of cervical and upper trap pain. BUE weakness, RUE weaker than L, especially in shoulder girdle. Feel pt would benefit from outpt OT once in [place] to address shoulder girdle strengthening; BUE strengthening, improving posture and cervical ROM to facilitate return to PLOF with IADL tasks and mobility. Pt in agreement.   Follow Up Recommendations  Outpatient OT;Supervision - Intermittent    Equipment Recommendations  None recommended by OT    Recommendations for Other Services      Precautions / Restrictions Precautions Precautions: Cervical Precaution Booklet Issued: Yes (comment) Required Braces or Orthoses: Cervical Brace Cervical Brace: Soft collar;For comfort       Mobility Bed Mobility               General bed mobility comments: Educated on continueing to use sedelying to sit; sit - sidelying to reduce pain  Transfers Overall transfer level: Independent                    Balance                                           ADL either performed or assessed with clinical judgement   ADL                                         General ADL Comments: Pt overall modified independent with ADL however pt reports pain with ADL. REviewed compensatory strategies to use for ADL. Pt will need a shower seat. Pt states her family can get on eif there is not a built in shower seat. Educated pt on using a reacher to obtain items form lower areas - shw was issued a Sports administrator on  previous session. Educated pt ot not place items in low drawers to reduce pain with obtaining itemsPt using soft collar for comfort which is appropriate at this time.      Vision       Perception     Praxis      Cognition Arousal/Alertness: Awake/alert Behavior During Therapy: WFL for tasks assessed/performed Overall Cognitive Status: Within Functional Limits for tasks assessed                                          Exercises Exercises: Other exercises Other Exercises Other Exercises: scapular retract and hold Other Exercises: wall climbs with elbow at 90 at door frame to increase scapualr retraxton adn downward rotation with shoulder abducted and externally roataed to provide anterior stretch   Shoulder Instructions       General Comments      Pertinent Vitals/ Pain       Pain Assessment: Faces Faces Pain Scale: Hurts little more Pain Location: bil shoulders, cervical region Pain Descriptors / Indicators: Discomfort  Pain Intervention(s): Limited activity within patient's tolerance;Repositioned  Home Living                                          Prior Functioning/Environment              Frequency  Min 1X/week        Progress Toward Goals  OT Goals(current goals can now be found in the care plan section)  Progress towards OT goals: (All further needs to be addressed by outpt)  Acute Rehab OT Goals Patient Stated Goal: get strength back in BUE OT Goal Formulation: All assessment and education complete, DC therapy(further OT to be addressed in outpt setting) ADL Goals Pt Will Perform Grooming: with modified independence;Independently Pt Will Perform Upper Body Dressing: with modified independence;Independently Pt Will Perform Lower Body Dressing: with modified independence;Independently Pt Will Transfer to Toilet: with modified independence;Independently Pt Will Perform Toileting - Clothing Manipulation and hygiene:  with modified independence;Independently Additional ADL Goal #1: Pt will perform BUE HEP with level 3 theraband independently (bicep curl, horizontal add/abduction (bilateral and unilateral)) Additional ADL Goal #2: Pt will be independent in theraputty yellow sequencing activities Additional ADL Goal #3: Pt will be independent in Bil shoulder and wrist strengthening exercies/activities  Plan Discharge plan needs to be updated    Co-evaluation                 AM-PAC OT "6 Clicks" Daily Activity     Outcome Measure   Help from another person eating meals?: None Help from another person taking care of personal grooming?: None Help from another person toileting, which includes using toliet, bedpan, or urinal?: None Help from another person bathing (including washing, rinsing, drying)?: None Help from another person to put on and taking off regular upper body clothing?: None Help from another person to put on and taking off regular lower body clothing?: None 6 Click Score: 24    End of Session Equipment Utilized During Treatment: Cervical collar  OT Visit Diagnosis: Muscle weakness (generalized) (M62.81);Pain Pain - Right/Left: Right Pain - part of body: Shoulder;Arm(cervical area)   Activity Tolerance Patient tolerated treatment well   Patient Left Other (comment)(walking in room)   Nurse Communication Mobility status;Other (comment)(DC needs)        Time: 4174-0814 OT Time Calculation (min): 36 min  Charges: OT General Charges $OT Visit: 1 Visit OT Treatments $Self Care/Home Management : 8-22 mins $Therapeutic Exercise: 8-22 mins  Maurie Boettcher, OT/L   Acute OT Clinical Specialist Acute Rehabilitation Services Pager [phone] Office [phone]    [facility] 12/07/2019, 10:28 AM

## 2019-12-07 NOTE — Progress Notes (Signed)
[name] Kitchen  PROGRESS NOTE    Colleen Ramirez  TWS:[id number] DOB: 02-22-1981 DOA: 09/29/2019 PCP: Patient, No Pcp Per   Brief Narrative:   Patient is a 39 year old female with history of IV drug abuse, anxiety, depression, anemia, status post gastric bypass who presented to the emergency department on 1/12 with complaints of 2-day history of sharp neck pain. She was recently hospitalized from 12/20-2/24 for facial cellulitis. MRI cervical spine showed discitis, osteomyelitis at C5-C6 and C6-7. Found to have prevertebral/retropharyngeal soft tissue phlegmonous inflammation with areas of nonenhancing material consistent with pus. Underwent C5-C6 anterior discectomy, instrumented fusion cultures positive for MRSA. Started on vancomycin, rifampin. Follow-up cervical MRI showed progression of findings consistent with discitis/osteomyelitis involving C4, C5, C6 and C7 vertebrae. Neurosurgery and ID recommended to continue aggressive medical therapy. Current plan is to continue IV antibiotics till March 26.  12/07/19: Will have OT review prior to discharge. Will have ID review prior to discharge. IV abx through Friday. Will switch to PO then, but need to know regimen and duration.    Assessment & Plan:   Principal Problem:   Abscess in epidural space of cervical spine Active Problems:   Depression   Acquired fusion of cervical spine   IVDU (intravenous drug user)   Homeless   S/P cervical discectomy   Anxiety   Status post gastric bypass for obesity   Hepatitis C antibody positive in blood   Acute bilateral thoracic back pain   Body aches   Methamphetamine abuse (HCC)   Pain of right sternoclavicular joint  Cervical spine epidural abscess with vertebral osteomyelitis - Status post C4-C7 fusion - continue IV antibiotics till March 26. - Neurosurgery and ID were following. - Pain management w/ oral PRN diluadid; PRN valium/flexeril for spasm - 3/17: continue abx, continue  narcotic wean - CM consulted for help with PCP/follow up - 3/18: continue abx, continue narcotic wean; she will need PCP follow up but she states she is moving to [facility]. Not much that we can do to help on that front. IV abx end on 3/26. She will be inpt until then. - 3/19: as above; check rpt MRI     - 3/21: Rpt MRI shows slow improvement. IV abx through 3/26. Need to clarify with ID about PO regimen. Progressing well  History of IV drug abuse - Needs follow up at pain management clinic/drug rehabilitation  Hepatitis C - likely secondary to above - Needs to follow-up as an outpatient for hepatitis C management.   Bilateral upper extremity DVT - Ultrasound showed right upper extremity superficial and deep venous thrombosis of cephalic and brachial veins and right superficial vein thrombosis of basilic and cephalic vein.  - continue Xarelto  Depression/anxiety - continue Adderall, aripiprazole, trazodone, bupropion.  Chronic normocytic anemia - Iron studies showed iron deficiency; on oral iron replacement - 3/18: Hgb is stable; no evidence of new bleed, monitor     - 3/22: remains stable, monitor  Tobacco abuse - nicotine patch - advised against any further drug and tobacco use  Congestion - claritin, mucinex DM, ocean nasal spray     - 3/21: add flonase     - 3/22: improved congestion  DVT prophylaxis: xarelto Code Status: FULL   Disposition Plan: Continue IV abx through 3/26; then discharge to home on PO meds.   Consultants:   Neurosurgery  ID  Procedures:   C4-C7 fusion  Antimicrobials:  . Vanc, rifabutin   Subjective: "Is there a different collar I can use."  Objective: Vitals:   12/06/19 1605 12/06/19 2008 12/07/19 0342 12/07/19 0832  BP: (!) 123/98 112/88 95/69 103/77  Pulse: 100 (!) 102 83 (!) 102  Resp:    17  Temp: (!) 97.3 F (36.3 C) 98.2 F (36.8 C) (!) 97.4 F (36.3 C) 98.3 F (36.8  C)  TempSrc: Oral Oral Oral Oral  SpO2: 100% 99% 100% 100%  Weight:      Height:        Intake/Output Summary (Last 24 hours) at 12/07/2019 8413 Last data filed at 12/06/2019 1900 Gross per 24 hour  Intake 720 ml  Output --  Net 720 ml   Filed Weights   09/29/19 2032  Weight: 49.9 kg    Examination:  General: 39 y.o. female resting chair in NAD Cardiovascular: RRR, +S1, S2, no m/g/r, equal pulses throughout Respiratory: CTABL, no w/r/r, normal WOB GI: BS+, NDNT, soft MSK: No e/c/c Neuro: A&O x 3, no focal deficits Psyc: Appropriate interaction and affect, calm/cooperative  Data Reviewed: I have personally reviewed following labs and imaging studies.  CBC: Recent Labs  Lab 12/03/19 0400 12/04/19 0601 12/06/19 0343 12/07/19 0505  WBC 4.2 5.0 6.3 5.4  NEUTROABS 2.1 2.8 3.9 2.9  HGB 8.2* 9.0* 7.9* 8.2*  HCT 26.4* 29.5* 25.7* 26.4*  MCV 84.6 85.3 83.4 83.8  PLT 340 374 373 417*   Basic Metabolic Panel: Recent Labs  Lab 12/03/19 0400 12/04/19 0245 12/05/19 0345 12/06/19 0343 12/07/19 0505  NA 138 139 139 140 141  K 3.9 3.9 4.0 3.5 3.9  CL 102 105 105 105 107  CO2 26 26 26 27 26   GLUCOSE 102* 101* 103* 90 113*  BUN 12 8 7 7 10   CREATININE 0.52 0.53 0.52 0.60 0.59  CALCIUM 8.6* 8.4* 8.5* 9.0 9.0  MG 1.8 1.8  --  1.8 1.8   GFR: Estimated Creatinine Clearance: 74.4 mL/min (by C-G formula based on SCr of 0.59 mg/dL). Liver Function Tests: Recent Labs  Lab 12/03/19 0400 12/04/19 0245 12/06/19 0343 12/07/19 0505  AST 32 37 23 21  ALT 22 24 22 20   ALKPHOS 76 77 79 82  BILITOT 0.5 0.5 0.6 0.4  PROT 6.4* 6.3* 6.6 6.7  ALBUMIN 2.5* 2.3* 2.5* 2.6*   No results for input(s): LIPASE, AMYLASE in the last 168 hours. No results for input(s): AMMONIA in the last 168 hours. Coagulation Profile: No results for input(s): INR, PROTIME in the last 168 hours. Cardiac Enzymes: No results for input(s): CKTOTAL, CKMB, CKMBINDEX, TROPONINI in the last 168 hours. BNP  (last 3 results) No results for input(s): PROBNP in the last 8760 hours. HbA1C: No results for input(s): HGBA1C in the last 72 hours. CBG: No results for input(s): GLUCAP in the last 168 hours. Lipid Profile: No results for input(s): CHOL, HDL, LDLCALC, TRIG, CHOLHDL, LDLDIRECT in the last 72 hours. Thyroid Function Tests: No results for input(s): TSH, T4TOTAL, FREET4, T3FREE, THYROIDAB in the last 72 hours. Anemia Panel: No results for input(s): VITAMINB12, FOLATE, FERRITIN, TIBC, IRON, RETICCTPCT in the last 72 hours. Sepsis Labs: No results for input(s): PROCALCITON, LATICACIDVEN in the last 168 hours.  No results found for this or any previous visit (from the past 240 hour(s)).    Radiology Studies: No results found.   Scheduled Meds: . acetaminophen  1,000 mg Oral TID  . amphetamine-dextroamphetamine  30 mg Oral BID  . ARIPiprazole  20 mg Oral Daily  . buPROPion  150 mg Oral Daily  . Chlorhexidine Gluconate Cloth  6 each Topical Q0600  . dextromethorphan-guaiFENesin  1 tablet Oral BID  . diclofenac Sodium  4 g Topical QID  . feeding supplement (ENSURE ENLIVE)  237 mL Oral BID BM  . ferrous sulfate  325 mg Oral Q breakfast  . fluticasone  1 spray Each Nare Daily  . gabapentin  100 mg Oral TID  . influenza vac split quadrivalent PF  0.5 mL Intramuscular Tomorrow-1000  . loratadine  10 mg Oral Daily  . multivitamin with minerals  1 tablet Oral Daily  . nicotine  14 mg Transdermal Daily  . rifabutin  300 mg Oral Daily  . rivaroxaban  20 mg Oral Daily  . senna-docusate  1 tablet Oral BID  . sodium chloride flush  10-40 mL Intracatheter Q12H   Continuous Infusions: . sodium chloride Stopped (11/18/19 1950)  . vancomycin 1,000 mg (12/07/19 0109)     LOS: 69 days    Time spent: 25 minutes spent in the coordination of care today.    Jonnie Finner, DO [facility]  If 7PM-7AM, please contact night-coverage [email or web address] 12/07/2019, 8:52 AM

## 2019-12-07 NOTE — Plan of Care (Signed)
  Problem: Clinical Measurements: Goal: Will remain free from infection Outcome: Progressing   Problem: Pain Managment: Goal: General experience of comfort will improve Outcome: Progressing   Problem: Safety: Goal: Ability to remain free from injury will improve Outcome: Progressing   Problem: Skin Integrity: Goal: Risk for impaired skin integrity will decrease Outcome: Progressing   

## 2019-12-07 NOTE — Progress Notes (Signed)
Orthopedic Tech Progress Note Patient Details:  Colleen Ramirez Aug 30, 1981 [id number] While I was upstairs on 5N the RN asked could I bring patient a NEW SOFT COLLAR.  Ortho Devices Type of Ortho Device: Soft collar Ortho Device/Splint Location: NECK Ortho Device/Splint Interventions: Other (comment)   Post Interventions Patient Tolerated: Well Instructions Provided: Care of device, Adjustment of device   Donald Pore 12/07/2019, 10:01 AM

## 2019-12-08 LAB — CBC WITH DIFFERENTIAL/PLATELET
Abs Immature Granulocytes: 0.01 10*3/uL (ref 0.00–0.07)
Basophils Absolute: 0.1 10*3/uL (ref 0.0–0.1)
Basophils Relative: 1 %
Eosinophils Absolute: 0.3 10*3/uL (ref 0.0–0.5)
Eosinophils Relative: 4 %
HCT: 28.7 % — ABNORMAL LOW (ref 36.0–46.0)
Hemoglobin: 8.9 g/dL — ABNORMAL LOW (ref 12.0–15.0)
Immature Granulocytes: 0 %
Lymphocytes Relative: 24 %
Lymphs Abs: 1.6 10*3/uL (ref 0.7–4.0)
MCH: 25.8 pg — ABNORMAL LOW (ref 26.0–34.0)
MCHC: 31 g/dL (ref 30.0–36.0)
MCV: 83.2 fL (ref 80.0–100.0)
Monocytes Absolute: 0.6 10*3/uL (ref 0.1–1.0)
Monocytes Relative: 10 %
Neutro Abs: 4 10*3/uL (ref 1.7–7.7)
Neutrophils Relative %: 61 %
Platelets: 407 10*3/uL — ABNORMAL HIGH (ref 150–400)
RBC: 3.45 MIL/uL — ABNORMAL LOW (ref 3.87–5.11)
RDW: 13.6 % (ref 11.5–15.5)
WBC: 6.6 10*3/uL (ref 4.0–10.5)
nRBC: 0 % (ref 0.0–0.2)

## 2019-12-08 LAB — COMPREHENSIVE METABOLIC PANEL
ALT: 22 U/L (ref 0–44)
AST: 22 U/L (ref 15–41)
Albumin: 2.9 g/dL — ABNORMAL LOW (ref 3.5–5.0)
Alkaline Phosphatase: 85 U/L (ref 38–126)
Anion gap: 8 (ref 5–15)
BUN: 10 mg/dL (ref 6–20)
CO2: 26 mmol/L (ref 22–32)
Calcium: 9 mg/dL (ref 8.9–10.3)
Chloride: 105 mmol/L (ref 98–111)
Creatinine, Ser: 0.67 mg/dL (ref 0.44–1.00)
GFR calc Af Amer: >60 mL/min (ref >60)
GFR calc non Af Amer: >60 mL/min (ref >60)
Glucose, Bld: 147 mg/dL — ABNORMAL HIGH (ref 70–99)
Potassium: 3.9 mmol/L (ref 3.5–5.1)
Sodium: 139 mmol/L (ref 135–145)
Total Bilirubin: 0.5 mg/dL (ref 0.3–1.2)
Total Protein: 7.2 g/dL (ref 6.5–8.1)

## 2019-12-08 LAB — VANCOMYCIN, TROUGH: Vancomycin Tr: 9 ug/mL — ABNORMAL LOW (ref 15–20)

## 2019-12-08 LAB — MAGNESIUM: Magnesium: 1.8 mg/dL (ref 1.7–2.4)

## 2019-12-08 NOTE — Progress Notes (Addendum)
Pharmacy Antibiotic Note  Colleen Ramirez is a 39 y.o. female admitted on 09/29/2019 with MRSA cervical epidural abscess/osteo.  Pharmacy has been consulted for Vancomycin dosing.  To complete treatment 3/26 Last vancomycin trough = 9 (no change, drawn late, suspect in therapeutic range)  Plan: -Continue vancomycin 1000 mg IV q12h    Height: 5\' 3"  (160 cm) Weight: 110 lb (49.9 kg) IBW/kg (Calculated) : 52.4  Temp (24hrs), Avg:98.2 F (36.8 C), Min:98.2 F (36.8 C), Max:98.2 F (36.8 C)  Recent Labs  Lab 12/03/19 0400 12/03/19 0400 12/03/19 1149 12/03/19 2346 12/04/19 0245 12/04/19 0601 12/04/19 0941 12/05/19 0345 12/06/19 0343 12/07/19 0505 12/08/19 0342 12/08/19 1241  WBC 4.2  --   --   --   --  5.0  --   --  6.3 5.4 6.6  --   CREATININE 0.52   < >  --   --  0.53  --   --  0.52 0.60 0.59 0.67  --   VANCOTROUGH  --   --   --   --   --   --  <4*  --   --   --   --  9*  VANCOPEAK  --   --  36 16*  --   --   --   --   --   --   --   --    < > = values in this interval not displayed.    Estimated Creatinine Clearance: 74.4 mL/min (by C-G formula based on SCr of 0.67 mg/dL).    No Known Allergies  Thank you for involving pharmacy in this patient's care. 12/10/19, PharmD  12/08/2019 2:53 PM  **Pharmacist phone directory can be found on [email or web address] listed under [facility]**

## 2019-12-08 NOTE — Progress Notes (Signed)
[name] Kitchen  PROGRESS NOTE    Colleen Ramirez  TGG:[id number] DOB: 07/11/1981 DOA: 09/29/2019 PCP: Patient, No Pcp Per   Brief Narrative:   Patient is a 39 year old female with history of IV drug abuse, anxiety, depression, anemia, status post gastric bypass who presented to the emergency department on 1/12 with complaints of 2-day history of sharp neck pain. She was recently hospitalized from 12/20-2/24 for facial cellulitis. MRI cervical spine showed discitis, osteomyelitis at C5-C6 and C6-7. Found to have prevertebral/retropharyngeal soft tissue phlegmonous inflammation with areas of nonenhancing material consistent with pus. Underwent C5-C6 anterior discectomy, instrumented fusion cultures positive for MRSA. Started on vancomycin, rifampin. Follow-up cervical MRI showed progression of findings consistent with discitis/osteomyelitis involving C4, C5, C6 and C7 vertebrae. Neurosurgery and ID recommended to continue aggressive medical therapy. Current plan is to continue IV antibiotics till March 26.  12/08/19: OT has reviewed. Rec outpt OT. Have left her with posture exercises. Spoke with ID to clarify discharge regimen. Recommendation is doxy 100mg  BID and rifabutin 300mg  both for a minimum of 3 months. I made Dr aware that the patient will be moving to [facility] immediately at discharge. He recommends quick establishment of care in her new location and follow up within the next few weeks. She is stable today. Vanc trough low. Pharmacy continuing to dose.   Assessment & Plan:   Principal Problem:   Abscess in epidural space of cervical spine Active Problems:   Depression   Acquired fusion of cervical spine   IVDU (intravenous drug user)   Homeless   S/P cervical discectomy   Anxiety   Status post gastric bypass for obesity   Hepatitis C antibody positive in blood   Acute bilateral thoracic back pain   Body aches   Methamphetamine abuse (HCC)   Pain of right sternoclavicular  joint  Cervical spine epidural abscess with vertebral osteomyelitis - Status post C4-C7 fusion - continue IV antibiotics till March 26. - Neurosurgery and ID were following. - Pain management w/ oral PRN diluadid; PRN valium/flexeril for spasm - 3/17: continue abx, continue narcotic wean - CM consulted for help with PCP/follow up - 3/18: continue abx, continue narcotic wean; she will need PCP follow up but she states she is moving to [facility]. Not much that we can do to help on that front. IV abx end on 3/26. She will be inpt until then. - 3/19: as above; check rpt MRI - 3/21: Rpt MRI shows slow improvement. IV abx through 3/26. Need to clarify with ID about PO regimen. Progressing well     - 3/23: poke with ID to clarify discharge regimen. Recommendation is doxy 100mg  BID and rifabutin 300mg  both for a minimum of 3 months. Vanc trough low today; pharm continuing to dose.  History of IV drug abuse - Needs follow up at pain management clinic/drug rehabilitation  Hepatitis C - likely secondary to above - Needs to follow-up as an outpatient for hepatitis C management.   Bilateral upper extremity DVT - Ultrasound showed right upper extremity superficial and deep venous thrombosis of cephalic and brachial veins and right superficial vein thrombosis of basilic and cephalic vein.  - continue Xarelto  Depression/anxiety - continue Adderall, aripiprazole, trazodone, bupropion.  Chronic normocytic anemia - Iron studies showed iron deficiency; on oral iron replacement - 3/18: Hgb is stable; no evidence of new bleed, monitor     - 3/22: remains stable, monitor  Tobacco abuse - nicotine patch - advised against any further drug and tobacco use  Congestion - claritin, mucinex DM, ocean nasal spray - 3/21: add flonase     - 3/22: improved congestion     - 3/23: resolved  Normocytic anemia     - no bleed  noted; Hgb stable, monitor  DVT prophylaxis: Xarelto Code Status: FULL   Disposition Plan: Continue IV abx through 3/26; then discharge to home on PO abx.   Consultants:   ID  Neurosurgery  Procedures:   C4-C7 fusion  Antimicrobials:  Deniece Ree, rifabutin   Subjective: "Sounds like a volunteer study."  Objective: Vitals:   12/07/19 1920 12/07/19 2300 12/08/19 0403 12/08/19 0726  BP: 102/78  (!) 105/57 105/76  Pulse: 98  97 92  Resp: 19  17 17   Temp: 98.2 F (36.8 C)  98.2 F (36.8 C) 98.2 F (36.8 C)  TempSrc: Oral  Oral Oral  SpO2: 99% 98% 99% 99%  Weight:      Height:        Intake/Output Summary (Last 24 hours) at 12/08/2019 1459 Last data filed at 12/08/2019 0900 Gross per 24 hour  Intake 360 ml  Output --  Net 360 ml   Filed Weights   09/29/19 2032  Weight: 49.9 kg    Examination:  General: 39 y.o. female walking around room in NAD Cardiovascular: RRR, +S1, S2, no m/g/r Respiratory: CTABL, no w/r/r, normal WOB GI: BS+, NDNT, no masses noted, no organomegaly noted MSK: No e/c/c; soft c-collar in place Neuro: Alert to name, follows commands Psyc: calm/cooperative   Data Reviewed: I have personally reviewed following labs and imaging studies.  CBC: Recent Labs  Lab 12/03/19 0400 12/04/19 0601 12/06/19 0343 12/07/19 0505 12/08/19 0342  WBC 4.2 5.0 6.3 5.4 6.6  NEUTROABS 2.1 2.8 3.9 2.9 4.0  HGB 8.2* 9.0* 7.9* 8.2* 8.9*  HCT 26.4* 29.5* 25.7* 26.4* 28.7*  MCV 84.6 85.3 83.4 83.8 83.2  PLT 340 374 373 417* 829*   Basic Metabolic Panel: Recent Labs  Lab 12/03/19 0400 12/03/19 0400 12/04/19 0245 12/05/19 0345 12/06/19 0343 12/07/19 0505 12/08/19 0342  NA 138   < > 139 139 140 141 139  K 3.9   < > 3.9 4.0 3.5 3.9 3.9  CL 102   < > 105 105 105 107 105  CO2 26   < > 26 26 27 26 26   GLUCOSE 102*   < > 101* 103* 90 113* 147*  BUN 12   < > 8 7 7 10 10   CREATININE 0.52   < > 0.53 0.52 0.60 0.59 0.67  CALCIUM 8.6*   < > 8.4* 8.5* 9.0 9.0  9.0  MG 1.8  --  1.8  --  1.8 1.8 1.8   < > = values in this interval not displayed.   GFR: Estimated Creatinine Clearance: 74.4 mL/min (by C-G formula based on SCr of 0.67 mg/dL). Liver Function Tests: Recent Labs  Lab 12/03/19 0400 12/04/19 0245 12/06/19 0343 12/07/19 0505 12/08/19 0342  AST 32 37 23 21 22   ALT 22 24 22 20 22   ALKPHOS 76 77 79 82 85  BILITOT 0.5 0.5 0.6 0.4 0.5  PROT 6.4* 6.3* 6.6 6.7 7.2  ALBUMIN 2.5* 2.3* 2.5* 2.6* 2.9*   No results for input(s): LIPASE, AMYLASE in the last 168 hours. No results for input(s): AMMONIA in the last 168 hours. Coagulation Profile: No results for input(s): INR, PROTIME in the last 168 hours. Cardiac Enzymes: No results for input(s): CKTOTAL, CKMB, CKMBINDEX, TROPONINI in the last 168 hours.  BNP (last 3 results) No results for input(s): PROBNP in the last 8760 hours. HbA1C: No results for input(s): HGBA1C in the last 72 hours. CBG: No results for input(s): GLUCAP in the last 168 hours. Lipid Profile: No results for input(s): CHOL, HDL, LDLCALC, TRIG, CHOLHDL, LDLDIRECT in the last 72 hours. Thyroid Function Tests: No results for input(s): TSH, T4TOTAL, FREET4, T3FREE, THYROIDAB in the last 72 hours. Anemia Panel: No results for input(s): VITAMINB12, FOLATE, FERRITIN, TIBC, IRON, RETICCTPCT in the last 72 hours. Sepsis Labs: No results for input(s): PROCALCITON, LATICACIDVEN in the last 168 hours.  No results found for this or any previous visit (from the past 240 hour(s)).    Radiology Studies: No results found.   Scheduled Meds: . acetaminophen  1,000 mg Oral TID  . amphetamine-dextroamphetamine  30 mg Oral BID  . ARIPiprazole  20 mg Oral Daily  . buPROPion  150 mg Oral Daily  . Chlorhexidine Gluconate Cloth  6 each Topical Q0600  . dextromethorphan-guaiFENesin  1 tablet Oral BID  . diclofenac Sodium  4 g Topical QID  . feeding supplement (ENSURE ENLIVE)  237 mL Oral BID BM  . ferrous sulfate  325 mg Oral Q  breakfast  . fluticasone  1 spray Each Nare Daily  . gabapentin  100 mg Oral TID  . influenza vac split quadrivalent PF  0.5 mL Intramuscular Tomorrow-1000  . loratadine  10 mg Oral Daily  . multivitamin with minerals  1 tablet Oral Daily  . nicotine  14 mg Transdermal Daily  . rifabutin  300 mg Oral Daily  . rivaroxaban  20 mg Oral Daily  . senna-docusate  1 tablet Oral BID  . sodium chloride flush  10-40 mL Intracatheter Q12H   Continuous Infusions: . sodium chloride Stopped (11/18/19 1950)  . vancomycin 1,000 mg (12/08/19 1304)     LOS: 70 days    Time spent: 25 minutes spent in the coordination of care today.    Teddy Spike, DO [facility]  If 7PM-7AM, please contact night-coverage [email or web address] 12/08/2019, 2:59 PM

## 2019-12-09 ENCOUNTER — Inpatient Hospital Stay (HOSPITAL_COMMUNITY): Payer: Self-pay

## 2019-12-09 DIAGNOSIS — R609 Edema, unspecified: Secondary | ICD-10-CM

## 2019-12-09 LAB — BRAIN NATRIURETIC PEPTIDE: B Natriuretic Peptide: 36.7 pg/mL (ref 0.0–100.0)

## 2019-12-09 LAB — CBC WITH DIFFERENTIAL/PLATELET
Abs Immature Granulocytes: 0.02 10*3/uL (ref 0.00–0.07)
Basophils Absolute: 0.1 10*3/uL (ref 0.0–0.1)
Basophils Relative: 1 %
Eosinophils Absolute: 0.2 10*3/uL (ref 0.0–0.5)
Eosinophils Relative: 3 %
HCT: 29.9 % — ABNORMAL LOW (ref 36.0–46.0)
Hemoglobin: 9.4 g/dL — ABNORMAL LOW (ref 12.0–15.0)
Immature Granulocytes: 0 %
Lymphocytes Relative: 22 %
Lymphs Abs: 1.6 10*3/uL (ref 0.7–4.0)
MCH: 25.8 pg — ABNORMAL LOW (ref 26.0–34.0)
MCHC: 31.4 g/dL (ref 30.0–36.0)
MCV: 82.1 fL (ref 80.0–100.0)
Monocytes Absolute: 0.7 10*3/uL (ref 0.1–1.0)
Monocytes Relative: 10 %
Neutro Abs: 4.8 10*3/uL (ref 1.7–7.7)
Neutrophils Relative %: 64 %
Platelets: 463 10*3/uL — ABNORMAL HIGH (ref 150–400)
RBC: 3.64 MIL/uL — ABNORMAL LOW (ref 3.87–5.11)
RDW: 13.3 % (ref 11.5–15.5)
WBC: 7.5 10*3/uL (ref 4.0–10.5)
nRBC: 0 % (ref 0.0–0.2)

## 2019-12-09 LAB — RENAL FUNCTION PANEL
Albumin: 3.1 g/dL — ABNORMAL LOW (ref 3.5–5.0)
Anion gap: 8 (ref 5–15)
BUN: 8 mg/dL (ref 6–20)
CO2: 25 mmol/L (ref 22–32)
Calcium: 9 mg/dL (ref 8.9–10.3)
Chloride: 105 mmol/L (ref 98–111)
Creatinine, Ser: 0.63 mg/dL (ref 0.44–1.00)
GFR calc Af Amer: >60 mL/min (ref >60)
GFR calc non Af Amer: >60 mL/min (ref >60)
Glucose, Bld: 111 mg/dL — ABNORMAL HIGH (ref 70–99)
Phosphorus: 4.2 mg/dL (ref 2.5–4.6)
Potassium: 4.1 mmol/L (ref 3.5–5.1)
Sodium: 138 mmol/L (ref 135–145)

## 2019-12-09 LAB — MAGNESIUM: Magnesium: 2 mg/dL (ref 1.7–2.4)

## 2019-12-09 NOTE — Progress Notes (Signed)
PROGRESS NOTE    Colleen Ramirez  QQI:[id number] DOB: Dec 31, 1980 DOA: 09/29/2019 PCP: Patient, No Pcp Per   Brief Narrative:  Patient is Colleen Ramirez 39 year old female with history of IV drug abuse, anxiety, depression, anemia, status post gastric bypass who presented to the emergency department on 1/12 with complaints of 2-day history of sharp neck pain. She was recently hospitalized from 12/20-2/24 for facial cellulitis. MRI cervical spine showed discitis, osteomyelitis at C5-C6 and C6-7. Found to have prevertebral/retropharyngeal soft tissue phlegmonous inflammation with areas of nonenhancing material consistent with pus. Underwent C5-C6 anterior discectomy, instrumented fusion cultures positive for MRSA. Started on vancomycin, rifampin. Follow-up cervical MRI showed progression of findings consistent with discitis/osteomyelitis involving C4, C5, C6 and C7 vertebrae. Neurosurgery and ID recommended to continue aggressive medical therapy. Current plan is to continue IV antibiotics till March 26.  12/08/19: OT has reviewed. Rec outpt OT. Have left her with posture exercises. Spoke with ID to clarify discharge regimen. Recommendation is doxy '100mg'$  BID and rifabutin'300mg'$  both for Destony Prevost minimum of 3 months. I made Dr Megan Salon aware that the patient will be moving to [facility] immediately at discharge. He recommends quick establishment of care in her new location and follow up within the next few weeks. She is stable today. Vanc trough low. Pharmacy continuing to dose.   Assessment & Plan:   Principal Problem:   Abscess in epidural space of cervical spine Active Problems:   Depression   Acquired fusion of cervical spine   IVDU (intravenous drug user)   Homeless   S/P cervical discectomy   Anxiety   Status post gastric bypass for obesity   Hepatitis C antibody positive in blood   Acute bilateral thoracic back pain   Body aches   Methamphetamine abuse (HCC)   Pain of right sternoclavicular  joint  Cervical spine epidural abscess with vertebral osteomyelitis - Status post C4-C7 fusion - continue IV antibiotics till March 26. - Neurosurgery and ID were following. - Pain management w/ oral PRN diluadid; PRN valium/flexeril for spasm - 3/17: continue abx, continue narcotic wean - CM consulted for help with PCP/follow up - 3/18: continue abx, continue narcotic wean; she will need PCP follow up but she states she is moving to [facility]. Not much that we can do to help on that front. IV abx end on 3/26. She will be inpt until then. - 3/19: as above; check rpt MRI - 3/21: Rpt MRI shows slow improvement. IV abx through 3/26. Need to clarify with ID about PO regimen. Progressing well     - 3/23: spoke with ID to clarify discharge regimen. Recommendation is doxy '100mg'$  BID and rifabutin'300mg'$  both for Colleen Ramirez minimum of 3 months. Vanc trough low ; pharm continuing to dose. - stable on 3/24  History of IV drug abuse - Needs follow up at pain management clinic/drug rehabilitation  Hepatitis C - likely secondary to above - Needs to follow-up as an outpatient for hepatitis C management.   Bilateral upper extremity DVT - Ultrasound showed right upper extremity superficial and deep venous thrombosis of cephalic and brachial veins and right superficial vein thrombosis of basilic and cephalic vein.  - continue Xarelto  Bilateral LE Edema: follow BNP and LE [place]  Depression/anxiety - continue Adderall, aripiprazole, trazodone, bupropion.  Chronic normocytic anemia - Iron studies showed iron deficiency; on oral iron replacement - 3/18: Hgb is stable; no evidence of new bleed, monitor - 3/22: remains stable, monitor  Tobacco abuse - nicotine patch - advised against any further  drug and tobacco use  Congestion - claritin, mucinex DM, ocean nasal spray - 3/21: add flonase - 3/22: improved  congestion     - 3/23: resolved  Normocytic anemia     - no bleed noted; Hgb stable, monitor   DVT prophylaxis: xarelto Code Status: full Family Communication: none at bedside Disposition Plan:  . Patient came from: home            . Anticipated d/c place: home . Barriers to d/c OR conditions which need to be met to effect Daphnee Preiss safe d/c: pending completion of IV abx   Consultants:   ID  neurosurgery  Procedures:   C4-7 fusion  Antimicrobials:  Anti-infectives (From admission, onward)   Start     Dose/Rate Route Frequency Ordered Stop   12/05/19 0000  vancomycin (VANCOCIN) IVPB 1000 mg/200 mL premix     1,000 mg 200 mL/hr over 60 Minutes Intravenous Every 12 hours 12/04/19 1115     12/04/19 1130  vancomycin (VANCOCIN) IVPB 1000 mg/200 mL premix     1,000 mg 200 mL/hr over 60 Minutes Intravenous STAT 12/04/19 1050 12/04/19 1337   11/30/19 2200  vancomycin (VANCOREADY) IVPB 750 mg/150 mL  Status:  Discontinued     750 mg 150 mL/hr over 60 Minutes Intravenous Every 12 hours 11/30/19 0634 12/04/19 1050   11/10/19 1200  vancomycin (VANCOREADY) IVPB 1250 mg/250 mL  Status:  Discontinued     1,250 mg 166.7 mL/hr over 90 Minutes Intravenous Every 12 hours 11/10/19 0627 11/30/19 0634   11/09/19 2200  rifabutin (MYCOBUTIN) capsule 300 mg  Status:  Discontinued     300 mg Oral Every 12 hours 11/09/19 1136 11/09/19 1646   11/09/19 2200  rifabutin (MYCOBUTIN) capsule 300 mg     300 mg Oral Daily 11/09/19 1646     11/05/19 1600  vancomycin (VANCOCIN) IVPB 1000 mg/200 mL premix  Status:  Discontinued     1,000 mg 200 mL/hr over 60 Minutes Intravenous Every 12 hours 11/05/19 1504 11/10/19 0627   11/01/19 1400  vancomycin (VANCOREADY) IVPB 1500 mg/300 mL  Status:  Discontinued     1,500 mg 150 mL/hr over 120 Minutes Intravenous Every 12 hours 11/01/19 1305 11/05/19 0823   10/28/19 1000  vancomycin (VANCOREADY) IVPB 1500 mg/300 mL  Status:  Discontinued     1,500 mg 150 mL/hr over 120  Minutes Intravenous Every 12 hours 10/27/19 2353 11/01/19 1305   10/14/19 1000  vancomycin (VANCOREADY) IVPB 1250 mg/250 mL  Status:  Discontinued     1,250 mg 166.7 mL/hr over 90 Minutes Intravenous Every 12 hours 10/14/19 0540 10/27/19 2353   10/13/19 0945  Vancomycin (VANCOCIN) 1,250 mg in sodium chloride 0.9 % 250 mL IVPB  Status:  Discontinued     1,250 mg 166.7 mL/hr over 90 Minutes Intravenous Every 12 hours 10/13/19 0935 10/14/19 0540   10/13/19 0930  Vancomycin (VANCOCIN) 1,250 mg in sodium chloride 0.9 % 250 mL IVPB  Status:  Discontinued     1,250 mg 166.7 mL/hr over 90 Minutes Intravenous Every 12 hours 10/13/19 0902 10/13/19 0934   10/09/19 2000  vancomycin (VANCOCIN) IVPB 1000 mg/200 mL premix  Status:  Discontinued     1,000 mg 200 mL/hr over 60 Minutes Intravenous Every 12 hours 10/09/19 1121 10/13/19 0902   10/04/19 2200  vancomycin (VANCOCIN) IVPB 750 mg/150 ml premix  Status:  Discontinued     750 mg 150 mL/hr over 60 Minutes Intravenous Every 8 hours 10/04/19 1305 10/09/19 1121  10/04/19 1400  vancomycin (VANCOCIN) IVPB 1000 mg/200 mL premix     1,000 mg 200 mL/hr over 60 Minutes Intravenous STAT 10/04/19 1305 10/04/19 1604   10/01/19 1100  rifampin (RIFADIN) capsule 300 mg  Status:  Discontinued     300 mg Oral Every 12 hours 10/01/19 1056 11/09/19 1136   09/30/19 1800  Vancomycin (VANCOCIN) 1,250 mg in sodium chloride 0.9 % 250 mL IVPB  Status:  Discontinued     1,250 mg 166.7 mL/hr over 90 Minutes Intravenous Every 24 hours 09/30/19 0003 09/30/19 0747   09/30/19 1800  vancomycin (VANCOREADY) IVPB 1250 mg/250 mL  Status:  Discontinued     1,250 mg 166.7 mL/hr over 90 Minutes Intravenous Every 24 hours 09/30/19 0746 10/04/19 1305   09/30/19 0400  piperacillin-tazobactam (ZOSYN) IVPB 3.375 g  Status:  Discontinued     3.375 g 12.5 mL/hr over 240 Minutes Intravenous Every 8 hours 09/30/19 0003 09/30/19 1533   09/29/19 2145  piperacillin-tazobactam (ZOSYN) IVPB 3.375  g     3.375 g 12.5 mL/hr over 240 Minutes Intravenous STAT 09/29/19 2137 09/29/19 2148   09/29/19 2119  bacitracin 50,000 Units in sodium chloride 0.9 % 500 mL irrigation  Status:  Discontinued       As needed 09/29/19 2119 09/29/19 2249   09/29/19 2033  vancomycin (VANCOCIN) 1-5 GM/200ML-% IVPB    Note to Pharmacy: Ubaldo Glassing   : cabinet override      09/29/19 2033 09/30/19 0844         Subjective: C/o continued neck pain - dialaudid PO helps, but does not eliminate pain  Objective: Vitals:   12/08/19 1905 12/08/19 2030 12/09/19 0530 12/09/19 0810  BP: (!) 118/92 (!) 118/92 121/87 115/85  Pulse: 98 (!) 103 99 98  Resp: '18 18 18 17  '$ Temp: 98.2 F (36.8 C) 98.2 F (36.8 C) 99.3 F (37.4 C) 98.8 F (37.1 C)  TempSrc: Oral Oral Oral Oral  SpO2: 99% 100% 100% 100%  Weight:      Height:        Intake/Output Summary (Last 24 hours) at 12/09/2019 1352 Last data filed at 12/09/2019 1000 Gross per 24 hour  Intake 2460.06 ml  Output --  Net 2460.06 ml   Filed Weights   09/29/19 2032  Weight: 49.9 kg    Examination:  General exam: Appears calm and comfortable  Respiratory system: Clear to auscultation. Respiratory effort normal. Cardiovascular system: S1 & S2 heard, RRR.  Gastrointestinal system: Abdomen is nondistended, soft and nontender. Central nervous system: Alert and oriented. No focal neurological deficits. Extremities: 1+ bilateral LE edema Skin: No rashes, lesions or ulcers Psychiatry: Judgement and insight appear normal. Mood & affect appropriate.     Data Reviewed: I have personally reviewed following labs and imaging studies  CBC: Recent Labs  Lab 12/04/19 0601 12/06/19 0343 12/07/19 0505 12/08/19 0342 12/09/19 0500  WBC 5.0 6.3 5.4 6.6 7.5  NEUTROABS 2.8 3.9 2.9 4.0 4.8  HGB 9.0* 7.9* 8.2* 8.9* 9.4*  HCT 29.5* 25.7* 26.4* 28.7* 29.9*  MCV 85.3 83.4 83.8 83.2 82.1  PLT 374 373 417* 407* 532*   Basic Metabolic Panel: Recent Labs  Lab  12/04/19 0245 12/04/19 0245 12/05/19 0345 12/06/19 0343 12/07/19 0505 12/08/19 0342 12/09/19 0500  NA 139   < > 139 140 141 139 138  K 3.9   < > 4.0 3.5 3.9 3.9 4.1  CL 105   < > 105 105 107 105 105  CO2 26   < >  $'26 27 26 26 25  'O$ GLUCOSE 101*   < > 103* 90 113* 147* 111*  BUN 8   < > '7 7 10 10 8  '$ CREATININE 0.53   < > 0.52 0.60 0.59 0.67 0.63  CALCIUM 8.4*   < > 8.5* 9.0 9.0 9.0 9.0  MG 1.8  --   --  1.8 1.8 1.8 2.0  PHOS  --   --   --   --   --   --  4.2   < > = values in this interval not displayed.   GFR: Estimated Creatinine Clearance: 74.4 mL/min (by C-G formula based on SCr of 0.63 mg/dL). Liver Function Tests: Recent Labs  Lab 12/03/19 0400 12/03/19 0400 12/04/19 0245 12/06/19 0343 12/07/19 0505 12/08/19 0342 12/09/19 0500  AST 32  --  37 '23 21 22  '$ --   ALT 22  --  '24 22 20 22  '$ --   ALKPHOS 76  --  77 79 82 85  --   BILITOT 0.5  --  0.5 0.6 0.4 0.5  --   PROT 6.4*  --  6.3* 6.6 6.7 7.2  --   ALBUMIN 2.5*   < > 2.3* 2.5* 2.6* 2.9* 3.1*   < > = values in this interval not displayed.   No results for input(s): LIPASE, AMYLASE in the last 168 hours. No results for input(s): AMMONIA in the last 168 hours. Coagulation Profile: No results for input(s): INR, PROTIME in the last 168 hours. Cardiac Enzymes: No results for input(s): CKTOTAL, CKMB, CKMBINDEX, TROPONINI in the last 168 hours. BNP (last 3 results) No results for input(s): PROBNP in the last 8760 hours. HbA1C: No results for input(s): HGBA1C in the last 72 hours. CBG: No results for input(s): GLUCAP in the last 168 hours. Lipid Profile: No results for input(s): CHOL, HDL, LDLCALC, TRIG, CHOLHDL, LDLDIRECT in the last 72 hours. Thyroid Function Tests: No results for input(s): TSH, T4TOTAL, FREET4, T3FREE, THYROIDAB in the last 72 hours. Anemia Panel: No results for input(s): VITAMINB12, FOLATE, FERRITIN, TIBC, IRON, RETICCTPCT in the last 72 hours. Sepsis Labs: No results for input(s): PROCALCITON,  LATICACIDVEN in the last 168 hours.  No results found for this or any previous visit (from the past 240 hour(s)).       Radiology Studies: No results found.      Scheduled Meds: . acetaminophen  1,000 mg Oral TID  . amphetamine-dextroamphetamine  30 mg Oral BID  . ARIPiprazole  20 mg Oral Daily  . buPROPion  150 mg Oral Daily  . Chlorhexidine Gluconate Cloth  6 each Topical Q0600  . dextromethorphan-guaiFENesin  1 tablet Oral BID  . diclofenac Sodium  4 g Topical QID  . feeding supplement (ENSURE ENLIVE)  237 mL Oral BID BM  . ferrous sulfate  325 mg Oral Q breakfast  . fluticasone  1 spray Each Nare Daily  . gabapentin  100 mg Oral TID  . influenza vac split quadrivalent PF  0.5 mL Intramuscular Tomorrow-1000  . loratadine  10 mg Oral Daily  . multivitamin with minerals  1 tablet Oral Daily  . nicotine  14 mg Transdermal Daily  . rifabutin  300 mg Oral Daily  . rivaroxaban  20 mg Oral Daily  . senna-docusate  1 tablet Oral BID  . sodium chloride flush  10-40 mL Intracatheter Q12H   Continuous Infusions: . sodium chloride Stopped (11/18/19 1950)  . vancomycin 1,000 mg (12/09/19 1314)     LOS:  71 days    Time spent: over 30 min    Fayrene Helper, MD [facility]   To contact the attending provider between 7A-7P or the covering provider during after hours 7P-7A, please log into the web site [email or web address] and access using universal [place] password for that web site. If you do not have the password, please call the hospital operator.  12/09/2019, 1:52 PM

## 2019-12-09 NOTE — Plan of Care (Signed)
  Problem: Clinical Measurements: Goal: Will remain free from infection Outcome: Progressing   Problem: Nutrition: Goal: Adequate nutrition will be maintained Outcome: Progressing   Problem: Coping: Goal: Level of anxiety will decrease Outcome: Progressing   Problem: Elimination: Goal: Will not experience complications related to bowel motility Outcome: Progressing   Problem: Pain Managment: Goal: General experience of comfort will improve Outcome: Progressing   Problem: Safety: Goal: Ability to remain free from injury will improve Outcome: Progressing   Problem: Skin Integrity: Goal: Risk for impaired skin integrity will decrease Outcome: Progressing   

## 2019-12-09 NOTE — Progress Notes (Signed)
Bilateral lower extremity venous duplex exam completed.  Preliminary results can be found under CV proc under chart review.  12/09/2019 3:39 PM  Marijane Trower, K., RDMS, RVT

## 2019-12-10 ENCOUNTER — Inpatient Hospital Stay (HOSPITAL_COMMUNITY): Payer: Self-pay

## 2019-12-10 LAB — CBC WITH DIFFERENTIAL/PLATELET
Abs Immature Granulocytes: 0.04 10*3/uL (ref 0.00–0.07)
Basophils Absolute: 0.1 10*3/uL (ref 0.0–0.1)
Basophils Relative: 1 %
Eosinophils Absolute: 0.2 10*3/uL (ref 0.0–0.5)
Eosinophils Relative: 2 %
HCT: 26.4 % — ABNORMAL LOW (ref 36.0–46.0)
Hemoglobin: 8 g/dL — ABNORMAL LOW (ref 12.0–15.0)
Immature Granulocytes: 1 %
Lymphocytes Relative: 14 %
Lymphs Abs: 1.2 10*3/uL (ref 0.7–4.0)
MCH: 25.7 pg — ABNORMAL LOW (ref 26.0–34.0)
MCHC: 30.3 g/dL (ref 30.0–36.0)
MCV: 84.9 fL (ref 80.0–100.0)
Monocytes Absolute: 0.7 10*3/uL (ref 0.1–1.0)
Monocytes Relative: 8 %
Neutro Abs: 6.7 10*3/uL (ref 1.7–7.7)
Neutrophils Relative %: 74 %
Platelets: 394 10*3/uL (ref 150–400)
RBC: 3.11 MIL/uL — ABNORMAL LOW (ref 3.87–5.11)
RDW: 13.5 % (ref 11.5–15.5)
WBC: 8.8 10*3/uL (ref 4.0–10.5)
nRBC: 0 % (ref 0.0–0.2)

## 2019-12-10 LAB — COMPREHENSIVE METABOLIC PANEL
ALT: 17 U/L (ref 0–44)
AST: 19 U/L (ref 15–41)
Albumin: 2.5 g/dL — ABNORMAL LOW (ref 3.5–5.0)
Alkaline Phosphatase: 75 U/L (ref 38–126)
Anion gap: 7 (ref 5–15)
BUN: 10 mg/dL (ref 6–20)
CO2: 26 mmol/L (ref 22–32)
Calcium: 8.7 mg/dL — ABNORMAL LOW (ref 8.9–10.3)
Chloride: 104 mmol/L (ref 98–111)
Creatinine, Ser: 0.58 mg/dL (ref 0.44–1.00)
GFR calc Af Amer: >60 mL/min (ref >60)
GFR calc non Af Amer: >60 mL/min (ref >60)
Glucose, Bld: 123 mg/dL — ABNORMAL HIGH (ref 70–99)
Potassium: 3.8 mmol/L (ref 3.5–5.1)
Sodium: 137 mmol/L (ref 135–145)
Total Bilirubin: 0.5 mg/dL (ref 0.3–1.2)
Total Protein: 6.3 g/dL — ABNORMAL LOW (ref 6.5–8.1)

## 2019-12-10 LAB — MAGNESIUM: Magnesium: 1.8 mg/dL (ref 1.7–2.4)

## 2019-12-10 LAB — PHOSPHORUS: Phosphorus: 4 mg/dL (ref 2.5–4.6)

## 2019-12-10 MED ORDER — FOLIC ACID 1 MG PO TABS
1.0000 mg | ORAL_TABLET | Freq: Every day | ORAL | Status: DC
  Administered 2019-12-10 – 2019-12-11 (×2): 1 mg via ORAL
  Filled 2019-12-10 (×2): qty 1

## 2019-12-10 NOTE — Progress Notes (Addendum)
PROGRESS NOTE    Colleen Ramirez  ZOX:[id number] DOB: 02-28-1981 DOA: 09/29/2019 PCP: Patient, No Pcp Per   Brief Narrative:  Patient is Colleen Ramirez 39 year old female with history of IV drug abuse, anxiety, depression, anemia, status post gastric bypass who presented to the emergency department on 1/12 with complaints of 2-day history of sharp neck pain. She was recently hospitalized from 12/20-2/24 for facial cellulitis. MRI cervical spine showed discitis, osteomyelitis at C5-C6 and C6-7. Found to have prevertebral/retropharyngeal soft tissue phlegmonous inflammation with areas of nonenhancing material consistent with pus. Underwent C5-C6 anterior discectomy, instrumented fusion cultures positive for MRSA. Started on vancomycin, rifampin. Follow-up cervical MRI showed progression of findings consistent with discitis/osteomyelitis involving C4, C5, C6 and C7 vertebrae. Neurosurgery and ID recommended to continue aggressive medical therapy. Current plan is to continue IV antibiotics till March 26.  12/08/19: OT has reviewed. Rec outpt OT. Have left her with posture exercises. Spoke with ID to clarify discharge regimen. Recommendation is doxy 155m BID and rifabutin3021mboth for [name] minimum of 3 months. I made Dr CaMegan Salonware [facility]. He recommends quick establishment of care in her new location and follow up within the next few weeks. She is stable today. Vanc trough low. Pharmacy continuing to dose.   Assessment & Plan:   Principal Problem:   Abscess in epidural space of cervical spine Active Problems:   Depression   Acquired fusion of cervical spine   IVDU (intravenous drug user)   Homeless   S/P cervical discectomy   Anxiety   Status post gastric bypass for obesity   Hepatitis C antibody positive in blood   Acute bilateral thoracic back pain   Body aches   Methamphetamine abuse (HCC)   Pain of right sternoclavicular  joint  Cervical spine epidural abscess with vertebral osteomyelitis - Status post C4-C7 fusion - continue IV antibiotics till March 26. - Neurosurgery and ID were following. - Pain management w/ oral PRN diluadid; PRN flexeril for spasm - she's been on narcotics pretty much this entire hospitalization for pain.  Discussed with family who plan to help with medication management outpatient.  Will plan to continue current dose x5 days outpatient, further management will be needed outpatient. - CM consulted for help with PCP/follow up - family is planning to help with this as [place]     - 3/23: spoke with ID to clarify discharge regimen. Recommendation is doxy 10059mID and rifabutin300m46mth for [name] minimum of 3 months. Vanc trough low ; pharm continuing to dose. - stable on 3/25 -> c/o neck pain - will get plain films, Dr. OsteZada Findersl see tomorrow  History of IV drug abuse - Needs follow up at pain management clinic/drug rehabilitation  Hepatitis C - likely secondary to above - Needs to follow-up as an outpatient for hepatitis C management.   Bilateral upper extremity DVT - Ultrasound showed right upper extremity superficial and deep venous thrombosis of cephalic and brachial veins and right superficial vein thrombosis of basilic and cephalic vein.  - continue Xarelto  Bilateral LE Edema: follow BNP (wnl) and LE US ([place] for DVT)  Depression/anxiety - continue Adderall, aripiprazole, trazodone, bupropion.  Chronic normocytic anemia   Iron Def Anemia   Folate Deficiency - Iron studies showed iron deficiency; on oral iron replacement - 3/18: Hgb is stable; no evidence of new bleed, monitor - 3/22: remains stable, monitor - labs  from 07/25/2019 show folate def, start folate replacement - repeat B12/folate  Tobacco abuse - nicotine patch - advised  against any further drug and tobacco use  Congestion - claritin, mucinex DM, ocean nasal spray - 3/21: add flonase - 3/22: improved congestion     - 3/23: resolved  Normocytic anemia     - no bleed noted; Hgb stable, monitor   DVT prophylaxis: xarelto Code Status: full Family Communication: none at bedside Disposition Plan:   Patient came from: home             Anticipated d/c place: home  Barriers to d/c OR conditions which need to be met to effect [place] safe d/c: pending completion of IV abx -> discharge 3/26   Consultants:   ID  neurosurgery  Procedures:   C4-7 fusion  Antimicrobials:  Anti-infectives (From admission, onward)   Start     Dose/Rate Route Frequency Ordered Stop   12/05/19 0000  vancomycin (VANCOCIN) IVPB 1000 mg/200 mL premix     1,000 mg 200 mL/hr over 60 Minutes Intravenous Every 12 hours 12/04/19 1115 12/11/19 1300   12/04/19 1130  vancomycin (VANCOCIN) IVPB 1000 mg/200 mL premix     1,000 mg 200 mL/hr over 60 Minutes Intravenous STAT 12/04/19 1050 12/04/19 1337   11/30/19 2200  vancomycin (VANCOREADY) IVPB 750 mg/150 mL  Status:  Discontinued     750 mg 150 mL/hr over 60 Minutes Intravenous Every 12 hours 11/30/19 0634 12/04/19 1050   11/10/19 1200  vancomycin (VANCOREADY) IVPB 1250 mg/250 mL  Status:  Discontinued     1,250 mg 166.7 mL/hr over 90 Minutes Intravenous Every 12 hours 11/10/19 0627 11/30/19 0634   11/09/19 2200  rifabutin (MYCOBUTIN) capsule 300 mg  Status:  Discontinued     300 mg Oral Every 12 hours 11/09/19 1136 11/09/19 1646   11/09/19 2200  rifabutin (MYCOBUTIN) capsule 300 mg     300 mg Oral Daily 11/09/19 1646     11/05/19 1600  vancomycin (VANCOCIN) IVPB 1000 mg/200 mL premix  Status:  Discontinued     1,000 mg 200 mL/hr over 60 Minutes Intravenous Every 12 hours 11/05/19 1504 11/10/19 0627   11/01/19 1400  vancomycin (VANCOREADY) IVPB 1500 mg/300 mL  Status:  Discontinued     1,500 mg 150 mL/hr over 120  Minutes Intravenous Every 12 hours 11/01/19 1305 11/05/19 0823   10/28/19 1000  vancomycin (VANCOREADY) IVPB 1500 mg/300 mL  Status:  Discontinued     1,500 mg 150 mL/hr over 120 Minutes Intravenous Every 12 hours 10/27/19 2353 11/01/19 1305   10/14/19 1000  vancomycin (VANCOREADY) IVPB 1250 mg/250 mL  Status:  Discontinued     1,250 mg 166.7 mL/hr over 90 Minutes Intravenous Every 12 hours 10/14/19 0540 10/27/19 2353   10/13/19 0945  Vancomycin (VANCOCIN) 1,250 mg in sodium chloride 0.9 % 250 mL IVPB  Status:  Discontinued     1,250 mg 166.7 mL/hr over 90 Minutes Intravenous Every 12 hours 10/13/19 0935 10/14/19 0540   10/13/19 0930  Vancomycin (VANCOCIN) 1,250 mg in sodium chloride 0.9 % 250 mL IVPB  Status:  Discontinued     1,250 mg 166.7 mL/hr over 90 Minutes Intravenous Every 12 hours 10/13/19 0902 10/13/19 0934   10/09/19 2000  vancomycin (VANCOCIN) IVPB 1000 mg/200 mL premix  Status:  Discontinued     1,000 mg 200 mL/hr over 60 Minutes Intravenous Every 12 hours 10/09/19 1121 10/13/19 0902   10/04/19 2200  vancomycin (VANCOCIN) IVPB 750 mg/150  ml premix  Status:  Discontinued     750 mg 150 mL/hr over 60 Minutes Intravenous Every 8 hours 10/04/19 1305 10/09/19 1121   10/04/19 1400  vancomycin (VANCOCIN) IVPB 1000 mg/200 mL premix     1,000 mg 200 mL/hr over 60 Minutes Intravenous STAT 10/04/19 1305 10/04/19 1604   10/01/19 1100  rifampin (RIFADIN) capsule 300 mg  Status:  Discontinued     300 mg Oral Every 12 hours 10/01/19 1056 11/09/19 1136   09/30/19 1800  Vancomycin (VANCOCIN) 1,250 mg in sodium chloride 0.9 % 250 mL IVPB  Status:  Discontinued     1,250 mg 166.7 mL/hr over 90 Minutes Intravenous Every 24 hours 09/30/19 0003 09/30/19 0747   09/30/19 1800  vancomycin (VANCOREADY) IVPB 1250 mg/250 mL  Status:  Discontinued     1,250 mg 166.7 mL/hr over 90 Minutes Intravenous Every 24 hours 09/30/19 0746 10/04/19 1305   09/30/19 0400  piperacillin-tazobactam (ZOSYN) IVPB 3.375  g  Status:  Discontinued     3.375 g 12.5 mL/hr over 240 Minutes Intravenous Every 8 hours 09/30/19 0003 09/30/19 1533   09/29/19 2145  piperacillin-tazobactam (ZOSYN) IVPB 3.375 g     3.375 g 12.5 mL/hr over 240 Minutes Intravenous STAT 09/29/19 2137 09/29/19 2148   09/29/19 2119  bacitracin 50,000 Units in sodium chloride 0.9 % 500 mL irrigation  Status:  Discontinued       As needed 09/29/19 2119 09/29/19 2249   09/29/19 2033  vancomycin (VANCOCIN) 1-5 GM/200ML-% IVPB    Note to Pharmacy: Ubaldo Glassing   : cabinet override      09/29/19 2033 09/30/19 0844         Subjective: New neck discomfort today, L side of neck, no numbness, tingling, weakness of arms  Objective: Vitals:   12/09/19 2121 12/10/19 0500 12/10/19 0810 12/10/19 1405  BP: 113/65 110/74 121/83 108/68  Pulse: 100 100 (!) 108 98  Resp: _0 Temp: 97.7 F (36.5 C) 98.6 F (37 C) 98.8 F (37.1 C) 98.7 F (37.1 C)  TempSrc: Oral Oral Oral Oral  SpO2: 100% 100% 99% 100%  Weight:      Height:        Intake/Output Summary (Last 24 hours) at 12/10/2019 1500 Last data filed at 12/10/2019 0900 Gross per 24 hour  Intake 1839.46 ml  Output --  Net 1839.46 ml   Filed Weights   09/29/19 2032  Weight: 49.9 kg    Examination:  General: No acute distress. Cardiovascular: Heart sounds show Annalia Metzger regular rate, and rhythm.  Lungs: Clear to auscultation bilaterally Abdomen: Soft, nontender, nondistended Neurological: Alert and oriented 3. Moves all extremities 4. Cranial nerves II through XII grossly intact. Skin: Warm and dry. No rashes or lesions. Extremities: No clubbing or cyanosis. No edema.  Data Reviewed: I have personally reviewed following labs and imaging studies  CBC: Recent Labs  Lab 12/06/19 0343 12/07/19 0505 12/08/19 0342 12/09/19 0500 12/10/19 0420  WBC 6.3 5.4 6.6 7.5 8.8  NEUTROABS 3.9 2.9 4.0 4.8 6.7  HGB 7.9* 8.2* 8.9* 9.4* 8.0*  HCT 25.7* 26.4* 28.7* 29.9* 26.4*  MCV 83.4  83.8 83.2 82.1 84.9  PLT 373 417* 407* 463* 244   Basic Metabolic Panel: Recent Labs  Lab 12/06/19 0343 12/07/19 0505 12/08/19 0342 12/09/19 0500 12/10/19 0420  NA 140 141 139 138 137  K 3.5 3.9 3.9 4.1 3.8  CL 105 107 105 105 104  CO2 _1 26  GLUCOSE 90 113* 147* 111* 123*  BUN _0 CREATININE 0.60 0.59 0.67 0.63 0.58  CALCIUM 9.0 9.0 9.0 9.0 8.7*  MG 1.8 1.8 1.8 2.0 1.8  PHOS  --   --   --  4.2 4.0   GFR: Estimated Creatinine Clearance: 74.4 mL/min (by C-G formula based on SCr of 0.58 mg/dL). Liver Function Tests: Recent Labs  Lab 12/04/19 0245 12/04/19 0245 12/06/19 0343 12/07/19 0505 12/08/19 0342 12/09/19 0500 12/10/19 0420  AST 37  --  _1 --  19  ALT 24  --  _2 --  17  ALKPHOS 77  --  79 82 85  --  75  BILITOT 0.5  --  0.6 0.4 0.5  --  0.5  PROT 6.3*  --  6.6 6.7 7.2  --  6.3*  ALBUMIN 2.3*   < > 2.5* 2.6* 2.9* 3.1* 2.5*   < > = values in this interval not displayed.   No results for input(s): LIPASE, AMYLASE in the last 168 hours. No results for input(s): AMMONIA in the last 168 hours. Coagulation Profile: No results for input(s): INR, PROTIME in the last 168 hours. Cardiac Enzymes: No results for input(s): CKTOTAL, CKMB, CKMBINDEX, TROPONINI in the last 168 hours. BNP (last 3 results) No results for input(s): PROBNP in the last 8760 hours. HbA1C: No results for input(s): HGBA1C in the last 72 hours. CBG: No results for input(s): GLUCAP in the last 168 hours. Lipid Profile: No results for input(s): CHOL, HDL, LDLCALC, TRIG, CHOLHDL, LDLDIRECT in the last 72 hours. Thyroid Function Tests: No results for input(s): TSH, T4TOTAL, FREET4, T3FREE, THYROIDAB in the last 72 hours. Anemia Panel: No results for input(s): VITAMINB12, FOLATE, FERRITIN, TIBC, IRON, RETICCTPCT in the last 72 hours. Sepsis Labs: No results for input(s): PROCALCITON, LATICACIDVEN in the last 168 hours.  No results found for this or any previous  visit (from the past 240 hour(s)).       Radiology Studies: VAS [place] LOWER EXTREMITY VENOUS (DVT)  Result Date: 12/09/2019  Lower Venous DVTStudy Indications: Swelling.  Comparison Study: Bilateral upper extremity venous 10-31-19 and 11-02-19,                   positive. Performing Technologist: Baldwin Crown ARDMS, RVT  Examination Guidelines: Negin Hegg complete evaluation includes B-mode imaging, spectral Doppler, color Doppler, and power Doppler as needed of all accessible portions of each vessel. Bilateral testing is considered an integral part of Brannon Levene complete examination. Limited examinations for reoccurring indications may be performed as noted. The reflux portion of the exam is performed with the patient in reverse Trendelenburg.  +---------+---------------+---------+-----------+----------+--------------+  RIGHT     Compressibility Phasicity Spontaneity Properties Thrombus Aging  +---------+---------------+---------+-----------+----------+--------------+  CFV       Full            Yes       Yes                                    +---------+---------------+---------+-----------+----------+--------------+  SFJ       Full                                                             +---------+---------------+---------+-----------+----------+--------------+  FV Prox   Full                                                             +---------+---------------+---------+-----------+----------+--------------+  FV Mid    Full                                                             +---------+---------------+---------+-----------+----------+--------------+  FV Distal Full                                                             +---------+---------------+---------+-----------+----------+--------------+  PFV       Full                                                             +---------+---------------+---------+-----------+----------+--------------+  POP       Full            Yes       Yes                                     +---------+---------------+---------+-----------+----------+--------------+  PTV       Full                                                             +---------+---------------+---------+-----------+----------+--------------+  PERO      Full                                                             +---------+---------------+---------+-----------+----------+--------------+   +---------+---------------+---------+-----------+----------+--------------+  LEFT      Compressibility Phasicity Spontaneity Properties Thrombus Aging  +---------+---------------+---------+-----------+----------+--------------+  CFV       Full            Yes       Yes                                    +---------+---------------+---------+-----------+----------+--------------+  SFJ       Full                                                             +---------+---------------+---------+-----------+----------+--------------+  FV Prox   Full                                                             +---------+---------------+---------+-----------+----------+--------------+  FV Mid    Full                                                             +---------+---------------+---------+-----------+----------+--------------+  FV Distal Full                                                             +---------+---------------+---------+-----------+----------+--------------+  PFV       Full                                                             +---------+---------------+---------+-----------+----------+--------------+  POP       Full            Yes       Yes                                    +---------+---------------+---------+-----------+----------+--------------+  PTV       Full                                                             +---------+---------------+---------+-----------+----------+--------------+  PERO      Full                                                              +---------+---------------+---------+-----------+----------+--------------+     Summary: BILATERAL: - No evidence of deep vein thrombosis seen in the lower extremities, bilaterally.  RIGHT: - No cystic structure found in the popliteal fossa.  LEFT: - No cystic structure found in the popliteal fossa.  *See table(s) above for measurements and observations. Electronically signed by Ruta Hinds MD on 12/09/2019 at 4:00:02 PM.    Final         Scheduled Meds:  acetaminophen  1,000 mg Oral TID   amphetamine-dextroamphetamine  30 mg Oral BID   ARIPiprazole  20 mg Oral Daily   buPROPion  150 mg Oral Daily   Chlorhexidine Gluconate Cloth  6 each Topical Q0600   dextromethorphan-guaiFENesin  1 tablet Oral BID   diclofenac Sodium  4 g Topical QID   feeding supplement (ENSURE ENLIVE)  237 mL Oral BID BM   ferrous sulfate  325 mg Oral Q breakfast   fluticasone  1 spray Each Nare Daily   gabapentin  100 mg Oral TID   influenza vac split quadrivalent PF  0.5 mL Intramuscular Tomorrow-1000   loratadine  10 mg Oral Daily   multivitamin with minerals  1 tablet Oral Daily   nicotine  14 mg Transdermal Daily   rifabutin  300 mg Oral Daily   rivaroxaban  20 mg Oral Daily   senna-docusate  1 tablet Oral BID   sodium chloride flush  10-40 mL Intracatheter Q12H   Continuous Infusions:  sodium chloride Stopped (11/18/19 1950)   vancomycin 1,000 mg (12/10/19 1024)     LOS: 72 days    Time spent: over 30 min    Fayrene Helper, MD [facility]   To contact the attending provider between 7A-7P or the covering provider during after hours 7P-7A, please log into the web site [email or web address] and access using universal [place] password for that web site. If you do not have the password, please call the hospital operator.  12/10/2019, 3:00 PM

## 2019-12-10 NOTE — Plan of Care (Signed)
  Problem: Safety: Goal: Ability to remain free from injury will improve Outcome: Progressing   

## 2019-12-11 LAB — PHOSPHORUS: Phosphorus: 3.5 mg/dL (ref 2.5–4.6)

## 2019-12-11 LAB — CBC WITH DIFFERENTIAL/PLATELET
Abs Immature Granulocytes: 0.02 10*3/uL (ref 0.00–0.07)
Basophils Absolute: 0 10*3/uL (ref 0.0–0.1)
Basophils Relative: 1 %
Eosinophils Absolute: 0.2 10*3/uL (ref 0.0–0.5)
Eosinophils Relative: 3 %
HCT: 27 % — ABNORMAL LOW (ref 36.0–46.0)
Hemoglobin: 8.2 g/dL — ABNORMAL LOW (ref 12.0–15.0)
Immature Granulocytes: 0 %
Lymphocytes Relative: 25 %
Lymphs Abs: 1.5 10*3/uL (ref 0.7–4.0)
MCH: 25.8 pg — ABNORMAL LOW (ref 26.0–34.0)
MCHC: 30.4 g/dL (ref 30.0–36.0)
MCV: 84.9 fL (ref 80.0–100.0)
Monocytes Absolute: 0.4 10*3/uL (ref 0.1–1.0)
Monocytes Relative: 7 %
Neutro Abs: 3.7 10*3/uL (ref 1.7–7.7)
Neutrophils Relative %: 64 %
Platelets: 339 10*3/uL (ref 150–400)
RBC: 3.18 MIL/uL — ABNORMAL LOW (ref 3.87–5.11)
RDW: 13.2 % (ref 11.5–15.5)
WBC: 5.8 10*3/uL (ref 4.0–10.5)
nRBC: 0 % (ref 0.0–0.2)

## 2019-12-11 LAB — COMPREHENSIVE METABOLIC PANEL
ALT: 16 U/L (ref 0–44)
AST: 17 U/L (ref 15–41)
Albumin: 2.6 g/dL — ABNORMAL LOW (ref 3.5–5.0)
Alkaline Phosphatase: 72 U/L (ref 38–126)
Anion gap: 8 (ref 5–15)
BUN: 11 mg/dL (ref 6–20)
CO2: 24 mmol/L (ref 22–32)
Calcium: 8.8 mg/dL — ABNORMAL LOW (ref 8.9–10.3)
Chloride: 104 mmol/L (ref 98–111)
Creatinine, Ser: 0.69 mg/dL (ref 0.44–1.00)
GFR calc Af Amer: >60 mL/min (ref >60)
GFR calc non Af Amer: >60 mL/min (ref >60)
Glucose, Bld: 135 mg/dL — ABNORMAL HIGH (ref 70–99)
Potassium: 3.3 mmol/L — ABNORMAL LOW (ref 3.5–5.1)
Sodium: 136 mmol/L (ref 135–145)
Total Bilirubin: 0.3 mg/dL (ref 0.3–1.2)
Total Protein: 6.4 g/dL — ABNORMAL LOW (ref 6.5–8.1)

## 2019-12-11 LAB — FOLATE: Folate: 22.3 ng/mL (ref >5.9)

## 2019-12-11 LAB — VITAMIN B12: Vitamin B-12: 308 pg/mL (ref 180–914)

## 2019-12-11 LAB — MAGNESIUM: Magnesium: 1.8 mg/dL (ref 1.7–2.4)

## 2019-12-11 MED ORDER — FERROUS SULFATE 325 (65 FE) MG PO TABS: 325.0000 mg | ORAL_TABLET | Freq: Every day | ORAL | 0 refills | Status: AC

## 2019-12-11 MED ORDER — HYDROMORPHONE HCL 4 MG PO TABS: 4.0000 mg | ORAL_TABLET | Freq: Four times a day (QID) | ORAL | 0 refills | Status: AC | PRN

## 2019-12-11 MED ORDER — RIVAROXABAN 20 MG PO TABS: 20.0000 mg | ORAL_TABLET | Freq: Every day | ORAL | 0 refills | Status: AC

## 2019-12-11 MED ORDER — ARIPIPRAZOLE 20 MG PO TABS: 20.0000 mg | ORAL_TABLET | Freq: Every day | ORAL | 0 refills | Status: AC

## 2019-12-11 MED ORDER — RIFABUTIN 150 MG PO CAPS: 300.0000 mg | ORAL_CAPSULE | Freq: Every day | ORAL | 0 refills | Status: AC

## 2019-12-11 MED ORDER — TRAZODONE HCL 100 MG PO TABS: 100.0000 mg | ORAL_TABLET | Freq: Every evening | ORAL | 0 refills | Status: AC | PRN

## 2019-12-11 MED ORDER — ADULT MULTIVITAMIN W/MINERALS CH: 1.0000 | ORAL_TABLET | Freq: Every day | ORAL | 0 refills | Status: AC

## 2019-12-11 MED ORDER — POLYETHYLENE GLYCOL 3350 17 G PO PACK: 17.0000 g | PACK | Freq: Every day | ORAL | 0 refills | Status: AC | PRN

## 2019-12-11 MED ORDER — RIFABUTIN 150 MG PO CAPS: 300.0000 mg | ORAL_CAPSULE | Freq: Every day | ORAL | 0 refills | Status: DC

## 2019-12-11 MED ORDER — DICLOFENAC SODIUM 1 % EX GEL: 4.0000 g | Freq: Four times a day (QID) | CUTANEOUS | 0 refills | Status: AC | PRN

## 2019-12-11 MED ORDER — DOXYCYCLINE MONOHYDRATE 100 MG PO TABS: 100.0000 mg | ORAL_TABLET | Freq: Two times a day (BID) | ORAL | 0 refills | Status: AC

## 2019-12-11 MED ORDER — BUPROPION HCL ER (XL) 150 MG PO TB24: 150.0000 mg | ORAL_TABLET | Freq: Every day | ORAL | 0 refills | Status: AC

## 2019-12-11 MED ORDER — NICOTINE 14 MG/24HR TD PT24: 14.0000 mg | MEDICATED_PATCH | Freq: Every day | TRANSDERMAL | 0 refills | Status: AC

## 2019-12-11 MED ORDER — GABAPENTIN 100 MG PO CAPS: 100.0000 mg | ORAL_CAPSULE | Freq: Three times a day (TID) | ORAL | 0 refills | Status: AC

## 2019-12-11 MED ORDER — CYCLOBENZAPRINE HCL 10 MG PO TABS: 10.0000 mg | ORAL_TABLET | Freq: Three times a day (TID) | ORAL | 0 refills | Status: AC | PRN

## 2019-12-11 MED ORDER — POTASSIUM CHLORIDE CRYS ER 20 MEQ PO TBCR
40.0000 meq | EXTENDED_RELEASE_TABLET | Freq: Once | ORAL | Status: AC
  Administered 2019-12-11: 40 meq via ORAL
  Filled 2019-12-11: qty 2

## 2019-12-11 MED FILL — ARIPiprazole 20 MG TABS: 20 | 30 days supply | Qty: 30 | Fill #0

## 2019-12-11 MED FILL — HYDROmorphone HCL 4 MG TABS: 4 | 5 days supply | Qty: 20 | Fill #0

## 2019-12-11 MED FILL — GABAPENTIN 100 MG CAPSULE: 100 | 30 days supply | Qty: 90 | Fill #0

## 2019-12-11 MED FILL — traZODone HCL 100 MG TABS: 100 | 30 days supply | Qty: 30 | Fill #0

## 2019-12-11 MED FILL — CYCLOBENZAPRINE 10 MG TAB: 10 | 10 days supply | Qty: 30 | Fill #0

## 2019-12-11 MED FILL — buPROPion HCL ER (XL) 150 M: 150 | 30 days supply | Qty: 30 | Fill #0

## 2019-12-11 MED FILL — XARELTO 20 MG TABLET: 20 | 30 days supply | Qty: 30 | Fill #0

## 2019-12-11 MED FILL — DOXYCYCLINE HYCLATE 100 MG: 100 | 30 days supply | Qty: 60 | Fill #0

## 2019-12-11 MED FILL — DICLOFENAC SODIUM 1 % GEL: 1 | 13 days supply | Qty: 100 | Fill #0

## 2019-12-11 NOTE — Progress Notes (Signed)
Neurosurgery Service Progress Note  Subjective: No acute events overnight, no new complaints, having some popping sensation on axial rotation, but said this was present prior to the infection as well   Objective: Vitals:   12/10/19 1405 12/10/19 1900 12/11/19 0500 12/11/19 0757  BP: 108/68 128/90 94/74 91/63   Pulse: 98 98 100 97  Resp: 17 17 16 18   Temp: 98.7 F (37.1 C) 98.8 F (37.1 C) 97.7 F (36.5 C) 98.1 F (36.7 C)  TempSrc: Oral Oral Oral Oral  SpO2: 100% 100% 100% 97%  Weight:      Height:       Temp (24hrs), Avg:98.3 F (36.8 C), Min:97.7 F (36.5 C), Max:98.8 F (37.1 C)  CBC Latest Ref Rng & Units 12/11/2019 12/10/2019 12/09/2019  WBC 4.0 - 10.5 K/uL 5.8 8.8 7.5  Hemoglobin 12.0 - 15.0 g/dL 8.2(L) 8.0(L) 9.4(L)  Hematocrit 36.0 - 46.0 % 27.0(L) 26.4(L) 29.9(L)  Platelets 150 - 400 K/uL 339 394 463(H)   BMP Latest Ref Rng & Units 12/11/2019 12/10/2019 12/09/2019  Glucose 70 - 99 mg/dL 12/11/2019) 382(N) 053(Z)  BUN 6 - 20 mg/dL 11 10 8   Creatinine 0.44 - 1.00 mg/dL 767(H 4.19  Sodium 135 - 145 mmol/L 136 137 138  Potassium 3.5 - 5.1 mmol/L 3.3(L) 3.8 4.1  Chloride 98 - 111 mmol/L 104 104 105  CO2 22 - 32 mmol/L 24 26 25   Calcium 8.9 - 10.3 mg/dL 3.79) 0.24) 9.0    Intake/Output Summary (Last 24 hours) at 12/11/2019 0915 Last data filed at 12/10/2019 2300 Gross per 24 hour  Intake 760 ml  Output --  Net 760 ml    Current Facility-Administered Medications:  .  0.9 %  sodium chloride infusion, , Intravenous, PRN, 3.5(H, MD, Stopped at 11/18/19 1950 .  acetaminophen (TYLENOL) tablet 1,000 mg, 1,000 mg, Oral, TID, Hongalgi, Anand D, MD, 1,000 mg at 12/10/19 2205 .  amphetamine-dextroamphetamine (ADDERALL) tablet 30 mg, 30 mg, Oral, BID, Rizwan, Saima, MD, 30 mg at 12/11/19 12/12/19 .  ARIPiprazole (ABILIFY) tablet 20 mg, 20 mg, Oral, Daily, Rizwan, Saima, MD, 20 mg at 12/10/19 1021 .  buPROPion (WELLBUTRIN XL) 24 hr tablet 150 mg, 150 mg, Oral, Daily, Rizwan,  Saima, MD, 150 mg at 12/10/19 1020 .  Chlorhexidine Gluconate Cloth 2 % PADS 6 each, 6 each, Topical, Q0600, 2992, MD, 6 each at 12/11/19 0425 .  cyclobenzaprine (FLEXERIL) tablet 10 mg, 10 mg, Oral, TID PRN, 12/12/19, MD, 10 mg at 12/11/19 0432 .  dextromethorphan-guaiFENesin (MUCINEX DM) 30-600 MG per 12 hr tablet 1 tablet, 1 tablet, Oral, BID, Kyle, Tyrone A, DO, 1 tablet at 12/10/19 2206 .  diclofenac Sodium (VOLTAREN) 1 % topical gel 4 g, 4 g, Topical, QID, Rizwan, Saima, MD, 4 g at 12/09/19 2156 .  feeding supplement (ENSURE ENLIVE) (ENSURE ENLIVE) liquid 237 mL, 237 mL, Oral, BID BM, 2207, MD, 237 mL at 12/02/19 1239 .  ferrous sulfate tablet 325 mg, 325 mg, Oral, Q breakfast, Dahal, Binaya, MD, 325 mg at 12/10/19 1020 .  fluticasone (FLONASE) 50 MCG/ACT nasal spray 1 spray, 1 spray, Each Nare, Daily, Kyle, Tyrone A, DO, 1 spray at 12/10/19 1021 .  folic acid (FOLVITE) tablet 1 mg, 1 mg, Oral, Daily, 12/04/19., MD, 1 mg at 12/10/19 1619 .  gabapentin (NEURONTIN) capsule 100 mg, 100 mg, Oral, TID, Arrien, 12/12/19, MD, 100 mg at 12/10/19 2206 .  HYDROmorphone (DILAUDID) tablet 4 mg, 4  mg, Oral, Q6H PRN, Marylyn Ishihara, Tyrone A, DO, 4 mg at 12/11/19 0432 .  influenza vac split quadrivalent PF (FLUARIX) injection 0.5 mL, 0.5 mL, Intramuscular, Tomorrow-1000, Arrien, Jimmy Picket, MD .  lidocaine (PF) (XYLOCAINE) 1 % injection, , , PRN, Greggory Keen, MD, 5 mL at 11/06/19 1034 .  loratadine (CLARITIN) tablet 10 mg, 10 mg, Oral, Daily, Kyle, Tyrone A, DO, 10 mg at 12/10/19 1021 .  multivitamin with minerals tablet 1 tablet, 1 tablet, Oral, Daily, Judith Part, MD, 1 tablet at 12/10/19 1020 .  nicotine (NICODERM CQ - dosed in mg/24 hours) patch 14 mg, 14 mg, Transdermal, Daily, Rizwan, Saima, MD, 14 mg at 12/03/19 0839 .  ondansetron (ZOFRAN) tablet 4 mg, 4 mg, Oral, Q6H PRN **OR** ondansetron (ZOFRAN) injection 4 mg, 4 mg, Intravenous,  Q6H PRN, Winifred Bodiford A, MD .  polyethylene glycol (MIRALAX / GLYCOLAX) packet 17 g, 17 g, Oral, Daily PRN, Amin, Ankit Chirag, MD .  potassium chloride SA (KLOR-CON) CR tablet 40 mEq, 40 mEq, Oral, Once, Elodia Florence., MD .  rifabutin [facility]) capsule 300 mg, 300 mg, Oral, Daily, Susa Raring, RPH, 300 mg at 12/10/19 2206 .  [COMPLETED] Rivaroxaban (XARELTO) tablet 15 mg, 15 mg, Oral, BID WC, 15 mg at 11/23/19 1727 **FOLLOWED BY** rivaroxaban (XARELTO) tablet 20 mg, 20 mg, Oral, Daily, Wendee Beavers, RPH, 20 mg at 12/10/19 1020 .  senna-docusate (Senokot-S) tablet 1 tablet, 1 tablet, Oral, BID, Florencia Reasons, MD, 1 tablet at 12/03/19 [phone] .  senna-docusate (Senokot-S) tablet 2 tablet, 2 tablet, Oral, QHS PRN, Amin, Ankit Chirag, MD .  sodium chloride (OCEAN) 0.65 % nasal spray 1 spray, 1 spray, Each Nare, PRN, Debbe Odea, MD, 1 spray at 10/26/19 1145 .  sodium chloride flush (NS) 0.9 % injection 10-40 mL, 10-40 mL, Intracatheter, Q12H, Nayeli Calvert, Joyice Faster, MD, 10 mL at 12/09/19 0826 .  sodium chloride flush (NS) 0.9 % injection 10-40 mL, 10-40 mL, Intracatheter, PRN, Judith Part, MD, 10 mL at 12/04/19 0430 .  traZODone (DESYREL) tablet 100 mg, 100 mg, Oral, QHS PRN, Adele Barthel D, MD, 100 mg at 12/10/19 2206 .  vancomycin (VANCOCIN) IVPB 1000 mg/200 mL premix, 1,000 mg, Intravenous, Q12H, Elodia Florence., MD, Last Rate: 200 mL/hr at 12/11/19 0207, 1,000 mg at 12/11/19 0207   Physical Exam: AOx3, PERRL, EOMI, FS, Strength 5/5 x4, SILTx4, no hoffman's Cervical kyphotic deformity  Assessment & Plan: 39 y.o. woman w/ ventral cervical epidural abscess s/p C5-6 ACDF for drainage, neuro intact.  -plain films reviewed, hardware in stable position, stable kyphotic deformity 2/2 partial destruction of infected vertebral bodies. I discussed the above with the patient at length, her family has already found a spine surgeon in [place] where she will follow up. I  explained the kyphosis to her. In the setting of still-healing infection, my recommendation at this time would be to allow her time to recover, heal the infection, and see if her deformity corrects as the bone heals. I explained that fixing that deformity may require repeat surgery in time, but now is not the time to address it, given what a rough course she has had, she is neurologically intact, and it has been very difficult to clear this infection. She will follow up with the spine surgeon, but I told her I am available if she has trouble finding someone or has any concerns or questions.   Joyice Faster Para Cossey  12/11/19 9:15 AM

## 2019-12-11 NOTE — Plan of Care (Signed)
  Problem: Clinical Measurements: Goal: Respiratory complications will improve Outcome: Adequate for Discharge   

## 2019-12-11 NOTE — Progress Notes (Signed)
Pt states her father is flying in from [place] into [place] and will either take her by car or plane to [place] where she will stay with her aunt. She expressed nervousness about leaving [facility]. Has packed her belongings and is prepared to leave.

## 2019-12-11 NOTE — TOC Transition Note (Signed)
Transition of Care [facility]) - CM/SW Discharge Note   Patient Details  Name: Colleen Ramirez MRN: [id number] Date of Birth: 09/29/1980  Transition of Care [facility]) CM/SW Contact:  Epifanio Lesches, RN Phone Number: 12/11/2019, 9:11 AM   Clinical Narrative:  Presented  with neck pain/ abscess in epidural space of cervical spine. Hx of IV drug abuse, anxiety, depression, anemia, status post gastric bypass.        -s/p C5-C6 Anterior Cervical Discectomy and Fusion, 09/29/2019        - hospital course LT IV ABX  Therapy  Pt will transition to [place] with  Dad. Dad is to arrive from [place] and take pt to an aunt in [place]. Pt states mom has arranged primary care and substance abuse counsel in [place].  PCP: Loma Messing,, [place] appointment scheduled for 3/29.  TOC pharmacy to provide pt with Rx meds prior to d/c. Pt without health insurance. Jobless.Limited income. Match letter will be given to to assist with med cost.   Final next level of care: Home/Self Care Barriers to Discharge: No Barriers Identified   Patient Goals and CMS Choice   Discharge Placement   Discharge Plan and Services In-house Referral: Clinical Social Work Discharge Planning Services: CM Consult   Social Determinants of Health (SDOH) Interventions     Readmission Risk Interventions Readmission Risk Prevention Plan 08/25/2019 08/24/2019  Post Dischage Appt - Not Complete  Appt Comments - unknown address at d/c  Medication Screening - Complete  Transportation Screening Complete Complete  PCP or Specialist Appt within 5-7 Days Not Complete -  Not Complete comments got the earliest appointment available at sickle cell clinic -  Home Care Screening Complete -  Medication Review (RN CM) Referral to Pharmacy -

## 2019-12-11 NOTE — Plan of Care (Signed)
  Problem: Nutrition: Goal: Adequate nutrition will be maintained Outcome: Progressing   Problem: Elimination: Goal: Will not experience complications related to bowel motility Outcome: Progressing   Problem: Pain Managment: Goal: General experience of comfort will improve Outcome: Progressing   

## 2019-12-11 NOTE — Progress Notes (Signed)
Discussed AVS including medications & need for follow up appointments with the patient & patient's father and all questioned fully answered.

## 2019-12-11 NOTE — Discharge Summary (Signed)
Physician Discharge Summary  Loris Seelye QQP:[id number] DOB: 06-29-1981 DOA: 09/29/2019  PCP: Patient, No Pcp Per  Admit date: 09/29/2019 Discharge date: 12/11/2019  Time spent: 40 minutes  Recommendations for Outpatient Follow-up:  1. Follow outpatient CBC/CMP 2. Continue doxycycline 100 mg BID x 3 months and rifabutin 300 mg daily x 3 months - follow with infectious disease and neurosurgery for additional recommendations regarding abx going forward 3. Establish care with PCP, infectious disease, neurosurgery, hematology, and pain management in [place] 4. Continue xarelto x 3 months uninterrupted -> after this, with reported hx of antiphospholipid would recommend indefinite anticoagulation and follow up with hematology outpatient 5. Discharged with dilaudid PO -> discussed with father, her aunt plans to manage her meds - would recommend pain management outpatient 6. Follow up regarding hepatitis C outpatient   Discharge Diagnoses:  Principal Problem:   Abscess in epidural space of cervical spine Active Problems:   Depression   Acquired fusion of cervical spine   IVDU (intravenous drug user)   Homeless   S/P cervical discectomy   Anxiety   Status post gastric bypass for obesity   Hepatitis C antibody positive in blood   Acute bilateral thoracic back pain   Body aches   Methamphetamine abuse (HCC)   Pain of right sternoclavicular joint   Discharge Condition: stable  Diet recommendation: heart healthy  Filed Weights   09/29/19 2032  Weight: 49.9 kg    History of present illness:  Patient is Colleen Ramirez 39 year old female with history of IV drug abuse, anxiety, depression, anemia, status post gastric bypass who presented to the emergency department on 1/12 with complaints of 2-day history of sharp neck pain. She was recently hospitalized from 12/20-2/24 for facial cellulitis. MRI cervical spine showed discitis, osteomyelitis at C5-C6 and C6-7. Found to have  prevertebral/retropharyngeal soft tissue phlegmonous inflammation with areas of nonenhancing material consistent with pus. Underwent C5-C6 anterior discectomy, instrumented fusion cultures positive for MRSA. Started on vancomycin, rifampin. Follow-up cervical MRI showed progression of findings consistent with discitis/osteomyelitis involving C4, C5, C6 and C7 vertebrae. Neurosurgery and ID recommended to continue aggressive medical therapy. She was continued on IV antibiotics until March 26 and then transitioned to PO doxycycline and rifabutin with plan for 3 months at discharge.  Her father picked her up on day of discharge and plans to take her to [place] to stay with family.    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 - Pain management w/ oral PRN diluadid; PRN flexeril for spasm - she's been on narcotics pretty much this entire hospitalization for pain.  Discussed with family who plan to help with medication management outpatient (he aunt will administer).  Will plan to continue current dose x5 days outpatient, further management will be needed outpatient.  She'll benefit from pain management follow up. - CM consulted for help with PCP/follow up - family is planning to help with this as she'll be relocating to [place] and living with aunt after discharge - 3/23: previous provider spoke with ID to clarify discharge regimen. Recommendation is doxy 100mg  BID and rifabutin300mg  both for Melvie Paglia minimum of 3 months.  - stable on 3/25 -> c/o neck pain - plain film with postsurgical/degenerative changes - no acute abnormality  History of IV drug abuse - Needs follow up at  pain management clinic/drug rehabilitation  Hepatitis C - likely secondary to  above - Needs to follow-up as an outpatient for hepatitis C management.   Bilateral upper extremity DVT - Ultrasound showed right upper extremity superficial and deep venous thrombosis of cephalic and brachial veins and right superficial vein thrombosis of basilic and cephalic vein.  - continue Xarelto - minimum of 3 months - pt reports hx of antiphospholipid - would continue indefinite anticoagulation and follow with hematology outpatient   Bilateral LE Edema: follow BNP (wnl) and LE [place] (negative for DVT)  Depression/anxiety - continue Adderall, aripiprazole, trazodone, bupropion.  Chronic normocytic anemia  Iron Def Anemia - Iron studies showed iron deficiency; on oral iron replacement - 3/18: Hgb is stable; no evidence of new bleed, monitor - 3/22: remains stable, monitor - labs from 07/25/2019 show folate def, start folate replacement - repeat B12/folate - wnl  Tobacco abuse - nicotine patch - advised against any further drug and tobacco use  Congestion - claritin, mucinex DM, ocean nasal spray - 3/21: add flonase - 3/22: improved congestion - 3/23: resolved  Normocytic anemia - no bleed noted; Hgb stable, monitor  Procedures: C5-C6 Anterior Cervical Discectomy and Instrumented Fusion on 1/12 by neurosurgery  Consultations:  ID  neurosurgery  Discharge Exam: Vitals:   12/11/19 0500 12/11/19 0757  BP: 94/74 91/63  Pulse: 100 97  Resp: 16 18  Temp: 97.7 F (36.5 C) 98.1 F (36.7 C)  SpO2: 100% 97%   Discussed d/c plan with pt and father No new complaints  General: No acute distress. Cardiovascular: Heart sounds show Marcello Tuzzolino regular rate, and rhythm.  Lungs: Clear to auscultation bilaterally . Abdomen: Soft, nontender, nondistended  Neurological: Alert and oriented 3. Moves all extremities 4 . Cranial nerves II through XII grossly intact. Skin: Warm and dry. No rashes or lesions. Extremities: No  clubbing or cyanosis. Trace edema.   Discharge Instructions   Discharge Instructions    Call MD for:  difficulty breathing, headache or visual disturbances   Complete by: As directed    Call MD for:  extreme fatigue   Complete by: As directed    Call MD for:  hives   Complete by: As directed    Call MD for:  persistant dizziness or light-headedness   Complete by: As directed    Call MD for:  persistant nausea and vomiting   Complete by: As directed    Call MD for:  redness, tenderness, or signs of infection (pain, swelling, redness, odor or green/yellow discharge around incision site)   Complete by: As directed    Call MD for:  severe uncontrolled pain   Complete by: As directed    Call MD for:  temperature >100.4   Complete by: As directed    Diet - low sodium heart healthy   Complete by: As directed    Discharge instructions   Complete by: As directed    You were seen for Bilbo Carcamo cervical spine epidural abscess with vertebral osteomyelitis.  You had Livier Hendel C4-7 fusion with neurosurgery as well as Patryce Depriest prolonged course of IV antibiotics.   We're discharging you on doxycylcine 100 mg twice daily and rifabutin 300 mg daily for at least 3 months.  It's extremely important that you follow up with infectious disease, neurosurgery, and your PCP outpatient.  You should have intermittent labs (CBC and CMP) while you're on these antibiotics.  We could only send you with 7 days of the rifabutin, but I wrote Arda Keadle paper prescription  for the other 83 days.  Please pick these pills up as soon as possible.  You have bilateral upper extremity DVT's.  You reported to me Mischell Branford history of antiphospholipid and lupus.  You should be on anticoagulation (xarelto) for at least 3 months without interruption.  After this, with your history of antiphospholipid, I think indefinite anticoagulation or until risk outweighs benefit and hematology follow up should be considered.  For your pain management, you will probably need to see Tiara Maultsby  pain specialist as an outpatient.  We've prescribed you opiates for 5 days worth.  You'll need to establish with another provider to continue this.  Long term the goal should be to decrease the dose of these medications.  We've also prescribed gabapentin, flexeril, and voltaren to use additionally.  Return for new, recurrent, or worsening symptoms.  Please follow up your hepatitis C infection with infectious disease.  Please ask your PCP to request records from this hospitalization so they know what was done and what the next steps will be.   Increase activity slowly   Complete by: As directed      Allergies as of 12/11/2019   No Known Allergies     Medication List    STOP taking these medications   ALPRAZolam 1 MG tablet Commonly known as: XANAX   diazepam 5 MG tablet Commonly known as: VALIUM     TAKE these medications   amphetamine-dextroamphetamine 20 MG tablet Commonly known as: ADDERALL Take 20 mg by mouth daily.   ARIPiprazole 20 MG tablet Commonly known as: ABILIFY Take 1 tablet (20 mg total) by mouth daily.   buPROPion 150 MG 24 hr tablet Commonly known as: WELLBUTRIN XL Take 1 tablet (150 mg total) by mouth daily.   cyclobenzaprine 10 MG tablet Commonly known as: FLEXERIL Take 1 tablet (10 mg total) by mouth 3 (three) times daily as needed for muscle spasms.   diclofenac Sodium 1 % Gel Commonly known as: VOLTAREN Apply 4 g topically 4 (four) times daily as needed.   doxycycline 100 MG tablet Commonly known as: ADOXA Take 1 tablet (100 mg total) by mouth 2 (two) times daily.   ferrous sulfate 325 (65 FE) MG tablet Take 1 tablet (325 mg total) by mouth daily with breakfast. Start taking on: December 12, 2019   gabapentin 100 MG capsule Commonly known as: NEURONTIN Take 1 capsule (100 mg total) by mouth 3 (three) times daily.   HYDROmorphone 4 MG tablet Commonly known as: DILAUDID Take 1 tablet (4 mg total) by mouth every 6 (six) hours as needed for up to  5 days for severe pain.   Melatonin 10 MG Subl Place 10 mg under the tongue at bedtime as needed (sleep).   multivitamin with minerals Tabs tablet Take 1 tablet by mouth daily. Start taking on: December 12, 2019   nicotine 14 mg/24hr patch Commonly known as: NICODERM CQ - dosed in mg/24 hours Place 1 patch (14 mg total) onto the skin daily. Start taking on: December 12, 2019   polyethylene glycol 17 g packet Commonly known as: MIRALAX / GLYCOLAX Take 17 g by mouth daily as needed for moderate constipation or severe constipation.   rifabutin 150 MG capsule Commonly known as: MYCOBUTIN Take 2 capsules (300 mg total) by mouth daily.   rivaroxaban 20 MG Tabs tablet Commonly known as: XARELTO Take 1 tablet (20 mg total) by mouth daily. Please follow up with your PCP for refills. Start taking on: December 12, 2019  traZODone 100 MG tablet Commonly known as: DESYREL Take 1 tablet (100 mg total) by mouth at bedtime as needed for sleep.      No Known Allergies Follow-up Information    Schedule an appointment as soon as possible for Braidyn Peace visit  with [facility].   Contact information: [address] [place] [phone]           The results of significant diagnostics from this hospitalization (including imaging, microbiology, ancillary and laboratory) are listed below for reference.    Significant Diagnostic Studies: DG Cervical Spine 2 or 3 views  Result Date: 12/10/2019 CLINICAL DATA:  Neck pain. EXAM: CERVICAL SPINE - 2-3 VIEW COMPARISON:  None. FINDINGS: Status post surgical anterior fusion of C5-6. Reversal of normal lordosis of cervical spine is noted. Severe degenerative disc disease is noted at C6-7. Minimal grade 1 retrolisthesis of C6-7 is noted. No definite fracture is noted. IMPRESSION: Postsurgical and degenerative changes as described above. No acute abnormality seen in the cervical spine. Electronically  Signed   By: Lupita RaiderJames  Green Jr M.D.   On: 12/10/2019 16:10   MR CERVICAL SPINE W WO CONTRAST  Result Date: 12/04/2019 CLINICAL DATA:  Osteomyelitis, follow-up EXAM: MRI CERVICAL SPINE WITHOUT AND WITH CONTRAST TECHNIQUE: Multiplanar and multiecho pulse sequences of the cervical spine, to include the craniocervical junction and cervicothoracic junction, were obtained without and with intravenous contrast. CONTRAST:  5mL GADAVIST GADOBUTROL 1 MMOL/ML IV SOLN COMPARISON:  11/16/2019 FINDINGS: Alignment: Nonspecific change with persistent focal kyphosis at the C4-C6 levels Vertebrae: Anterior fusion is again identified at C5-C6. The hardware is not well evaluated on this study and there is associated susceptibility artifact. Abnormal marrow signal persists at C4-C7 but enhancement post does appear decreased. There is persistent edema or enhancement associated with the bilateral C2-C3 facets, right C3-C4 facet joint, and left C7-T1 facet joint with some interval improvement. Ventral epidural enhancement at these levels appears decreased. Persistent dorsal epidural STIR hyperintensity and enhancement. Cord: No abnormal cord signal. Posterior Fossa, vertebral arteries, paraspinal tissues: Persistent prevertebral edema with decreased enhancement. Disc levels: Degree of spinal canal narrowing is unchanged. No cord compression. IMPRESSION: Persistent findings of discitis/osteomyelitis and presumed facet joint septic arthritis. Ventral and dorsal epidural as well as prevertebral inflammatory change also remains. However, there has been some improvement since the 05/17/2020 study primarily evidenced by decreased enhancement. Electronically Signed   By: Guadlupe SpanishPraneil  Patel M.D.   On: 12/04/2019 17:11   MR CERVICAL SPINE W WO CONTRAST  Result Date: 11/16/2019 CLINICAL DATA:  Osteomyelitis, follow-up EXAM: MRI CERVICAL SPINE WITHOUT AND WITH CONTRAST TECHNIQUE: Multiplanar and multiecho pulse sequences of the cervical spine, to  include the craniocervical junction and cervicothoracic junction, were obtained without and with intravenous contrast. CONTRAST:  5mL GADAVIST GADOBUTROL 1 MMOL/ML IV SOLN COMPARISON:  11/01/2019 FINDINGS: Alignment: Stable. Vertebrae: Anterior fusion is again identified at C5-C6. The hardware is not well evaluated on this study and there is associated susceptibility artifact. There is persistent abnormal marrow signal and enhancement at C4-C7. Marrow signal enhancement are also again present at the bilateral C2-C3 facet joints as well as the right C3-C4 and left C7-T1 facet joints. Ventral epidural enhancement at these levels is not substantially changed. Persistent dorsal epidural STIR hyperintensity and enhancement. Cord: No abnormal cord signal. Posterior Fossa, vertebral arteries, paraspinal tissues: Persistent prevertebral edema and enhancement eccentric to the left. Disc levels: Degree of spinal canal narrowing is unchanged. There is no cord compression.  IMPRESSION: Findings are overall similar to the 11/01/2019 study. Persistent evidence of discitis/osteomyelitis at C4-C7 as well as presumed septic arthritis bilateral C2-C3, right C3-C4, and left C7-T1 facet joints. Ventral and dorsal epidural as well as prevertebral phlegmon is not substantially changed. No progression of canal narrowing or cord compression. Electronically Signed   By: Guadlupe Spanish M.D.   On: 11/16/2019 19:51   VAS [place] LOWER EXTREMITY VENOUS (DVT)  Result Date: 12/09/2019  Lower Venous DVTStudy Indications: Swelling.  Comparison Study: Bilateral upper extremity venous 10-31-19 and 11-02-19,                   positive. Performing Technologist: Kennedy Bucker ARDMS, RVT  Examination Guidelines: Akisha Sturgill complete evaluation includes B-mode imaging, spectral Doppler, color Doppler, and power Doppler as needed of all accessible portions of each vessel. Bilateral testing is considered an integral part of Luvena Wentling complete examination. Limited examinations  for reoccurring indications may be performed as noted. The reflux portion of the exam is performed with the patient in reverse Trendelenburg.  +---------+---------------+---------+-----------+----------+--------------+ RIGHT    CompressibilityPhasicitySpontaneityPropertiesThrombus Aging +---------+---------------+---------+-----------+----------+--------------+ CFV      Full           Yes      Yes                                 +---------+---------------+---------+-----------+----------+--------------+ SFJ      Full                                                        +---------+---------------+---------+-----------+----------+--------------+ FV Prox  Full                                                        +---------+---------------+---------+-----------+----------+--------------+ FV Mid   Full                                                        +---------+---------------+---------+-----------+----------+--------------+ FV DistalFull                                                        +---------+---------------+---------+-----------+----------+--------------+ PFV      Full                                                        +---------+---------------+---------+-----------+----------+--------------+ POP      Full           Yes      Yes                                 +---------+---------------+---------+-----------+----------+--------------+  PTV      Full                                                        +---------+---------------+---------+-----------+----------+--------------+ PERO     Full                                                        +---------+---------------+---------+-----------+----------+--------------+   +---------+---------------+---------+-----------+----------+--------------+ LEFT     CompressibilityPhasicitySpontaneityPropertiesThrombus Aging  +---------+---------------+---------+-----------+----------+--------------+ CFV      Full           Yes      Yes                                 +---------+---------------+---------+-----------+----------+--------------+ SFJ      Full                                                        +---------+---------------+---------+-----------+----------+--------------+ FV Prox  Full                                                        +---------+---------------+---------+-----------+----------+--------------+ FV Mid   Full                                                        +---------+---------------+---------+-----------+----------+--------------+ FV DistalFull                                                        +---------+---------------+---------+-----------+----------+--------------+ PFV      Full                                                        +---------+---------------+---------+-----------+----------+--------------+ POP      Full           Yes      Yes                                 +---------+---------------+---------+-----------+----------+--------------+ PTV      Full                                                        +---------+---------------+---------+-----------+----------+--------------+  PERO     Full                                                        +---------+---------------+---------+-----------+----------+--------------+     Summary: BILATERAL: - No evidence of deep vein thrombosis seen in the lower extremities, bilaterally.  RIGHT: - No cystic structure found in the popliteal fossa.  LEFT: - No cystic structure found in the popliteal fossa.  *See table(s) above for measurements and observations. Electronically signed by Fabienne Bruns MD on 12/09/2019 at 4:00:02 PM.    Final     Microbiology: No results found for this or any previous visit (from the past 240 hour(s)).   Labs: Basic Metabolic Panel: Recent  Labs  Lab 12/07/19 0505 12/08/19 0342 12/09/19 0500 12/10/19 0420 12/11/19 0417  NA 141 139 138 137 136  K 3.9 3.9 4.1 3.8 3.3*  CL 107 105 105 104 104  CO2 26 26 25 26 24   GLUCOSE 113* 147* 111* 123* 135*  BUN 10 10 8 10 11   CREATININE 0.59 0.67 0.63 0.58 0.69  CALCIUM 9.0 9.0 9.0 8.7* 8.8*  MG 1.8 1.8 2.0 1.8 1.8  PHOS  --   --  4.2 4.0 3.5   Liver Function Tests: Recent Labs  Lab 12/06/19 0343 12/06/19 0343 12/07/19 0505 12/08/19 0342 12/09/19 0500 12/10/19 0420 12/11/19 0417  AST 23  --  21 22  --  19 17  ALT 22  --  20 22  --  17 16  ALKPHOS 79  --  82 85  --  75 72  BILITOT 0.6  --  0.4 0.5  --  0.5 0.3  PROT 6.6  --  6.7 7.2  --  6.3* 6.4*  ALBUMIN 2.5*   < > 2.6* 2.9* 3.1* 2.5* 2.6*   < > = values in this interval not displayed.   No results for input(s): LIPASE, AMYLASE in the last 168 hours. No results for input(s): AMMONIA in the last 168 hours. CBC: Recent Labs  Lab 12/07/19 0505 12/08/19 0342 12/09/19 0500 12/10/19 0420 12/11/19 0417  WBC 5.4 6.6 7.5 8.8 5.8  NEUTROABS 2.9 4.0 4.8 6.7 3.7  HGB 8.2* 8.9* 9.4* 8.0* 8.2*  HCT 26.4* 28.7* 29.9* 26.4* 27.0*  MCV 83.8 83.2 82.1 84.9 84.9  PLT 417* 407* 463* 394 339   Cardiac Enzymes: No results for input(s): CKTOTAL, CKMB, CKMBINDEX, TROPONINI in the last 168 hours. BNP: BNP (last 3 results) Recent Labs    12/09/19 0500  BNP 36.7    ProBNP (last 3 results) No results for input(s): PROBNP in the last 8760 hours.  CBG: No results for input(s): GLUCAP in the last 168 hours.     Signed:  12/13/19 MD.  [facility] 12/11/2019, 8:15 PM

## 2021-01-03 IMAGING — MR MR CERVICAL SPINE WO/W CM
6 of 8 series · 31 of 48 positions shown · IV contrast (5ML GADAVIST)
Comparison: 11/16/2019

CLINICAL DATA: Osteomyelitis, follow-up

EXAM:
MRI CERVICAL SPINE WITHOUT AND WITH CONTRAST
TECHNIQUE: Multiplanar and multiecho pulse sequences of the cervical spine, to
include the craniocervical junction and cervicothoracic junction,
were obtained without and with intravenous contrast.
CONTRAST:  5mL GADAVIST GADOBUTROL 1 MMOL/ML IV SOLN

[Series 5: T2 · sagittal · 3.0mm · 0.69mm/px · 4 of 15 slices shown (1 of 2)]
[im 1/15]
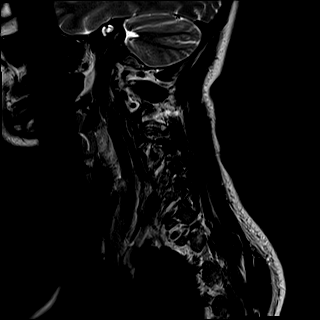
[im 5/15]
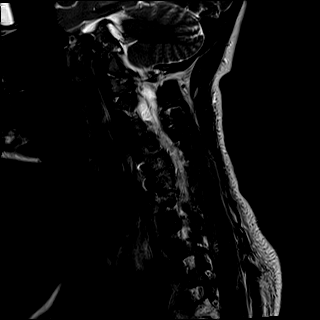
[im 10/15]
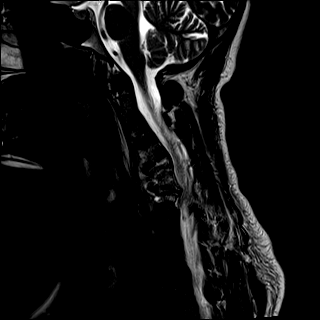
[im 15/15]
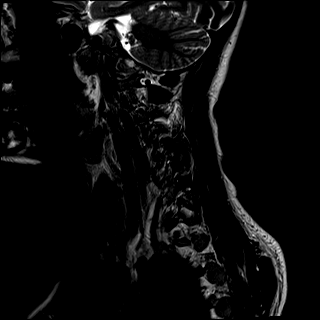

[Series 6: T1 · sagittal · 3.0mm · 0.69mm/px · 4 of 15 slices shown (1 of 2)]
[im 1/15]
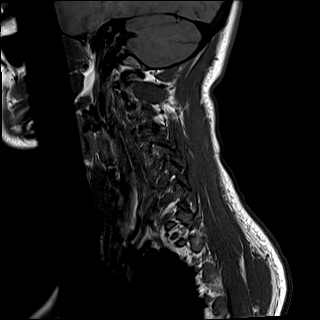
[im 5/15]
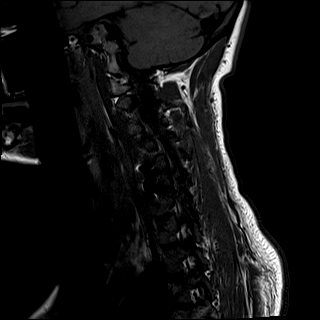
[im 10/15]
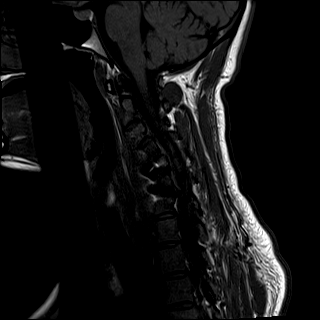
[im 15/15]
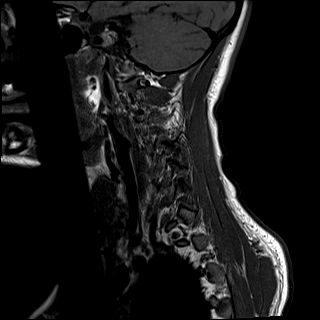

[Series 7: STIR · sagittal · 3.0mm · 0.86mm/px · 3 of 15 slices shown]
[im 1/15]
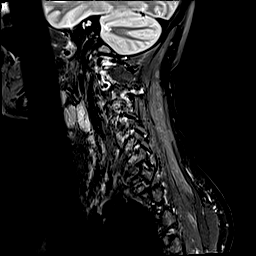
[im 5/15]
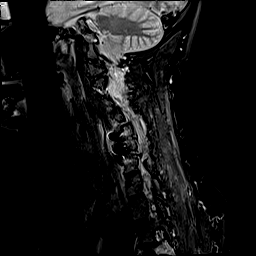
[im 10/15]
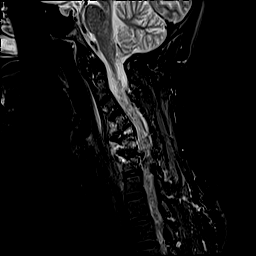

[Series 8: T2 · axial · 3.0mm · 0.66mm/px · z∈[-81,+24]mm · 8 of 36 slices shown (2 of 2)]
[im 1/36]
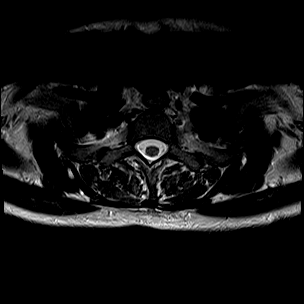
[im 6/36]
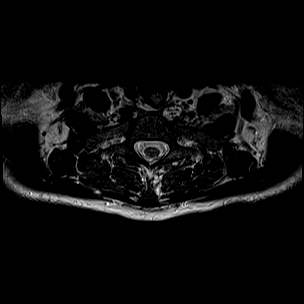
[im 11/36]
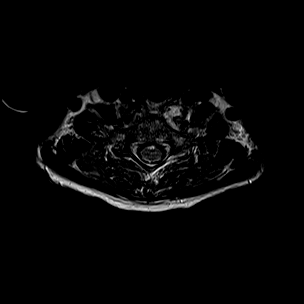
[im 16/36]
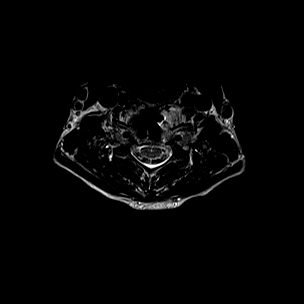
[im 21/36]
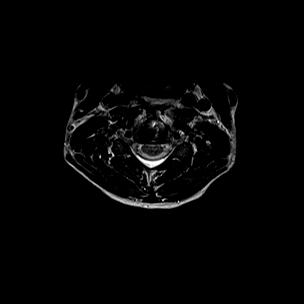
[im 26/36]
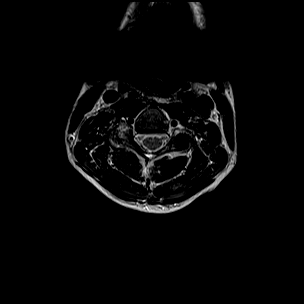
[im 31/36]
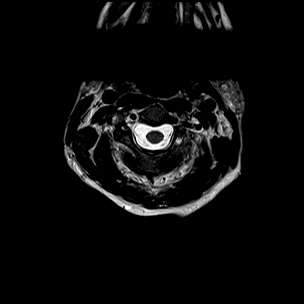
[im 36/36]
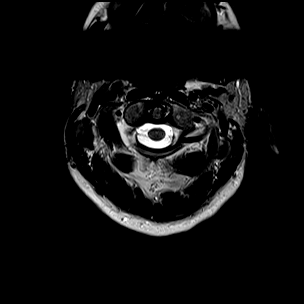

[Series 10: T1 · axial · 3.0mm · 0.39mm/px · z∈[-81,+24]mm · 8 of 36 slices shown (2 of 2)]
[im 1/36]
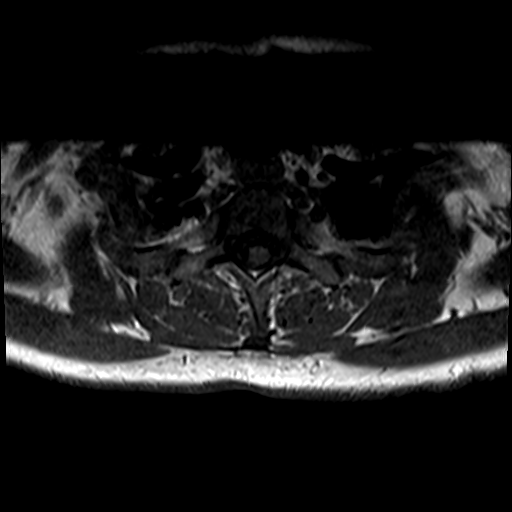
[im 6/36]
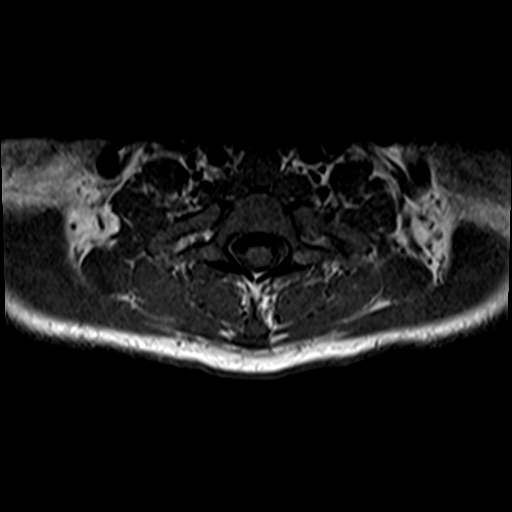
[im 11/36]
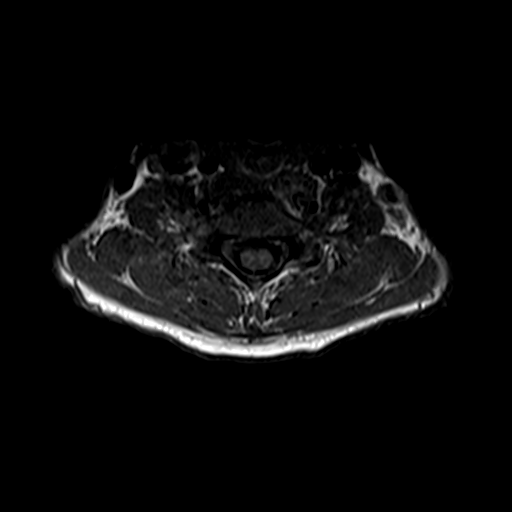
[im 16/36]
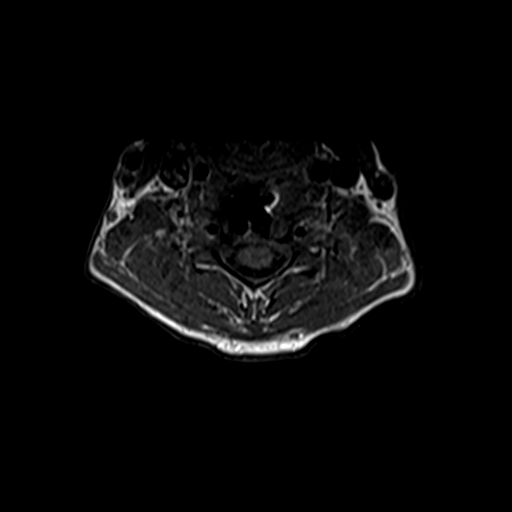
[im 21/36]
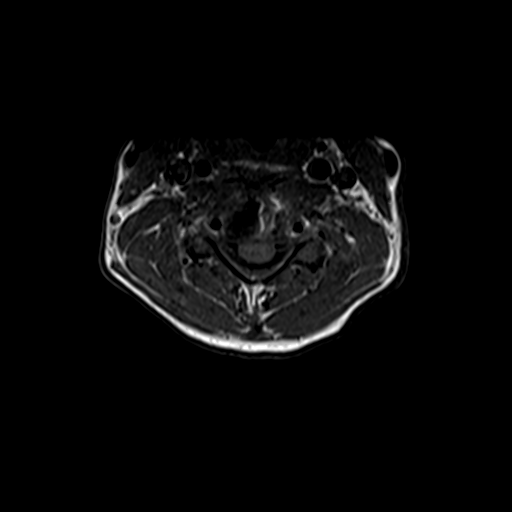
[im 26/36]
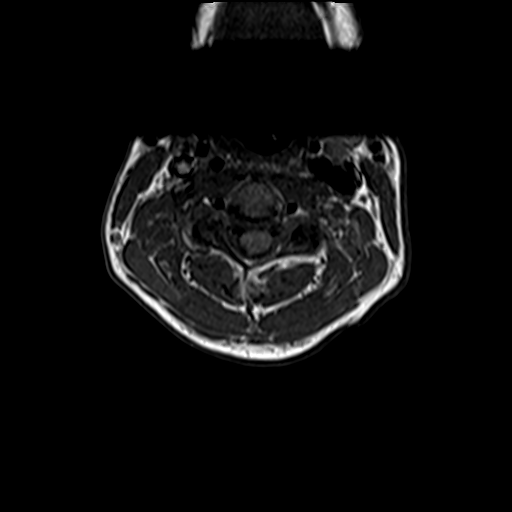
[im 31/36]
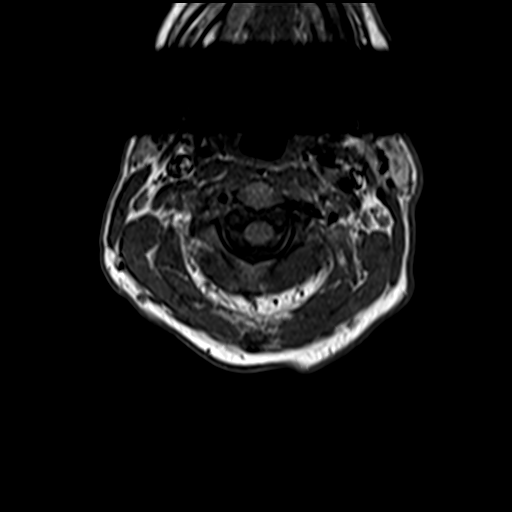
[im 36/36]
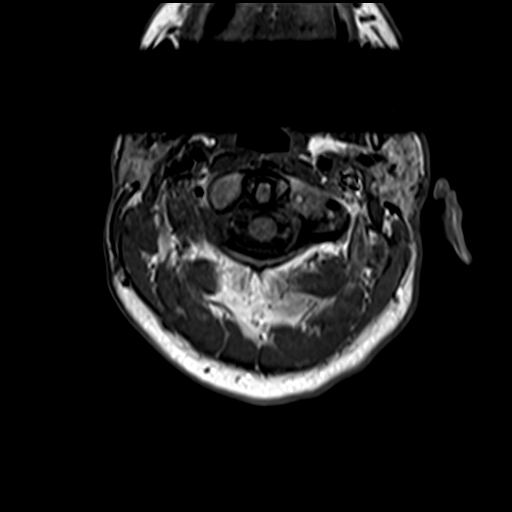

[Series 11: T1 fat-sat post-contrast · sagittal · 3.0mm · 0.43mm/px · 4 of 15 slices shown]
[im 1/15]
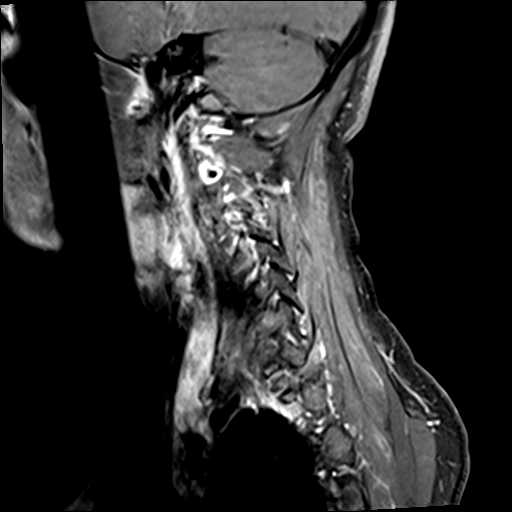
[im 5/15]
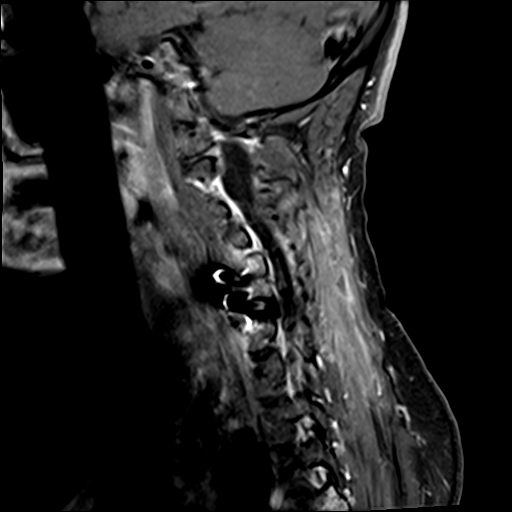
[im 10/15]
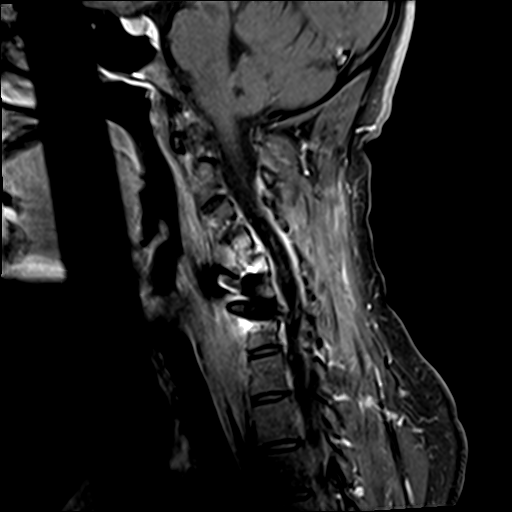
[im 15/15]
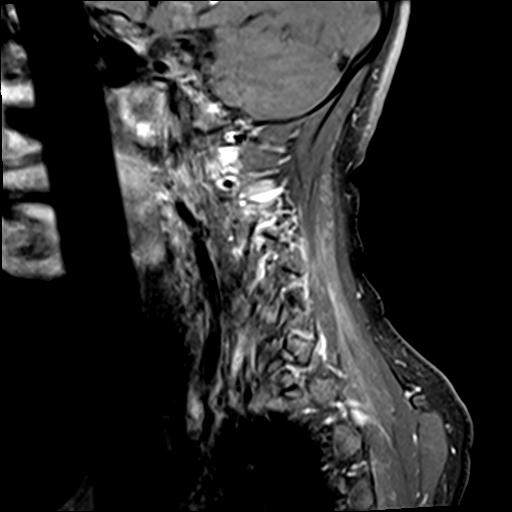

[31 of 48 positions shown; findings below may reference images not displayed]

FINDINGS: Alignment: Nonspecific change with persistent focal kyphosis at the
C4-C6 levels

Vertebrae: Anterior fusion is again identified at C5-C6. The
hardware is not well evaluated on this study and there is associated
susceptibility artifact. Abnormal marrow signal persists at C4-C7
but enhancement post does appear decreased. There is persistent
edema or enhancement associated with the bilateral C2-C3 facets,
right C3-C4 facet joint, and left C7-T1 facet joint with some
interval improvement.

Ventral epidural enhancement at these levels appears decreased.
Persistent dorsal epidural STIR hyperintensity and enhancement.

Cord: No abnormal cord signal.

Posterior Fossa, vertebral arteries, paraspinal tissues: Persistent
prevertebral edema with decreased enhancement.

Disc levels: Degree of spinal canal narrowing is unchanged. No cord
compression.
IMPRESSION: Persistent findings of discitis/osteomyelitis and presumed facet
joint septic arthritis. Ventral and dorsal epidural as well as
prevertebral inflammatory change also remains. However, there has
been some improvement since the 05/17/2020 study primarily evidenced
by decreased enhancement.

## 2021-01-09 IMAGING — CR DG CERVICAL SPINE 2 OR 3 VIEWS
2 series · 2 of 2 positions shown · non-contrast
Comparison: None.

CLINICAL DATA: Neck pain.

EXAM:
CERVICAL SPINE - 2-3 VIEW

[c-spine lat]
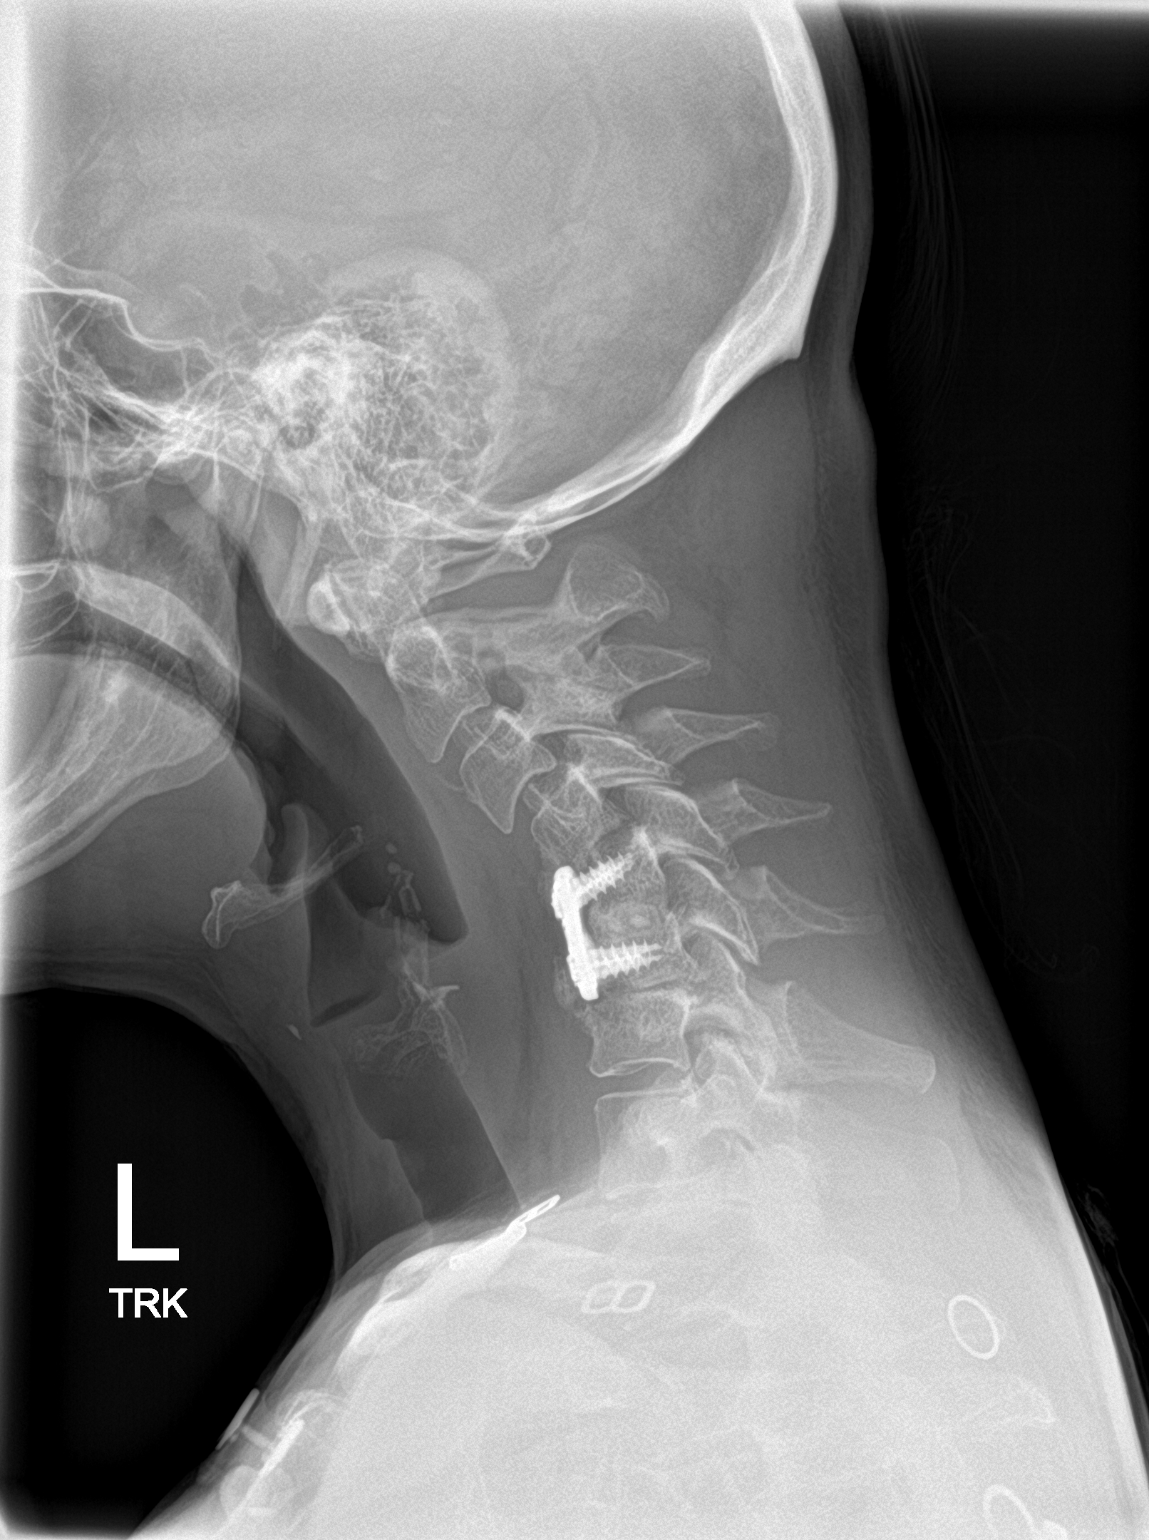

[c-spine ap]
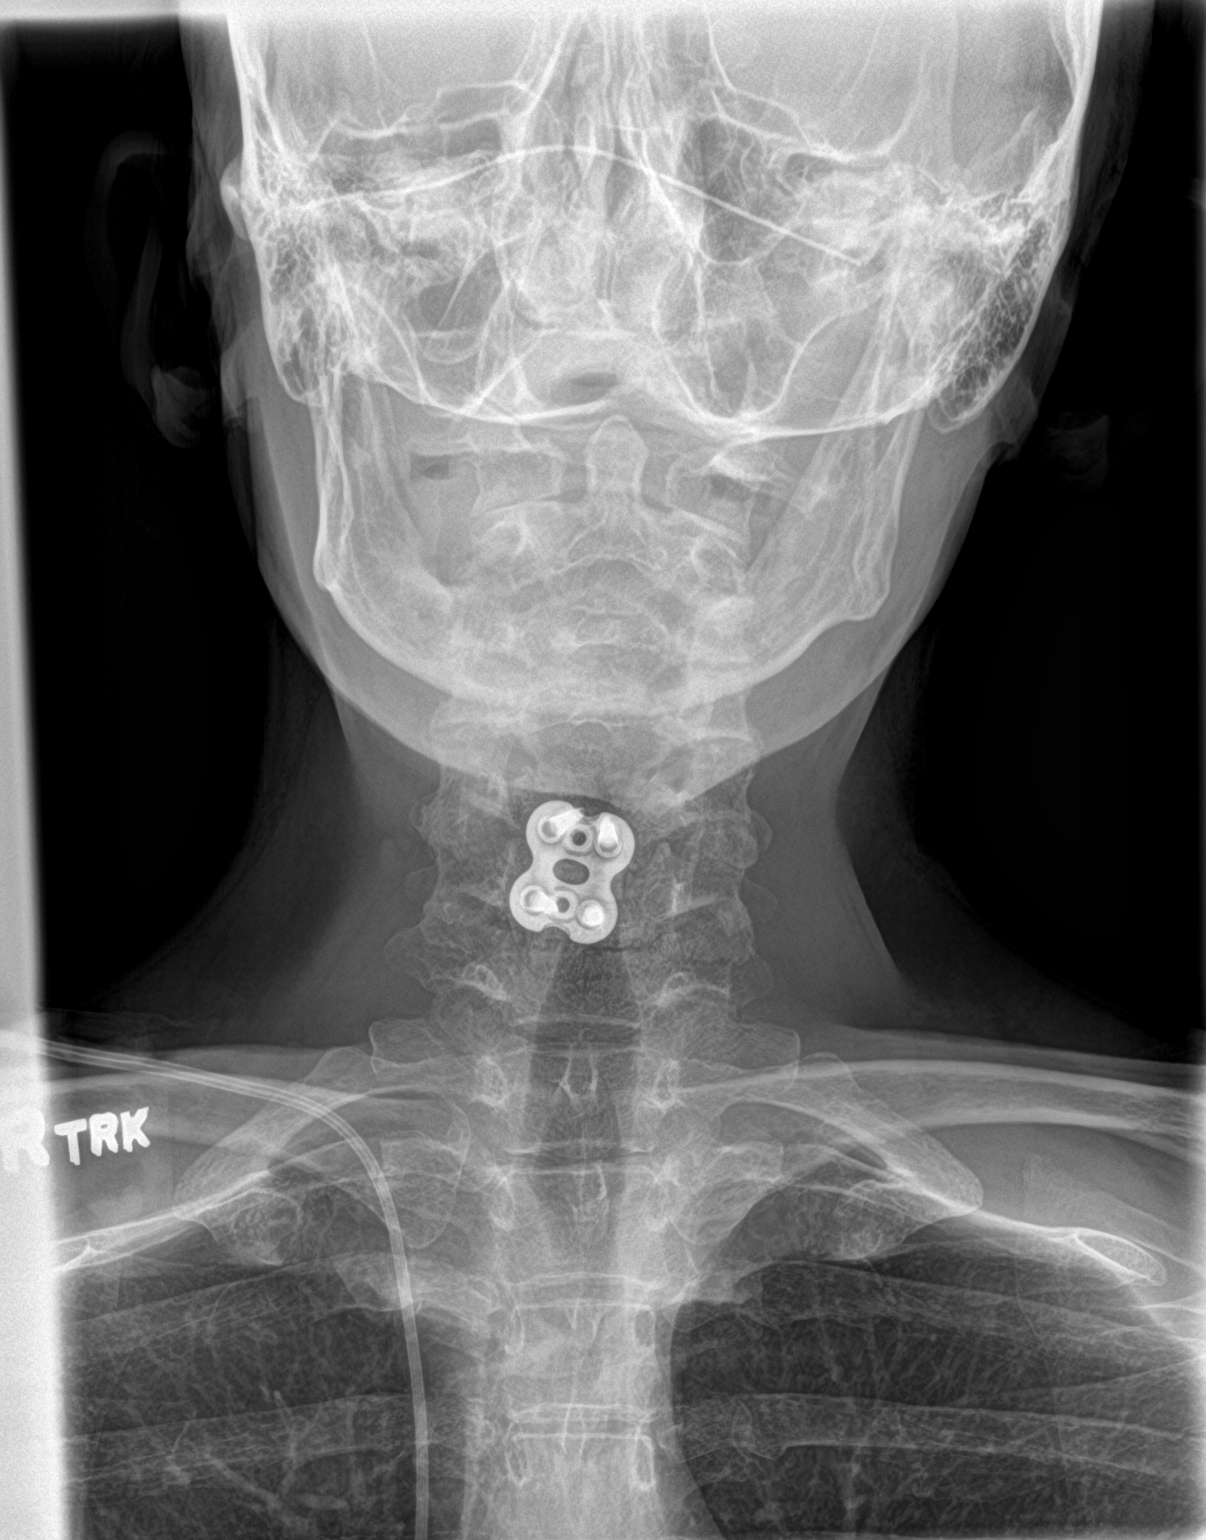

[2 of 2 positions shown; findings below may reference images not displayed]

FINDINGS: Status post surgical anterior fusion of C5-6. Reversal of normal
lordosis of cervical spine is noted. Severe degenerative disc
disease is noted at C6-7. Minimal grade 1 retrolisthesis of C6-7 is
noted. No definite fracture is noted.
IMPRESSION: Postsurgical and degenerative changes as described above. No acute
abnormality seen in the cervical spine.
